# Patient Record
Sex: Male | Born: 1955 | Race: Black or African American | Hispanic: No | Marital: Single | Smoking: Current every day smoker
Health system: Southern US, Community
[De-identification: ages and names within clinical notes are randomized; demographics above are authoritative.]

## PROBLEM LIST (undated history)

## (undated) DIAGNOSIS — E119 Type 2 diabetes mellitus without complications: Secondary | ICD-10-CM

## (undated) DIAGNOSIS — N186 End stage renal disease: Secondary | ICD-10-CM

## (undated) DIAGNOSIS — I10 Essential (primary) hypertension: Secondary | ICD-10-CM

## (undated) DIAGNOSIS — N289 Disorder of kidney and ureter, unspecified: Secondary | ICD-10-CM

## (undated) HISTORY — PX: BACK SURGERY: SHX140

## (undated) HISTORY — PX: BELOW KNEE LEG AMPUTATION: SUR23

---

## 2019-01-07 DIAGNOSIS — M4624 Osteomyelitis of vertebra, thoracic region: Secondary | ICD-10-CM | POA: Diagnosis not present

## 2019-01-14 DIAGNOSIS — M4624 Osteomyelitis of vertebra, thoracic region: Secondary | ICD-10-CM | POA: Diagnosis not present

## 2019-01-21 DIAGNOSIS — M4624 Osteomyelitis of vertebra, thoracic region: Secondary | ICD-10-CM | POA: Diagnosis not present

## 2019-01-28 DIAGNOSIS — M4624 Osteomyelitis of vertebra, thoracic region: Secondary | ICD-10-CM | POA: Diagnosis not present

## 2019-01-30 DIAGNOSIS — M4624 Osteomyelitis of vertebra, thoracic region: Secondary | ICD-10-CM | POA: Diagnosis not present

## 2019-02-04 DIAGNOSIS — M4624 Osteomyelitis of vertebra, thoracic region: Secondary | ICD-10-CM | POA: Diagnosis not present

## 2019-02-11 DIAGNOSIS — M4624 Osteomyelitis of vertebra, thoracic region: Secondary | ICD-10-CM | POA: Diagnosis not present

## 2019-02-18 DIAGNOSIS — M4624 Osteomyelitis of vertebra, thoracic region: Secondary | ICD-10-CM | POA: Diagnosis not present

## 2019-02-25 DIAGNOSIS — M4624 Osteomyelitis of vertebra, thoracic region: Secondary | ICD-10-CM | POA: Diagnosis not present

## 2019-03-01 DIAGNOSIS — M4624 Osteomyelitis of vertebra, thoracic region: Secondary | ICD-10-CM | POA: Diagnosis not present

## 2019-03-11 DIAGNOSIS — M4624 Osteomyelitis of vertebra, thoracic region: Secondary | ICD-10-CM | POA: Diagnosis not present

## 2019-03-18 DIAGNOSIS — M4624 Osteomyelitis of vertebra, thoracic region: Secondary | ICD-10-CM | POA: Diagnosis not present

## 2019-03-25 DIAGNOSIS — M4624 Osteomyelitis of vertebra, thoracic region: Secondary | ICD-10-CM | POA: Diagnosis not present

## 2019-03-31 DIAGNOSIS — M4624 Osteomyelitis of vertebra, thoracic region: Secondary | ICD-10-CM | POA: Diagnosis not present

## 2019-04-06 DIAGNOSIS — M4624 Osteomyelitis of vertebra, thoracic region: Secondary | ICD-10-CM | POA: Diagnosis not present

## 2019-04-13 DIAGNOSIS — M4624 Osteomyelitis of vertebra, thoracic region: Secondary | ICD-10-CM | POA: Diagnosis not present

## 2019-04-22 DIAGNOSIS — M4624 Osteomyelitis of vertebra, thoracic region: Secondary | ICD-10-CM | POA: Diagnosis not present

## 2019-04-29 DIAGNOSIS — M4624 Osteomyelitis of vertebra, thoracic region: Secondary | ICD-10-CM | POA: Diagnosis not present

## 2019-05-06 DIAGNOSIS — M4624 Osteomyelitis of vertebra, thoracic region: Secondary | ICD-10-CM | POA: Diagnosis not present

## 2019-05-13 DIAGNOSIS — M4624 Osteomyelitis of vertebra, thoracic region: Secondary | ICD-10-CM | POA: Diagnosis not present

## 2019-05-20 DIAGNOSIS — M4624 Osteomyelitis of vertebra, thoracic region: Secondary | ICD-10-CM | POA: Diagnosis not present

## 2019-05-27 DIAGNOSIS — M4624 Osteomyelitis of vertebra, thoracic region: Secondary | ICD-10-CM | POA: Diagnosis not present

## 2019-05-31 DIAGNOSIS — M4624 Osteomyelitis of vertebra, thoracic region: Secondary | ICD-10-CM | POA: Diagnosis not present

## 2019-06-10 DIAGNOSIS — M4624 Osteomyelitis of vertebra, thoracic region: Secondary | ICD-10-CM | POA: Diagnosis not present

## 2019-06-17 DIAGNOSIS — M4624 Osteomyelitis of vertebra, thoracic region: Secondary | ICD-10-CM | POA: Diagnosis not present

## 2019-06-24 DIAGNOSIS — M4624 Osteomyelitis of vertebra, thoracic region: Secondary | ICD-10-CM | POA: Diagnosis not present

## 2019-08-13 DIAGNOSIS — Z4781 Encounter for orthopedic aftercare following surgical amputation: Secondary | ICD-10-CM | POA: Diagnosis not present

## 2019-08-19 DIAGNOSIS — Z4781 Encounter for orthopedic aftercare following surgical amputation: Secondary | ICD-10-CM | POA: Diagnosis not present

## 2019-08-26 DIAGNOSIS — Z4781 Encounter for orthopedic aftercare following surgical amputation: Secondary | ICD-10-CM | POA: Diagnosis not present

## 2019-08-31 DIAGNOSIS — Z4781 Encounter for orthopedic aftercare following surgical amputation: Secondary | ICD-10-CM | POA: Diagnosis not present

## 2019-09-09 DIAGNOSIS — Z4781 Encounter for orthopedic aftercare following surgical amputation: Secondary | ICD-10-CM | POA: Diagnosis not present

## 2019-09-16 DIAGNOSIS — Z4781 Encounter for orthopedic aftercare following surgical amputation: Secondary | ICD-10-CM | POA: Diagnosis not present

## 2019-09-23 DIAGNOSIS — Z4781 Encounter for orthopedic aftercare following surgical amputation: Secondary | ICD-10-CM | POA: Diagnosis not present

## 2019-09-30 DIAGNOSIS — Z4781 Encounter for orthopedic aftercare following surgical amputation: Secondary | ICD-10-CM | POA: Diagnosis not present

## 2019-10-07 DIAGNOSIS — Z4781 Encounter for orthopedic aftercare following surgical amputation: Secondary | ICD-10-CM | POA: Diagnosis not present

## 2019-10-14 DIAGNOSIS — Z4781 Encounter for orthopedic aftercare following surgical amputation: Secondary | ICD-10-CM | POA: Diagnosis not present

## 2019-10-21 DIAGNOSIS — Z4781 Encounter for orthopedic aftercare following surgical amputation: Secondary | ICD-10-CM | POA: Diagnosis not present

## 2019-10-28 DIAGNOSIS — Z4781 Encounter for orthopedic aftercare following surgical amputation: Secondary | ICD-10-CM | POA: Diagnosis not present

## 2019-10-31 DIAGNOSIS — Z4781 Encounter for orthopedic aftercare following surgical amputation: Secondary | ICD-10-CM | POA: Diagnosis not present

## 2019-11-04 DIAGNOSIS — Z4781 Encounter for orthopedic aftercare following surgical amputation: Secondary | ICD-10-CM | POA: Diagnosis not present

## 2019-11-11 DIAGNOSIS — Z4781 Encounter for orthopedic aftercare following surgical amputation: Secondary | ICD-10-CM | POA: Diagnosis not present

## 2019-12-05 DIAGNOSIS — I429 Cardiomyopathy, unspecified: Secondary | ICD-10-CM | POA: Diagnosis not present

## 2019-12-09 DIAGNOSIS — I429 Cardiomyopathy, unspecified: Secondary | ICD-10-CM | POA: Diagnosis not present

## 2019-12-16 DIAGNOSIS — I429 Cardiomyopathy, unspecified: Secondary | ICD-10-CM | POA: Diagnosis not present

## 2019-12-23 DIAGNOSIS — I429 Cardiomyopathy, unspecified: Secondary | ICD-10-CM | POA: Diagnosis not present

## 2019-12-23 DIAGNOSIS — J449 Chronic obstructive pulmonary disease, unspecified: Secondary | ICD-10-CM | POA: Diagnosis not present

## 2019-12-23 DIAGNOSIS — M5000 Cervical disc disorder with myelopathy, unspecified cervical region: Secondary | ICD-10-CM | POA: Diagnosis not present

## 2019-12-23 DIAGNOSIS — K920 Hematemesis: Secondary | ICD-10-CM | POA: Diagnosis not present

## 2019-12-23 DIAGNOSIS — K922 Gastrointestinal hemorrhage, unspecified: Secondary | ICD-10-CM | POA: Diagnosis not present

## 2019-12-31 DIAGNOSIS — J449 Chronic obstructive pulmonary disease, unspecified: Secondary | ICD-10-CM | POA: Diagnosis not present

## 2019-12-31 DIAGNOSIS — K920 Hematemesis: Secondary | ICD-10-CM | POA: Diagnosis not present

## 2019-12-31 DIAGNOSIS — K922 Gastrointestinal hemorrhage, unspecified: Secondary | ICD-10-CM | POA: Diagnosis not present

## 2019-12-31 DIAGNOSIS — I429 Cardiomyopathy, unspecified: Secondary | ICD-10-CM | POA: Diagnosis not present

## 2019-12-31 DIAGNOSIS — M5000 Cervical disc disorder with myelopathy, unspecified cervical region: Secondary | ICD-10-CM | POA: Diagnosis not present

## 2020-01-13 DIAGNOSIS — K920 Hematemesis: Secondary | ICD-10-CM | POA: Diagnosis not present

## 2020-01-13 DIAGNOSIS — I429 Cardiomyopathy, unspecified: Secondary | ICD-10-CM | POA: Diagnosis not present

## 2020-01-13 DIAGNOSIS — M5000 Cervical disc disorder with myelopathy, unspecified cervical region: Secondary | ICD-10-CM | POA: Diagnosis not present

## 2020-01-13 DIAGNOSIS — J449 Chronic obstructive pulmonary disease, unspecified: Secondary | ICD-10-CM | POA: Diagnosis not present

## 2020-01-13 DIAGNOSIS — K922 Gastrointestinal hemorrhage, unspecified: Secondary | ICD-10-CM | POA: Diagnosis not present

## 2020-02-10 DIAGNOSIS — J449 Chronic obstructive pulmonary disease, unspecified: Secondary | ICD-10-CM | POA: Diagnosis not present

## 2020-02-10 DIAGNOSIS — M5000 Cervical disc disorder with myelopathy, unspecified cervical region: Secondary | ICD-10-CM | POA: Diagnosis not present

## 2020-02-10 DIAGNOSIS — I429 Cardiomyopathy, unspecified: Secondary | ICD-10-CM | POA: Diagnosis not present

## 2020-02-10 DIAGNOSIS — K920 Hematemesis: Secondary | ICD-10-CM | POA: Diagnosis not present

## 2020-02-10 DIAGNOSIS — K922 Gastrointestinal hemorrhage, unspecified: Secondary | ICD-10-CM | POA: Diagnosis not present

## 2020-02-15 DIAGNOSIS — E1165 Type 2 diabetes mellitus with hyperglycemia: Secondary | ICD-10-CM | POA: Diagnosis not present

## 2020-02-15 DIAGNOSIS — R0689 Other abnormalities of breathing: Secondary | ICD-10-CM | POA: Diagnosis not present

## 2020-02-15 DIAGNOSIS — Z743 Need for continuous supervision: Secondary | ICD-10-CM | POA: Diagnosis not present

## 2020-02-15 DIAGNOSIS — R52 Pain, unspecified: Secondary | ICD-10-CM | POA: Diagnosis not present

## 2020-02-15 DIAGNOSIS — R4020 Unspecified coma: Secondary | ICD-10-CM | POA: Diagnosis not present

## 2020-02-21 DIAGNOSIS — I429 Cardiomyopathy, unspecified: Secondary | ICD-10-CM | POA: Diagnosis not present

## 2020-02-24 DIAGNOSIS — I429 Cardiomyopathy, unspecified: Secondary | ICD-10-CM | POA: Diagnosis not present

## 2020-02-29 DIAGNOSIS — E1142 Type 2 diabetes mellitus with diabetic polyneuropathy: Secondary | ICD-10-CM | POA: Diagnosis not present

## 2020-02-29 DIAGNOSIS — K861 Other chronic pancreatitis: Secondary | ICD-10-CM | POA: Diagnosis not present

## 2020-02-29 DIAGNOSIS — E1169 Type 2 diabetes mellitus with other specified complication: Secondary | ICD-10-CM | POA: Diagnosis not present

## 2020-02-29 DIAGNOSIS — Z992 Dependence on renal dialysis: Secondary | ICD-10-CM | POA: Diagnosis not present

## 2020-02-29 DIAGNOSIS — E1122 Type 2 diabetes mellitus with diabetic chronic kidney disease: Secondary | ICD-10-CM | POA: Diagnosis not present

## 2020-02-29 DIAGNOSIS — I132 Hypertensive heart and chronic kidney disease with heart failure and with stage 5 chronic kidney disease, or end stage renal disease: Secondary | ICD-10-CM | POA: Diagnosis not present

## 2020-02-29 DIAGNOSIS — D631 Anemia in chronic kidney disease: Secondary | ICD-10-CM | POA: Diagnosis not present

## 2020-02-29 DIAGNOSIS — J449 Chronic obstructive pulmonary disease, unspecified: Secondary | ICD-10-CM | POA: Diagnosis not present

## 2020-02-29 DIAGNOSIS — N186 End stage renal disease: Secondary | ICD-10-CM | POA: Diagnosis not present

## 2020-02-29 DIAGNOSIS — L97829 Non-pressure chronic ulcer of other part of left lower leg with unspecified severity: Secondary | ICD-10-CM | POA: Diagnosis not present

## 2020-02-29 DIAGNOSIS — F17210 Nicotine dependence, cigarettes, uncomplicated: Secondary | ICD-10-CM | POA: Diagnosis not present

## 2020-02-29 DIAGNOSIS — I428 Other cardiomyopathies: Secondary | ICD-10-CM | POA: Diagnosis not present

## 2020-02-29 DIAGNOSIS — I5042 Chronic combined systolic (congestive) and diastolic (congestive) heart failure: Secondary | ICD-10-CM | POA: Diagnosis not present

## 2020-02-29 DIAGNOSIS — E1165 Type 2 diabetes mellitus with hyperglycemia: Secondary | ICD-10-CM | POA: Diagnosis not present

## 2020-02-29 DIAGNOSIS — L97819 Non-pressure chronic ulcer of other part of right lower leg with unspecified severity: Secondary | ICD-10-CM | POA: Diagnosis not present

## 2020-02-29 DIAGNOSIS — N250 Renal osteodystrophy: Secondary | ICD-10-CM | POA: Diagnosis not present

## 2020-02-29 DIAGNOSIS — E872 Acidosis: Secondary | ICD-10-CM | POA: Diagnosis not present

## 2020-02-29 DIAGNOSIS — K219 Gastro-esophageal reflux disease without esophagitis: Secondary | ICD-10-CM | POA: Diagnosis not present

## 2020-02-29 DIAGNOSIS — M1990 Unspecified osteoarthritis, unspecified site: Secondary | ICD-10-CM | POA: Diagnosis not present

## 2020-02-29 DIAGNOSIS — E871 Hypo-osmolality and hyponatremia: Secondary | ICD-10-CM | POA: Diagnosis not present

## 2020-02-29 DIAGNOSIS — N2581 Secondary hyperparathyroidism of renal origin: Secondary | ICD-10-CM | POA: Diagnosis not present

## 2020-02-29 DIAGNOSIS — Z20822 Contact with and (suspected) exposure to covid-19: Secondary | ICD-10-CM | POA: Diagnosis not present

## 2020-02-29 DIAGNOSIS — I739 Peripheral vascular disease, unspecified: Secondary | ICD-10-CM | POA: Diagnosis not present

## 2020-02-29 DIAGNOSIS — I2510 Atherosclerotic heart disease of native coronary artery without angina pectoris: Secondary | ICD-10-CM | POA: Diagnosis not present

## 2020-02-29 DIAGNOSIS — G894 Chronic pain syndrome: Secondary | ICD-10-CM | POA: Diagnosis not present

## 2020-02-29 DIAGNOSIS — E1151 Type 2 diabetes mellitus with diabetic peripheral angiopathy without gangrene: Secondary | ICD-10-CM | POA: Diagnosis not present

## 2020-04-05 DIAGNOSIS — D509 Iron deficiency anemia, unspecified: Secondary | ICD-10-CM | POA: Diagnosis not present

## 2020-04-05 DIAGNOSIS — E8779 Other fluid overload: Secondary | ICD-10-CM | POA: Diagnosis not present

## 2020-04-05 DIAGNOSIS — D631 Anemia in chronic kidney disease: Secondary | ICD-10-CM | POA: Diagnosis not present

## 2020-04-05 DIAGNOSIS — N186 End stage renal disease: Secondary | ICD-10-CM | POA: Diagnosis not present

## 2020-04-05 DIAGNOSIS — N2581 Secondary hyperparathyroidism of renal origin: Secondary | ICD-10-CM | POA: Diagnosis not present

## 2020-04-07 DIAGNOSIS — N186 End stage renal disease: Secondary | ICD-10-CM | POA: Diagnosis not present

## 2020-04-07 DIAGNOSIS — D631 Anemia in chronic kidney disease: Secondary | ICD-10-CM | POA: Diagnosis not present

## 2020-04-07 DIAGNOSIS — N2581 Secondary hyperparathyroidism of renal origin: Secondary | ICD-10-CM | POA: Diagnosis not present

## 2020-04-07 DIAGNOSIS — E8779 Other fluid overload: Secondary | ICD-10-CM | POA: Diagnosis not present

## 2020-04-07 DIAGNOSIS — D509 Iron deficiency anemia, unspecified: Secondary | ICD-10-CM | POA: Diagnosis not present

## 2020-04-10 DIAGNOSIS — D631 Anemia in chronic kidney disease: Secondary | ICD-10-CM | POA: Diagnosis not present

## 2020-04-10 DIAGNOSIS — N2581 Secondary hyperparathyroidism of renal origin: Secondary | ICD-10-CM | POA: Diagnosis not present

## 2020-04-10 DIAGNOSIS — E8779 Other fluid overload: Secondary | ICD-10-CM | POA: Diagnosis not present

## 2020-04-10 DIAGNOSIS — D509 Iron deficiency anemia, unspecified: Secondary | ICD-10-CM | POA: Diagnosis not present

## 2020-04-10 DIAGNOSIS — N186 End stage renal disease: Secondary | ICD-10-CM | POA: Diagnosis not present

## 2020-04-12 DIAGNOSIS — D631 Anemia in chronic kidney disease: Secondary | ICD-10-CM | POA: Diagnosis not present

## 2020-04-12 DIAGNOSIS — D509 Iron deficiency anemia, unspecified: Secondary | ICD-10-CM | POA: Diagnosis not present

## 2020-04-12 DIAGNOSIS — N2581 Secondary hyperparathyroidism of renal origin: Secondary | ICD-10-CM | POA: Diagnosis not present

## 2020-04-12 DIAGNOSIS — E8779 Other fluid overload: Secondary | ICD-10-CM | POA: Diagnosis not present

## 2020-04-12 DIAGNOSIS — N186 End stage renal disease: Secondary | ICD-10-CM | POA: Diagnosis not present

## 2020-04-13 DIAGNOSIS — N186 End stage renal disease: Secondary | ICD-10-CM | POA: Diagnosis not present

## 2020-04-13 DIAGNOSIS — N2581 Secondary hyperparathyroidism of renal origin: Secondary | ICD-10-CM | POA: Diagnosis not present

## 2020-04-13 DIAGNOSIS — D509 Iron deficiency anemia, unspecified: Secondary | ICD-10-CM | POA: Diagnosis not present

## 2020-04-13 DIAGNOSIS — D631 Anemia in chronic kidney disease: Secondary | ICD-10-CM | POA: Diagnosis not present

## 2020-04-13 DIAGNOSIS — E8779 Other fluid overload: Secondary | ICD-10-CM | POA: Diagnosis not present

## 2020-04-14 DIAGNOSIS — D509 Iron deficiency anemia, unspecified: Secondary | ICD-10-CM | POA: Diagnosis not present

## 2020-04-14 DIAGNOSIS — N2581 Secondary hyperparathyroidism of renal origin: Secondary | ICD-10-CM | POA: Diagnosis not present

## 2020-04-14 DIAGNOSIS — N186 End stage renal disease: Secondary | ICD-10-CM | POA: Diagnosis not present

## 2020-04-14 DIAGNOSIS — E8779 Other fluid overload: Secondary | ICD-10-CM | POA: Diagnosis not present

## 2020-04-14 DIAGNOSIS — D631 Anemia in chronic kidney disease: Secondary | ICD-10-CM | POA: Diagnosis not present

## 2020-04-17 DIAGNOSIS — N186 End stage renal disease: Secondary | ICD-10-CM | POA: Diagnosis not present

## 2020-04-17 DIAGNOSIS — N2581 Secondary hyperparathyroidism of renal origin: Secondary | ICD-10-CM | POA: Diagnosis not present

## 2020-04-17 DIAGNOSIS — D631 Anemia in chronic kidney disease: Secondary | ICD-10-CM | POA: Diagnosis not present

## 2020-04-17 DIAGNOSIS — E8779 Other fluid overload: Secondary | ICD-10-CM | POA: Diagnosis not present

## 2020-04-17 DIAGNOSIS — D509 Iron deficiency anemia, unspecified: Secondary | ICD-10-CM | POA: Diagnosis not present

## 2020-04-19 DIAGNOSIS — N2581 Secondary hyperparathyroidism of renal origin: Secondary | ICD-10-CM | POA: Diagnosis not present

## 2020-04-19 DIAGNOSIS — N186 End stage renal disease: Secondary | ICD-10-CM | POA: Diagnosis not present

## 2020-04-19 DIAGNOSIS — D631 Anemia in chronic kidney disease: Secondary | ICD-10-CM | POA: Diagnosis not present

## 2020-04-19 DIAGNOSIS — D509 Iron deficiency anemia, unspecified: Secondary | ICD-10-CM | POA: Diagnosis not present

## 2020-04-19 DIAGNOSIS — E8779 Other fluid overload: Secondary | ICD-10-CM | POA: Diagnosis not present

## 2020-04-21 DIAGNOSIS — N186 End stage renal disease: Secondary | ICD-10-CM | POA: Diagnosis not present

## 2020-04-21 DIAGNOSIS — E8779 Other fluid overload: Secondary | ICD-10-CM | POA: Diagnosis not present

## 2020-04-21 DIAGNOSIS — D509 Iron deficiency anemia, unspecified: Secondary | ICD-10-CM | POA: Diagnosis not present

## 2020-04-21 DIAGNOSIS — D631 Anemia in chronic kidney disease: Secondary | ICD-10-CM | POA: Diagnosis not present

## 2020-04-21 DIAGNOSIS — N2581 Secondary hyperparathyroidism of renal origin: Secondary | ICD-10-CM | POA: Diagnosis not present

## 2020-04-24 DIAGNOSIS — N186 End stage renal disease: Secondary | ICD-10-CM | POA: Diagnosis not present

## 2020-04-24 DIAGNOSIS — N2581 Secondary hyperparathyroidism of renal origin: Secondary | ICD-10-CM | POA: Diagnosis not present

## 2020-04-24 DIAGNOSIS — E8779 Other fluid overload: Secondary | ICD-10-CM | POA: Diagnosis not present

## 2020-04-24 DIAGNOSIS — D631 Anemia in chronic kidney disease: Secondary | ICD-10-CM | POA: Diagnosis not present

## 2020-04-24 DIAGNOSIS — D509 Iron deficiency anemia, unspecified: Secondary | ICD-10-CM | POA: Diagnosis not present

## 2020-04-26 DIAGNOSIS — N186 End stage renal disease: Secondary | ICD-10-CM | POA: Diagnosis not present

## 2020-04-26 DIAGNOSIS — D631 Anemia in chronic kidney disease: Secondary | ICD-10-CM | POA: Diagnosis not present

## 2020-04-26 DIAGNOSIS — N2581 Secondary hyperparathyroidism of renal origin: Secondary | ICD-10-CM | POA: Diagnosis not present

## 2020-04-26 DIAGNOSIS — D509 Iron deficiency anemia, unspecified: Secondary | ICD-10-CM | POA: Diagnosis not present

## 2020-04-26 DIAGNOSIS — E8779 Other fluid overload: Secondary | ICD-10-CM | POA: Diagnosis not present

## 2020-04-28 DIAGNOSIS — D509 Iron deficiency anemia, unspecified: Secondary | ICD-10-CM | POA: Diagnosis not present

## 2020-04-28 DIAGNOSIS — E8779 Other fluid overload: Secondary | ICD-10-CM | POA: Diagnosis not present

## 2020-04-28 DIAGNOSIS — D631 Anemia in chronic kidney disease: Secondary | ICD-10-CM | POA: Diagnosis not present

## 2020-04-28 DIAGNOSIS — N2581 Secondary hyperparathyroidism of renal origin: Secondary | ICD-10-CM | POA: Diagnosis not present

## 2020-04-28 DIAGNOSIS — N186 End stage renal disease: Secondary | ICD-10-CM | POA: Diagnosis not present

## 2020-05-01 DIAGNOSIS — N186 End stage renal disease: Secondary | ICD-10-CM | POA: Diagnosis not present

## 2020-05-01 DIAGNOSIS — D631 Anemia in chronic kidney disease: Secondary | ICD-10-CM | POA: Diagnosis not present

## 2020-05-01 DIAGNOSIS — E8779 Other fluid overload: Secondary | ICD-10-CM | POA: Diagnosis not present

## 2020-05-01 DIAGNOSIS — D509 Iron deficiency anemia, unspecified: Secondary | ICD-10-CM | POA: Diagnosis not present

## 2020-05-01 DIAGNOSIS — N2581 Secondary hyperparathyroidism of renal origin: Secondary | ICD-10-CM | POA: Diagnosis not present

## 2020-05-03 DIAGNOSIS — N2581 Secondary hyperparathyroidism of renal origin: Secondary | ICD-10-CM | POA: Diagnosis not present

## 2020-05-03 DIAGNOSIS — N186 End stage renal disease: Secondary | ICD-10-CM | POA: Diagnosis not present

## 2020-05-03 DIAGNOSIS — D509 Iron deficiency anemia, unspecified: Secondary | ICD-10-CM | POA: Diagnosis not present

## 2020-05-03 DIAGNOSIS — E8779 Other fluid overload: Secondary | ICD-10-CM | POA: Diagnosis not present

## 2020-05-03 DIAGNOSIS — D631 Anemia in chronic kidney disease: Secondary | ICD-10-CM | POA: Diagnosis not present

## 2020-05-05 DIAGNOSIS — N186 End stage renal disease: Secondary | ICD-10-CM | POA: Diagnosis not present

## 2020-05-05 DIAGNOSIS — D509 Iron deficiency anemia, unspecified: Secondary | ICD-10-CM | POA: Diagnosis not present

## 2020-05-05 DIAGNOSIS — N2581 Secondary hyperparathyroidism of renal origin: Secondary | ICD-10-CM | POA: Diagnosis not present

## 2020-05-05 DIAGNOSIS — D631 Anemia in chronic kidney disease: Secondary | ICD-10-CM | POA: Diagnosis not present

## 2020-05-05 DIAGNOSIS — E8779 Other fluid overload: Secondary | ICD-10-CM | POA: Diagnosis not present

## 2020-05-07 DIAGNOSIS — R251 Tremor, unspecified: Secondary | ICD-10-CM | POA: Diagnosis not present

## 2020-05-07 DIAGNOSIS — Z4781 Encounter for orthopedic aftercare following surgical amputation: Secondary | ICD-10-CM | POA: Diagnosis not present

## 2020-05-07 DIAGNOSIS — I2510 Atherosclerotic heart disease of native coronary artery without angina pectoris: Secondary | ICD-10-CM | POA: Diagnosis not present

## 2020-05-07 DIAGNOSIS — D631 Anemia in chronic kidney disease: Secondary | ICD-10-CM | POA: Diagnosis not present

## 2020-05-07 DIAGNOSIS — J449 Chronic obstructive pulmonary disease, unspecified: Secondary | ICD-10-CM | POA: Diagnosis not present

## 2020-05-07 DIAGNOSIS — G4733 Obstructive sleep apnea (adult) (pediatric): Secondary | ICD-10-CM | POA: Diagnosis not present

## 2020-05-07 DIAGNOSIS — E1122 Type 2 diabetes mellitus with diabetic chronic kidney disease: Secondary | ICD-10-CM | POA: Diagnosis not present

## 2020-05-07 DIAGNOSIS — R262 Difficulty in walking, not elsewhere classified: Secondary | ICD-10-CM | POA: Diagnosis not present

## 2020-05-07 DIAGNOSIS — G894 Chronic pain syndrome: Secondary | ICD-10-CM | POA: Diagnosis not present

## 2020-05-07 DIAGNOSIS — E211 Secondary hyperparathyroidism, not elsewhere classified: Secondary | ICD-10-CM | POA: Diagnosis not present

## 2020-05-07 DIAGNOSIS — I219 Acute myocardial infarction, unspecified: Secondary | ICD-10-CM | POA: Diagnosis not present

## 2020-05-07 DIAGNOSIS — M24541 Contracture, right hand: Secondary | ICD-10-CM | POA: Diagnosis not present

## 2020-05-07 DIAGNOSIS — M6281 Muscle weakness (generalized): Secondary | ICD-10-CM | POA: Diagnosis not present

## 2020-05-07 DIAGNOSIS — M24542 Contracture, left hand: Secondary | ICD-10-CM | POA: Diagnosis not present

## 2020-05-07 DIAGNOSIS — E1165 Type 2 diabetes mellitus with hyperglycemia: Secondary | ICD-10-CM | POA: Diagnosis not present

## 2020-05-07 DIAGNOSIS — E1140 Type 2 diabetes mellitus with diabetic neuropathy, unspecified: Secondary | ICD-10-CM | POA: Diagnosis not present

## 2020-05-08 DIAGNOSIS — N186 End stage renal disease: Secondary | ICD-10-CM | POA: Diagnosis not present

## 2020-05-08 DIAGNOSIS — M6281 Muscle weakness (generalized): Secondary | ICD-10-CM | POA: Diagnosis not present

## 2020-05-08 DIAGNOSIS — E8779 Other fluid overload: Secondary | ICD-10-CM | POA: Diagnosis not present

## 2020-05-08 DIAGNOSIS — N2581 Secondary hyperparathyroidism of renal origin: Secondary | ICD-10-CM | POA: Diagnosis not present

## 2020-05-08 DIAGNOSIS — I219 Acute myocardial infarction, unspecified: Secondary | ICD-10-CM | POA: Diagnosis not present

## 2020-05-08 DIAGNOSIS — R262 Difficulty in walking, not elsewhere classified: Secondary | ICD-10-CM | POA: Diagnosis not present

## 2020-05-08 DIAGNOSIS — J449 Chronic obstructive pulmonary disease, unspecified: Secondary | ICD-10-CM | POA: Diagnosis not present

## 2020-05-08 DIAGNOSIS — M24542 Contracture, left hand: Secondary | ICD-10-CM | POA: Diagnosis not present

## 2020-05-08 DIAGNOSIS — D509 Iron deficiency anemia, unspecified: Secondary | ICD-10-CM | POA: Diagnosis not present

## 2020-05-08 DIAGNOSIS — Z4781 Encounter for orthopedic aftercare following surgical amputation: Secondary | ICD-10-CM | POA: Diagnosis not present

## 2020-05-08 DIAGNOSIS — M24541 Contracture, right hand: Secondary | ICD-10-CM | POA: Diagnosis not present

## 2020-05-08 DIAGNOSIS — D631 Anemia in chronic kidney disease: Secondary | ICD-10-CM | POA: Diagnosis not present

## 2020-05-09 DIAGNOSIS — M6281 Muscle weakness (generalized): Secondary | ICD-10-CM | POA: Diagnosis not present

## 2020-05-09 DIAGNOSIS — R262 Difficulty in walking, not elsewhere classified: Secondary | ICD-10-CM | POA: Diagnosis not present

## 2020-05-09 DIAGNOSIS — M24541 Contracture, right hand: Secondary | ICD-10-CM | POA: Diagnosis not present

## 2020-05-09 DIAGNOSIS — I739 Peripheral vascular disease, unspecified: Secondary | ICD-10-CM | POA: Diagnosis not present

## 2020-05-09 DIAGNOSIS — I219 Acute myocardial infarction, unspecified: Secondary | ICD-10-CM | POA: Diagnosis not present

## 2020-05-09 DIAGNOSIS — N186 End stage renal disease: Secondary | ICD-10-CM | POA: Diagnosis not present

## 2020-05-09 DIAGNOSIS — Z4781 Encounter for orthopedic aftercare following surgical amputation: Secondary | ICD-10-CM | POA: Diagnosis not present

## 2020-05-09 DIAGNOSIS — E1122 Type 2 diabetes mellitus with diabetic chronic kidney disease: Secondary | ICD-10-CM | POA: Diagnosis not present

## 2020-05-09 DIAGNOSIS — J449 Chronic obstructive pulmonary disease, unspecified: Secondary | ICD-10-CM | POA: Diagnosis not present

## 2020-05-09 DIAGNOSIS — Z992 Dependence on renal dialysis: Secondary | ICD-10-CM | POA: Diagnosis not present

## 2020-05-09 DIAGNOSIS — M24542 Contracture, left hand: Secondary | ICD-10-CM | POA: Diagnosis not present

## 2020-05-10 DIAGNOSIS — M24542 Contracture, left hand: Secondary | ICD-10-CM | POA: Diagnosis not present

## 2020-05-10 DIAGNOSIS — D509 Iron deficiency anemia, unspecified: Secondary | ICD-10-CM | POA: Diagnosis not present

## 2020-05-10 DIAGNOSIS — Z4781 Encounter for orthopedic aftercare following surgical amputation: Secondary | ICD-10-CM | POA: Diagnosis not present

## 2020-05-10 DIAGNOSIS — J449 Chronic obstructive pulmonary disease, unspecified: Secondary | ICD-10-CM | POA: Diagnosis not present

## 2020-05-10 DIAGNOSIS — N186 End stage renal disease: Secondary | ICD-10-CM | POA: Diagnosis not present

## 2020-05-10 DIAGNOSIS — M24541 Contracture, right hand: Secondary | ICD-10-CM | POA: Diagnosis not present

## 2020-05-10 DIAGNOSIS — E8779 Other fluid overload: Secondary | ICD-10-CM | POA: Diagnosis not present

## 2020-05-10 DIAGNOSIS — R262 Difficulty in walking, not elsewhere classified: Secondary | ICD-10-CM | POA: Diagnosis not present

## 2020-05-10 DIAGNOSIS — N2581 Secondary hyperparathyroidism of renal origin: Secondary | ICD-10-CM | POA: Diagnosis not present

## 2020-05-10 DIAGNOSIS — D631 Anemia in chronic kidney disease: Secondary | ICD-10-CM | POA: Diagnosis not present

## 2020-05-10 DIAGNOSIS — I219 Acute myocardial infarction, unspecified: Secondary | ICD-10-CM | POA: Diagnosis not present

## 2020-05-10 DIAGNOSIS — M6281 Muscle weakness (generalized): Secondary | ICD-10-CM | POA: Diagnosis not present

## 2020-05-11 DIAGNOSIS — Z4781 Encounter for orthopedic aftercare following surgical amputation: Secondary | ICD-10-CM | POA: Diagnosis not present

## 2020-05-11 DIAGNOSIS — I2510 Atherosclerotic heart disease of native coronary artery without angina pectoris: Secondary | ICD-10-CM | POA: Diagnosis not present

## 2020-05-11 DIAGNOSIS — G894 Chronic pain syndrome: Secondary | ICD-10-CM | POA: Diagnosis not present

## 2020-05-11 DIAGNOSIS — E1122 Type 2 diabetes mellitus with diabetic chronic kidney disease: Secondary | ICD-10-CM | POA: Diagnosis not present

## 2020-05-11 DIAGNOSIS — M6281 Muscle weakness (generalized): Secondary | ICD-10-CM | POA: Diagnosis not present

## 2020-05-11 DIAGNOSIS — E1140 Type 2 diabetes mellitus with diabetic neuropathy, unspecified: Secondary | ICD-10-CM | POA: Diagnosis not present

## 2020-05-11 DIAGNOSIS — G4733 Obstructive sleep apnea (adult) (pediatric): Secondary | ICD-10-CM | POA: Diagnosis not present

## 2020-05-11 DIAGNOSIS — E211 Secondary hyperparathyroidism, not elsewhere classified: Secondary | ICD-10-CM | POA: Diagnosis not present

## 2020-05-11 DIAGNOSIS — J449 Chronic obstructive pulmonary disease, unspecified: Secondary | ICD-10-CM | POA: Diagnosis not present

## 2020-05-11 DIAGNOSIS — R262 Difficulty in walking, not elsewhere classified: Secondary | ICD-10-CM | POA: Diagnosis not present

## 2020-05-11 DIAGNOSIS — E8779 Other fluid overload: Secondary | ICD-10-CM | POA: Diagnosis not present

## 2020-05-11 DIAGNOSIS — M24541 Contracture, right hand: Secondary | ICD-10-CM | POA: Diagnosis not present

## 2020-05-11 DIAGNOSIS — R251 Tremor, unspecified: Secondary | ICD-10-CM | POA: Diagnosis not present

## 2020-05-11 DIAGNOSIS — D509 Iron deficiency anemia, unspecified: Secondary | ICD-10-CM | POA: Diagnosis not present

## 2020-05-11 DIAGNOSIS — M24542 Contracture, left hand: Secondary | ICD-10-CM | POA: Diagnosis not present

## 2020-05-11 DIAGNOSIS — N2581 Secondary hyperparathyroidism of renal origin: Secondary | ICD-10-CM | POA: Diagnosis not present

## 2020-05-11 DIAGNOSIS — D631 Anemia in chronic kidney disease: Secondary | ICD-10-CM | POA: Diagnosis not present

## 2020-05-11 DIAGNOSIS — E1165 Type 2 diabetes mellitus with hyperglycemia: Secondary | ICD-10-CM | POA: Diagnosis not present

## 2020-05-11 DIAGNOSIS — I219 Acute myocardial infarction, unspecified: Secondary | ICD-10-CM | POA: Diagnosis not present

## 2020-05-11 DIAGNOSIS — N186 End stage renal disease: Secondary | ICD-10-CM | POA: Diagnosis not present

## 2020-05-12 DIAGNOSIS — E8779 Other fluid overload: Secondary | ICD-10-CM | POA: Diagnosis not present

## 2020-05-12 DIAGNOSIS — M24542 Contracture, left hand: Secondary | ICD-10-CM | POA: Diagnosis not present

## 2020-05-12 DIAGNOSIS — D631 Anemia in chronic kidney disease: Secondary | ICD-10-CM | POA: Diagnosis not present

## 2020-05-12 DIAGNOSIS — N186 End stage renal disease: Secondary | ICD-10-CM | POA: Diagnosis not present

## 2020-05-12 DIAGNOSIS — M6281 Muscle weakness (generalized): Secondary | ICD-10-CM | POA: Diagnosis not present

## 2020-05-12 DIAGNOSIS — J449 Chronic obstructive pulmonary disease, unspecified: Secondary | ICD-10-CM | POA: Diagnosis not present

## 2020-05-12 DIAGNOSIS — D509 Iron deficiency anemia, unspecified: Secondary | ICD-10-CM | POA: Diagnosis not present

## 2020-05-12 DIAGNOSIS — M24541 Contracture, right hand: Secondary | ICD-10-CM | POA: Diagnosis not present

## 2020-05-12 DIAGNOSIS — N2581 Secondary hyperparathyroidism of renal origin: Secondary | ICD-10-CM | POA: Diagnosis not present

## 2020-05-12 DIAGNOSIS — I219 Acute myocardial infarction, unspecified: Secondary | ICD-10-CM | POA: Diagnosis not present

## 2020-05-12 DIAGNOSIS — Z4781 Encounter for orthopedic aftercare following surgical amputation: Secondary | ICD-10-CM | POA: Diagnosis not present

## 2020-05-12 DIAGNOSIS — R262 Difficulty in walking, not elsewhere classified: Secondary | ICD-10-CM | POA: Diagnosis not present

## 2020-05-15 DIAGNOSIS — M24542 Contracture, left hand: Secondary | ICD-10-CM | POA: Diagnosis not present

## 2020-05-15 DIAGNOSIS — D631 Anemia in chronic kidney disease: Secondary | ICD-10-CM | POA: Diagnosis not present

## 2020-05-15 DIAGNOSIS — M24541 Contracture, right hand: Secondary | ICD-10-CM | POA: Diagnosis not present

## 2020-05-15 DIAGNOSIS — R262 Difficulty in walking, not elsewhere classified: Secondary | ICD-10-CM | POA: Diagnosis not present

## 2020-05-15 DIAGNOSIS — N2581 Secondary hyperparathyroidism of renal origin: Secondary | ICD-10-CM | POA: Diagnosis not present

## 2020-05-15 DIAGNOSIS — I219 Acute myocardial infarction, unspecified: Secondary | ICD-10-CM | POA: Diagnosis not present

## 2020-05-15 DIAGNOSIS — E8779 Other fluid overload: Secondary | ICD-10-CM | POA: Diagnosis not present

## 2020-05-15 DIAGNOSIS — Z4781 Encounter for orthopedic aftercare following surgical amputation: Secondary | ICD-10-CM | POA: Diagnosis not present

## 2020-05-15 DIAGNOSIS — M6281 Muscle weakness (generalized): Secondary | ICD-10-CM | POA: Diagnosis not present

## 2020-05-15 DIAGNOSIS — D509 Iron deficiency anemia, unspecified: Secondary | ICD-10-CM | POA: Diagnosis not present

## 2020-05-15 DIAGNOSIS — N186 End stage renal disease: Secondary | ICD-10-CM | POA: Diagnosis not present

## 2020-05-15 DIAGNOSIS — J449 Chronic obstructive pulmonary disease, unspecified: Secondary | ICD-10-CM | POA: Diagnosis not present

## 2020-05-16 DIAGNOSIS — M24542 Contracture, left hand: Secondary | ICD-10-CM | POA: Diagnosis not present

## 2020-05-16 DIAGNOSIS — M6281 Muscle weakness (generalized): Secondary | ICD-10-CM | POA: Diagnosis not present

## 2020-05-16 DIAGNOSIS — R262 Difficulty in walking, not elsewhere classified: Secondary | ICD-10-CM | POA: Diagnosis not present

## 2020-05-16 DIAGNOSIS — J449 Chronic obstructive pulmonary disease, unspecified: Secondary | ICD-10-CM | POA: Diagnosis not present

## 2020-05-16 DIAGNOSIS — Z4781 Encounter for orthopedic aftercare following surgical amputation: Secondary | ICD-10-CM | POA: Diagnosis not present

## 2020-05-16 DIAGNOSIS — I219 Acute myocardial infarction, unspecified: Secondary | ICD-10-CM | POA: Diagnosis not present

## 2020-05-16 DIAGNOSIS — M24541 Contracture, right hand: Secondary | ICD-10-CM | POA: Diagnosis not present

## 2020-05-17 DIAGNOSIS — J449 Chronic obstructive pulmonary disease, unspecified: Secondary | ICD-10-CM | POA: Diagnosis not present

## 2020-05-17 DIAGNOSIS — R262 Difficulty in walking, not elsewhere classified: Secondary | ICD-10-CM | POA: Diagnosis not present

## 2020-05-17 DIAGNOSIS — N186 End stage renal disease: Secondary | ICD-10-CM | POA: Diagnosis not present

## 2020-05-17 DIAGNOSIS — M6281 Muscle weakness (generalized): Secondary | ICD-10-CM | POA: Diagnosis not present

## 2020-05-17 DIAGNOSIS — I219 Acute myocardial infarction, unspecified: Secondary | ICD-10-CM | POA: Diagnosis not present

## 2020-05-17 DIAGNOSIS — E8779 Other fluid overload: Secondary | ICD-10-CM | POA: Diagnosis not present

## 2020-05-17 DIAGNOSIS — D509 Iron deficiency anemia, unspecified: Secondary | ICD-10-CM | POA: Diagnosis not present

## 2020-05-17 DIAGNOSIS — M24542 Contracture, left hand: Secondary | ICD-10-CM | POA: Diagnosis not present

## 2020-05-17 DIAGNOSIS — M24541 Contracture, right hand: Secondary | ICD-10-CM | POA: Diagnosis not present

## 2020-05-17 DIAGNOSIS — Z4781 Encounter for orthopedic aftercare following surgical amputation: Secondary | ICD-10-CM | POA: Diagnosis not present

## 2020-05-17 DIAGNOSIS — D631 Anemia in chronic kidney disease: Secondary | ICD-10-CM | POA: Diagnosis not present

## 2020-05-17 DIAGNOSIS — N2581 Secondary hyperparathyroidism of renal origin: Secondary | ICD-10-CM | POA: Diagnosis not present

## 2020-05-18 DIAGNOSIS — E211 Secondary hyperparathyroidism, not elsewhere classified: Secondary | ICD-10-CM | POA: Diagnosis not present

## 2020-05-18 DIAGNOSIS — M24542 Contracture, left hand: Secondary | ICD-10-CM | POA: Diagnosis not present

## 2020-05-18 DIAGNOSIS — G4733 Obstructive sleep apnea (adult) (pediatric): Secondary | ICD-10-CM | POA: Diagnosis not present

## 2020-05-18 DIAGNOSIS — Z4781 Encounter for orthopedic aftercare following surgical amputation: Secondary | ICD-10-CM | POA: Diagnosis not present

## 2020-05-18 DIAGNOSIS — E1122 Type 2 diabetes mellitus with diabetic chronic kidney disease: Secondary | ICD-10-CM | POA: Diagnosis not present

## 2020-05-18 DIAGNOSIS — D631 Anemia in chronic kidney disease: Secondary | ICD-10-CM | POA: Diagnosis not present

## 2020-05-18 DIAGNOSIS — R262 Difficulty in walking, not elsewhere classified: Secondary | ICD-10-CM | POA: Diagnosis not present

## 2020-05-18 DIAGNOSIS — I219 Acute myocardial infarction, unspecified: Secondary | ICD-10-CM | POA: Diagnosis not present

## 2020-05-18 DIAGNOSIS — M6281 Muscle weakness (generalized): Secondary | ICD-10-CM | POA: Diagnosis not present

## 2020-05-18 DIAGNOSIS — I2510 Atherosclerotic heart disease of native coronary artery without angina pectoris: Secondary | ICD-10-CM | POA: Diagnosis not present

## 2020-05-18 DIAGNOSIS — M24541 Contracture, right hand: Secondary | ICD-10-CM | POA: Diagnosis not present

## 2020-05-18 DIAGNOSIS — G894 Chronic pain syndrome: Secondary | ICD-10-CM | POA: Diagnosis not present

## 2020-05-18 DIAGNOSIS — E1165 Type 2 diabetes mellitus with hyperglycemia: Secondary | ICD-10-CM | POA: Diagnosis not present

## 2020-05-18 DIAGNOSIS — J449 Chronic obstructive pulmonary disease, unspecified: Secondary | ICD-10-CM | POA: Diagnosis not present

## 2020-05-18 DIAGNOSIS — E1140 Type 2 diabetes mellitus with diabetic neuropathy, unspecified: Secondary | ICD-10-CM | POA: Diagnosis not present

## 2020-05-18 DIAGNOSIS — R251 Tremor, unspecified: Secondary | ICD-10-CM | POA: Diagnosis not present

## 2020-05-19 DIAGNOSIS — I219 Acute myocardial infarction, unspecified: Secondary | ICD-10-CM | POA: Diagnosis not present

## 2020-05-19 DIAGNOSIS — D631 Anemia in chronic kidney disease: Secondary | ICD-10-CM | POA: Diagnosis not present

## 2020-05-19 DIAGNOSIS — M6281 Muscle weakness (generalized): Secondary | ICD-10-CM | POA: Diagnosis not present

## 2020-05-19 DIAGNOSIS — N186 End stage renal disease: Secondary | ICD-10-CM | POA: Diagnosis not present

## 2020-05-19 DIAGNOSIS — Z4781 Encounter for orthopedic aftercare following surgical amputation: Secondary | ICD-10-CM | POA: Diagnosis not present

## 2020-05-19 DIAGNOSIS — E8779 Other fluid overload: Secondary | ICD-10-CM | POA: Diagnosis not present

## 2020-05-19 DIAGNOSIS — M24541 Contracture, right hand: Secondary | ICD-10-CM | POA: Diagnosis not present

## 2020-05-19 DIAGNOSIS — J449 Chronic obstructive pulmonary disease, unspecified: Secondary | ICD-10-CM | POA: Diagnosis not present

## 2020-05-19 DIAGNOSIS — N2581 Secondary hyperparathyroidism of renal origin: Secondary | ICD-10-CM | POA: Diagnosis not present

## 2020-05-19 DIAGNOSIS — R262 Difficulty in walking, not elsewhere classified: Secondary | ICD-10-CM | POA: Diagnosis not present

## 2020-05-19 DIAGNOSIS — D509 Iron deficiency anemia, unspecified: Secondary | ICD-10-CM | POA: Diagnosis not present

## 2020-05-19 DIAGNOSIS — M24542 Contracture, left hand: Secondary | ICD-10-CM | POA: Diagnosis not present

## 2020-05-22 DIAGNOSIS — M24541 Contracture, right hand: Secondary | ICD-10-CM | POA: Diagnosis not present

## 2020-05-22 DIAGNOSIS — D631 Anemia in chronic kidney disease: Secondary | ICD-10-CM | POA: Diagnosis not present

## 2020-05-22 DIAGNOSIS — M6281 Muscle weakness (generalized): Secondary | ICD-10-CM | POA: Diagnosis not present

## 2020-05-22 DIAGNOSIS — M24542 Contracture, left hand: Secondary | ICD-10-CM | POA: Diagnosis not present

## 2020-05-22 DIAGNOSIS — D509 Iron deficiency anemia, unspecified: Secondary | ICD-10-CM | POA: Diagnosis not present

## 2020-05-22 DIAGNOSIS — Z4781 Encounter for orthopedic aftercare following surgical amputation: Secondary | ICD-10-CM | POA: Diagnosis not present

## 2020-05-22 DIAGNOSIS — E8779 Other fluid overload: Secondary | ICD-10-CM | POA: Diagnosis not present

## 2020-05-22 DIAGNOSIS — N2581 Secondary hyperparathyroidism of renal origin: Secondary | ICD-10-CM | POA: Diagnosis not present

## 2020-05-22 DIAGNOSIS — R262 Difficulty in walking, not elsewhere classified: Secondary | ICD-10-CM | POA: Diagnosis not present

## 2020-05-22 DIAGNOSIS — I219 Acute myocardial infarction, unspecified: Secondary | ICD-10-CM | POA: Diagnosis not present

## 2020-05-22 DIAGNOSIS — J449 Chronic obstructive pulmonary disease, unspecified: Secondary | ICD-10-CM | POA: Diagnosis not present

## 2020-05-22 DIAGNOSIS — N186 End stage renal disease: Secondary | ICD-10-CM | POA: Diagnosis not present

## 2020-05-23 DIAGNOSIS — I219 Acute myocardial infarction, unspecified: Secondary | ICD-10-CM | POA: Diagnosis not present

## 2020-05-23 DIAGNOSIS — Z4781 Encounter for orthopedic aftercare following surgical amputation: Secondary | ICD-10-CM | POA: Diagnosis not present

## 2020-05-23 DIAGNOSIS — M24542 Contracture, left hand: Secondary | ICD-10-CM | POA: Diagnosis not present

## 2020-05-23 DIAGNOSIS — M6281 Muscle weakness (generalized): Secondary | ICD-10-CM | POA: Diagnosis not present

## 2020-05-23 DIAGNOSIS — M24541 Contracture, right hand: Secondary | ICD-10-CM | POA: Diagnosis not present

## 2020-05-23 DIAGNOSIS — R262 Difficulty in walking, not elsewhere classified: Secondary | ICD-10-CM | POA: Diagnosis not present

## 2020-05-23 DIAGNOSIS — J449 Chronic obstructive pulmonary disease, unspecified: Secondary | ICD-10-CM | POA: Diagnosis not present

## 2020-05-24 DIAGNOSIS — R262 Difficulty in walking, not elsewhere classified: Secondary | ICD-10-CM | POA: Diagnosis not present

## 2020-05-24 DIAGNOSIS — D631 Anemia in chronic kidney disease: Secondary | ICD-10-CM | POA: Diagnosis not present

## 2020-05-24 DIAGNOSIS — M24542 Contracture, left hand: Secondary | ICD-10-CM | POA: Diagnosis not present

## 2020-05-24 DIAGNOSIS — D509 Iron deficiency anemia, unspecified: Secondary | ICD-10-CM | POA: Diagnosis not present

## 2020-05-24 DIAGNOSIS — N2581 Secondary hyperparathyroidism of renal origin: Secondary | ICD-10-CM | POA: Diagnosis not present

## 2020-05-24 DIAGNOSIS — M6281 Muscle weakness (generalized): Secondary | ICD-10-CM | POA: Diagnosis not present

## 2020-05-24 DIAGNOSIS — E8779 Other fluid overload: Secondary | ICD-10-CM | POA: Diagnosis not present

## 2020-05-24 DIAGNOSIS — M24541 Contracture, right hand: Secondary | ICD-10-CM | POA: Diagnosis not present

## 2020-05-24 DIAGNOSIS — N186 End stage renal disease: Secondary | ICD-10-CM | POA: Diagnosis not present

## 2020-05-24 DIAGNOSIS — I219 Acute myocardial infarction, unspecified: Secondary | ICD-10-CM | POA: Diagnosis not present

## 2020-05-24 DIAGNOSIS — Z4781 Encounter for orthopedic aftercare following surgical amputation: Secondary | ICD-10-CM | POA: Diagnosis not present

## 2020-05-24 DIAGNOSIS — J449 Chronic obstructive pulmonary disease, unspecified: Secondary | ICD-10-CM | POA: Diagnosis not present

## 2020-05-25 DIAGNOSIS — E211 Secondary hyperparathyroidism, not elsewhere classified: Secondary | ICD-10-CM | POA: Diagnosis not present

## 2020-05-25 DIAGNOSIS — M24541 Contracture, right hand: Secondary | ICD-10-CM | POA: Diagnosis not present

## 2020-05-25 DIAGNOSIS — Z4781 Encounter for orthopedic aftercare following surgical amputation: Secondary | ICD-10-CM | POA: Diagnosis not present

## 2020-05-25 DIAGNOSIS — E1140 Type 2 diabetes mellitus with diabetic neuropathy, unspecified: Secondary | ICD-10-CM | POA: Diagnosis not present

## 2020-05-25 DIAGNOSIS — D631 Anemia in chronic kidney disease: Secondary | ICD-10-CM | POA: Diagnosis not present

## 2020-05-25 DIAGNOSIS — R251 Tremor, unspecified: Secondary | ICD-10-CM | POA: Diagnosis not present

## 2020-05-25 DIAGNOSIS — J449 Chronic obstructive pulmonary disease, unspecified: Secondary | ICD-10-CM | POA: Diagnosis not present

## 2020-05-25 DIAGNOSIS — E1165 Type 2 diabetes mellitus with hyperglycemia: Secondary | ICD-10-CM | POA: Diagnosis not present

## 2020-05-25 DIAGNOSIS — I2510 Atherosclerotic heart disease of native coronary artery without angina pectoris: Secondary | ICD-10-CM | POA: Diagnosis not present

## 2020-05-25 DIAGNOSIS — G894 Chronic pain syndrome: Secondary | ICD-10-CM | POA: Diagnosis not present

## 2020-05-25 DIAGNOSIS — M6281 Muscle weakness (generalized): Secondary | ICD-10-CM | POA: Diagnosis not present

## 2020-05-25 DIAGNOSIS — E1122 Type 2 diabetes mellitus with diabetic chronic kidney disease: Secondary | ICD-10-CM | POA: Diagnosis not present

## 2020-05-25 DIAGNOSIS — I219 Acute myocardial infarction, unspecified: Secondary | ICD-10-CM | POA: Diagnosis not present

## 2020-05-25 DIAGNOSIS — G4733 Obstructive sleep apnea (adult) (pediatric): Secondary | ICD-10-CM | POA: Diagnosis not present

## 2020-05-25 DIAGNOSIS — R262 Difficulty in walking, not elsewhere classified: Secondary | ICD-10-CM | POA: Diagnosis not present

## 2020-05-25 DIAGNOSIS — M24542 Contracture, left hand: Secondary | ICD-10-CM | POA: Diagnosis not present

## 2020-05-26 DIAGNOSIS — N186 End stage renal disease: Secondary | ICD-10-CM | POA: Diagnosis not present

## 2020-05-26 DIAGNOSIS — I219 Acute myocardial infarction, unspecified: Secondary | ICD-10-CM | POA: Diagnosis not present

## 2020-05-26 DIAGNOSIS — J449 Chronic obstructive pulmonary disease, unspecified: Secondary | ICD-10-CM | POA: Diagnosis not present

## 2020-05-26 DIAGNOSIS — D631 Anemia in chronic kidney disease: Secondary | ICD-10-CM | POA: Diagnosis not present

## 2020-05-26 DIAGNOSIS — R262 Difficulty in walking, not elsewhere classified: Secondary | ICD-10-CM | POA: Diagnosis not present

## 2020-05-26 DIAGNOSIS — D509 Iron deficiency anemia, unspecified: Secondary | ICD-10-CM | POA: Diagnosis not present

## 2020-05-26 DIAGNOSIS — E8779 Other fluid overload: Secondary | ICD-10-CM | POA: Diagnosis not present

## 2020-05-26 DIAGNOSIS — M24542 Contracture, left hand: Secondary | ICD-10-CM | POA: Diagnosis not present

## 2020-05-26 DIAGNOSIS — Z4781 Encounter for orthopedic aftercare following surgical amputation: Secondary | ICD-10-CM | POA: Diagnosis not present

## 2020-05-26 DIAGNOSIS — N2581 Secondary hyperparathyroidism of renal origin: Secondary | ICD-10-CM | POA: Diagnosis not present

## 2020-05-26 DIAGNOSIS — M24541 Contracture, right hand: Secondary | ICD-10-CM | POA: Diagnosis not present

## 2020-05-26 DIAGNOSIS — M6281 Muscle weakness (generalized): Secondary | ICD-10-CM | POA: Diagnosis not present

## 2020-05-29 DIAGNOSIS — R262 Difficulty in walking, not elsewhere classified: Secondary | ICD-10-CM | POA: Diagnosis not present

## 2020-05-29 DIAGNOSIS — M24542 Contracture, left hand: Secondary | ICD-10-CM | POA: Diagnosis not present

## 2020-05-29 DIAGNOSIS — M6281 Muscle weakness (generalized): Secondary | ICD-10-CM | POA: Diagnosis not present

## 2020-05-29 DIAGNOSIS — Z4781 Encounter for orthopedic aftercare following surgical amputation: Secondary | ICD-10-CM | POA: Diagnosis not present

## 2020-05-29 DIAGNOSIS — I219 Acute myocardial infarction, unspecified: Secondary | ICD-10-CM | POA: Diagnosis not present

## 2020-05-29 DIAGNOSIS — M24541 Contracture, right hand: Secondary | ICD-10-CM | POA: Diagnosis not present

## 2020-05-29 DIAGNOSIS — D509 Iron deficiency anemia, unspecified: Secondary | ICD-10-CM | POA: Diagnosis not present

## 2020-05-29 DIAGNOSIS — N186 End stage renal disease: Secondary | ICD-10-CM | POA: Diagnosis not present

## 2020-05-29 DIAGNOSIS — E8779 Other fluid overload: Secondary | ICD-10-CM | POA: Diagnosis not present

## 2020-05-29 DIAGNOSIS — J449 Chronic obstructive pulmonary disease, unspecified: Secondary | ICD-10-CM | POA: Diagnosis not present

## 2020-05-29 DIAGNOSIS — D631 Anemia in chronic kidney disease: Secondary | ICD-10-CM | POA: Diagnosis not present

## 2020-05-29 DIAGNOSIS — N2581 Secondary hyperparathyroidism of renal origin: Secondary | ICD-10-CM | POA: Diagnosis not present

## 2020-05-30 DIAGNOSIS — E1122 Type 2 diabetes mellitus with diabetic chronic kidney disease: Secondary | ICD-10-CM | POA: Diagnosis not present

## 2020-05-30 DIAGNOSIS — E211 Secondary hyperparathyroidism, not elsewhere classified: Secondary | ICD-10-CM | POA: Diagnosis not present

## 2020-05-30 DIAGNOSIS — E1140 Type 2 diabetes mellitus with diabetic neuropathy, unspecified: Secondary | ICD-10-CM | POA: Diagnosis not present

## 2020-05-30 DIAGNOSIS — I219 Acute myocardial infarction, unspecified: Secondary | ICD-10-CM | POA: Diagnosis not present

## 2020-05-30 DIAGNOSIS — R262 Difficulty in walking, not elsewhere classified: Secondary | ICD-10-CM | POA: Diagnosis not present

## 2020-05-30 DIAGNOSIS — E1165 Type 2 diabetes mellitus with hyperglycemia: Secondary | ICD-10-CM | POA: Diagnosis not present

## 2020-05-30 DIAGNOSIS — M6281 Muscle weakness (generalized): Secondary | ICD-10-CM | POA: Diagnosis not present

## 2020-05-30 DIAGNOSIS — D631 Anemia in chronic kidney disease: Secondary | ICD-10-CM | POA: Diagnosis not present

## 2020-05-30 DIAGNOSIS — G4733 Obstructive sleep apnea (adult) (pediatric): Secondary | ICD-10-CM | POA: Diagnosis not present

## 2020-05-30 DIAGNOSIS — G894 Chronic pain syndrome: Secondary | ICD-10-CM | POA: Diagnosis not present

## 2020-05-30 DIAGNOSIS — M24541 Contracture, right hand: Secondary | ICD-10-CM | POA: Diagnosis not present

## 2020-05-30 DIAGNOSIS — I2510 Atherosclerotic heart disease of native coronary artery without angina pectoris: Secondary | ICD-10-CM | POA: Diagnosis not present

## 2020-05-30 DIAGNOSIS — Z4781 Encounter for orthopedic aftercare following surgical amputation: Secondary | ICD-10-CM | POA: Diagnosis not present

## 2020-05-30 DIAGNOSIS — R251 Tremor, unspecified: Secondary | ICD-10-CM | POA: Diagnosis not present

## 2020-05-30 DIAGNOSIS — J449 Chronic obstructive pulmonary disease, unspecified: Secondary | ICD-10-CM | POA: Diagnosis not present

## 2020-05-30 DIAGNOSIS — M24542 Contracture, left hand: Secondary | ICD-10-CM | POA: Diagnosis not present

## 2020-05-31 DIAGNOSIS — N2581 Secondary hyperparathyroidism of renal origin: Secondary | ICD-10-CM | POA: Diagnosis not present

## 2020-05-31 DIAGNOSIS — D631 Anemia in chronic kidney disease: Secondary | ICD-10-CM | POA: Diagnosis not present

## 2020-05-31 DIAGNOSIS — N186 End stage renal disease: Secondary | ICD-10-CM | POA: Diagnosis not present

## 2020-05-31 DIAGNOSIS — D509 Iron deficiency anemia, unspecified: Secondary | ICD-10-CM | POA: Diagnosis not present

## 2020-06-02 DIAGNOSIS — D631 Anemia in chronic kidney disease: Secondary | ICD-10-CM | POA: Diagnosis not present

## 2020-06-02 DIAGNOSIS — N186 End stage renal disease: Secondary | ICD-10-CM | POA: Diagnosis not present

## 2020-06-02 DIAGNOSIS — N2581 Secondary hyperparathyroidism of renal origin: Secondary | ICD-10-CM | POA: Diagnosis not present

## 2020-06-02 DIAGNOSIS — D509 Iron deficiency anemia, unspecified: Secondary | ICD-10-CM | POA: Diagnosis not present

## 2020-06-05 DIAGNOSIS — N2581 Secondary hyperparathyroidism of renal origin: Secondary | ICD-10-CM | POA: Diagnosis not present

## 2020-06-05 DIAGNOSIS — D509 Iron deficiency anemia, unspecified: Secondary | ICD-10-CM | POA: Diagnosis not present

## 2020-06-05 DIAGNOSIS — N186 End stage renal disease: Secondary | ICD-10-CM | POA: Diagnosis not present

## 2020-06-05 DIAGNOSIS — D631 Anemia in chronic kidney disease: Secondary | ICD-10-CM | POA: Diagnosis not present

## 2020-06-07 DIAGNOSIS — N186 End stage renal disease: Secondary | ICD-10-CM | POA: Diagnosis not present

## 2020-06-07 DIAGNOSIS — D631 Anemia in chronic kidney disease: Secondary | ICD-10-CM | POA: Diagnosis not present

## 2020-06-07 DIAGNOSIS — D509 Iron deficiency anemia, unspecified: Secondary | ICD-10-CM | POA: Diagnosis not present

## 2020-06-07 DIAGNOSIS — N2581 Secondary hyperparathyroidism of renal origin: Secondary | ICD-10-CM | POA: Diagnosis not present

## 2020-06-08 DIAGNOSIS — R262 Difficulty in walking, not elsewhere classified: Secondary | ICD-10-CM | POA: Diagnosis not present

## 2020-06-08 DIAGNOSIS — E1165 Type 2 diabetes mellitus with hyperglycemia: Secondary | ICD-10-CM | POA: Diagnosis not present

## 2020-06-08 DIAGNOSIS — R251 Tremor, unspecified: Secondary | ICD-10-CM | POA: Diagnosis not present

## 2020-06-08 DIAGNOSIS — I219 Acute myocardial infarction, unspecified: Secondary | ICD-10-CM | POA: Diagnosis not present

## 2020-06-08 DIAGNOSIS — M24541 Contracture, right hand: Secondary | ICD-10-CM | POA: Diagnosis not present

## 2020-06-08 DIAGNOSIS — E211 Secondary hyperparathyroidism, not elsewhere classified: Secondary | ICD-10-CM | POA: Diagnosis not present

## 2020-06-08 DIAGNOSIS — E1140 Type 2 diabetes mellitus with diabetic neuropathy, unspecified: Secondary | ICD-10-CM | POA: Diagnosis not present

## 2020-06-08 DIAGNOSIS — Z4781 Encounter for orthopedic aftercare following surgical amputation: Secondary | ICD-10-CM | POA: Diagnosis not present

## 2020-06-08 DIAGNOSIS — G4733 Obstructive sleep apnea (adult) (pediatric): Secondary | ICD-10-CM | POA: Diagnosis not present

## 2020-06-08 DIAGNOSIS — M6281 Muscle weakness (generalized): Secondary | ICD-10-CM | POA: Diagnosis not present

## 2020-06-08 DIAGNOSIS — E1122 Type 2 diabetes mellitus with diabetic chronic kidney disease: Secondary | ICD-10-CM | POA: Diagnosis not present

## 2020-06-08 DIAGNOSIS — D631 Anemia in chronic kidney disease: Secondary | ICD-10-CM | POA: Diagnosis not present

## 2020-06-08 DIAGNOSIS — G894 Chronic pain syndrome: Secondary | ICD-10-CM | POA: Diagnosis not present

## 2020-06-08 DIAGNOSIS — M24542 Contracture, left hand: Secondary | ICD-10-CM | POA: Diagnosis not present

## 2020-06-08 DIAGNOSIS — I2510 Atherosclerotic heart disease of native coronary artery without angina pectoris: Secondary | ICD-10-CM | POA: Diagnosis not present

## 2020-06-08 DIAGNOSIS — J449 Chronic obstructive pulmonary disease, unspecified: Secondary | ICD-10-CM | POA: Diagnosis not present

## 2020-06-09 DIAGNOSIS — D509 Iron deficiency anemia, unspecified: Secondary | ICD-10-CM | POA: Diagnosis not present

## 2020-06-09 DIAGNOSIS — N186 End stage renal disease: Secondary | ICD-10-CM | POA: Diagnosis not present

## 2020-06-09 DIAGNOSIS — D631 Anemia in chronic kidney disease: Secondary | ICD-10-CM | POA: Diagnosis not present

## 2020-06-09 DIAGNOSIS — N2581 Secondary hyperparathyroidism of renal origin: Secondary | ICD-10-CM | POA: Diagnosis not present

## 2020-06-12 DIAGNOSIS — D509 Iron deficiency anemia, unspecified: Secondary | ICD-10-CM | POA: Diagnosis not present

## 2020-06-12 DIAGNOSIS — N2581 Secondary hyperparathyroidism of renal origin: Secondary | ICD-10-CM | POA: Diagnosis not present

## 2020-06-12 DIAGNOSIS — D631 Anemia in chronic kidney disease: Secondary | ICD-10-CM | POA: Diagnosis not present

## 2020-06-12 DIAGNOSIS — N186 End stage renal disease: Secondary | ICD-10-CM | POA: Diagnosis not present

## 2020-06-14 DIAGNOSIS — N186 End stage renal disease: Secondary | ICD-10-CM | POA: Diagnosis not present

## 2020-06-14 DIAGNOSIS — N2581 Secondary hyperparathyroidism of renal origin: Secondary | ICD-10-CM | POA: Diagnosis not present

## 2020-06-14 DIAGNOSIS — D631 Anemia in chronic kidney disease: Secondary | ICD-10-CM | POA: Diagnosis not present

## 2020-06-14 DIAGNOSIS — D509 Iron deficiency anemia, unspecified: Secondary | ICD-10-CM | POA: Diagnosis not present

## 2020-06-15 DIAGNOSIS — G894 Chronic pain syndrome: Secondary | ICD-10-CM | POA: Diagnosis not present

## 2020-06-15 DIAGNOSIS — I2510 Atherosclerotic heart disease of native coronary artery without angina pectoris: Secondary | ICD-10-CM | POA: Diagnosis not present

## 2020-06-15 DIAGNOSIS — E1165 Type 2 diabetes mellitus with hyperglycemia: Secondary | ICD-10-CM | POA: Diagnosis not present

## 2020-06-15 DIAGNOSIS — E211 Secondary hyperparathyroidism, not elsewhere classified: Secondary | ICD-10-CM | POA: Diagnosis not present

## 2020-06-15 DIAGNOSIS — M24541 Contracture, right hand: Secondary | ICD-10-CM | POA: Diagnosis not present

## 2020-06-15 DIAGNOSIS — M24542 Contracture, left hand: Secondary | ICD-10-CM | POA: Diagnosis not present

## 2020-06-15 DIAGNOSIS — J449 Chronic obstructive pulmonary disease, unspecified: Secondary | ICD-10-CM | POA: Diagnosis not present

## 2020-06-15 DIAGNOSIS — E1122 Type 2 diabetes mellitus with diabetic chronic kidney disease: Secondary | ICD-10-CM | POA: Diagnosis not present

## 2020-06-15 DIAGNOSIS — G4733 Obstructive sleep apnea (adult) (pediatric): Secondary | ICD-10-CM | POA: Diagnosis not present

## 2020-06-15 DIAGNOSIS — Z4781 Encounter for orthopedic aftercare following surgical amputation: Secondary | ICD-10-CM | POA: Diagnosis not present

## 2020-06-15 DIAGNOSIS — R262 Difficulty in walking, not elsewhere classified: Secondary | ICD-10-CM | POA: Diagnosis not present

## 2020-06-15 DIAGNOSIS — R251 Tremor, unspecified: Secondary | ICD-10-CM | POA: Diagnosis not present

## 2020-06-15 DIAGNOSIS — I219 Acute myocardial infarction, unspecified: Secondary | ICD-10-CM | POA: Diagnosis not present

## 2020-06-15 DIAGNOSIS — M6281 Muscle weakness (generalized): Secondary | ICD-10-CM | POA: Diagnosis not present

## 2020-06-15 DIAGNOSIS — E1140 Type 2 diabetes mellitus with diabetic neuropathy, unspecified: Secondary | ICD-10-CM | POA: Diagnosis not present

## 2020-06-15 DIAGNOSIS — D631 Anemia in chronic kidney disease: Secondary | ICD-10-CM | POA: Diagnosis not present

## 2020-06-16 DIAGNOSIS — N186 End stage renal disease: Secondary | ICD-10-CM | POA: Diagnosis not present

## 2020-06-16 DIAGNOSIS — N2581 Secondary hyperparathyroidism of renal origin: Secondary | ICD-10-CM | POA: Diagnosis not present

## 2020-06-16 DIAGNOSIS — D631 Anemia in chronic kidney disease: Secondary | ICD-10-CM | POA: Diagnosis not present

## 2020-06-16 DIAGNOSIS — D509 Iron deficiency anemia, unspecified: Secondary | ICD-10-CM | POA: Diagnosis not present

## 2020-06-19 DIAGNOSIS — N2581 Secondary hyperparathyroidism of renal origin: Secondary | ICD-10-CM | POA: Diagnosis not present

## 2020-06-19 DIAGNOSIS — D631 Anemia in chronic kidney disease: Secondary | ICD-10-CM | POA: Diagnosis not present

## 2020-06-19 DIAGNOSIS — D509 Iron deficiency anemia, unspecified: Secondary | ICD-10-CM | POA: Diagnosis not present

## 2020-06-19 DIAGNOSIS — N186 End stage renal disease: Secondary | ICD-10-CM | POA: Diagnosis not present

## 2020-06-21 DIAGNOSIS — D631 Anemia in chronic kidney disease: Secondary | ICD-10-CM | POA: Diagnosis not present

## 2020-06-21 DIAGNOSIS — N2581 Secondary hyperparathyroidism of renal origin: Secondary | ICD-10-CM | POA: Diagnosis not present

## 2020-06-21 DIAGNOSIS — N186 End stage renal disease: Secondary | ICD-10-CM | POA: Diagnosis not present

## 2020-06-21 DIAGNOSIS — D509 Iron deficiency anemia, unspecified: Secondary | ICD-10-CM | POA: Diagnosis not present

## 2020-06-22 DIAGNOSIS — I2510 Atherosclerotic heart disease of native coronary artery without angina pectoris: Secondary | ICD-10-CM | POA: Diagnosis not present

## 2020-06-22 DIAGNOSIS — J449 Chronic obstructive pulmonary disease, unspecified: Secondary | ICD-10-CM | POA: Diagnosis not present

## 2020-06-22 DIAGNOSIS — R251 Tremor, unspecified: Secondary | ICD-10-CM | POA: Diagnosis not present

## 2020-06-22 DIAGNOSIS — D631 Anemia in chronic kidney disease: Secondary | ICD-10-CM | POA: Diagnosis not present

## 2020-06-22 DIAGNOSIS — E211 Secondary hyperparathyroidism, not elsewhere classified: Secondary | ICD-10-CM | POA: Diagnosis not present

## 2020-06-22 DIAGNOSIS — M24542 Contracture, left hand: Secondary | ICD-10-CM | POA: Diagnosis not present

## 2020-06-22 DIAGNOSIS — E1165 Type 2 diabetes mellitus with hyperglycemia: Secondary | ICD-10-CM | POA: Diagnosis not present

## 2020-06-22 DIAGNOSIS — G4733 Obstructive sleep apnea (adult) (pediatric): Secondary | ICD-10-CM | POA: Diagnosis not present

## 2020-06-22 DIAGNOSIS — R262 Difficulty in walking, not elsewhere classified: Secondary | ICD-10-CM | POA: Diagnosis not present

## 2020-06-22 DIAGNOSIS — E1122 Type 2 diabetes mellitus with diabetic chronic kidney disease: Secondary | ICD-10-CM | POA: Diagnosis not present

## 2020-06-22 DIAGNOSIS — G894 Chronic pain syndrome: Secondary | ICD-10-CM | POA: Diagnosis not present

## 2020-06-22 DIAGNOSIS — Z4781 Encounter for orthopedic aftercare following surgical amputation: Secondary | ICD-10-CM | POA: Diagnosis not present

## 2020-06-22 DIAGNOSIS — M24541 Contracture, right hand: Secondary | ICD-10-CM | POA: Diagnosis not present

## 2020-06-22 DIAGNOSIS — E1140 Type 2 diabetes mellitus with diabetic neuropathy, unspecified: Secondary | ICD-10-CM | POA: Diagnosis not present

## 2020-06-22 DIAGNOSIS — M6281 Muscle weakness (generalized): Secondary | ICD-10-CM | POA: Diagnosis not present

## 2020-06-22 DIAGNOSIS — I219 Acute myocardial infarction, unspecified: Secondary | ICD-10-CM | POA: Diagnosis not present

## 2020-06-23 DIAGNOSIS — D509 Iron deficiency anemia, unspecified: Secondary | ICD-10-CM | POA: Diagnosis not present

## 2020-06-23 DIAGNOSIS — D631 Anemia in chronic kidney disease: Secondary | ICD-10-CM | POA: Diagnosis not present

## 2020-06-23 DIAGNOSIS — N186 End stage renal disease: Secondary | ICD-10-CM | POA: Diagnosis not present

## 2020-06-23 DIAGNOSIS — N2581 Secondary hyperparathyroidism of renal origin: Secondary | ICD-10-CM | POA: Diagnosis not present

## 2020-06-26 DIAGNOSIS — D631 Anemia in chronic kidney disease: Secondary | ICD-10-CM | POA: Diagnosis not present

## 2020-06-26 DIAGNOSIS — N186 End stage renal disease: Secondary | ICD-10-CM | POA: Diagnosis not present

## 2020-06-26 DIAGNOSIS — N2581 Secondary hyperparathyroidism of renal origin: Secondary | ICD-10-CM | POA: Diagnosis not present

## 2020-06-26 DIAGNOSIS — D509 Iron deficiency anemia, unspecified: Secondary | ICD-10-CM | POA: Diagnosis not present

## 2020-06-27 DIAGNOSIS — F17210 Nicotine dependence, cigarettes, uncomplicated: Secondary | ICD-10-CM | POA: Diagnosis not present

## 2020-06-27 DIAGNOSIS — Z89511 Acquired absence of right leg below knee: Secondary | ICD-10-CM | POA: Diagnosis not present

## 2020-06-27 DIAGNOSIS — Z4781 Encounter for orthopedic aftercare following surgical amputation: Secondary | ICD-10-CM | POA: Diagnosis not present

## 2020-06-28 DIAGNOSIS — N186 End stage renal disease: Secondary | ICD-10-CM | POA: Diagnosis not present

## 2020-06-28 DIAGNOSIS — N2581 Secondary hyperparathyroidism of renal origin: Secondary | ICD-10-CM | POA: Diagnosis not present

## 2020-06-28 DIAGNOSIS — D631 Anemia in chronic kidney disease: Secondary | ICD-10-CM | POA: Diagnosis not present

## 2020-06-28 DIAGNOSIS — D509 Iron deficiency anemia, unspecified: Secondary | ICD-10-CM | POA: Diagnosis not present

## 2020-06-29 DIAGNOSIS — E1122 Type 2 diabetes mellitus with diabetic chronic kidney disease: Secondary | ICD-10-CM | POA: Diagnosis not present

## 2020-06-29 DIAGNOSIS — I2510 Atherosclerotic heart disease of native coronary artery without angina pectoris: Secondary | ICD-10-CM | POA: Diagnosis not present

## 2020-06-29 DIAGNOSIS — M6281 Muscle weakness (generalized): Secondary | ICD-10-CM | POA: Diagnosis not present

## 2020-06-29 DIAGNOSIS — E1165 Type 2 diabetes mellitus with hyperglycemia: Secondary | ICD-10-CM | POA: Diagnosis not present

## 2020-06-29 DIAGNOSIS — D631 Anemia in chronic kidney disease: Secondary | ICD-10-CM | POA: Diagnosis not present

## 2020-06-29 DIAGNOSIS — J449 Chronic obstructive pulmonary disease, unspecified: Secondary | ICD-10-CM | POA: Diagnosis not present

## 2020-06-29 DIAGNOSIS — I219 Acute myocardial infarction, unspecified: Secondary | ICD-10-CM | POA: Diagnosis not present

## 2020-06-29 DIAGNOSIS — R2681 Unsteadiness on feet: Secondary | ICD-10-CM | POA: Diagnosis not present

## 2020-06-29 DIAGNOSIS — R262 Difficulty in walking, not elsewhere classified: Secondary | ICD-10-CM | POA: Diagnosis not present

## 2020-06-29 DIAGNOSIS — Z4781 Encounter for orthopedic aftercare following surgical amputation: Secondary | ICD-10-CM | POA: Diagnosis not present

## 2020-06-29 DIAGNOSIS — G4733 Obstructive sleep apnea (adult) (pediatric): Secondary | ICD-10-CM | POA: Diagnosis not present

## 2020-06-29 DIAGNOSIS — M24542 Contracture, left hand: Secondary | ICD-10-CM | POA: Diagnosis not present

## 2020-06-29 DIAGNOSIS — G894 Chronic pain syndrome: Secondary | ICD-10-CM | POA: Diagnosis not present

## 2020-06-29 DIAGNOSIS — E211 Secondary hyperparathyroidism, not elsewhere classified: Secondary | ICD-10-CM | POA: Diagnosis not present

## 2020-06-29 DIAGNOSIS — E1140 Type 2 diabetes mellitus with diabetic neuropathy, unspecified: Secondary | ICD-10-CM | POA: Diagnosis not present

## 2020-06-29 DIAGNOSIS — M24541 Contracture, right hand: Secondary | ICD-10-CM | POA: Diagnosis not present

## 2020-06-29 DIAGNOSIS — R251 Tremor, unspecified: Secondary | ICD-10-CM | POA: Diagnosis not present

## 2020-06-30 DIAGNOSIS — N2581 Secondary hyperparathyroidism of renal origin: Secondary | ICD-10-CM | POA: Diagnosis not present

## 2020-06-30 DIAGNOSIS — D509 Iron deficiency anemia, unspecified: Secondary | ICD-10-CM | POA: Diagnosis not present

## 2020-06-30 DIAGNOSIS — N186 End stage renal disease: Secondary | ICD-10-CM | POA: Diagnosis not present

## 2020-07-03 DIAGNOSIS — N2581 Secondary hyperparathyroidism of renal origin: Secondary | ICD-10-CM | POA: Diagnosis not present

## 2020-07-03 DIAGNOSIS — D509 Iron deficiency anemia, unspecified: Secondary | ICD-10-CM | POA: Diagnosis not present

## 2020-07-03 DIAGNOSIS — N186 End stage renal disease: Secondary | ICD-10-CM | POA: Diagnosis not present

## 2020-07-05 DIAGNOSIS — D509 Iron deficiency anemia, unspecified: Secondary | ICD-10-CM | POA: Diagnosis not present

## 2020-07-05 DIAGNOSIS — N2581 Secondary hyperparathyroidism of renal origin: Secondary | ICD-10-CM | POA: Diagnosis not present

## 2020-07-05 DIAGNOSIS — N186 End stage renal disease: Secondary | ICD-10-CM | POA: Diagnosis not present

## 2020-07-07 DIAGNOSIS — N186 End stage renal disease: Secondary | ICD-10-CM | POA: Diagnosis not present

## 2020-07-07 DIAGNOSIS — N2581 Secondary hyperparathyroidism of renal origin: Secondary | ICD-10-CM | POA: Diagnosis not present

## 2020-07-07 DIAGNOSIS — D509 Iron deficiency anemia, unspecified: Secondary | ICD-10-CM | POA: Diagnosis not present

## 2020-07-10 DIAGNOSIS — D509 Iron deficiency anemia, unspecified: Secondary | ICD-10-CM | POA: Diagnosis not present

## 2020-07-10 DIAGNOSIS — N2581 Secondary hyperparathyroidism of renal origin: Secondary | ICD-10-CM | POA: Diagnosis not present

## 2020-07-10 DIAGNOSIS — N186 End stage renal disease: Secondary | ICD-10-CM | POA: Diagnosis not present

## 2020-07-12 DIAGNOSIS — N2581 Secondary hyperparathyroidism of renal origin: Secondary | ICD-10-CM | POA: Diagnosis not present

## 2020-07-12 DIAGNOSIS — D509 Iron deficiency anemia, unspecified: Secondary | ICD-10-CM | POA: Diagnosis not present

## 2020-07-12 DIAGNOSIS — N186 End stage renal disease: Secondary | ICD-10-CM | POA: Diagnosis not present

## 2020-07-13 DIAGNOSIS — E1140 Type 2 diabetes mellitus with diabetic neuropathy, unspecified: Secondary | ICD-10-CM | POA: Diagnosis not present

## 2020-07-13 DIAGNOSIS — E1122 Type 2 diabetes mellitus with diabetic chronic kidney disease: Secondary | ICD-10-CM | POA: Diagnosis not present

## 2020-07-13 DIAGNOSIS — R251 Tremor, unspecified: Secondary | ICD-10-CM | POA: Diagnosis not present

## 2020-07-13 DIAGNOSIS — E1165 Type 2 diabetes mellitus with hyperglycemia: Secondary | ICD-10-CM | POA: Diagnosis not present

## 2020-07-13 DIAGNOSIS — E211 Secondary hyperparathyroidism, not elsewhere classified: Secondary | ICD-10-CM | POA: Diagnosis not present

## 2020-07-13 DIAGNOSIS — Z4781 Encounter for orthopedic aftercare following surgical amputation: Secondary | ICD-10-CM | POA: Diagnosis not present

## 2020-07-13 DIAGNOSIS — G894 Chronic pain syndrome: Secondary | ICD-10-CM | POA: Diagnosis not present

## 2020-07-13 DIAGNOSIS — G4733 Obstructive sleep apnea (adult) (pediatric): Secondary | ICD-10-CM | POA: Diagnosis not present

## 2020-07-13 DIAGNOSIS — D631 Anemia in chronic kidney disease: Secondary | ICD-10-CM | POA: Diagnosis not present

## 2020-07-13 DIAGNOSIS — I2510 Atherosclerotic heart disease of native coronary artery without angina pectoris: Secondary | ICD-10-CM | POA: Diagnosis not present

## 2020-07-13 DIAGNOSIS — I219 Acute myocardial infarction, unspecified: Secondary | ICD-10-CM | POA: Diagnosis not present

## 2020-07-14 DIAGNOSIS — N2581 Secondary hyperparathyroidism of renal origin: Secondary | ICD-10-CM | POA: Diagnosis not present

## 2020-07-14 DIAGNOSIS — D509 Iron deficiency anemia, unspecified: Secondary | ICD-10-CM | POA: Diagnosis not present

## 2020-07-14 DIAGNOSIS — N186 End stage renal disease: Secondary | ICD-10-CM | POA: Diagnosis not present

## 2020-07-17 DIAGNOSIS — N186 End stage renal disease: Secondary | ICD-10-CM | POA: Diagnosis not present

## 2020-07-17 DIAGNOSIS — N2581 Secondary hyperparathyroidism of renal origin: Secondary | ICD-10-CM | POA: Diagnosis not present

## 2020-07-17 DIAGNOSIS — D509 Iron deficiency anemia, unspecified: Secondary | ICD-10-CM | POA: Diagnosis not present

## 2020-07-19 DIAGNOSIS — N2581 Secondary hyperparathyroidism of renal origin: Secondary | ICD-10-CM | POA: Diagnosis not present

## 2020-07-19 DIAGNOSIS — N186 End stage renal disease: Secondary | ICD-10-CM | POA: Diagnosis not present

## 2020-07-19 DIAGNOSIS — D509 Iron deficiency anemia, unspecified: Secondary | ICD-10-CM | POA: Diagnosis not present

## 2020-07-20 DIAGNOSIS — D631 Anemia in chronic kidney disease: Secondary | ICD-10-CM | POA: Diagnosis not present

## 2020-07-20 DIAGNOSIS — M24541 Contracture, right hand: Secondary | ICD-10-CM | POA: Diagnosis not present

## 2020-07-20 DIAGNOSIS — E1122 Type 2 diabetes mellitus with diabetic chronic kidney disease: Secondary | ICD-10-CM | POA: Diagnosis not present

## 2020-07-20 DIAGNOSIS — G4733 Obstructive sleep apnea (adult) (pediatric): Secondary | ICD-10-CM | POA: Diagnosis not present

## 2020-07-20 DIAGNOSIS — E1165 Type 2 diabetes mellitus with hyperglycemia: Secondary | ICD-10-CM | POA: Diagnosis not present

## 2020-07-20 DIAGNOSIS — I219 Acute myocardial infarction, unspecified: Secondary | ICD-10-CM | POA: Diagnosis not present

## 2020-07-20 DIAGNOSIS — Z4781 Encounter for orthopedic aftercare following surgical amputation: Secondary | ICD-10-CM | POA: Diagnosis not present

## 2020-07-20 DIAGNOSIS — J449 Chronic obstructive pulmonary disease, unspecified: Secondary | ICD-10-CM | POA: Diagnosis not present

## 2020-07-20 DIAGNOSIS — M6281 Muscle weakness (generalized): Secondary | ICD-10-CM | POA: Diagnosis not present

## 2020-07-20 DIAGNOSIS — M24542 Contracture, left hand: Secondary | ICD-10-CM | POA: Diagnosis not present

## 2020-07-20 DIAGNOSIS — R262 Difficulty in walking, not elsewhere classified: Secondary | ICD-10-CM | POA: Diagnosis not present

## 2020-07-20 DIAGNOSIS — R251 Tremor, unspecified: Secondary | ICD-10-CM | POA: Diagnosis not present

## 2020-07-20 DIAGNOSIS — E211 Secondary hyperparathyroidism, not elsewhere classified: Secondary | ICD-10-CM | POA: Diagnosis not present

## 2020-07-20 DIAGNOSIS — E1140 Type 2 diabetes mellitus with diabetic neuropathy, unspecified: Secondary | ICD-10-CM | POA: Diagnosis not present

## 2020-07-20 DIAGNOSIS — R2681 Unsteadiness on feet: Secondary | ICD-10-CM | POA: Diagnosis not present

## 2020-07-20 DIAGNOSIS — I2510 Atherosclerotic heart disease of native coronary artery without angina pectoris: Secondary | ICD-10-CM | POA: Diagnosis not present

## 2020-07-20 DIAGNOSIS — G894 Chronic pain syndrome: Secondary | ICD-10-CM | POA: Diagnosis not present

## 2020-07-21 DIAGNOSIS — D509 Iron deficiency anemia, unspecified: Secondary | ICD-10-CM | POA: Diagnosis not present

## 2020-07-21 DIAGNOSIS — N186 End stage renal disease: Secondary | ICD-10-CM | POA: Diagnosis not present

## 2020-07-21 DIAGNOSIS — N2581 Secondary hyperparathyroidism of renal origin: Secondary | ICD-10-CM | POA: Diagnosis not present

## 2020-07-24 DIAGNOSIS — D509 Iron deficiency anemia, unspecified: Secondary | ICD-10-CM | POA: Diagnosis not present

## 2020-07-24 DIAGNOSIS — N2581 Secondary hyperparathyroidism of renal origin: Secondary | ICD-10-CM | POA: Diagnosis not present

## 2020-07-24 DIAGNOSIS — N186 End stage renal disease: Secondary | ICD-10-CM | POA: Diagnosis not present

## 2020-07-26 DIAGNOSIS — N186 End stage renal disease: Secondary | ICD-10-CM | POA: Diagnosis not present

## 2020-07-26 DIAGNOSIS — N2581 Secondary hyperparathyroidism of renal origin: Secondary | ICD-10-CM | POA: Diagnosis not present

## 2020-07-26 DIAGNOSIS — D509 Iron deficiency anemia, unspecified: Secondary | ICD-10-CM | POA: Diagnosis not present

## 2020-07-27 DIAGNOSIS — E211 Secondary hyperparathyroidism, not elsewhere classified: Secondary | ICD-10-CM | POA: Diagnosis not present

## 2020-07-27 DIAGNOSIS — R251 Tremor, unspecified: Secondary | ICD-10-CM | POA: Diagnosis not present

## 2020-07-27 DIAGNOSIS — J449 Chronic obstructive pulmonary disease, unspecified: Secondary | ICD-10-CM | POA: Diagnosis not present

## 2020-07-27 DIAGNOSIS — D631 Anemia in chronic kidney disease: Secondary | ICD-10-CM | POA: Diagnosis not present

## 2020-07-27 DIAGNOSIS — I219 Acute myocardial infarction, unspecified: Secondary | ICD-10-CM | POA: Diagnosis not present

## 2020-07-27 DIAGNOSIS — G894 Chronic pain syndrome: Secondary | ICD-10-CM | POA: Diagnosis not present

## 2020-07-27 DIAGNOSIS — E1140 Type 2 diabetes mellitus with diabetic neuropathy, unspecified: Secondary | ICD-10-CM | POA: Diagnosis not present

## 2020-07-27 DIAGNOSIS — G4733 Obstructive sleep apnea (adult) (pediatric): Secondary | ICD-10-CM | POA: Diagnosis not present

## 2020-07-27 DIAGNOSIS — M6281 Muscle weakness (generalized): Secondary | ICD-10-CM | POA: Diagnosis not present

## 2020-07-27 DIAGNOSIS — M24542 Contracture, left hand: Secondary | ICD-10-CM | POA: Diagnosis not present

## 2020-07-27 DIAGNOSIS — R262 Difficulty in walking, not elsewhere classified: Secondary | ICD-10-CM | POA: Diagnosis not present

## 2020-07-27 DIAGNOSIS — R2681 Unsteadiness on feet: Secondary | ICD-10-CM | POA: Diagnosis not present

## 2020-07-27 DIAGNOSIS — I2510 Atherosclerotic heart disease of native coronary artery without angina pectoris: Secondary | ICD-10-CM | POA: Diagnosis not present

## 2020-07-27 DIAGNOSIS — Z4781 Encounter for orthopedic aftercare following surgical amputation: Secondary | ICD-10-CM | POA: Diagnosis not present

## 2020-07-27 DIAGNOSIS — E1165 Type 2 diabetes mellitus with hyperglycemia: Secondary | ICD-10-CM | POA: Diagnosis not present

## 2020-07-27 DIAGNOSIS — E1122 Type 2 diabetes mellitus with diabetic chronic kidney disease: Secondary | ICD-10-CM | POA: Diagnosis not present

## 2020-07-27 DIAGNOSIS — M24541 Contracture, right hand: Secondary | ICD-10-CM | POA: Diagnosis not present

## 2020-07-28 DIAGNOSIS — N186 End stage renal disease: Secondary | ICD-10-CM | POA: Diagnosis not present

## 2020-07-28 DIAGNOSIS — D509 Iron deficiency anemia, unspecified: Secondary | ICD-10-CM | POA: Diagnosis not present

## 2020-07-28 DIAGNOSIS — N2581 Secondary hyperparathyroidism of renal origin: Secondary | ICD-10-CM | POA: Diagnosis not present

## 2020-07-30 DIAGNOSIS — E211 Secondary hyperparathyroidism, not elsewhere classified: Secondary | ICD-10-CM | POA: Diagnosis not present

## 2020-07-30 DIAGNOSIS — I219 Acute myocardial infarction, unspecified: Secondary | ICD-10-CM | POA: Diagnosis not present

## 2020-07-30 DIAGNOSIS — M6281 Muscle weakness (generalized): Secondary | ICD-10-CM | POA: Diagnosis not present

## 2020-07-30 DIAGNOSIS — R251 Tremor, unspecified: Secondary | ICD-10-CM | POA: Diagnosis not present

## 2020-07-30 DIAGNOSIS — I2510 Atherosclerotic heart disease of native coronary artery without angina pectoris: Secondary | ICD-10-CM | POA: Diagnosis not present

## 2020-07-30 DIAGNOSIS — D631 Anemia in chronic kidney disease: Secondary | ICD-10-CM | POA: Diagnosis not present

## 2020-07-30 DIAGNOSIS — Z4781 Encounter for orthopedic aftercare following surgical amputation: Secondary | ICD-10-CM | POA: Diagnosis not present

## 2020-07-30 DIAGNOSIS — G894 Chronic pain syndrome: Secondary | ICD-10-CM | POA: Diagnosis not present

## 2020-07-30 DIAGNOSIS — E1122 Type 2 diabetes mellitus with diabetic chronic kidney disease: Secondary | ICD-10-CM | POA: Diagnosis not present

## 2020-07-30 DIAGNOSIS — R2681 Unsteadiness on feet: Secondary | ICD-10-CM | POA: Diagnosis not present

## 2020-07-30 DIAGNOSIS — G4733 Obstructive sleep apnea (adult) (pediatric): Secondary | ICD-10-CM | POA: Diagnosis not present

## 2020-07-30 DIAGNOSIS — E1165 Type 2 diabetes mellitus with hyperglycemia: Secondary | ICD-10-CM | POA: Diagnosis not present

## 2020-07-30 DIAGNOSIS — R262 Difficulty in walking, not elsewhere classified: Secondary | ICD-10-CM | POA: Diagnosis not present

## 2020-07-30 DIAGNOSIS — E1140 Type 2 diabetes mellitus with diabetic neuropathy, unspecified: Secondary | ICD-10-CM | POA: Diagnosis not present

## 2020-07-30 DIAGNOSIS — R293 Abnormal posture: Secondary | ICD-10-CM | POA: Diagnosis not present

## 2020-07-31 DIAGNOSIS — R262 Difficulty in walking, not elsewhere classified: Secondary | ICD-10-CM | POA: Diagnosis not present

## 2020-07-31 DIAGNOSIS — M6281 Muscle weakness (generalized): Secondary | ICD-10-CM | POA: Diagnosis not present

## 2020-07-31 DIAGNOSIS — R293 Abnormal posture: Secondary | ICD-10-CM | POA: Diagnosis not present

## 2020-07-31 DIAGNOSIS — N186 End stage renal disease: Secondary | ICD-10-CM | POA: Diagnosis not present

## 2020-07-31 DIAGNOSIS — N2581 Secondary hyperparathyroidism of renal origin: Secondary | ICD-10-CM | POA: Diagnosis not present

## 2020-07-31 DIAGNOSIS — R2681 Unsteadiness on feet: Secondary | ICD-10-CM | POA: Diagnosis not present

## 2020-07-31 DIAGNOSIS — D509 Iron deficiency anemia, unspecified: Secondary | ICD-10-CM | POA: Diagnosis not present

## 2020-07-31 DIAGNOSIS — Z4781 Encounter for orthopedic aftercare following surgical amputation: Secondary | ICD-10-CM | POA: Diagnosis not present

## 2020-08-01 DIAGNOSIS — R262 Difficulty in walking, not elsewhere classified: Secondary | ICD-10-CM | POA: Diagnosis not present

## 2020-08-01 DIAGNOSIS — R2681 Unsteadiness on feet: Secondary | ICD-10-CM | POA: Diagnosis not present

## 2020-08-01 DIAGNOSIS — M6281 Muscle weakness (generalized): Secondary | ICD-10-CM | POA: Diagnosis not present

## 2020-08-01 DIAGNOSIS — Z4781 Encounter for orthopedic aftercare following surgical amputation: Secondary | ICD-10-CM | POA: Diagnosis not present

## 2020-08-01 DIAGNOSIS — R293 Abnormal posture: Secondary | ICD-10-CM | POA: Diagnosis not present

## 2020-08-02 DIAGNOSIS — D509 Iron deficiency anemia, unspecified: Secondary | ICD-10-CM | POA: Diagnosis not present

## 2020-08-02 DIAGNOSIS — R293 Abnormal posture: Secondary | ICD-10-CM | POA: Diagnosis not present

## 2020-08-02 DIAGNOSIS — Z4781 Encounter for orthopedic aftercare following surgical amputation: Secondary | ICD-10-CM | POA: Diagnosis not present

## 2020-08-02 DIAGNOSIS — N186 End stage renal disease: Secondary | ICD-10-CM | POA: Diagnosis not present

## 2020-08-02 DIAGNOSIS — M6281 Muscle weakness (generalized): Secondary | ICD-10-CM | POA: Diagnosis not present

## 2020-08-02 DIAGNOSIS — R262 Difficulty in walking, not elsewhere classified: Secondary | ICD-10-CM | POA: Diagnosis not present

## 2020-08-02 DIAGNOSIS — N2581 Secondary hyperparathyroidism of renal origin: Secondary | ICD-10-CM | POA: Diagnosis not present

## 2020-08-02 DIAGNOSIS — R2681 Unsteadiness on feet: Secondary | ICD-10-CM | POA: Diagnosis not present

## 2020-08-03 DIAGNOSIS — R2681 Unsteadiness on feet: Secondary | ICD-10-CM | POA: Diagnosis not present

## 2020-08-03 DIAGNOSIS — M6281 Muscle weakness (generalized): Secondary | ICD-10-CM | POA: Diagnosis not present

## 2020-08-03 DIAGNOSIS — R293 Abnormal posture: Secondary | ICD-10-CM | POA: Diagnosis not present

## 2020-08-03 DIAGNOSIS — Z4781 Encounter for orthopedic aftercare following surgical amputation: Secondary | ICD-10-CM | POA: Diagnosis not present

## 2020-08-03 DIAGNOSIS — R262 Difficulty in walking, not elsewhere classified: Secondary | ICD-10-CM | POA: Diagnosis not present

## 2020-08-04 DIAGNOSIS — N2581 Secondary hyperparathyroidism of renal origin: Secondary | ICD-10-CM | POA: Diagnosis not present

## 2020-08-04 DIAGNOSIS — R293 Abnormal posture: Secondary | ICD-10-CM | POA: Diagnosis not present

## 2020-08-04 DIAGNOSIS — R262 Difficulty in walking, not elsewhere classified: Secondary | ICD-10-CM | POA: Diagnosis not present

## 2020-08-04 DIAGNOSIS — Z4781 Encounter for orthopedic aftercare following surgical amputation: Secondary | ICD-10-CM | POA: Diagnosis not present

## 2020-08-04 DIAGNOSIS — N186 End stage renal disease: Secondary | ICD-10-CM | POA: Diagnosis not present

## 2020-08-04 DIAGNOSIS — R2681 Unsteadiness on feet: Secondary | ICD-10-CM | POA: Diagnosis not present

## 2020-08-04 DIAGNOSIS — D509 Iron deficiency anemia, unspecified: Secondary | ICD-10-CM | POA: Diagnosis not present

## 2020-08-04 DIAGNOSIS — M6281 Muscle weakness (generalized): Secondary | ICD-10-CM | POA: Diagnosis not present

## 2020-08-06 DIAGNOSIS — Z4781 Encounter for orthopedic aftercare following surgical amputation: Secondary | ICD-10-CM | POA: Diagnosis not present

## 2020-08-06 DIAGNOSIS — R293 Abnormal posture: Secondary | ICD-10-CM | POA: Diagnosis not present

## 2020-08-06 DIAGNOSIS — M6281 Muscle weakness (generalized): Secondary | ICD-10-CM | POA: Diagnosis not present

## 2020-08-06 DIAGNOSIS — R262 Difficulty in walking, not elsewhere classified: Secondary | ICD-10-CM | POA: Diagnosis not present

## 2020-08-06 DIAGNOSIS — R2681 Unsteadiness on feet: Secondary | ICD-10-CM | POA: Diagnosis not present

## 2020-08-07 DIAGNOSIS — D509 Iron deficiency anemia, unspecified: Secondary | ICD-10-CM | POA: Diagnosis not present

## 2020-08-07 DIAGNOSIS — R2681 Unsteadiness on feet: Secondary | ICD-10-CM | POA: Diagnosis not present

## 2020-08-07 DIAGNOSIS — R262 Difficulty in walking, not elsewhere classified: Secondary | ICD-10-CM | POA: Diagnosis not present

## 2020-08-07 DIAGNOSIS — N2581 Secondary hyperparathyroidism of renal origin: Secondary | ICD-10-CM | POA: Diagnosis not present

## 2020-08-07 DIAGNOSIS — M6281 Muscle weakness (generalized): Secondary | ICD-10-CM | POA: Diagnosis not present

## 2020-08-07 DIAGNOSIS — Z4781 Encounter for orthopedic aftercare following surgical amputation: Secondary | ICD-10-CM | POA: Diagnosis not present

## 2020-08-07 DIAGNOSIS — R293 Abnormal posture: Secondary | ICD-10-CM | POA: Diagnosis not present

## 2020-08-07 DIAGNOSIS — N186 End stage renal disease: Secondary | ICD-10-CM | POA: Diagnosis not present

## 2020-08-08 DIAGNOSIS — R262 Difficulty in walking, not elsewhere classified: Secondary | ICD-10-CM | POA: Diagnosis not present

## 2020-08-08 DIAGNOSIS — M6281 Muscle weakness (generalized): Secondary | ICD-10-CM | POA: Diagnosis not present

## 2020-08-08 DIAGNOSIS — R293 Abnormal posture: Secondary | ICD-10-CM | POA: Diagnosis not present

## 2020-08-08 DIAGNOSIS — Z4781 Encounter for orthopedic aftercare following surgical amputation: Secondary | ICD-10-CM | POA: Diagnosis not present

## 2020-08-08 DIAGNOSIS — R2681 Unsteadiness on feet: Secondary | ICD-10-CM | POA: Diagnosis not present

## 2020-08-09 DIAGNOSIS — D509 Iron deficiency anemia, unspecified: Secondary | ICD-10-CM | POA: Diagnosis not present

## 2020-08-09 DIAGNOSIS — R2681 Unsteadiness on feet: Secondary | ICD-10-CM | POA: Diagnosis not present

## 2020-08-09 DIAGNOSIS — M6281 Muscle weakness (generalized): Secondary | ICD-10-CM | POA: Diagnosis not present

## 2020-08-09 DIAGNOSIS — Z4781 Encounter for orthopedic aftercare following surgical amputation: Secondary | ICD-10-CM | POA: Diagnosis not present

## 2020-08-09 DIAGNOSIS — N2581 Secondary hyperparathyroidism of renal origin: Secondary | ICD-10-CM | POA: Diagnosis not present

## 2020-08-09 DIAGNOSIS — N186 End stage renal disease: Secondary | ICD-10-CM | POA: Diagnosis not present

## 2020-08-09 DIAGNOSIS — R293 Abnormal posture: Secondary | ICD-10-CM | POA: Diagnosis not present

## 2020-08-09 DIAGNOSIS — R262 Difficulty in walking, not elsewhere classified: Secondary | ICD-10-CM | POA: Diagnosis not present

## 2020-08-10 DIAGNOSIS — E211 Secondary hyperparathyroidism, not elsewhere classified: Secondary | ICD-10-CM | POA: Diagnosis not present

## 2020-08-10 DIAGNOSIS — I219 Acute myocardial infarction, unspecified: Secondary | ICD-10-CM | POA: Diagnosis not present

## 2020-08-10 DIAGNOSIS — E1140 Type 2 diabetes mellitus with diabetic neuropathy, unspecified: Secondary | ICD-10-CM | POA: Diagnosis not present

## 2020-08-10 DIAGNOSIS — I2510 Atherosclerotic heart disease of native coronary artery without angina pectoris: Secondary | ICD-10-CM | POA: Diagnosis not present

## 2020-08-10 DIAGNOSIS — D631 Anemia in chronic kidney disease: Secondary | ICD-10-CM | POA: Diagnosis not present

## 2020-08-10 DIAGNOSIS — R251 Tremor, unspecified: Secondary | ICD-10-CM | POA: Diagnosis not present

## 2020-08-10 DIAGNOSIS — R293 Abnormal posture: Secondary | ICD-10-CM | POA: Diagnosis not present

## 2020-08-10 DIAGNOSIS — R262 Difficulty in walking, not elsewhere classified: Secondary | ICD-10-CM | POA: Diagnosis not present

## 2020-08-10 DIAGNOSIS — G894 Chronic pain syndrome: Secondary | ICD-10-CM | POA: Diagnosis not present

## 2020-08-10 DIAGNOSIS — E1122 Type 2 diabetes mellitus with diabetic chronic kidney disease: Secondary | ICD-10-CM | POA: Diagnosis not present

## 2020-08-10 DIAGNOSIS — E1165 Type 2 diabetes mellitus with hyperglycemia: Secondary | ICD-10-CM | POA: Diagnosis not present

## 2020-08-10 DIAGNOSIS — Z4781 Encounter for orthopedic aftercare following surgical amputation: Secondary | ICD-10-CM | POA: Diagnosis not present

## 2020-08-10 DIAGNOSIS — G4733 Obstructive sleep apnea (adult) (pediatric): Secondary | ICD-10-CM | POA: Diagnosis not present

## 2020-08-10 DIAGNOSIS — R2681 Unsteadiness on feet: Secondary | ICD-10-CM | POA: Diagnosis not present

## 2020-08-10 DIAGNOSIS — M6281 Muscle weakness (generalized): Secondary | ICD-10-CM | POA: Diagnosis not present

## 2020-08-11 DIAGNOSIS — N186 End stage renal disease: Secondary | ICD-10-CM | POA: Diagnosis not present

## 2020-08-11 DIAGNOSIS — N2581 Secondary hyperparathyroidism of renal origin: Secondary | ICD-10-CM | POA: Diagnosis not present

## 2020-08-11 DIAGNOSIS — D509 Iron deficiency anemia, unspecified: Secondary | ICD-10-CM | POA: Diagnosis not present

## 2020-08-11 DIAGNOSIS — M6281 Muscle weakness (generalized): Secondary | ICD-10-CM | POA: Diagnosis not present

## 2020-08-11 DIAGNOSIS — R262 Difficulty in walking, not elsewhere classified: Secondary | ICD-10-CM | POA: Diagnosis not present

## 2020-08-11 DIAGNOSIS — R2681 Unsteadiness on feet: Secondary | ICD-10-CM | POA: Diagnosis not present

## 2020-08-11 DIAGNOSIS — R293 Abnormal posture: Secondary | ICD-10-CM | POA: Diagnosis not present

## 2020-08-11 DIAGNOSIS — Z4781 Encounter for orthopedic aftercare following surgical amputation: Secondary | ICD-10-CM | POA: Diagnosis not present

## 2020-08-14 DIAGNOSIS — N2581 Secondary hyperparathyroidism of renal origin: Secondary | ICD-10-CM | POA: Diagnosis not present

## 2020-08-14 DIAGNOSIS — N186 End stage renal disease: Secondary | ICD-10-CM | POA: Diagnosis not present

## 2020-08-14 DIAGNOSIS — D509 Iron deficiency anemia, unspecified: Secondary | ICD-10-CM | POA: Diagnosis not present

## 2020-08-15 DIAGNOSIS — R293 Abnormal posture: Secondary | ICD-10-CM | POA: Diagnosis not present

## 2020-08-15 DIAGNOSIS — R2681 Unsteadiness on feet: Secondary | ICD-10-CM | POA: Diagnosis not present

## 2020-08-15 DIAGNOSIS — M6281 Muscle weakness (generalized): Secondary | ICD-10-CM | POA: Diagnosis not present

## 2020-08-15 DIAGNOSIS — R262 Difficulty in walking, not elsewhere classified: Secondary | ICD-10-CM | POA: Diagnosis not present

## 2020-08-15 DIAGNOSIS — Z4781 Encounter for orthopedic aftercare following surgical amputation: Secondary | ICD-10-CM | POA: Diagnosis not present

## 2020-08-16 DIAGNOSIS — N186 End stage renal disease: Secondary | ICD-10-CM | POA: Diagnosis not present

## 2020-08-16 DIAGNOSIS — Z4781 Encounter for orthopedic aftercare following surgical amputation: Secondary | ICD-10-CM | POA: Diagnosis not present

## 2020-08-16 DIAGNOSIS — R262 Difficulty in walking, not elsewhere classified: Secondary | ICD-10-CM | POA: Diagnosis not present

## 2020-08-16 DIAGNOSIS — N2581 Secondary hyperparathyroidism of renal origin: Secondary | ICD-10-CM | POA: Diagnosis not present

## 2020-08-16 DIAGNOSIS — M6281 Muscle weakness (generalized): Secondary | ICD-10-CM | POA: Diagnosis not present

## 2020-08-16 DIAGNOSIS — R293 Abnormal posture: Secondary | ICD-10-CM | POA: Diagnosis not present

## 2020-08-16 DIAGNOSIS — D509 Iron deficiency anemia, unspecified: Secondary | ICD-10-CM | POA: Diagnosis not present

## 2020-08-16 DIAGNOSIS — R2681 Unsteadiness on feet: Secondary | ICD-10-CM | POA: Diagnosis not present

## 2020-08-17 DIAGNOSIS — E211 Secondary hyperparathyroidism, not elsewhere classified: Secondary | ICD-10-CM | POA: Diagnosis not present

## 2020-08-17 DIAGNOSIS — R293 Abnormal posture: Secondary | ICD-10-CM | POA: Diagnosis not present

## 2020-08-17 DIAGNOSIS — Z4781 Encounter for orthopedic aftercare following surgical amputation: Secondary | ICD-10-CM | POA: Diagnosis not present

## 2020-08-17 DIAGNOSIS — D631 Anemia in chronic kidney disease: Secondary | ICD-10-CM | POA: Diagnosis not present

## 2020-08-17 DIAGNOSIS — I2510 Atherosclerotic heart disease of native coronary artery without angina pectoris: Secondary | ICD-10-CM | POA: Diagnosis not present

## 2020-08-17 DIAGNOSIS — E1122 Type 2 diabetes mellitus with diabetic chronic kidney disease: Secondary | ICD-10-CM | POA: Diagnosis not present

## 2020-08-17 DIAGNOSIS — G894 Chronic pain syndrome: Secondary | ICD-10-CM | POA: Diagnosis not present

## 2020-08-17 DIAGNOSIS — R2681 Unsteadiness on feet: Secondary | ICD-10-CM | POA: Diagnosis not present

## 2020-08-17 DIAGNOSIS — R251 Tremor, unspecified: Secondary | ICD-10-CM | POA: Diagnosis not present

## 2020-08-17 DIAGNOSIS — R262 Difficulty in walking, not elsewhere classified: Secondary | ICD-10-CM | POA: Diagnosis not present

## 2020-08-17 DIAGNOSIS — M6281 Muscle weakness (generalized): Secondary | ICD-10-CM | POA: Diagnosis not present

## 2020-08-17 DIAGNOSIS — G4733 Obstructive sleep apnea (adult) (pediatric): Secondary | ICD-10-CM | POA: Diagnosis not present

## 2020-08-17 DIAGNOSIS — I219 Acute myocardial infarction, unspecified: Secondary | ICD-10-CM | POA: Diagnosis not present

## 2020-08-17 DIAGNOSIS — E1140 Type 2 diabetes mellitus with diabetic neuropathy, unspecified: Secondary | ICD-10-CM | POA: Diagnosis not present

## 2020-08-17 DIAGNOSIS — E1165 Type 2 diabetes mellitus with hyperglycemia: Secondary | ICD-10-CM | POA: Diagnosis not present

## 2020-08-18 DIAGNOSIS — D509 Iron deficiency anemia, unspecified: Secondary | ICD-10-CM | POA: Diagnosis not present

## 2020-08-18 DIAGNOSIS — R2681 Unsteadiness on feet: Secondary | ICD-10-CM | POA: Diagnosis not present

## 2020-08-18 DIAGNOSIS — N2581 Secondary hyperparathyroidism of renal origin: Secondary | ICD-10-CM | POA: Diagnosis not present

## 2020-08-18 DIAGNOSIS — R262 Difficulty in walking, not elsewhere classified: Secondary | ICD-10-CM | POA: Diagnosis not present

## 2020-08-18 DIAGNOSIS — Z4781 Encounter for orthopedic aftercare following surgical amputation: Secondary | ICD-10-CM | POA: Diagnosis not present

## 2020-08-18 DIAGNOSIS — R293 Abnormal posture: Secondary | ICD-10-CM | POA: Diagnosis not present

## 2020-08-18 DIAGNOSIS — N186 End stage renal disease: Secondary | ICD-10-CM | POA: Diagnosis not present

## 2020-08-18 DIAGNOSIS — M6281 Muscle weakness (generalized): Secondary | ICD-10-CM | POA: Diagnosis not present

## 2020-08-21 DIAGNOSIS — Z4781 Encounter for orthopedic aftercare following surgical amputation: Secondary | ICD-10-CM | POA: Diagnosis not present

## 2020-08-21 DIAGNOSIS — D509 Iron deficiency anemia, unspecified: Secondary | ICD-10-CM | POA: Diagnosis not present

## 2020-08-21 DIAGNOSIS — M6281 Muscle weakness (generalized): Secondary | ICD-10-CM | POA: Diagnosis not present

## 2020-08-21 DIAGNOSIS — N2581 Secondary hyperparathyroidism of renal origin: Secondary | ICD-10-CM | POA: Diagnosis not present

## 2020-08-21 DIAGNOSIS — R293 Abnormal posture: Secondary | ICD-10-CM | POA: Diagnosis not present

## 2020-08-21 DIAGNOSIS — R262 Difficulty in walking, not elsewhere classified: Secondary | ICD-10-CM | POA: Diagnosis not present

## 2020-08-21 DIAGNOSIS — N186 End stage renal disease: Secondary | ICD-10-CM | POA: Diagnosis not present

## 2020-08-21 DIAGNOSIS — R2681 Unsteadiness on feet: Secondary | ICD-10-CM | POA: Diagnosis not present

## 2020-08-22 DIAGNOSIS — R262 Difficulty in walking, not elsewhere classified: Secondary | ICD-10-CM | POA: Diagnosis not present

## 2020-08-22 DIAGNOSIS — M6281 Muscle weakness (generalized): Secondary | ICD-10-CM | POA: Diagnosis not present

## 2020-08-22 DIAGNOSIS — Z4781 Encounter for orthopedic aftercare following surgical amputation: Secondary | ICD-10-CM | POA: Diagnosis not present

## 2020-08-22 DIAGNOSIS — R293 Abnormal posture: Secondary | ICD-10-CM | POA: Diagnosis not present

## 2020-08-22 DIAGNOSIS — R2681 Unsteadiness on feet: Secondary | ICD-10-CM | POA: Diagnosis not present

## 2020-08-23 DIAGNOSIS — Z4781 Encounter for orthopedic aftercare following surgical amputation: Secondary | ICD-10-CM | POA: Diagnosis not present

## 2020-08-23 DIAGNOSIS — R293 Abnormal posture: Secondary | ICD-10-CM | POA: Diagnosis not present

## 2020-08-23 DIAGNOSIS — R2681 Unsteadiness on feet: Secondary | ICD-10-CM | POA: Diagnosis not present

## 2020-08-23 DIAGNOSIS — M6281 Muscle weakness (generalized): Secondary | ICD-10-CM | POA: Diagnosis not present

## 2020-08-23 DIAGNOSIS — N186 End stage renal disease: Secondary | ICD-10-CM | POA: Diagnosis not present

## 2020-08-23 DIAGNOSIS — R262 Difficulty in walking, not elsewhere classified: Secondary | ICD-10-CM | POA: Diagnosis not present

## 2020-08-23 DIAGNOSIS — D509 Iron deficiency anemia, unspecified: Secondary | ICD-10-CM | POA: Diagnosis not present

## 2020-08-23 DIAGNOSIS — N2581 Secondary hyperparathyroidism of renal origin: Secondary | ICD-10-CM | POA: Diagnosis not present

## 2020-08-24 DIAGNOSIS — I219 Acute myocardial infarction, unspecified: Secondary | ICD-10-CM | POA: Diagnosis not present

## 2020-08-24 DIAGNOSIS — E211 Secondary hyperparathyroidism, not elsewhere classified: Secondary | ICD-10-CM | POA: Diagnosis not present

## 2020-08-24 DIAGNOSIS — E1140 Type 2 diabetes mellitus with diabetic neuropathy, unspecified: Secondary | ICD-10-CM | POA: Diagnosis not present

## 2020-08-24 DIAGNOSIS — M6281 Muscle weakness (generalized): Secondary | ICD-10-CM | POA: Diagnosis not present

## 2020-08-24 DIAGNOSIS — Z4781 Encounter for orthopedic aftercare following surgical amputation: Secondary | ICD-10-CM | POA: Diagnosis not present

## 2020-08-24 DIAGNOSIS — E1122 Type 2 diabetes mellitus with diabetic chronic kidney disease: Secondary | ICD-10-CM | POA: Diagnosis not present

## 2020-08-24 DIAGNOSIS — D631 Anemia in chronic kidney disease: Secondary | ICD-10-CM | POA: Diagnosis not present

## 2020-08-24 DIAGNOSIS — I2510 Atherosclerotic heart disease of native coronary artery without angina pectoris: Secondary | ICD-10-CM | POA: Diagnosis not present

## 2020-08-24 DIAGNOSIS — R2681 Unsteadiness on feet: Secondary | ICD-10-CM | POA: Diagnosis not present

## 2020-08-24 DIAGNOSIS — R251 Tremor, unspecified: Secondary | ICD-10-CM | POA: Diagnosis not present

## 2020-08-24 DIAGNOSIS — R293 Abnormal posture: Secondary | ICD-10-CM | POA: Diagnosis not present

## 2020-08-24 DIAGNOSIS — E1165 Type 2 diabetes mellitus with hyperglycemia: Secondary | ICD-10-CM | POA: Diagnosis not present

## 2020-08-24 DIAGNOSIS — G4733 Obstructive sleep apnea (adult) (pediatric): Secondary | ICD-10-CM | POA: Diagnosis not present

## 2020-08-24 DIAGNOSIS — G894 Chronic pain syndrome: Secondary | ICD-10-CM | POA: Diagnosis not present

## 2020-08-24 DIAGNOSIS — R262 Difficulty in walking, not elsewhere classified: Secondary | ICD-10-CM | POA: Diagnosis not present

## 2020-08-25 DIAGNOSIS — M6281 Muscle weakness (generalized): Secondary | ICD-10-CM | POA: Diagnosis not present

## 2020-08-25 DIAGNOSIS — R2681 Unsteadiness on feet: Secondary | ICD-10-CM | POA: Diagnosis not present

## 2020-08-25 DIAGNOSIS — R293 Abnormal posture: Secondary | ICD-10-CM | POA: Diagnosis not present

## 2020-08-25 DIAGNOSIS — D509 Iron deficiency anemia, unspecified: Secondary | ICD-10-CM | POA: Diagnosis not present

## 2020-08-25 DIAGNOSIS — Z4781 Encounter for orthopedic aftercare following surgical amputation: Secondary | ICD-10-CM | POA: Diagnosis not present

## 2020-08-25 DIAGNOSIS — N2581 Secondary hyperparathyroidism of renal origin: Secondary | ICD-10-CM | POA: Diagnosis not present

## 2020-08-25 DIAGNOSIS — N186 End stage renal disease: Secondary | ICD-10-CM | POA: Diagnosis not present

## 2020-08-25 DIAGNOSIS — R262 Difficulty in walking, not elsewhere classified: Secondary | ICD-10-CM | POA: Diagnosis not present

## 2020-08-28 DIAGNOSIS — Z4781 Encounter for orthopedic aftercare following surgical amputation: Secondary | ICD-10-CM | POA: Diagnosis not present

## 2020-08-28 DIAGNOSIS — R2681 Unsteadiness on feet: Secondary | ICD-10-CM | POA: Diagnosis not present

## 2020-08-28 DIAGNOSIS — R293 Abnormal posture: Secondary | ICD-10-CM | POA: Diagnosis not present

## 2020-08-28 DIAGNOSIS — N2581 Secondary hyperparathyroidism of renal origin: Secondary | ICD-10-CM | POA: Diagnosis not present

## 2020-08-28 DIAGNOSIS — R262 Difficulty in walking, not elsewhere classified: Secondary | ICD-10-CM | POA: Diagnosis not present

## 2020-08-28 DIAGNOSIS — N186 End stage renal disease: Secondary | ICD-10-CM | POA: Diagnosis not present

## 2020-08-28 DIAGNOSIS — M6281 Muscle weakness (generalized): Secondary | ICD-10-CM | POA: Diagnosis not present

## 2020-08-28 DIAGNOSIS — D509 Iron deficiency anemia, unspecified: Secondary | ICD-10-CM | POA: Diagnosis not present

## 2020-08-29 DIAGNOSIS — R262 Difficulty in walking, not elsewhere classified: Secondary | ICD-10-CM | POA: Diagnosis not present

## 2020-08-29 DIAGNOSIS — R293 Abnormal posture: Secondary | ICD-10-CM | POA: Diagnosis not present

## 2020-08-29 DIAGNOSIS — Z4781 Encounter for orthopedic aftercare following surgical amputation: Secondary | ICD-10-CM | POA: Diagnosis not present

## 2020-08-29 DIAGNOSIS — R2681 Unsteadiness on feet: Secondary | ICD-10-CM | POA: Diagnosis not present

## 2020-08-29 DIAGNOSIS — M6281 Muscle weakness (generalized): Secondary | ICD-10-CM | POA: Diagnosis not present

## 2020-08-30 DIAGNOSIS — R251 Tremor, unspecified: Secondary | ICD-10-CM | POA: Diagnosis not present

## 2020-08-30 DIAGNOSIS — I2510 Atherosclerotic heart disease of native coronary artery without angina pectoris: Secondary | ICD-10-CM | POA: Diagnosis not present

## 2020-08-30 DIAGNOSIS — R2681 Unsteadiness on feet: Secondary | ICD-10-CM | POA: Diagnosis not present

## 2020-08-30 DIAGNOSIS — D509 Iron deficiency anemia, unspecified: Secondary | ICD-10-CM | POA: Diagnosis not present

## 2020-08-30 DIAGNOSIS — E211 Secondary hyperparathyroidism, not elsewhere classified: Secondary | ICD-10-CM | POA: Diagnosis not present

## 2020-08-30 DIAGNOSIS — G4733 Obstructive sleep apnea (adult) (pediatric): Secondary | ICD-10-CM | POA: Diagnosis not present

## 2020-08-30 DIAGNOSIS — E1140 Type 2 diabetes mellitus with diabetic neuropathy, unspecified: Secondary | ICD-10-CM | POA: Diagnosis not present

## 2020-08-30 DIAGNOSIS — M6281 Muscle weakness (generalized): Secondary | ICD-10-CM | POA: Diagnosis not present

## 2020-08-30 DIAGNOSIS — I219 Acute myocardial infarction, unspecified: Secondary | ICD-10-CM | POA: Diagnosis not present

## 2020-08-30 DIAGNOSIS — D631 Anemia in chronic kidney disease: Secondary | ICD-10-CM | POA: Diagnosis not present

## 2020-08-30 DIAGNOSIS — Z23 Encounter for immunization: Secondary | ICD-10-CM | POA: Diagnosis not present

## 2020-08-30 DIAGNOSIS — E1165 Type 2 diabetes mellitus with hyperglycemia: Secondary | ICD-10-CM | POA: Diagnosis not present

## 2020-08-30 DIAGNOSIS — N2581 Secondary hyperparathyroidism of renal origin: Secondary | ICD-10-CM | POA: Diagnosis not present

## 2020-08-30 DIAGNOSIS — G894 Chronic pain syndrome: Secondary | ICD-10-CM | POA: Diagnosis not present

## 2020-08-30 DIAGNOSIS — Z4781 Encounter for orthopedic aftercare following surgical amputation: Secondary | ICD-10-CM | POA: Diagnosis not present

## 2020-08-30 DIAGNOSIS — R262 Difficulty in walking, not elsewhere classified: Secondary | ICD-10-CM | POA: Diagnosis not present

## 2020-08-30 DIAGNOSIS — N186 End stage renal disease: Secondary | ICD-10-CM | POA: Diagnosis not present

## 2020-08-30 DIAGNOSIS — E1122 Type 2 diabetes mellitus with diabetic chronic kidney disease: Secondary | ICD-10-CM | POA: Diagnosis not present

## 2020-08-30 DIAGNOSIS — R293 Abnormal posture: Secondary | ICD-10-CM | POA: Diagnosis not present

## 2020-09-01 DIAGNOSIS — N186 End stage renal disease: Secondary | ICD-10-CM | POA: Diagnosis not present

## 2020-09-01 DIAGNOSIS — Z23 Encounter for immunization: Secondary | ICD-10-CM | POA: Diagnosis not present

## 2020-09-01 DIAGNOSIS — D509 Iron deficiency anemia, unspecified: Secondary | ICD-10-CM | POA: Diagnosis not present

## 2020-09-01 DIAGNOSIS — N2581 Secondary hyperparathyroidism of renal origin: Secondary | ICD-10-CM | POA: Diagnosis not present

## 2020-09-04 DIAGNOSIS — D509 Iron deficiency anemia, unspecified: Secondary | ICD-10-CM | POA: Diagnosis not present

## 2020-09-04 DIAGNOSIS — N2581 Secondary hyperparathyroidism of renal origin: Secondary | ICD-10-CM | POA: Diagnosis not present

## 2020-09-04 DIAGNOSIS — Z23 Encounter for immunization: Secondary | ICD-10-CM | POA: Diagnosis not present

## 2020-09-04 DIAGNOSIS — N186 End stage renal disease: Secondary | ICD-10-CM | POA: Diagnosis not present

## 2020-09-06 DIAGNOSIS — N2581 Secondary hyperparathyroidism of renal origin: Secondary | ICD-10-CM | POA: Diagnosis not present

## 2020-09-06 DIAGNOSIS — Z23 Encounter for immunization: Secondary | ICD-10-CM | POA: Diagnosis not present

## 2020-09-06 DIAGNOSIS — N186 End stage renal disease: Secondary | ICD-10-CM | POA: Diagnosis not present

## 2020-09-06 DIAGNOSIS — D509 Iron deficiency anemia, unspecified: Secondary | ICD-10-CM | POA: Diagnosis not present

## 2020-09-07 DIAGNOSIS — E1140 Type 2 diabetes mellitus with diabetic neuropathy, unspecified: Secondary | ICD-10-CM | POA: Diagnosis not present

## 2020-09-07 DIAGNOSIS — G894 Chronic pain syndrome: Secondary | ICD-10-CM | POA: Diagnosis not present

## 2020-09-07 DIAGNOSIS — D631 Anemia in chronic kidney disease: Secondary | ICD-10-CM | POA: Diagnosis not present

## 2020-09-07 DIAGNOSIS — I219 Acute myocardial infarction, unspecified: Secondary | ICD-10-CM | POA: Diagnosis not present

## 2020-09-07 DIAGNOSIS — E1165 Type 2 diabetes mellitus with hyperglycemia: Secondary | ICD-10-CM | POA: Diagnosis not present

## 2020-09-07 DIAGNOSIS — G4733 Obstructive sleep apnea (adult) (pediatric): Secondary | ICD-10-CM | POA: Diagnosis not present

## 2020-09-07 DIAGNOSIS — E211 Secondary hyperparathyroidism, not elsewhere classified: Secondary | ICD-10-CM | POA: Diagnosis not present

## 2020-09-07 DIAGNOSIS — Z4781 Encounter for orthopedic aftercare following surgical amputation: Secondary | ICD-10-CM | POA: Diagnosis not present

## 2020-09-07 DIAGNOSIS — E1122 Type 2 diabetes mellitus with diabetic chronic kidney disease: Secondary | ICD-10-CM | POA: Diagnosis not present

## 2020-09-07 DIAGNOSIS — R251 Tremor, unspecified: Secondary | ICD-10-CM | POA: Diagnosis not present

## 2020-09-07 DIAGNOSIS — I2510 Atherosclerotic heart disease of native coronary artery without angina pectoris: Secondary | ICD-10-CM | POA: Diagnosis not present

## 2020-09-08 DIAGNOSIS — Z23 Encounter for immunization: Secondary | ICD-10-CM | POA: Diagnosis not present

## 2020-09-08 DIAGNOSIS — N186 End stage renal disease: Secondary | ICD-10-CM | POA: Diagnosis not present

## 2020-09-08 DIAGNOSIS — N2581 Secondary hyperparathyroidism of renal origin: Secondary | ICD-10-CM | POA: Diagnosis not present

## 2020-09-08 DIAGNOSIS — D509 Iron deficiency anemia, unspecified: Secondary | ICD-10-CM | POA: Diagnosis not present

## 2020-09-11 DIAGNOSIS — N2581 Secondary hyperparathyroidism of renal origin: Secondary | ICD-10-CM | POA: Diagnosis not present

## 2020-09-11 DIAGNOSIS — N186 End stage renal disease: Secondary | ICD-10-CM | POA: Diagnosis not present

## 2020-09-11 DIAGNOSIS — D509 Iron deficiency anemia, unspecified: Secondary | ICD-10-CM | POA: Diagnosis not present

## 2020-09-11 DIAGNOSIS — Z23 Encounter for immunization: Secondary | ICD-10-CM | POA: Diagnosis not present

## 2020-09-13 DIAGNOSIS — N186 End stage renal disease: Secondary | ICD-10-CM | POA: Diagnosis not present

## 2020-09-13 DIAGNOSIS — Z23 Encounter for immunization: Secondary | ICD-10-CM | POA: Diagnosis not present

## 2020-09-13 DIAGNOSIS — D509 Iron deficiency anemia, unspecified: Secondary | ICD-10-CM | POA: Diagnosis not present

## 2020-09-13 DIAGNOSIS — N2581 Secondary hyperparathyroidism of renal origin: Secondary | ICD-10-CM | POA: Diagnosis not present

## 2020-09-14 DIAGNOSIS — E211 Secondary hyperparathyroidism, not elsewhere classified: Secondary | ICD-10-CM | POA: Diagnosis not present

## 2020-09-14 DIAGNOSIS — Z4781 Encounter for orthopedic aftercare following surgical amputation: Secondary | ICD-10-CM | POA: Diagnosis not present

## 2020-09-14 DIAGNOSIS — E1122 Type 2 diabetes mellitus with diabetic chronic kidney disease: Secondary | ICD-10-CM | POA: Diagnosis not present

## 2020-09-14 DIAGNOSIS — R251 Tremor, unspecified: Secondary | ICD-10-CM | POA: Diagnosis not present

## 2020-09-14 DIAGNOSIS — G894 Chronic pain syndrome: Secondary | ICD-10-CM | POA: Diagnosis not present

## 2020-09-14 DIAGNOSIS — E1140 Type 2 diabetes mellitus with diabetic neuropathy, unspecified: Secondary | ICD-10-CM | POA: Diagnosis not present

## 2020-09-14 DIAGNOSIS — D631 Anemia in chronic kidney disease: Secondary | ICD-10-CM | POA: Diagnosis not present

## 2020-09-14 DIAGNOSIS — G4733 Obstructive sleep apnea (adult) (pediatric): Secondary | ICD-10-CM | POA: Diagnosis not present

## 2020-09-14 DIAGNOSIS — I2510 Atherosclerotic heart disease of native coronary artery without angina pectoris: Secondary | ICD-10-CM | POA: Diagnosis not present

## 2020-09-14 DIAGNOSIS — I219 Acute myocardial infarction, unspecified: Secondary | ICD-10-CM | POA: Diagnosis not present

## 2020-09-14 DIAGNOSIS — E1165 Type 2 diabetes mellitus with hyperglycemia: Secondary | ICD-10-CM | POA: Diagnosis not present

## 2020-09-15 DIAGNOSIS — N2581 Secondary hyperparathyroidism of renal origin: Secondary | ICD-10-CM | POA: Diagnosis not present

## 2020-09-15 DIAGNOSIS — D509 Iron deficiency anemia, unspecified: Secondary | ICD-10-CM | POA: Diagnosis not present

## 2020-09-15 DIAGNOSIS — N186 End stage renal disease: Secondary | ICD-10-CM | POA: Diagnosis not present

## 2020-09-15 DIAGNOSIS — Z23 Encounter for immunization: Secondary | ICD-10-CM | POA: Diagnosis not present

## 2020-09-18 DIAGNOSIS — N2581 Secondary hyperparathyroidism of renal origin: Secondary | ICD-10-CM | POA: Diagnosis not present

## 2020-09-18 DIAGNOSIS — N186 End stage renal disease: Secondary | ICD-10-CM | POA: Diagnosis not present

## 2020-09-18 DIAGNOSIS — D509 Iron deficiency anemia, unspecified: Secondary | ICD-10-CM | POA: Diagnosis not present

## 2020-09-18 DIAGNOSIS — Z23 Encounter for immunization: Secondary | ICD-10-CM | POA: Diagnosis not present

## 2020-09-20 DIAGNOSIS — Z23 Encounter for immunization: Secondary | ICD-10-CM | POA: Diagnosis not present

## 2020-09-20 DIAGNOSIS — N186 End stage renal disease: Secondary | ICD-10-CM | POA: Diagnosis not present

## 2020-09-20 DIAGNOSIS — D509 Iron deficiency anemia, unspecified: Secondary | ICD-10-CM | POA: Diagnosis not present

## 2020-09-20 DIAGNOSIS — N2581 Secondary hyperparathyroidism of renal origin: Secondary | ICD-10-CM | POA: Diagnosis not present

## 2020-09-21 DIAGNOSIS — I219 Acute myocardial infarction, unspecified: Secondary | ICD-10-CM | POA: Diagnosis not present

## 2020-09-21 DIAGNOSIS — G4733 Obstructive sleep apnea (adult) (pediatric): Secondary | ICD-10-CM | POA: Diagnosis not present

## 2020-09-21 DIAGNOSIS — G894 Chronic pain syndrome: Secondary | ICD-10-CM | POA: Diagnosis not present

## 2020-09-21 DIAGNOSIS — E1122 Type 2 diabetes mellitus with diabetic chronic kidney disease: Secondary | ICD-10-CM | POA: Diagnosis not present

## 2020-09-21 DIAGNOSIS — E1140 Type 2 diabetes mellitus with diabetic neuropathy, unspecified: Secondary | ICD-10-CM | POA: Diagnosis not present

## 2020-09-21 DIAGNOSIS — E1165 Type 2 diabetes mellitus with hyperglycemia: Secondary | ICD-10-CM | POA: Diagnosis not present

## 2020-09-21 DIAGNOSIS — I2510 Atherosclerotic heart disease of native coronary artery without angina pectoris: Secondary | ICD-10-CM | POA: Diagnosis not present

## 2020-09-21 DIAGNOSIS — D631 Anemia in chronic kidney disease: Secondary | ICD-10-CM | POA: Diagnosis not present

## 2020-09-21 DIAGNOSIS — Z4781 Encounter for orthopedic aftercare following surgical amputation: Secondary | ICD-10-CM | POA: Diagnosis not present

## 2020-09-21 DIAGNOSIS — R251 Tremor, unspecified: Secondary | ICD-10-CM | POA: Diagnosis not present

## 2020-09-21 DIAGNOSIS — E211 Secondary hyperparathyroidism, not elsewhere classified: Secondary | ICD-10-CM | POA: Diagnosis not present

## 2020-09-22 DIAGNOSIS — N186 End stage renal disease: Secondary | ICD-10-CM | POA: Diagnosis not present

## 2020-09-22 DIAGNOSIS — N2581 Secondary hyperparathyroidism of renal origin: Secondary | ICD-10-CM | POA: Diagnosis not present

## 2020-09-22 DIAGNOSIS — Z23 Encounter for immunization: Secondary | ICD-10-CM | POA: Diagnosis not present

## 2020-09-22 DIAGNOSIS — D509 Iron deficiency anemia, unspecified: Secondary | ICD-10-CM | POA: Diagnosis not present

## 2020-09-25 DIAGNOSIS — D509 Iron deficiency anemia, unspecified: Secondary | ICD-10-CM | POA: Diagnosis not present

## 2020-09-25 DIAGNOSIS — N186 End stage renal disease: Secondary | ICD-10-CM | POA: Diagnosis not present

## 2020-09-25 DIAGNOSIS — Z23 Encounter for immunization: Secondary | ICD-10-CM | POA: Diagnosis not present

## 2020-09-25 DIAGNOSIS — N2581 Secondary hyperparathyroidism of renal origin: Secondary | ICD-10-CM | POA: Diagnosis not present

## 2020-09-27 DIAGNOSIS — N2581 Secondary hyperparathyroidism of renal origin: Secondary | ICD-10-CM | POA: Diagnosis not present

## 2020-09-27 DIAGNOSIS — Z23 Encounter for immunization: Secondary | ICD-10-CM | POA: Diagnosis not present

## 2020-09-27 DIAGNOSIS — D509 Iron deficiency anemia, unspecified: Secondary | ICD-10-CM | POA: Diagnosis not present

## 2020-09-27 DIAGNOSIS — N186 End stage renal disease: Secondary | ICD-10-CM | POA: Diagnosis not present

## 2020-09-28 DIAGNOSIS — G894 Chronic pain syndrome: Secondary | ICD-10-CM | POA: Diagnosis not present

## 2020-09-28 DIAGNOSIS — E1165 Type 2 diabetes mellitus with hyperglycemia: Secondary | ICD-10-CM | POA: Diagnosis not present

## 2020-09-28 DIAGNOSIS — E1122 Type 2 diabetes mellitus with diabetic chronic kidney disease: Secondary | ICD-10-CM | POA: Diagnosis not present

## 2020-09-28 DIAGNOSIS — N2581 Secondary hyperparathyroidism of renal origin: Secondary | ICD-10-CM | POA: Diagnosis not present

## 2020-09-28 DIAGNOSIS — E211 Secondary hyperparathyroidism, not elsewhere classified: Secondary | ICD-10-CM | POA: Diagnosis not present

## 2020-09-28 DIAGNOSIS — D631 Anemia in chronic kidney disease: Secondary | ICD-10-CM | POA: Diagnosis not present

## 2020-09-28 DIAGNOSIS — R251 Tremor, unspecified: Secondary | ICD-10-CM | POA: Diagnosis not present

## 2020-09-28 DIAGNOSIS — N186 End stage renal disease: Secondary | ICD-10-CM | POA: Diagnosis not present

## 2020-09-28 DIAGNOSIS — Z23 Encounter for immunization: Secondary | ICD-10-CM | POA: Diagnosis not present

## 2020-09-28 DIAGNOSIS — I219 Acute myocardial infarction, unspecified: Secondary | ICD-10-CM | POA: Diagnosis not present

## 2020-09-28 DIAGNOSIS — Z4781 Encounter for orthopedic aftercare following surgical amputation: Secondary | ICD-10-CM | POA: Diagnosis not present

## 2020-09-28 DIAGNOSIS — I2510 Atherosclerotic heart disease of native coronary artery without angina pectoris: Secondary | ICD-10-CM | POA: Diagnosis not present

## 2020-09-28 DIAGNOSIS — D509 Iron deficiency anemia, unspecified: Secondary | ICD-10-CM | POA: Diagnosis not present

## 2020-09-28 DIAGNOSIS — G4733 Obstructive sleep apnea (adult) (pediatric): Secondary | ICD-10-CM | POA: Diagnosis not present

## 2020-09-28 DIAGNOSIS — E1140 Type 2 diabetes mellitus with diabetic neuropathy, unspecified: Secondary | ICD-10-CM | POA: Diagnosis not present

## 2020-09-29 DIAGNOSIS — N2581 Secondary hyperparathyroidism of renal origin: Secondary | ICD-10-CM | POA: Diagnosis not present

## 2020-09-29 DIAGNOSIS — N186 End stage renal disease: Secondary | ICD-10-CM | POA: Diagnosis not present

## 2020-09-29 DIAGNOSIS — D509 Iron deficiency anemia, unspecified: Secondary | ICD-10-CM | POA: Diagnosis not present

## 2020-09-29 DIAGNOSIS — Z23 Encounter for immunization: Secondary | ICD-10-CM | POA: Diagnosis not present

## 2020-10-02 DIAGNOSIS — D509 Iron deficiency anemia, unspecified: Secondary | ICD-10-CM | POA: Diagnosis not present

## 2020-10-02 DIAGNOSIS — N2581 Secondary hyperparathyroidism of renal origin: Secondary | ICD-10-CM | POA: Diagnosis not present

## 2020-10-02 DIAGNOSIS — N186 End stage renal disease: Secondary | ICD-10-CM | POA: Diagnosis not present

## 2020-10-02 DIAGNOSIS — Z23 Encounter for immunization: Secondary | ICD-10-CM | POA: Diagnosis not present

## 2020-10-04 DIAGNOSIS — N186 End stage renal disease: Secondary | ICD-10-CM | POA: Diagnosis not present

## 2020-10-04 DIAGNOSIS — D509 Iron deficiency anemia, unspecified: Secondary | ICD-10-CM | POA: Diagnosis not present

## 2020-10-04 DIAGNOSIS — N2581 Secondary hyperparathyroidism of renal origin: Secondary | ICD-10-CM | POA: Diagnosis not present

## 2020-10-04 DIAGNOSIS — Z23 Encounter for immunization: Secondary | ICD-10-CM | POA: Diagnosis not present

## 2020-10-05 DIAGNOSIS — Z4781 Encounter for orthopedic aftercare following surgical amputation: Secondary | ICD-10-CM | POA: Diagnosis not present

## 2020-10-05 DIAGNOSIS — G894 Chronic pain syndrome: Secondary | ICD-10-CM | POA: Diagnosis not present

## 2020-10-05 DIAGNOSIS — D631 Anemia in chronic kidney disease: Secondary | ICD-10-CM | POA: Diagnosis not present

## 2020-10-05 DIAGNOSIS — I219 Acute myocardial infarction, unspecified: Secondary | ICD-10-CM | POA: Diagnosis not present

## 2020-10-05 DIAGNOSIS — I2510 Atherosclerotic heart disease of native coronary artery without angina pectoris: Secondary | ICD-10-CM | POA: Diagnosis not present

## 2020-10-05 DIAGNOSIS — G4733 Obstructive sleep apnea (adult) (pediatric): Secondary | ICD-10-CM | POA: Diagnosis not present

## 2020-10-05 DIAGNOSIS — E1165 Type 2 diabetes mellitus with hyperglycemia: Secondary | ICD-10-CM | POA: Diagnosis not present

## 2020-10-05 DIAGNOSIS — E211 Secondary hyperparathyroidism, not elsewhere classified: Secondary | ICD-10-CM | POA: Diagnosis not present

## 2020-10-05 DIAGNOSIS — R251 Tremor, unspecified: Secondary | ICD-10-CM | POA: Diagnosis not present

## 2020-10-05 DIAGNOSIS — E1140 Type 2 diabetes mellitus with diabetic neuropathy, unspecified: Secondary | ICD-10-CM | POA: Diagnosis not present

## 2020-10-05 DIAGNOSIS — E1122 Type 2 diabetes mellitus with diabetic chronic kidney disease: Secondary | ICD-10-CM | POA: Diagnosis not present

## 2020-10-06 DIAGNOSIS — D509 Iron deficiency anemia, unspecified: Secondary | ICD-10-CM | POA: Diagnosis not present

## 2020-10-06 DIAGNOSIS — Z23 Encounter for immunization: Secondary | ICD-10-CM | POA: Diagnosis not present

## 2020-10-06 DIAGNOSIS — N2581 Secondary hyperparathyroidism of renal origin: Secondary | ICD-10-CM | POA: Diagnosis not present

## 2020-10-06 DIAGNOSIS — N186 End stage renal disease: Secondary | ICD-10-CM | POA: Diagnosis not present

## 2020-10-09 DIAGNOSIS — Z23 Encounter for immunization: Secondary | ICD-10-CM | POA: Diagnosis not present

## 2020-10-09 DIAGNOSIS — N2581 Secondary hyperparathyroidism of renal origin: Secondary | ICD-10-CM | POA: Diagnosis not present

## 2020-10-09 DIAGNOSIS — N186 End stage renal disease: Secondary | ICD-10-CM | POA: Diagnosis not present

## 2020-10-09 DIAGNOSIS — D509 Iron deficiency anemia, unspecified: Secondary | ICD-10-CM | POA: Diagnosis not present

## 2020-10-11 DIAGNOSIS — D509 Iron deficiency anemia, unspecified: Secondary | ICD-10-CM | POA: Diagnosis not present

## 2020-10-11 DIAGNOSIS — N2581 Secondary hyperparathyroidism of renal origin: Secondary | ICD-10-CM | POA: Diagnosis not present

## 2020-10-11 DIAGNOSIS — Z23 Encounter for immunization: Secondary | ICD-10-CM | POA: Diagnosis not present

## 2020-10-11 DIAGNOSIS — R2681 Unsteadiness on feet: Secondary | ICD-10-CM | POA: Diagnosis not present

## 2020-10-11 DIAGNOSIS — Z4781 Encounter for orthopedic aftercare following surgical amputation: Secondary | ICD-10-CM | POA: Diagnosis not present

## 2020-10-11 DIAGNOSIS — R251 Tremor, unspecified: Secondary | ICD-10-CM | POA: Diagnosis not present

## 2020-10-11 DIAGNOSIS — I219 Acute myocardial infarction, unspecified: Secondary | ICD-10-CM | POA: Diagnosis not present

## 2020-10-11 DIAGNOSIS — R2689 Other abnormalities of gait and mobility: Secondary | ICD-10-CM | POA: Diagnosis not present

## 2020-10-11 DIAGNOSIS — M6281 Muscle weakness (generalized): Secondary | ICD-10-CM | POA: Diagnosis not present

## 2020-10-11 DIAGNOSIS — N186 End stage renal disease: Secondary | ICD-10-CM | POA: Diagnosis not present

## 2020-10-12 DIAGNOSIS — R2681 Unsteadiness on feet: Secondary | ICD-10-CM | POA: Diagnosis not present

## 2020-10-12 DIAGNOSIS — M6281 Muscle weakness (generalized): Secondary | ICD-10-CM | POA: Diagnosis not present

## 2020-10-12 DIAGNOSIS — I2510 Atherosclerotic heart disease of native coronary artery without angina pectoris: Secondary | ICD-10-CM | POA: Diagnosis not present

## 2020-10-12 DIAGNOSIS — G4733 Obstructive sleep apnea (adult) (pediatric): Secondary | ICD-10-CM | POA: Diagnosis not present

## 2020-10-12 DIAGNOSIS — Z4781 Encounter for orthopedic aftercare following surgical amputation: Secondary | ICD-10-CM | POA: Diagnosis not present

## 2020-10-12 DIAGNOSIS — R2689 Other abnormalities of gait and mobility: Secondary | ICD-10-CM | POA: Diagnosis not present

## 2020-10-12 DIAGNOSIS — E1140 Type 2 diabetes mellitus with diabetic neuropathy, unspecified: Secondary | ICD-10-CM | POA: Diagnosis not present

## 2020-10-12 DIAGNOSIS — R251 Tremor, unspecified: Secondary | ICD-10-CM | POA: Diagnosis not present

## 2020-10-12 DIAGNOSIS — G894 Chronic pain syndrome: Secondary | ICD-10-CM | POA: Diagnosis not present

## 2020-10-12 DIAGNOSIS — I219 Acute myocardial infarction, unspecified: Secondary | ICD-10-CM | POA: Diagnosis not present

## 2020-10-12 DIAGNOSIS — E1165 Type 2 diabetes mellitus with hyperglycemia: Secondary | ICD-10-CM | POA: Diagnosis not present

## 2020-10-12 DIAGNOSIS — D631 Anemia in chronic kidney disease: Secondary | ICD-10-CM | POA: Diagnosis not present

## 2020-10-12 DIAGNOSIS — E211 Secondary hyperparathyroidism, not elsewhere classified: Secondary | ICD-10-CM | POA: Diagnosis not present

## 2020-10-12 DIAGNOSIS — E1122 Type 2 diabetes mellitus with diabetic chronic kidney disease: Secondary | ICD-10-CM | POA: Diagnosis not present

## 2020-10-13 DIAGNOSIS — Z23 Encounter for immunization: Secondary | ICD-10-CM | POA: Diagnosis not present

## 2020-10-13 DIAGNOSIS — R2689 Other abnormalities of gait and mobility: Secondary | ICD-10-CM | POA: Diagnosis not present

## 2020-10-13 DIAGNOSIS — Z4781 Encounter for orthopedic aftercare following surgical amputation: Secondary | ICD-10-CM | POA: Diagnosis not present

## 2020-10-13 DIAGNOSIS — R251 Tremor, unspecified: Secondary | ICD-10-CM | POA: Diagnosis not present

## 2020-10-13 DIAGNOSIS — I219 Acute myocardial infarction, unspecified: Secondary | ICD-10-CM | POA: Diagnosis not present

## 2020-10-13 DIAGNOSIS — R2681 Unsteadiness on feet: Secondary | ICD-10-CM | POA: Diagnosis not present

## 2020-10-13 DIAGNOSIS — M6281 Muscle weakness (generalized): Secondary | ICD-10-CM | POA: Diagnosis not present

## 2020-10-13 DIAGNOSIS — N2581 Secondary hyperparathyroidism of renal origin: Secondary | ICD-10-CM | POA: Diagnosis not present

## 2020-10-13 DIAGNOSIS — D509 Iron deficiency anemia, unspecified: Secondary | ICD-10-CM | POA: Diagnosis not present

## 2020-10-13 DIAGNOSIS — N186 End stage renal disease: Secondary | ICD-10-CM | POA: Diagnosis not present

## 2020-10-16 DIAGNOSIS — M6281 Muscle weakness (generalized): Secondary | ICD-10-CM | POA: Diagnosis not present

## 2020-10-16 DIAGNOSIS — Z4781 Encounter for orthopedic aftercare following surgical amputation: Secondary | ICD-10-CM | POA: Diagnosis not present

## 2020-10-16 DIAGNOSIS — R2689 Other abnormalities of gait and mobility: Secondary | ICD-10-CM | POA: Diagnosis not present

## 2020-10-16 DIAGNOSIS — D509 Iron deficiency anemia, unspecified: Secondary | ICD-10-CM | POA: Diagnosis not present

## 2020-10-16 DIAGNOSIS — I219 Acute myocardial infarction, unspecified: Secondary | ICD-10-CM | POA: Diagnosis not present

## 2020-10-16 DIAGNOSIS — N2581 Secondary hyperparathyroidism of renal origin: Secondary | ICD-10-CM | POA: Diagnosis not present

## 2020-10-16 DIAGNOSIS — N186 End stage renal disease: Secondary | ICD-10-CM | POA: Diagnosis not present

## 2020-10-16 DIAGNOSIS — R2681 Unsteadiness on feet: Secondary | ICD-10-CM | POA: Diagnosis not present

## 2020-10-16 DIAGNOSIS — Z23 Encounter for immunization: Secondary | ICD-10-CM | POA: Diagnosis not present

## 2020-10-16 DIAGNOSIS — R251 Tremor, unspecified: Secondary | ICD-10-CM | POA: Diagnosis not present

## 2020-10-17 DIAGNOSIS — I219 Acute myocardial infarction, unspecified: Secondary | ICD-10-CM | POA: Diagnosis not present

## 2020-10-17 DIAGNOSIS — R251 Tremor, unspecified: Secondary | ICD-10-CM | POA: Diagnosis not present

## 2020-10-17 DIAGNOSIS — R2681 Unsteadiness on feet: Secondary | ICD-10-CM | POA: Diagnosis not present

## 2020-10-17 DIAGNOSIS — Z4781 Encounter for orthopedic aftercare following surgical amputation: Secondary | ICD-10-CM | POA: Diagnosis not present

## 2020-10-17 DIAGNOSIS — M6281 Muscle weakness (generalized): Secondary | ICD-10-CM | POA: Diagnosis not present

## 2020-10-17 DIAGNOSIS — R2689 Other abnormalities of gait and mobility: Secondary | ICD-10-CM | POA: Diagnosis not present

## 2020-10-18 DIAGNOSIS — R251 Tremor, unspecified: Secondary | ICD-10-CM | POA: Diagnosis not present

## 2020-10-18 DIAGNOSIS — N2581 Secondary hyperparathyroidism of renal origin: Secondary | ICD-10-CM | POA: Diagnosis not present

## 2020-10-18 DIAGNOSIS — M6281 Muscle weakness (generalized): Secondary | ICD-10-CM | POA: Diagnosis not present

## 2020-10-18 DIAGNOSIS — N186 End stage renal disease: Secondary | ICD-10-CM | POA: Diagnosis not present

## 2020-10-18 DIAGNOSIS — D509 Iron deficiency anemia, unspecified: Secondary | ICD-10-CM | POA: Diagnosis not present

## 2020-10-18 DIAGNOSIS — Z4781 Encounter for orthopedic aftercare following surgical amputation: Secondary | ICD-10-CM | POA: Diagnosis not present

## 2020-10-18 DIAGNOSIS — R2681 Unsteadiness on feet: Secondary | ICD-10-CM | POA: Diagnosis not present

## 2020-10-18 DIAGNOSIS — I219 Acute myocardial infarction, unspecified: Secondary | ICD-10-CM | POA: Diagnosis not present

## 2020-10-18 DIAGNOSIS — R2689 Other abnormalities of gait and mobility: Secondary | ICD-10-CM | POA: Diagnosis not present

## 2020-10-18 DIAGNOSIS — Z23 Encounter for immunization: Secondary | ICD-10-CM | POA: Diagnosis not present

## 2020-10-19 DIAGNOSIS — E1140 Type 2 diabetes mellitus with diabetic neuropathy, unspecified: Secondary | ICD-10-CM | POA: Diagnosis not present

## 2020-10-19 DIAGNOSIS — I2510 Atherosclerotic heart disease of native coronary artery without angina pectoris: Secondary | ICD-10-CM | POA: Diagnosis not present

## 2020-10-19 DIAGNOSIS — R2689 Other abnormalities of gait and mobility: Secondary | ICD-10-CM | POA: Diagnosis not present

## 2020-10-19 DIAGNOSIS — R251 Tremor, unspecified: Secondary | ICD-10-CM | POA: Diagnosis not present

## 2020-10-19 DIAGNOSIS — Z4781 Encounter for orthopedic aftercare following surgical amputation: Secondary | ICD-10-CM | POA: Diagnosis not present

## 2020-10-19 DIAGNOSIS — M6281 Muscle weakness (generalized): Secondary | ICD-10-CM | POA: Diagnosis not present

## 2020-10-19 DIAGNOSIS — E1122 Type 2 diabetes mellitus with diabetic chronic kidney disease: Secondary | ICD-10-CM | POA: Diagnosis not present

## 2020-10-19 DIAGNOSIS — G4733 Obstructive sleep apnea (adult) (pediatric): Secondary | ICD-10-CM | POA: Diagnosis not present

## 2020-10-19 DIAGNOSIS — E1165 Type 2 diabetes mellitus with hyperglycemia: Secondary | ICD-10-CM | POA: Diagnosis not present

## 2020-10-19 DIAGNOSIS — R2681 Unsteadiness on feet: Secondary | ICD-10-CM | POA: Diagnosis not present

## 2020-10-19 DIAGNOSIS — G894 Chronic pain syndrome: Secondary | ICD-10-CM | POA: Diagnosis not present

## 2020-10-19 DIAGNOSIS — I219 Acute myocardial infarction, unspecified: Secondary | ICD-10-CM | POA: Diagnosis not present

## 2020-10-19 DIAGNOSIS — D631 Anemia in chronic kidney disease: Secondary | ICD-10-CM | POA: Diagnosis not present

## 2020-10-19 DIAGNOSIS — E211 Secondary hyperparathyroidism, not elsewhere classified: Secondary | ICD-10-CM | POA: Diagnosis not present

## 2020-10-20 DIAGNOSIS — R2681 Unsteadiness on feet: Secondary | ICD-10-CM | POA: Diagnosis not present

## 2020-10-20 DIAGNOSIS — M6281 Muscle weakness (generalized): Secondary | ICD-10-CM | POA: Diagnosis not present

## 2020-10-20 DIAGNOSIS — Z4781 Encounter for orthopedic aftercare following surgical amputation: Secondary | ICD-10-CM | POA: Diagnosis not present

## 2020-10-20 DIAGNOSIS — R251 Tremor, unspecified: Secondary | ICD-10-CM | POA: Diagnosis not present

## 2020-10-20 DIAGNOSIS — I219 Acute myocardial infarction, unspecified: Secondary | ICD-10-CM | POA: Diagnosis not present

## 2020-10-20 DIAGNOSIS — Z23 Encounter for immunization: Secondary | ICD-10-CM | POA: Diagnosis not present

## 2020-10-20 DIAGNOSIS — D509 Iron deficiency anemia, unspecified: Secondary | ICD-10-CM | POA: Diagnosis not present

## 2020-10-20 DIAGNOSIS — N2581 Secondary hyperparathyroidism of renal origin: Secondary | ICD-10-CM | POA: Diagnosis not present

## 2020-10-20 DIAGNOSIS — N186 End stage renal disease: Secondary | ICD-10-CM | POA: Diagnosis not present

## 2020-10-20 DIAGNOSIS — R2689 Other abnormalities of gait and mobility: Secondary | ICD-10-CM | POA: Diagnosis not present

## 2020-10-23 DIAGNOSIS — D509 Iron deficiency anemia, unspecified: Secondary | ICD-10-CM | POA: Diagnosis not present

## 2020-10-23 DIAGNOSIS — Z23 Encounter for immunization: Secondary | ICD-10-CM | POA: Diagnosis not present

## 2020-10-23 DIAGNOSIS — I219 Acute myocardial infarction, unspecified: Secondary | ICD-10-CM | POA: Diagnosis not present

## 2020-10-23 DIAGNOSIS — Z4781 Encounter for orthopedic aftercare following surgical amputation: Secondary | ICD-10-CM | POA: Diagnosis not present

## 2020-10-23 DIAGNOSIS — N2581 Secondary hyperparathyroidism of renal origin: Secondary | ICD-10-CM | POA: Diagnosis not present

## 2020-10-23 DIAGNOSIS — R251 Tremor, unspecified: Secondary | ICD-10-CM | POA: Diagnosis not present

## 2020-10-23 DIAGNOSIS — M6281 Muscle weakness (generalized): Secondary | ICD-10-CM | POA: Diagnosis not present

## 2020-10-23 DIAGNOSIS — R2681 Unsteadiness on feet: Secondary | ICD-10-CM | POA: Diagnosis not present

## 2020-10-23 DIAGNOSIS — N186 End stage renal disease: Secondary | ICD-10-CM | POA: Diagnosis not present

## 2020-10-23 DIAGNOSIS — R2689 Other abnormalities of gait and mobility: Secondary | ICD-10-CM | POA: Diagnosis not present

## 2020-10-24 DIAGNOSIS — R2689 Other abnormalities of gait and mobility: Secondary | ICD-10-CM | POA: Diagnosis not present

## 2020-10-24 DIAGNOSIS — I219 Acute myocardial infarction, unspecified: Secondary | ICD-10-CM | POA: Diagnosis not present

## 2020-10-24 DIAGNOSIS — R2681 Unsteadiness on feet: Secondary | ICD-10-CM | POA: Diagnosis not present

## 2020-10-24 DIAGNOSIS — M6281 Muscle weakness (generalized): Secondary | ICD-10-CM | POA: Diagnosis not present

## 2020-10-24 DIAGNOSIS — R251 Tremor, unspecified: Secondary | ICD-10-CM | POA: Diagnosis not present

## 2020-10-24 DIAGNOSIS — Z4781 Encounter for orthopedic aftercare following surgical amputation: Secondary | ICD-10-CM | POA: Diagnosis not present

## 2020-10-25 DIAGNOSIS — R2681 Unsteadiness on feet: Secondary | ICD-10-CM | POA: Diagnosis not present

## 2020-10-25 DIAGNOSIS — Z4781 Encounter for orthopedic aftercare following surgical amputation: Secondary | ICD-10-CM | POA: Diagnosis not present

## 2020-10-25 DIAGNOSIS — N186 End stage renal disease: Secondary | ICD-10-CM | POA: Diagnosis not present

## 2020-10-25 DIAGNOSIS — N2581 Secondary hyperparathyroidism of renal origin: Secondary | ICD-10-CM | POA: Diagnosis not present

## 2020-10-25 DIAGNOSIS — I219 Acute myocardial infarction, unspecified: Secondary | ICD-10-CM | POA: Diagnosis not present

## 2020-10-25 DIAGNOSIS — Z23 Encounter for immunization: Secondary | ICD-10-CM | POA: Diagnosis not present

## 2020-10-25 DIAGNOSIS — R2689 Other abnormalities of gait and mobility: Secondary | ICD-10-CM | POA: Diagnosis not present

## 2020-10-25 DIAGNOSIS — M6281 Muscle weakness (generalized): Secondary | ICD-10-CM | POA: Diagnosis not present

## 2020-10-25 DIAGNOSIS — D509 Iron deficiency anemia, unspecified: Secondary | ICD-10-CM | POA: Diagnosis not present

## 2020-10-25 DIAGNOSIS — R251 Tremor, unspecified: Secondary | ICD-10-CM | POA: Diagnosis not present

## 2020-10-26 DIAGNOSIS — E1140 Type 2 diabetes mellitus with diabetic neuropathy, unspecified: Secondary | ICD-10-CM | POA: Diagnosis not present

## 2020-10-26 DIAGNOSIS — Z4781 Encounter for orthopedic aftercare following surgical amputation: Secondary | ICD-10-CM | POA: Diagnosis not present

## 2020-10-26 DIAGNOSIS — I2510 Atherosclerotic heart disease of native coronary artery without angina pectoris: Secondary | ICD-10-CM | POA: Diagnosis not present

## 2020-10-26 DIAGNOSIS — R251 Tremor, unspecified: Secondary | ICD-10-CM | POA: Diagnosis not present

## 2020-10-26 DIAGNOSIS — R2689 Other abnormalities of gait and mobility: Secondary | ICD-10-CM | POA: Diagnosis not present

## 2020-10-26 DIAGNOSIS — R2681 Unsteadiness on feet: Secondary | ICD-10-CM | POA: Diagnosis not present

## 2020-10-26 DIAGNOSIS — D631 Anemia in chronic kidney disease: Secondary | ICD-10-CM | POA: Diagnosis not present

## 2020-10-26 DIAGNOSIS — M6281 Muscle weakness (generalized): Secondary | ICD-10-CM | POA: Diagnosis not present

## 2020-10-26 DIAGNOSIS — E1165 Type 2 diabetes mellitus with hyperglycemia: Secondary | ICD-10-CM | POA: Diagnosis not present

## 2020-10-26 DIAGNOSIS — G4733 Obstructive sleep apnea (adult) (pediatric): Secondary | ICD-10-CM | POA: Diagnosis not present

## 2020-10-26 DIAGNOSIS — I219 Acute myocardial infarction, unspecified: Secondary | ICD-10-CM | POA: Diagnosis not present

## 2020-10-26 DIAGNOSIS — E211 Secondary hyperparathyroidism, not elsewhere classified: Secondary | ICD-10-CM | POA: Diagnosis not present

## 2020-10-26 DIAGNOSIS — G894 Chronic pain syndrome: Secondary | ICD-10-CM | POA: Diagnosis not present

## 2020-10-26 DIAGNOSIS — E1122 Type 2 diabetes mellitus with diabetic chronic kidney disease: Secondary | ICD-10-CM | POA: Diagnosis not present

## 2020-10-27 DIAGNOSIS — I219 Acute myocardial infarction, unspecified: Secondary | ICD-10-CM | POA: Diagnosis not present

## 2020-10-27 DIAGNOSIS — R251 Tremor, unspecified: Secondary | ICD-10-CM | POA: Diagnosis not present

## 2020-10-27 DIAGNOSIS — R2689 Other abnormalities of gait and mobility: Secondary | ICD-10-CM | POA: Diagnosis not present

## 2020-10-27 DIAGNOSIS — Z23 Encounter for immunization: Secondary | ICD-10-CM | POA: Diagnosis not present

## 2020-10-27 DIAGNOSIS — M6281 Muscle weakness (generalized): Secondary | ICD-10-CM | POA: Diagnosis not present

## 2020-10-27 DIAGNOSIS — R2681 Unsteadiness on feet: Secondary | ICD-10-CM | POA: Diagnosis not present

## 2020-10-27 DIAGNOSIS — D509 Iron deficiency anemia, unspecified: Secondary | ICD-10-CM | POA: Diagnosis not present

## 2020-10-27 DIAGNOSIS — N2581 Secondary hyperparathyroidism of renal origin: Secondary | ICD-10-CM | POA: Diagnosis not present

## 2020-10-27 DIAGNOSIS — Z4781 Encounter for orthopedic aftercare following surgical amputation: Secondary | ICD-10-CM | POA: Diagnosis not present

## 2020-10-27 DIAGNOSIS — N186 End stage renal disease: Secondary | ICD-10-CM | POA: Diagnosis not present

## 2020-10-30 DIAGNOSIS — N2581 Secondary hyperparathyroidism of renal origin: Secondary | ICD-10-CM | POA: Diagnosis not present

## 2020-10-30 DIAGNOSIS — R251 Tremor, unspecified: Secondary | ICD-10-CM | POA: Diagnosis not present

## 2020-10-30 DIAGNOSIS — E1165 Type 2 diabetes mellitus with hyperglycemia: Secondary | ICD-10-CM | POA: Diagnosis not present

## 2020-10-30 DIAGNOSIS — R2681 Unsteadiness on feet: Secondary | ICD-10-CM | POA: Diagnosis not present

## 2020-10-30 DIAGNOSIS — E1140 Type 2 diabetes mellitus with diabetic neuropathy, unspecified: Secondary | ICD-10-CM | POA: Diagnosis not present

## 2020-10-30 DIAGNOSIS — I2510 Atherosclerotic heart disease of native coronary artery without angina pectoris: Secondary | ICD-10-CM | POA: Diagnosis not present

## 2020-10-30 DIAGNOSIS — Z4781 Encounter for orthopedic aftercare following surgical amputation: Secondary | ICD-10-CM | POA: Diagnosis not present

## 2020-10-30 DIAGNOSIS — I219 Acute myocardial infarction, unspecified: Secondary | ICD-10-CM | POA: Diagnosis not present

## 2020-10-30 DIAGNOSIS — D631 Anemia in chronic kidney disease: Secondary | ICD-10-CM | POA: Diagnosis not present

## 2020-10-30 DIAGNOSIS — N186 End stage renal disease: Secondary | ICD-10-CM | POA: Diagnosis not present

## 2020-10-30 DIAGNOSIS — R2689 Other abnormalities of gait and mobility: Secondary | ICD-10-CM | POA: Diagnosis not present

## 2020-10-30 DIAGNOSIS — E211 Secondary hyperparathyroidism, not elsewhere classified: Secondary | ICD-10-CM | POA: Diagnosis not present

## 2020-10-30 DIAGNOSIS — G4733 Obstructive sleep apnea (adult) (pediatric): Secondary | ICD-10-CM | POA: Diagnosis not present

## 2020-10-30 DIAGNOSIS — G894 Chronic pain syndrome: Secondary | ICD-10-CM | POA: Diagnosis not present

## 2020-10-30 DIAGNOSIS — E1122 Type 2 diabetes mellitus with diabetic chronic kidney disease: Secondary | ICD-10-CM | POA: Diagnosis not present

## 2020-10-30 DIAGNOSIS — D509 Iron deficiency anemia, unspecified: Secondary | ICD-10-CM | POA: Diagnosis not present

## 2020-10-30 DIAGNOSIS — M6281 Muscle weakness (generalized): Secondary | ICD-10-CM | POA: Diagnosis not present

## 2020-11-01 DIAGNOSIS — N2581 Secondary hyperparathyroidism of renal origin: Secondary | ICD-10-CM | POA: Diagnosis not present

## 2020-11-01 DIAGNOSIS — D509 Iron deficiency anemia, unspecified: Secondary | ICD-10-CM | POA: Diagnosis not present

## 2020-11-01 DIAGNOSIS — N186 End stage renal disease: Secondary | ICD-10-CM | POA: Diagnosis not present

## 2020-11-02 DIAGNOSIS — R2681 Unsteadiness on feet: Secondary | ICD-10-CM | POA: Diagnosis not present

## 2020-11-02 DIAGNOSIS — E211 Secondary hyperparathyroidism, not elsewhere classified: Secondary | ICD-10-CM | POA: Diagnosis not present

## 2020-11-02 DIAGNOSIS — D631 Anemia in chronic kidney disease: Secondary | ICD-10-CM | POA: Diagnosis not present

## 2020-11-02 DIAGNOSIS — I219 Acute myocardial infarction, unspecified: Secondary | ICD-10-CM | POA: Diagnosis not present

## 2020-11-02 DIAGNOSIS — Z4781 Encounter for orthopedic aftercare following surgical amputation: Secondary | ICD-10-CM | POA: Diagnosis not present

## 2020-11-02 DIAGNOSIS — E1165 Type 2 diabetes mellitus with hyperglycemia: Secondary | ICD-10-CM | POA: Diagnosis not present

## 2020-11-02 DIAGNOSIS — M6281 Muscle weakness (generalized): Secondary | ICD-10-CM | POA: Diagnosis not present

## 2020-11-02 DIAGNOSIS — R2689 Other abnormalities of gait and mobility: Secondary | ICD-10-CM | POA: Diagnosis not present

## 2020-11-02 DIAGNOSIS — R251 Tremor, unspecified: Secondary | ICD-10-CM | POA: Diagnosis not present

## 2020-11-02 DIAGNOSIS — G894 Chronic pain syndrome: Secondary | ICD-10-CM | POA: Diagnosis not present

## 2020-11-02 DIAGNOSIS — E1140 Type 2 diabetes mellitus with diabetic neuropathy, unspecified: Secondary | ICD-10-CM | POA: Diagnosis not present

## 2020-11-02 DIAGNOSIS — I2510 Atherosclerotic heart disease of native coronary artery without angina pectoris: Secondary | ICD-10-CM | POA: Diagnosis not present

## 2020-11-02 DIAGNOSIS — G4733 Obstructive sleep apnea (adult) (pediatric): Secondary | ICD-10-CM | POA: Diagnosis not present

## 2020-11-02 DIAGNOSIS — E1122 Type 2 diabetes mellitus with diabetic chronic kidney disease: Secondary | ICD-10-CM | POA: Diagnosis not present

## 2020-11-03 DIAGNOSIS — N2581 Secondary hyperparathyroidism of renal origin: Secondary | ICD-10-CM | POA: Diagnosis not present

## 2020-11-03 DIAGNOSIS — N186 End stage renal disease: Secondary | ICD-10-CM | POA: Diagnosis not present

## 2020-11-03 DIAGNOSIS — D509 Iron deficiency anemia, unspecified: Secondary | ICD-10-CM | POA: Diagnosis not present

## 2020-11-06 DIAGNOSIS — D509 Iron deficiency anemia, unspecified: Secondary | ICD-10-CM | POA: Diagnosis not present

## 2020-11-06 DIAGNOSIS — N2581 Secondary hyperparathyroidism of renal origin: Secondary | ICD-10-CM | POA: Diagnosis not present

## 2020-11-06 DIAGNOSIS — N186 End stage renal disease: Secondary | ICD-10-CM | POA: Diagnosis not present

## 2020-11-08 DIAGNOSIS — N2581 Secondary hyperparathyroidism of renal origin: Secondary | ICD-10-CM | POA: Diagnosis not present

## 2020-11-08 DIAGNOSIS — N186 End stage renal disease: Secondary | ICD-10-CM | POA: Diagnosis not present

## 2020-11-08 DIAGNOSIS — D509 Iron deficiency anemia, unspecified: Secondary | ICD-10-CM | POA: Diagnosis not present

## 2020-11-09 DIAGNOSIS — E211 Secondary hyperparathyroidism, not elsewhere classified: Secondary | ICD-10-CM | POA: Diagnosis not present

## 2020-11-09 DIAGNOSIS — E1122 Type 2 diabetes mellitus with diabetic chronic kidney disease: Secondary | ICD-10-CM | POA: Diagnosis not present

## 2020-11-09 DIAGNOSIS — G894 Chronic pain syndrome: Secondary | ICD-10-CM | POA: Diagnosis not present

## 2020-11-09 DIAGNOSIS — E1165 Type 2 diabetes mellitus with hyperglycemia: Secondary | ICD-10-CM | POA: Diagnosis not present

## 2020-11-09 DIAGNOSIS — D631 Anemia in chronic kidney disease: Secondary | ICD-10-CM | POA: Diagnosis not present

## 2020-11-09 DIAGNOSIS — M6281 Muscle weakness (generalized): Secondary | ICD-10-CM | POA: Diagnosis not present

## 2020-11-09 DIAGNOSIS — I219 Acute myocardial infarction, unspecified: Secondary | ICD-10-CM | POA: Diagnosis not present

## 2020-11-09 DIAGNOSIS — G4733 Obstructive sleep apnea (adult) (pediatric): Secondary | ICD-10-CM | POA: Diagnosis not present

## 2020-11-09 DIAGNOSIS — E1140 Type 2 diabetes mellitus with diabetic neuropathy, unspecified: Secondary | ICD-10-CM | POA: Diagnosis not present

## 2020-11-09 DIAGNOSIS — R251 Tremor, unspecified: Secondary | ICD-10-CM | POA: Diagnosis not present

## 2020-11-09 DIAGNOSIS — R2689 Other abnormalities of gait and mobility: Secondary | ICD-10-CM | POA: Diagnosis not present

## 2020-11-09 DIAGNOSIS — Z4781 Encounter for orthopedic aftercare following surgical amputation: Secondary | ICD-10-CM | POA: Diagnosis not present

## 2020-11-09 DIAGNOSIS — I2510 Atherosclerotic heart disease of native coronary artery without angina pectoris: Secondary | ICD-10-CM | POA: Diagnosis not present

## 2020-11-09 DIAGNOSIS — R2681 Unsteadiness on feet: Secondary | ICD-10-CM | POA: Diagnosis not present

## 2020-11-10 DIAGNOSIS — N186 End stage renal disease: Secondary | ICD-10-CM | POA: Diagnosis not present

## 2020-11-10 DIAGNOSIS — D509 Iron deficiency anemia, unspecified: Secondary | ICD-10-CM | POA: Diagnosis not present

## 2020-11-10 DIAGNOSIS — N2581 Secondary hyperparathyroidism of renal origin: Secondary | ICD-10-CM | POA: Diagnosis not present

## 2020-11-13 DIAGNOSIS — N186 End stage renal disease: Secondary | ICD-10-CM | POA: Diagnosis not present

## 2020-11-13 DIAGNOSIS — D509 Iron deficiency anemia, unspecified: Secondary | ICD-10-CM | POA: Diagnosis not present

## 2020-11-13 DIAGNOSIS — N2581 Secondary hyperparathyroidism of renal origin: Secondary | ICD-10-CM | POA: Diagnosis not present

## 2020-11-15 DIAGNOSIS — N186 End stage renal disease: Secondary | ICD-10-CM | POA: Diagnosis not present

## 2020-11-15 DIAGNOSIS — D509 Iron deficiency anemia, unspecified: Secondary | ICD-10-CM | POA: Diagnosis not present

## 2020-11-15 DIAGNOSIS — N2581 Secondary hyperparathyroidism of renal origin: Secondary | ICD-10-CM | POA: Diagnosis not present

## 2020-11-16 DIAGNOSIS — E1165 Type 2 diabetes mellitus with hyperglycemia: Secondary | ICD-10-CM | POA: Diagnosis not present

## 2020-11-16 DIAGNOSIS — E1122 Type 2 diabetes mellitus with diabetic chronic kidney disease: Secondary | ICD-10-CM | POA: Diagnosis not present

## 2020-11-16 DIAGNOSIS — Z4781 Encounter for orthopedic aftercare following surgical amputation: Secondary | ICD-10-CM | POA: Diagnosis not present

## 2020-11-16 DIAGNOSIS — I219 Acute myocardial infarction, unspecified: Secondary | ICD-10-CM | POA: Diagnosis not present

## 2020-11-16 DIAGNOSIS — D631 Anemia in chronic kidney disease: Secondary | ICD-10-CM | POA: Diagnosis not present

## 2020-11-16 DIAGNOSIS — M6281 Muscle weakness (generalized): Secondary | ICD-10-CM | POA: Diagnosis not present

## 2020-11-16 DIAGNOSIS — R251 Tremor, unspecified: Secondary | ICD-10-CM | POA: Diagnosis not present

## 2020-11-16 DIAGNOSIS — R2681 Unsteadiness on feet: Secondary | ICD-10-CM | POA: Diagnosis not present

## 2020-11-16 DIAGNOSIS — G4733 Obstructive sleep apnea (adult) (pediatric): Secondary | ICD-10-CM | POA: Diagnosis not present

## 2020-11-16 DIAGNOSIS — G894 Chronic pain syndrome: Secondary | ICD-10-CM | POA: Diagnosis not present

## 2020-11-16 DIAGNOSIS — R2689 Other abnormalities of gait and mobility: Secondary | ICD-10-CM | POA: Diagnosis not present

## 2020-11-16 DIAGNOSIS — E1140 Type 2 diabetes mellitus with diabetic neuropathy, unspecified: Secondary | ICD-10-CM | POA: Diagnosis not present

## 2020-11-16 DIAGNOSIS — E211 Secondary hyperparathyroidism, not elsewhere classified: Secondary | ICD-10-CM | POA: Diagnosis not present

## 2020-11-16 DIAGNOSIS — I2510 Atherosclerotic heart disease of native coronary artery without angina pectoris: Secondary | ICD-10-CM | POA: Diagnosis not present

## 2020-11-17 DIAGNOSIS — N186 End stage renal disease: Secondary | ICD-10-CM | POA: Diagnosis not present

## 2020-11-17 DIAGNOSIS — D509 Iron deficiency anemia, unspecified: Secondary | ICD-10-CM | POA: Diagnosis not present

## 2020-11-17 DIAGNOSIS — N2581 Secondary hyperparathyroidism of renal origin: Secondary | ICD-10-CM | POA: Diagnosis not present

## 2020-11-19 DIAGNOSIS — N2581 Secondary hyperparathyroidism of renal origin: Secondary | ICD-10-CM | POA: Diagnosis not present

## 2020-11-19 DIAGNOSIS — N186 End stage renal disease: Secondary | ICD-10-CM | POA: Diagnosis not present

## 2020-11-19 DIAGNOSIS — D509 Iron deficiency anemia, unspecified: Secondary | ICD-10-CM | POA: Diagnosis not present

## 2020-11-21 DIAGNOSIS — N186 End stage renal disease: Secondary | ICD-10-CM | POA: Diagnosis not present

## 2020-11-21 DIAGNOSIS — D509 Iron deficiency anemia, unspecified: Secondary | ICD-10-CM | POA: Diagnosis not present

## 2020-11-21 DIAGNOSIS — N2581 Secondary hyperparathyroidism of renal origin: Secondary | ICD-10-CM | POA: Diagnosis not present

## 2020-11-23 DIAGNOSIS — I219 Acute myocardial infarction, unspecified: Secondary | ICD-10-CM | POA: Diagnosis not present

## 2020-11-23 DIAGNOSIS — E1122 Type 2 diabetes mellitus with diabetic chronic kidney disease: Secondary | ICD-10-CM | POA: Diagnosis not present

## 2020-11-23 DIAGNOSIS — Z4781 Encounter for orthopedic aftercare following surgical amputation: Secondary | ICD-10-CM | POA: Diagnosis not present

## 2020-11-23 DIAGNOSIS — E211 Secondary hyperparathyroidism, not elsewhere classified: Secondary | ICD-10-CM | POA: Diagnosis not present

## 2020-11-23 DIAGNOSIS — D631 Anemia in chronic kidney disease: Secondary | ICD-10-CM | POA: Diagnosis not present

## 2020-11-23 DIAGNOSIS — E1140 Type 2 diabetes mellitus with diabetic neuropathy, unspecified: Secondary | ICD-10-CM | POA: Diagnosis not present

## 2020-11-23 DIAGNOSIS — R251 Tremor, unspecified: Secondary | ICD-10-CM | POA: Diagnosis not present

## 2020-11-23 DIAGNOSIS — G4733 Obstructive sleep apnea (adult) (pediatric): Secondary | ICD-10-CM | POA: Diagnosis not present

## 2020-11-23 DIAGNOSIS — G894 Chronic pain syndrome: Secondary | ICD-10-CM | POA: Diagnosis not present

## 2020-11-23 DIAGNOSIS — E1165 Type 2 diabetes mellitus with hyperglycemia: Secondary | ICD-10-CM | POA: Diagnosis not present

## 2020-11-23 DIAGNOSIS — I2510 Atherosclerotic heart disease of native coronary artery without angina pectoris: Secondary | ICD-10-CM | POA: Diagnosis not present

## 2020-11-24 DIAGNOSIS — N2581 Secondary hyperparathyroidism of renal origin: Secondary | ICD-10-CM | POA: Diagnosis not present

## 2020-11-24 DIAGNOSIS — N186 End stage renal disease: Secondary | ICD-10-CM | POA: Diagnosis not present

## 2020-11-24 DIAGNOSIS — D509 Iron deficiency anemia, unspecified: Secondary | ICD-10-CM | POA: Diagnosis not present

## 2020-11-27 DIAGNOSIS — N2581 Secondary hyperparathyroidism of renal origin: Secondary | ICD-10-CM | POA: Diagnosis not present

## 2020-11-27 DIAGNOSIS — D509 Iron deficiency anemia, unspecified: Secondary | ICD-10-CM | POA: Diagnosis not present

## 2020-11-27 DIAGNOSIS — N186 End stage renal disease: Secondary | ICD-10-CM | POA: Diagnosis not present

## 2020-11-29 DIAGNOSIS — Z4781 Encounter for orthopedic aftercare following surgical amputation: Secondary | ICD-10-CM | POA: Diagnosis not present

## 2020-11-29 DIAGNOSIS — I219 Acute myocardial infarction, unspecified: Secondary | ICD-10-CM | POA: Diagnosis not present

## 2020-11-29 DIAGNOSIS — I2510 Atherosclerotic heart disease of native coronary artery without angina pectoris: Secondary | ICD-10-CM | POA: Diagnosis not present

## 2020-11-29 DIAGNOSIS — E1165 Type 2 diabetes mellitus with hyperglycemia: Secondary | ICD-10-CM | POA: Diagnosis not present

## 2020-11-29 DIAGNOSIS — R251 Tremor, unspecified: Secondary | ICD-10-CM | POA: Diagnosis not present

## 2020-11-29 DIAGNOSIS — E1140 Type 2 diabetes mellitus with diabetic neuropathy, unspecified: Secondary | ICD-10-CM | POA: Diagnosis not present

## 2020-11-29 DIAGNOSIS — G894 Chronic pain syndrome: Secondary | ICD-10-CM | POA: Diagnosis not present

## 2020-11-29 DIAGNOSIS — E1122 Type 2 diabetes mellitus with diabetic chronic kidney disease: Secondary | ICD-10-CM | POA: Diagnosis not present

## 2020-11-29 DIAGNOSIS — D631 Anemia in chronic kidney disease: Secondary | ICD-10-CM | POA: Diagnosis not present

## 2020-11-29 DIAGNOSIS — G4733 Obstructive sleep apnea (adult) (pediatric): Secondary | ICD-10-CM | POA: Diagnosis not present

## 2020-11-29 DIAGNOSIS — E211 Secondary hyperparathyroidism, not elsewhere classified: Secondary | ICD-10-CM | POA: Diagnosis not present

## 2020-12-07 DIAGNOSIS — G4733 Obstructive sleep apnea (adult) (pediatric): Secondary | ICD-10-CM | POA: Diagnosis not present

## 2020-12-07 DIAGNOSIS — D631 Anemia in chronic kidney disease: Secondary | ICD-10-CM | POA: Diagnosis not present

## 2020-12-07 DIAGNOSIS — E1122 Type 2 diabetes mellitus with diabetic chronic kidney disease: Secondary | ICD-10-CM | POA: Diagnosis not present

## 2020-12-07 DIAGNOSIS — G894 Chronic pain syndrome: Secondary | ICD-10-CM | POA: Diagnosis not present

## 2020-12-07 DIAGNOSIS — Z4781 Encounter for orthopedic aftercare following surgical amputation: Secondary | ICD-10-CM | POA: Diagnosis not present

## 2020-12-07 DIAGNOSIS — I2510 Atherosclerotic heart disease of native coronary artery without angina pectoris: Secondary | ICD-10-CM | POA: Diagnosis not present

## 2020-12-07 DIAGNOSIS — I219 Acute myocardial infarction, unspecified: Secondary | ICD-10-CM | POA: Diagnosis not present

## 2020-12-07 DIAGNOSIS — E211 Secondary hyperparathyroidism, not elsewhere classified: Secondary | ICD-10-CM | POA: Diagnosis not present

## 2020-12-07 DIAGNOSIS — E1165 Type 2 diabetes mellitus with hyperglycemia: Secondary | ICD-10-CM | POA: Diagnosis not present

## 2020-12-07 DIAGNOSIS — R251 Tremor, unspecified: Secondary | ICD-10-CM | POA: Diagnosis not present

## 2020-12-07 DIAGNOSIS — E1140 Type 2 diabetes mellitus with diabetic neuropathy, unspecified: Secondary | ICD-10-CM | POA: Diagnosis not present

## 2020-12-14 DIAGNOSIS — I219 Acute myocardial infarction, unspecified: Secondary | ICD-10-CM | POA: Diagnosis not present

## 2020-12-14 DIAGNOSIS — R251 Tremor, unspecified: Secondary | ICD-10-CM | POA: Diagnosis not present

## 2020-12-14 DIAGNOSIS — N186 End stage renal disease: Secondary | ICD-10-CM | POA: Diagnosis not present

## 2020-12-14 DIAGNOSIS — E1165 Type 2 diabetes mellitus with hyperglycemia: Secondary | ICD-10-CM | POA: Diagnosis not present

## 2020-12-14 DIAGNOSIS — G894 Chronic pain syndrome: Secondary | ICD-10-CM | POA: Diagnosis not present

## 2020-12-14 DIAGNOSIS — Z4781 Encounter for orthopedic aftercare following surgical amputation: Secondary | ICD-10-CM | POA: Diagnosis not present

## 2020-12-14 DIAGNOSIS — D631 Anemia in chronic kidney disease: Secondary | ICD-10-CM | POA: Diagnosis not present

## 2020-12-14 DIAGNOSIS — E1122 Type 2 diabetes mellitus with diabetic chronic kidney disease: Secondary | ICD-10-CM | POA: Diagnosis not present

## 2020-12-14 DIAGNOSIS — E211 Secondary hyperparathyroidism, not elsewhere classified: Secondary | ICD-10-CM | POA: Diagnosis not present

## 2020-12-14 DIAGNOSIS — I2510 Atherosclerotic heart disease of native coronary artery without angina pectoris: Secondary | ICD-10-CM | POA: Diagnosis not present

## 2020-12-14 DIAGNOSIS — G4733 Obstructive sleep apnea (adult) (pediatric): Secondary | ICD-10-CM | POA: Diagnosis not present

## 2020-12-14 DIAGNOSIS — E1140 Type 2 diabetes mellitus with diabetic neuropathy, unspecified: Secondary | ICD-10-CM | POA: Diagnosis not present

## 2020-12-21 DIAGNOSIS — G894 Chronic pain syndrome: Secondary | ICD-10-CM | POA: Diagnosis not present

## 2020-12-21 DIAGNOSIS — D631 Anemia in chronic kidney disease: Secondary | ICD-10-CM | POA: Diagnosis not present

## 2020-12-21 DIAGNOSIS — E211 Secondary hyperparathyroidism, not elsewhere classified: Secondary | ICD-10-CM | POA: Diagnosis not present

## 2020-12-21 DIAGNOSIS — E1122 Type 2 diabetes mellitus with diabetic chronic kidney disease: Secondary | ICD-10-CM | POA: Diagnosis not present

## 2020-12-21 DIAGNOSIS — E1140 Type 2 diabetes mellitus with diabetic neuropathy, unspecified: Secondary | ICD-10-CM | POA: Diagnosis not present

## 2020-12-21 DIAGNOSIS — Z4781 Encounter for orthopedic aftercare following surgical amputation: Secondary | ICD-10-CM | POA: Diagnosis not present

## 2020-12-21 DIAGNOSIS — G4733 Obstructive sleep apnea (adult) (pediatric): Secondary | ICD-10-CM | POA: Diagnosis not present

## 2020-12-21 DIAGNOSIS — I2510 Atherosclerotic heart disease of native coronary artery without angina pectoris: Secondary | ICD-10-CM | POA: Diagnosis not present

## 2020-12-21 DIAGNOSIS — R251 Tremor, unspecified: Secondary | ICD-10-CM | POA: Diagnosis not present

## 2020-12-21 DIAGNOSIS — I219 Acute myocardial infarction, unspecified: Secondary | ICD-10-CM | POA: Diagnosis not present

## 2020-12-21 DIAGNOSIS — E1165 Type 2 diabetes mellitus with hyperglycemia: Secondary | ICD-10-CM | POA: Diagnosis not present

## 2020-12-28 DIAGNOSIS — D631 Anemia in chronic kidney disease: Secondary | ICD-10-CM | POA: Diagnosis not present

## 2020-12-28 DIAGNOSIS — R251 Tremor, unspecified: Secondary | ICD-10-CM | POA: Diagnosis not present

## 2020-12-28 DIAGNOSIS — I219 Acute myocardial infarction, unspecified: Secondary | ICD-10-CM | POA: Diagnosis not present

## 2020-12-28 DIAGNOSIS — E1165 Type 2 diabetes mellitus with hyperglycemia: Secondary | ICD-10-CM | POA: Diagnosis not present

## 2020-12-28 DIAGNOSIS — E1122 Type 2 diabetes mellitus with diabetic chronic kidney disease: Secondary | ICD-10-CM | POA: Diagnosis not present

## 2020-12-28 DIAGNOSIS — G894 Chronic pain syndrome: Secondary | ICD-10-CM | POA: Diagnosis not present

## 2020-12-28 DIAGNOSIS — Z4781 Encounter for orthopedic aftercare following surgical amputation: Secondary | ICD-10-CM | POA: Diagnosis not present

## 2020-12-28 DIAGNOSIS — E211 Secondary hyperparathyroidism, not elsewhere classified: Secondary | ICD-10-CM | POA: Diagnosis not present

## 2020-12-28 DIAGNOSIS — E1140 Type 2 diabetes mellitus with diabetic neuropathy, unspecified: Secondary | ICD-10-CM | POA: Diagnosis not present

## 2020-12-28 DIAGNOSIS — I2510 Atherosclerotic heart disease of native coronary artery without angina pectoris: Secondary | ICD-10-CM | POA: Diagnosis not present

## 2020-12-28 DIAGNOSIS — G4733 Obstructive sleep apnea (adult) (pediatric): Secondary | ICD-10-CM | POA: Diagnosis not present

## 2021-01-23 ENCOUNTER — Inpatient Hospital Stay (HOSPITAL_COMMUNITY)
Admission: EM | Admit: 2021-01-23 | Discharge: 2021-02-09 | DRG: 637 | Disposition: A | Discharge status: Skilled Nursing Facility | Payer: Medicaid Other | Attending: Internal Medicine | Admitting: Internal Medicine

## 2021-01-23 ENCOUNTER — Encounter (HOSPITAL_COMMUNITY): Payer: Self-pay | Admitting: *Deleted

## 2021-01-23 ENCOUNTER — Other Ambulatory Visit: Payer: Self-pay

## 2021-01-23 ENCOUNTER — Emergency Department (HOSPITAL_COMMUNITY): Payer: Medicaid Other

## 2021-01-23 DIAGNOSIS — E860 Dehydration: Secondary | ICD-10-CM | POA: Diagnosis present

## 2021-01-23 DIAGNOSIS — N186 End stage renal disease: Secondary | ICD-10-CM | POA: Diagnosis present

## 2021-01-23 DIAGNOSIS — Z111 Encounter for screening for respiratory tuberculosis: Secondary | ICD-10-CM

## 2021-01-23 DIAGNOSIS — Z89511 Acquired absence of right leg below knee: Secondary | ICD-10-CM

## 2021-01-23 DIAGNOSIS — Z886 Allergy status to analgesic agent status: Secondary | ICD-10-CM

## 2021-01-23 DIAGNOSIS — J1282 Pneumonia due to coronavirus disease 2019: Secondary | ICD-10-CM | POA: Diagnosis present

## 2021-01-23 DIAGNOSIS — R55 Syncope and collapse: Secondary | ICD-10-CM

## 2021-01-23 DIAGNOSIS — I2510 Atherosclerotic heart disease of native coronary artery without angina pectoris: Secondary | ICD-10-CM | POA: Diagnosis present

## 2021-01-23 DIAGNOSIS — E1151 Type 2 diabetes mellitus with diabetic peripheral angiopathy without gangrene: Secondary | ICD-10-CM | POA: Diagnosis present

## 2021-01-23 DIAGNOSIS — Z794 Long term (current) use of insulin: Secondary | ICD-10-CM

## 2021-01-23 DIAGNOSIS — E785 Hyperlipidemia, unspecified: Secondary | ICD-10-CM | POA: Diagnosis present

## 2021-01-23 DIAGNOSIS — I5022 Chronic systolic (congestive) heart failure: Secondary | ICD-10-CM | POA: Diagnosis present

## 2021-01-23 DIAGNOSIS — D631 Anemia in chronic kidney disease: Secondary | ICD-10-CM | POA: Diagnosis present

## 2021-01-23 DIAGNOSIS — E1110 Type 2 diabetes mellitus with ketoacidosis without coma: Secondary | ICD-10-CM | POA: Diagnosis not present

## 2021-01-23 DIAGNOSIS — R739 Hyperglycemia, unspecified: Secondary | ICD-10-CM | POA: Diagnosis present

## 2021-01-23 DIAGNOSIS — I132 Hypertensive heart and chronic kidney disease with heart failure and with stage 5 chronic kidney disease, or end stage renal disease: Secondary | ICD-10-CM | POA: Diagnosis present

## 2021-01-23 DIAGNOSIS — Z79899 Other long term (current) drug therapy: Secondary | ICD-10-CM

## 2021-01-23 DIAGNOSIS — I953 Hypotension of hemodialysis: Secondary | ICD-10-CM | POA: Diagnosis not present

## 2021-01-23 DIAGNOSIS — Z5900 Homelessness unspecified: Secondary | ICD-10-CM

## 2021-01-23 DIAGNOSIS — R52 Pain, unspecified: Secondary | ICD-10-CM

## 2021-01-23 DIAGNOSIS — E1122 Type 2 diabetes mellitus with diabetic chronic kidney disease: Secondary | ICD-10-CM | POA: Diagnosis present

## 2021-01-23 DIAGNOSIS — U071 COVID-19: Secondary | ICD-10-CM | POA: Diagnosis present

## 2021-01-23 DIAGNOSIS — R579 Shock, unspecified: Secondary | ICD-10-CM | POA: Diagnosis present

## 2021-01-23 DIAGNOSIS — M898X9 Other specified disorders of bone, unspecified site: Secondary | ICD-10-CM | POA: Diagnosis present

## 2021-01-23 DIAGNOSIS — Z955 Presence of coronary angioplasty implant and graft: Secondary | ICD-10-CM

## 2021-01-23 DIAGNOSIS — I509 Heart failure, unspecified: Secondary | ICD-10-CM

## 2021-01-23 DIAGNOSIS — Z992 Dependence on renal dialysis: Secondary | ICD-10-CM

## 2021-01-23 DIAGNOSIS — F17210 Nicotine dependence, cigarettes, uncomplicated: Secondary | ICD-10-CM | POA: Diagnosis present

## 2021-01-23 DIAGNOSIS — I959 Hypotension, unspecified: Secondary | ICD-10-CM | POA: Diagnosis present

## 2021-01-23 DIAGNOSIS — K219 Gastro-esophageal reflux disease without esophagitis: Secondary | ICD-10-CM | POA: Diagnosis present

## 2021-01-23 DIAGNOSIS — J9601 Acute respiratory failure with hypoxia: Secondary | ICD-10-CM | POA: Diagnosis present

## 2021-01-23 DIAGNOSIS — Z91041 Radiographic dye allergy status: Secondary | ICD-10-CM

## 2021-01-23 DIAGNOSIS — E1310 Other specified diabetes mellitus with ketoacidosis without coma: Secondary | ICD-10-CM

## 2021-01-23 DIAGNOSIS — E11649 Type 2 diabetes mellitus with hypoglycemia without coma: Secondary | ICD-10-CM | POA: Diagnosis not present

## 2021-01-23 DIAGNOSIS — R571 Hypovolemic shock: Secondary | ICD-10-CM | POA: Diagnosis present

## 2021-01-23 DIAGNOSIS — J440 Chronic obstructive pulmonary disease with acute lower respiratory infection: Secondary | ICD-10-CM | POA: Diagnosis present

## 2021-01-23 HISTORY — DX: Disorder of kidney and ureter, unspecified: N28.9

## 2021-01-23 HISTORY — DX: Type 2 diabetes mellitus without complications: E11.9

## 2021-01-23 HISTORY — DX: Essential (primary) hypertension: I10

## 2021-01-23 HISTORY — DX: End stage renal disease: N18.6

## 2021-01-23 LAB — I-STAT VENOUS BLOOD GAS, ED
Acid-base deficit: 2 mmol/L (ref 0.0–2.0)
Bicarbonate: 25.1 mmol/L (ref 20.0–28.0)
Calcium, Ion: 0.98 mmol/L — ABNORMAL LOW (ref 1.15–1.40)
HCT: 47 % (ref 39.0–52.0)
Hemoglobin: 16 g/dL (ref 13.0–17.0)
O2 Saturation: 62 %
Potassium: 4.1 mmol/L (ref 3.5–5.1)
Sodium: 135 mmol/L (ref 135–145)
TCO2: 26 mmol/L (ref 22–32)
pCO2, Ven: 47.8 mmHg (ref 44.0–60.0)
pH, Ven: 7.328 (ref 7.250–7.430)
pO2, Ven: 35 mmHg (ref 32.0–45.0)

## 2021-01-23 LAB — BASIC METABOLIC PANEL
Anion gap: 16 — ABNORMAL HIGH (ref 5–15)
BUN: 21 mg/dL (ref 8–23)
CO2: 21 mmol/L — ABNORMAL LOW (ref 22–32)
Calcium: 8.1 mg/dL — ABNORMAL LOW (ref 8.9–10.3)
Chloride: 96 mmol/L — ABNORMAL LOW (ref 98–111)
Creatinine, Ser: 6.48 mg/dL — ABNORMAL HIGH (ref 0.61–1.24)
GFR, Estimated: 9 mL/min — ABNORMAL LOW (ref >60)
Glucose, Bld: 326 mg/dL — ABNORMAL HIGH (ref 70–99)
Potassium: 4.3 mmol/L (ref 3.5–5.1)
Sodium: 133 mmol/L — ABNORMAL LOW (ref 135–145)

## 2021-01-23 LAB — CBC
HCT: 45.6 % (ref 39.0–52.0)
Hemoglobin: 15.2 g/dL (ref 13.0–17.0)
MCH: 30.2 pg (ref 26.0–34.0)
MCHC: 33.3 g/dL (ref 30.0–36.0)
MCV: 90.7 fL (ref 80.0–100.0)
Platelets: 132 10*3/uL — ABNORMAL LOW (ref 150–400)
RBC: 5.03 MIL/uL (ref 4.22–5.81)
RDW: 13 % (ref 11.5–15.5)
WBC: 8.1 10*3/uL (ref 4.0–10.5)
nRBC: 0 % (ref 0.0–0.2)

## 2021-01-23 LAB — HEPATIC FUNCTION PANEL
ALT: 70 U/L — ABNORMAL HIGH (ref 0–44)
AST: 70 U/L — ABNORMAL HIGH (ref 15–41)
Albumin: 3.1 g/dL — ABNORMAL LOW (ref 3.5–5.0)
Alkaline Phosphatase: 131 U/L — ABNORMAL HIGH (ref 38–126)
Bilirubin, Direct: 0.4 mg/dL — ABNORMAL HIGH (ref 0.0–0.2)
Indirect Bilirubin: 1 mg/dL — ABNORMAL HIGH (ref 0.3–0.9)
Total Bilirubin: 1.4 mg/dL — ABNORMAL HIGH (ref 0.3–1.2)
Total Protein: 6.7 g/dL (ref 6.5–8.1)

## 2021-01-23 LAB — TROPONIN I (HIGH SENSITIVITY): Troponin I (High Sensitivity): 59 ng/L — ABNORMAL HIGH (ref <18)

## 2021-01-23 LAB — CBG MONITORING, ED
Glucose-Capillary: 269 mg/dL — ABNORMAL HIGH (ref 70–99)
Glucose-Capillary: 300 mg/dL — ABNORMAL HIGH (ref 70–99)
Glucose-Capillary: 334 mg/dL — ABNORMAL HIGH (ref 70–99)

## 2021-01-23 LAB — SARS CORONAVIRUS 2 BY RT PCR (HOSPITAL ORDER, PERFORMED IN ~~LOC~~ HOSPITAL LAB): SARS Coronavirus 2: POSITIVE — AB

## 2021-01-23 LAB — MAGNESIUM: Magnesium: 1.8 mg/dL (ref 1.7–2.4)

## 2021-01-23 LAB — I-STAT CHEM 8, ED
BUN: 30 mg/dL — ABNORMAL HIGH (ref 8–23)
Calcium, Ion: 0.97 mmol/L — ABNORMAL LOW (ref 1.15–1.40)
Chloride: 96 mmol/L — ABNORMAL LOW (ref 98–111)
Creatinine, Ser: 5.9 mg/dL — ABNORMAL HIGH (ref 0.61–1.24)
Glucose, Bld: 302 mg/dL — ABNORMAL HIGH (ref 70–99)
HCT: 48 % (ref 39.0–52.0)
Hemoglobin: 16.3 g/dL (ref 13.0–17.0)
Potassium: 4.1 mmol/L (ref 3.5–5.1)
Sodium: 134 mmol/L — ABNORMAL LOW (ref 135–145)
TCO2: 24 mmol/L (ref 22–32)

## 2021-01-23 LAB — LACTIC ACID, PLASMA: Lactic Acid, Venous: 3.2 mmol/L (ref 0.5–1.9)

## 2021-01-23 LAB — AMMONIA: Ammonia: 24 umol/L (ref 9–35)

## 2021-01-23 MED ORDER — SODIUM CHLORIDE 0.9 % IV BOLUS: 500.0000 mL | Freq: Once | INTRAVENOUS | Status: DC

## 2021-01-23 MED ORDER — LACTATED RINGERS IV BOLUS
20.0000 mL/kg | Freq: Once | INTRAVENOUS | Status: AC
  Administered 2021-01-23: 1724 mL via INTRAVENOUS

## 2021-01-23 NOTE — ED Provider Notes (Signed)
[facility] EMERGENCY DEPARTMENT Provider Note   CSN: [id number] Arrival date & time: 01/23/21  1424     History Chief Complaint  Patient presents with  . Weakness  . Shortness of Breath    Jose Robinson is a 65 y.o. male.  The history is provided by the patient and medical records. No language interpreter was used.  Loss of Consciousness Episode history:  Single Most recent episode:  Today Timing:  Constant Progression:  Waxing and waning Chronicity:  New Witnessed: yes   Relieved by:  Nothing Worsened by:  Nothing Ineffective treatments:  None tried Associated symptoms: difficulty breathing, malaise/fatigue and shortness of breath   Associated symptoms: no anxiety, no chest pain, no confusion, no diaphoresis, no dizziness, no fever, no focal weakness, no headaches, no nausea, no palpitations, no recent injury, no vomiting and no weakness        Past Medical History:  Diagnosis Date  . Diabetes mellitus without complication ([place])   . Hypertension   . Renal disorder     There are no problems to display for this patient.   Past Surgical History:  Procedure Laterality Date  . BACK SURGERY    . BELOW KNEE LEG AMPUTATION         No family history on file.  Social History   Tobacco Use  . Smoking status: Current Every Day Smoker  . Smokeless tobacco: Never Used  Substance Use Topics  . Alcohol use: Not Currently  . Drug use: Not Currently    Home Medications Prior to Admission medications   Not on File    Allergies    Ivp dye [iodinated diagnostic agents]  Review of Systems   Review of Systems  Constitutional: Positive for fatigue and malaise/fatigue. Negative for chills, diaphoresis and fever.  Respiratory: Positive for cough and shortness of breath. Negative for chest tightness and wheezing.   Cardiovascular: Positive for syncope. Negative for chest pain, palpitations and leg swelling.  Gastrointestinal: Negative for  abdominal pain, constipation, diarrhea, nausea and vomiting.  Genitourinary:       No urine with ESRD  Musculoskeletal: Negative for back pain, neck pain and neck stiffness.  Skin: Negative for rash and wound.  Neurological: Positive for syncope and light-headedness. Negative for dizziness, focal weakness, weakness, numbness and headaches.  Psychiatric/Behavioral: Negative for agitation and confusion.  All other systems reviewed and are negative.   Physical Exam Updated Vital Signs BP (!) 80/49 (BP Location: Left Wrist)   Pulse 60   Temp (!) 97.3 F (36.3 C) (Oral)   Resp 16   SpO2 94% Comment: Pt placed on 02/1L per his request  Physical Exam Vitals and nursing note reviewed.  Constitutional:      General: He is not in acute distress.    Appearance: He is well-developed and well-nourished. He is ill-appearing. He is not toxic-appearing or diaphoretic.  HENT:     Head: Normocephalic and atraumatic.  Eyes:     Extraocular Movements: Extraocular movements intact.     Conjunctiva/sclera: Conjunctivae normal.     Pupils: Pupils are equal, round, and reactive to light.  Cardiovascular:     Rate and Rhythm: Regular rhythm. Tachycardia present.  No extrasystoles are present.    Heart sounds: No murmur heard.   Pulmonary:     Effort: Pulmonary effort is normal. Tachypnea present. No respiratory distress.     Breath sounds: Rhonchi present. No wheezing or rales.  Chest:     Chest wall: No  tenderness.  Abdominal:     Palpations: Abdomen is soft.     Tenderness: There is no abdominal tenderness.  Musculoskeletal:     Cervical back: Neck supple.     Left lower leg: No tenderness. Edema present.     Comments: r BKA  Skin:    General: Skin is warm and dry.     Capillary Refill: Capillary refill takes less than 2 seconds.     Findings: No erythema.  Neurological:     General: No focal deficit present.     Mental Status: He is alert.  Psychiatric:        Mood and Affect: Mood  and affect and mood normal.     ED Results / Procedures / Treatments   Labs (all labs ordered are listed, but only abnormal results are displayed) Labs Reviewed  SARS CORONAVIRUS 2 BY RT PCR ([place], [place] [facility]) - Abnormal; Notable for the following components:      Result Value   SARS Coronavirus 2 POSITIVE (*)    All other components within normal limits  BASIC METABOLIC PANEL - Abnormal; Notable for the following components:   Sodium 133 (*)    Chloride 96 (*)    CO2 21 (*)    Glucose, Bld 326 (*)    Creatinine, Ser 6.48 (*)    Calcium 8.1 (*)    GFR, Estimated 9 (*)    Anion gap 16 (*)    All other components within normal limits  CBC - Abnormal; Notable for the following components:   Platelets 132 (*)    All other components within normal limits  HEPATIC FUNCTION PANEL - Abnormal; Notable for the following components:   Albumin 3.1 (*)    AST 70 (*)    ALT 70 (*)    Alkaline Phosphatase 131 (*)    Total Bilirubin 1.4 (*)    Bilirubin, Direct 0.4 (*)    Indirect Bilirubin 1.0 (*)    All other components within normal limits  LACTIC ACID, PLASMA - Abnormal; Notable for the following components:   Lactic Acid, Venous 3.2 (*)    All other components within normal limits  CBG MONITORING, ED - Abnormal; Notable for the following components:   Glucose-Capillary 300 (*)    All other components within normal limits  CBG MONITORING, ED - Abnormal; Notable for the following components:   Glucose-Capillary 334 (*)    All other components within normal limits  I-STAT CHEM 8, ED - Abnormal; Notable for the following components:   Sodium 134 (*)    Chloride 96 (*)    BUN 30 (*)    Creatinine, Ser 5.90 (*)    Glucose, Bld 302 (*)    Calcium, Ion 0.97 (*)    All other components within normal limits  I-STAT VENOUS BLOOD GAS, ED - Abnormal; Notable for the following components:   Calcium, Ion 0.98 (*)    All other components within normal  limits  TROPONIN I (HIGH SENSITIVITY) - Abnormal; Notable for the following components:   Troponin I (High Sensitivity) 59 (*)    All other components within normal limits  URINE CULTURE  MAGNESIUM  AMMONIA  BLOOD GAS, ARTERIAL  LACTIC ACID, PLASMA  BLOOD GAS, VENOUS  URINALYSIS, ROUTINE W REFLEX MICROSCOPIC  BASIC METABOLIC PANEL  BASIC METABOLIC PANEL  BASIC METABOLIC PANEL  BASIC METABOLIC PANEL  BETA-HYDROXYBUTYRIC ACID  BETA-HYDROXYBUTYRIC ACID  CBG MONITORING, ED  TROPONIN I (HIGH SENSITIVITY)  EKG EKG Interpretation  Date/Time:  Tuesday January 23 2021 18:05:36 EST Ventricular Rate:  109 PR Interval:    QRS Duration: 116 QT Interval:  349 QTC Calculation: 470 R Axis:   -63 Text Interpretation: Sinus tachycardia Probable left atrial enlargement Left anterior fascicular block LVH with secondary repolarization abnormality Probable anterior infarct, age indeterminate No prior ECG for comparison. No STEMI Confirmed by Antony Blackbird [phone]) on 01/23/2021 6:12:49 PM   Radiology DG Chest 2 View  Result Date: 01/23/2021 CLINICAL DATA:  Weakness, short of breath, diabetes, tobacco abuse EXAM: CHEST - 2 VIEW COMPARISON:  None. FINDINGS: Frontal and lateral views of the chest demonstrate unremarkable cardiac silhouette. Left internal jugular dialysis catheter tip at the atriocaval junction. Stents are seen within the bilateral axillary regions. Diffuse interstitial and ground-glass opacities are identified. Findings are greatest at the lung bases. No effusion or pneumothorax. No acute bony abnormalities. IMPRESSION: 1. Diffuse interstitial and ground-glass opacities, favor edema over infection. Electronically Signed   By: Randa Ngo M.D.   On: 01/23/2021 15:08    Procedures Procedures   CRITICAL CARE Performed by: Gwenyth Allegra Gilda Abboud Total critical care time: 45 minutes Critical care time was exclusive of separately billable procedures and treating other  patients. Critical care was necessary to treat or prevent imminent or life-threatening deterioration. Critical care was time spent personally by me on the following activities: development of treatment plan with patient and/or surrogate as well as nursing, discussions with consultants, evaluation of patient's response to treatment, examination of patient, obtaining history from patient or surrogate, ordering and performing treatments and interventions, ordering and review of laboratory studies, ordering and review of radiographic studies, pulse oximetry and re-evaluation of patient's condition.   Medications Ordered in ED Medications  lactated ringers bolus 1,724 mL (1,724 mLs Intravenous New Bag/Given 01/23/21 2239)    ED Course  I have reviewed the triage vital signs and the nursing notes.  Pertinent labs & imaging results that were available during my care of the patient were reviewed by me and considered in my medical decision making (see chart for details).    MDM Rules/Calculators/A&P                          Jose Robinson is a 65 y.o. male with a past medical history significant for hypertension, diabetes, and ESRD on dialysis TTS who presents with fatigue, weakness, shortness of breath, cough, and syncopal episode in the waiting room.  According to patient, he was brought in by EMS and was found to have some hyperglycemia with glucose around 500.  He has been weak and fatigued today and thinks he may have had too much fluid taken off at dialysis this morning.  He says he has not missed any recent treatments.  He denies any fevers or chills but does have a cough and shortness of breath.  He reported some chest tightness that has improved.  He denies any abdominal pain, nausea, or vomiting.  He reports he does not make any urine.    Patient was waiting in the waiting room for approximately 3 hours after being brought in by EMS due to the current weights.  Patient reportedly had a syncopal  episode and was unresponsive and they cannot detect a pulse oximetry reading.  Patient was brought back to the resuscitation room and after painful stimulation and yelling at him, he was able to respond.  Patient says that he passed out and is  feeling fatigued and tired.  Is also complaining of shortness of breath.  He was placed on 3 L nasal cannula and his oxygen saturation is in the mid 90s now.  He does not take oxygen at home.  He does take dialysis and he reports he has done so for the last 25 years and reports he has not missed any treatments.  He does not feel he is fluid overloaded.  The documentation EMS thought that he had too much fluid taken off initially.   He denies any current chest pain or palpitations.  Denies nausea or vomiting.  Denies any constipation or diarrhea.  Reports he does not make urine.  Reports his leg does not feel more swollen and he has a right below the knee amputation.  Patient still is somnolent when not being converse with.  On exam, patient does have some rhonchi in his lungs.  Chest abdomen nontender.  Right leg amputation and left leg has some mild edema which he reports is at baseline.  He is somnolent but is moving all extremities.  Patient had chest x-ray in triage showing some opacities favoring edema.  He also had a glucose was in the 300s.  During his evaluation, his blood pressure was found to be in the 70s initially.  We will try to get some access on him to give him some fluids.  As of now, his only access is his left chest dialysis catheter and both arms restricted due to previous grafts.  I will ask nephrology if it is possible to use his dialysis catheters that have tape over them for fluid administration if he needs it  We will get the rest of screening labs, will get evaluation and check for Covid.  We will look for DKA with labs, and will also look for hypercarbia given his somnolence and mental status abnormalities.  Due to his new oxygen  requirement, the syncopal episode and hypotension here, I anticipate patient will likely need admission when work-up is completed.  6:03 PM Just spoke to nephrology. They recommended we hold off on IV access unless absolutely necessary. They agreed with getting work-up to rule out DKA and other electrolyte abnormalities. If needed, they would recommend an EJ IV versus a right IJ triple-lumen catheter if absolutely needed.   7:21 PM Pressure has reportedly dropped again now down to the 70s from 150s.  At this point, I do feel that some access necessary so we will try placing an EJ in his left neck.  This will preserve the right IJ if needed for central access.  7:44 PM  Just attempted a ultrasound-guided left EJ IV however, patient had such scar tissue, it was unsuccessful.  In order to continue and try to preserve the right neck for possible triple-lumen IJ, we will try one more time to get blood from an arterial stick and also allow [place] to get an ABG on him.  Patient's pressure also improved to around [id number] systolic so we will hold on triple-lumen placement as he is no longer hypotensive.  10:07 PM  Labs returned and does show patient is likely in early DKA with an anion gap of 16 and hyperglycemia in the 300s.  Patient's pH is at 7.328.  Patient is still somnolent and has some fluctuating blood pressures.  I spoke with nephrology again who feels he does need DKA treatment with glucose stabilizer and admission.  They recommended admission and they will follow for dialysis.  We will see what  access he has to get the glucose stabilizer but if patient becomes hypotensive again, he may need a central line.  Will discuss with critical care about location for best placement as he already has a left chest dialysis catheter that goes into the SVC  10:17 PM Critical care felt that groin access was likely more ideal than going through the neck and getting near the tunnel catheter.  Nursing reports that they are  able to do the insulin through the 22 access he has  I will discuss with admitting team and he will be admitted.  11:09 PM Patient is Covid positive.  He will be admitted for further management of the DKA, syncope, hypotension, hypoxia, and Covid.   Final Clinical Impression(s) / ED Diagnoses Final diagnoses:  Syncope, unspecified syncope type  Diabetic ketoacidosis without coma associated with other specified diabetes mellitus ([place])  COVID     Clinical Impression: 1. Syncope, unspecified syncope type   2. Diabetic ketoacidosis without coma associated with other specified diabetes mellitus ([place])   3. COVID     Disposition: Admit  This note was prepared with assistance of Dragon voice recognition software. Occasional wrong-word or sound-a-like substitutions may have occurred due to the inherent limitations of voice recognition software.     Clayton Jarmon, Gwenyth Allegra, MD 01/23/21 [phone]

## 2021-01-23 NOTE — H&P (Addendum)
History and Physical    Jose Robinson UXL:[id number] DOB: [birth year] DOA: 01/23/2021  PCP: Pcp, No Patient coming from: Home  Chief Complaint: Generalized weakness, shortness of breath  HPI: Jose Robinson is a 64 y.o. male with medical history significant of ESRD on HD TTS, CAD, PAD, chronic systolic heart failure, COPD, insulin-dependent diabetes, history of right BKA presenting to the ED via EMS with complaints of generalized weakness and shortness of breath. EMS reported blood glucose of 493.  Patient states he was in his usual state of health until he went for dialysis today and after the session started feeling bad.  He felt very weak and called EMS.  States he has been going for dialysis for the past 25 years and previously never had issues.  He thinks they removed too much fluid today.  States he is fully vaccinated against Covid including booster shot.  Prior to dialysis today he was feeling well and has not had any fevers, chills, body aches, cough, shortness of breath, chest pain, nausea, vomiting, abdominal pain, or diarrhea.  Patient states he does not use insulin or any other medications for his diabetes.  He is requesting food to eat.  ED Course: While in the ED lobby, patient had a syncopal episode. Staff were not able to detect a pulse oximetry reading. He became responsive in the resuscitation room after being spoken to with a loud voice and with painful stimulation. He was placed on 3 L supplemental oxygen. He was hypotensive with systolic in the 53G and was given 1.724 L LR bolus. Blood pressure subsequently improved.  Labs showing WBC 8.1, hemoglobin 15.2, platelet count 132K. Sodium 133, potassium 4.3, chloride 96, bicarb 21, anion gap 16, BUN 21, creatinine 6.4, glucose 326. LFTs slightly elevated (AST 70, ALT 70, alk phos 131, T bili 1.4). Magnesium 1.8. High-sensitivity troponin 59. EKG showing slight ST depressions and T wave inversions in inferior and lateral leads, no prior  tracing for comparison. Lactic acid 3.2. SARS-CoV-2 PCR test positive. Ammonia level normal. VBG with pH 7.32.  Chest x-ray showing diffuse interstitial and groundglass opacities, greatest at the lung bases.  ED provider discussed the case with Dr. Justin Mend, nephrology will consult.  Review of Systems:  All systems reviewed and apart from history of presenting illness, are negative.  Past Medical History:  Diagnosis Date  . Diabetes mellitus without complication ([place])   . Hypertension   . Renal disorder     Past Surgical History:  Procedure Laterality Date  . BACK SURGERY    . BELOW KNEE LEG AMPUTATION       reports that he has been smoking. He has never used smokeless tobacco. He reports previous alcohol use. He reports previous drug use.  Allergies  Allergen Reactions  . Acetaminophen Palpitations  . Prednisone Other (See Comments)    Severe hallucinations/paranoid delusions after steroid premedications for contrast allergy, required IV haldol and restraints. Severe hallucinations/paranoid delusions after steroid premedications for contrast allergy, required IV haldol and restraints.   [name] [Iodinated Diagnostic Agents]     History reviewed. No pertinent family history.  Prior to Admission medications   Medication Sig Start Date End Date Taking? Authorizing Provider  calcium acetate (PHOSLO) 667 MG capsule Take 2,001 mg by mouth in the morning, at noon, and at bedtime. 02/24/14  Yes [provider]  donepezil (ARICEPT) 10 MG tablet Take 10 mg by mouth daily. 12/30/20  Yes [provider]  gabapentin (NEURONTIN) 100 MG capsule  Take 100 mg by mouth 3 (three) times daily. 12/30/20  Yes [provider]  glucagon 1 MG injection Inject 1 mg into the muscle daily as needed (low blood sugar). 06/05/18  Yes [provider]  midodrine (PROAMATINE) 5 MG tablet Take 5 mg by mouth every Tuesday, Thursday, and Saturday at 6 PM. 04/04/20  Yes [provider]  multivitamin (RENA-VIT) TABS tablet Take 1 tablet by mouth daily. 01/02/21  Yes [provider]  nystatin ointment (MYCOSTATIN) Apply 1 application topically in the morning and at bedtime. 02/21/20 02/20/21 Yes [provider]  omeprazole (PRILOSEC) 20 MG capsule Take 20 mg by mouth in the morning and at bedtime.   Yes [provider]  ondansetron (ZOFRAN) 4 MG tablet Take 4 mg by mouth every 8 (eight) hours as needed for nausea or vomiting. 02/02/18  Yes [provider]  rosuvastatin (CRESTOR) 10 MG tablet Take 10 mg by mouth at bedtime. 12/30/20  Yes [provider]  NOVOLOG FLEXPEN 100 UNIT/ML FlexPen Inject into the skin. 12/30/20   [provider]  Pancrelipase, Lip-Prot-Amyl, 5000-24000 units CPEP Take 1 capsule by mouth in the morning, at noon, and at bedtime.    [provider]  sevelamer carbonate (RENVELA) 800 MG tablet Take 800 mg by mouth 3 (three) times daily. 12/30/20   [provider]    Physical Exam: Vitals:   01/23/21 1735 01/23/21 1755 01/23/21 1800 01/23/21 2200  BP: (!) 161/119 (!) 131/114 110/82   Pulse:  (!) 114    Resp: (!) 37 (!) 34 (!) 32   Temp:  98.5 F (36.9 C)    TempSrc:  Oral    SpO2:  98% 97%   Weight:    86.2 kg    Physical Exam Constitutional:      General: He is not in acute distress. HENT:     Head: Normocephalic and atraumatic.     Mouth/Throat:     Mouth: Mucous membranes are dry.  Eyes:     Extraocular Movements: Extraocular movements intact.  Cardiovascular:     Rate and Rhythm: Normal rate and regular rhythm.     Pulses: Normal pulses.  Pulmonary:     Effort: Pulmonary effort is normal. No respiratory distress.     Breath sounds: Rales present. No wheezing.  Abdominal:     General: Bowel sounds are normal. There is no distension.     Palpations: Abdomen is soft.     Tenderness: There is no abdominal tenderness.  Musculoskeletal:     Cervical back: Normal  range of motion and neck supple.     Comments: Right BKA +1 pitting edema of left lower extremity  Skin:    General: Skin is warm and dry.  Neurological:     General: No focal deficit present.     Mental Status: He is alert and oriented to person, place, and time.     Sensory: No sensory deficit.     Motor: No weakness.     Labs on Admission: I have personally reviewed following labs and imaging studies  CBC: Recent Labs  Lab 01/23/21 1438 01/23/21 2058  WBC 8.1  --   HGB 15.2 16.0  16.3  HCT 45.6 47.0  48.0  MCV 90.7  --   PLT 132*  --    Basic Metabolic Panel: Recent Labs  Lab 01/23/21 1438 01/23/21 1733 01/23/21 2058  NA 133*  --  135  134*  K 4.3  --  4.1  4.1  CL 96*  --  96*  CO2 21*  --   --   GLUCOSE 326*  --  302*  BUN 21  --  30*  CREATININE 6.48*  --  5.90*  CALCIUM 8.1*  --   --   MG  --  1.8  --    GFR: CrCl cannot be calculated (Unknown ideal weight.). Liver Function Tests: Recent Labs  Lab 01/23/21 1733  AST 70*  ALT 70*  ALKPHOS 131*  BILITOT 1.4*  PROT 6.7  ALBUMIN 3.1*   No results for input(s): LIPASE, AMYLASE in the last 168 hours. Recent Labs  Lab 01/23/21 1805  AMMONIA 24   Coagulation Profile: No results for input(s): INR, PROTIME in the last 168 hours. Cardiac Enzymes: No results for input(s): CKTOTAL, CKMB, CKMBINDEX, TROPONINI in the last 168 hours. BNP (last 3 results) No results for input(s): PROBNP in the last 8760 hours. HbA1C: No results for input(s): HGBA1C in the last 72 hours. CBG: Recent Labs  Lab 01/23/21 1435 01/23/21 1728 01/23/21 2355  GLUCAP 300* 334* 269*   Lipid Profile: No results for input(s): CHOL, HDL, LDLCALC, TRIG, CHOLHDL, LDLDIRECT in the last 72 hours. Thyroid Function Tests: No results for input(s): TSH, T4TOTAL, FREET4, T3FREE, THYROIDAB in the last 72 hours. Anemia Panel: No results for input(s): VITAMINB12, FOLATE, FERRITIN, TIBC, IRON, RETICCTPCT in the last 72 hours. Urine  analysis: No results found for: COLORURINE, APPEARANCEUR, LABSPEC, [place], GLUCOSEU, HGBUR, BILIRUBINUR, KETONESUR, PROTEINUR, UROBILINOGEN, NITRITE, LEUKOCYTESUR  Radiological Exams on Admission: DG Chest 2 View  Result Date: 01/23/2021 CLINICAL DATA:  Weakness, short of breath, diabetes, tobacco abuse EXAM: CHEST - 2 VIEW COMPARISON:  None. FINDINGS: Frontal and lateral views of the chest demonstrate unremarkable cardiac silhouette. Left internal jugular dialysis catheter tip at the atriocaval junction. Stents are seen within the bilateral axillary regions. Diffuse interstitial and ground-glass opacities are identified. Findings are greatest at the lung bases. No effusion or pneumothorax. No acute bony abnormalities. IMPRESSION: 1. Diffuse interstitial and ground-glass opacities, favor edema over infection. Electronically Signed   By: Randa Ngo M.D.   On: 01/23/2021 15:08    EKG: Independently reviewed. Sinus tachycardia, slight ST depressions and T wave inversions in inferior and lateral leads. No prior tracing for comparison.  Assessment/Plan Principal Problem:   DKA (diabetic ketoacidosis) ([place]) Active Problems:   COVID-19 virus infection   Syncope   ESRD (end stage renal disease) ([facility])   Congestive heart failure (CHF) ([facility])   Hyperglycemia, possible early DKA Insulin-dependent diabetes mellitus Suspect due to noncompliance.  Patient is not taking insulin at home.  Blood glucose 326.  Bicarb 21, anion gap 16.  VBG with pH 7.32. -Keep n.p.o.  Insulin infusion per DKA protocol.  Patient has already received fluid boluses in the ED, will avoid additional fluid unless CBG is less than 250, then give gentle hydration with D5-LR until resolution of DKA.  Continue frequent CBG checks per Endo tool.  Monitor BMP every 4 hours.  Check beta hydroxybutyric acid level.  Check A1c.  After DKA resolves, initiate diet and subcutaneous insulin.  Continue IV insulin for an additional 1 to 2 hours to  ensure adequate plasma insulin levels.  Consult diabetes coordinator.  COVID-19 viral infection: SARS-CoV-2 PCR test positive.  However, patient is fully vaccinated including booster shot.  He denies any symptoms of an acute viral illness.  Denies cough or shortness of breath.  Chest x-ray showing diffuse interstitial and groundglass opacities, greatest  at the lung bases. ?Pneumonia versus pulmonary edema.  Did go for dialysis today and reports compliance.  Not hypoxic, satting 97-98% on room air.  Bacterial pneumonia less likely given no leukocytosis. -Start remdesivir.  Will hold steroids given no hypoxia.  Give vitamin C, zinc, vitamin D.  Check inflammatory markers to assess severity of Covid infection.  Check procalcitonin level.  Blood culture x2 ordered.  Continue airborne and contact precautions.  Syncope: Suspect due to volume changes related to dialysis.  Patient was hypotensive at the time with systolic in the 63Z, blood pressure now improved after fluid resuscitation.  Does have lactic acidosis which could be due to dehydration.  Does have EKG changes and slightly elevated troponin but not endorsing chest pain.  Neuro exam nonfocal. -Cardiac monitoring, continue to monitor blood pressure, echocardiogram ordered  Lactic acidosis: Lactic acid 3.2.  No fever or leukocytosis.  He is not endorsing any symptoms to suggest underlying infection. -Patient was given fluid boluses in the ED, repeat lactic acid level.  Checking procalcitonin level.  Elevated troponin in the setting of CAD: High-sensitivity troponin 59.  EKG showing slight ST depressions and T wave inversions in inferior and lateral leads, no prior tracing available for comparison.  Patient is not endorsing chest pain.  Does have documented history of CAD but not able to find any prior cardiology notes in the chart.  Unclear whether this is due to NSTEMI versus demand ischemia from pneumonia/possible pulmonary edema. -Cardiac monitoring.   Give aspirin 324 mg and start unfractionated heparin.  Echocardiogram has been ordered.  Continue to trend troponin.  Please consult cardiology in the morning.  Addendum: Unable to do heparin at this time as patient only has one IV access and needs insulin.  ED physician was not able to place EJ line.  Cannot start heparin at this time.  I have advised RN to draw repeat troponin, if significantly elevated and there is concern for ACS, will speak to critical care to request central line which is likely going to be through femoral access.  Slightly elevated LFTs: Likely due to COVID-19 viral infection. AST 70, ALT 70, alk phos 131, T bili 1.4.  No complaints of nausea, vomiting, or abdominal pain.  Abdominal exam benign. -Continue to monitor LFTs  ESRD on HD TTS: Chest x-ray with question of pneumonia versus pulmonary edema.  Patient did go for dialysis today and reports compliance.  Not hypoxic.  Potassium 4.3, bicarb 21. -Nephrology consulted by ED provider.  Chronic systolic heart failure -Volume management with dialysis  COPD: Stable.  No wheezing. -Combivent inhaler as needed.    Addendum 2:40 AM: Patient now hypotensive with systolic as low as 85Y and tachypneic with respiratory rate in the mid 30s.  Already received IV fluid boluses earlier in the ED and had concern for pulmonary edema on chest x-ray.  Will need vasopressors for hypotension and central line placement.  Discussed with Dr. Patsey Berthold, critical care will see the patient.   Pharmacy med rec pending.  DVT prophylaxis: Heparin Code Status: Full code Family Communication: No family available this time. Disposition Plan: Status is: Observation  The patient remains OBS appropriate and will d/c before 2 midnights.  Dispo: The patient is from: Home              Anticipated d/c is to: Home              Anticipated d/c date is: 2 days  Patient currently is not medically stable to d/c.   Difficult to place patient  No  Level of care: Level of care: Progressive The medical decision making on this patient was of high complexity and the patient is at high risk for clinical deterioration, therefore this is a level 3 visit.  Shela Leff MD [facility]  If 7PM-7AM, please contact night-coverage [email or web address]  01/24/2021, 1:20 AM

## 2021-01-23 NOTE — ED Notes (Signed)
Pt found in lobby slumped over and slow to respond to questions. Vitals taken and RN notified

## 2021-01-23 NOTE — ED Notes (Signed)
[place], daughter, [phone] would like an update when available

## 2021-01-23 NOTE — H&P (Incomplete)
History and Physical    Jose Robinson LYY:[id number] DOB: 01-08-1956 DOA: 01/23/2021  PCP: Pcp, No Patient coming from: Home  Chief Complaint: Generalized weakness, shortness of breath  HPI: Jose Robinson is a 65 y.o. male with medical history significant of ESRD on HD TTS, CAD, PAD, chronic systolic heart failure, COPD, insulin-dependent diabetes presenting to the ED via EMS with complaints of generalized weakness and shortness of breath. EMS reported blood glucose of 493. Patient went for dialysis this morning.  ED Course: While in the ED lobby, patient had a syncopal episode. Staff were not able to detect a pulse oximetry reading. He became responsive in the resuscitation room after being spoken to with a loud voice and with painful stimulation. He was placed on 3 L supplemental oxygen. He was hypotensive with systolic in the 12X and was given 1.724 L LR bolus. Blood pressure subsequently improved.  Labs showing WBC 8.1, hemoglobin 15.2, platelet count 132K. Sodium 133, potassium 4.3, chloride 96, bicarb 21, anion gap 16, BUN 21, creatinine 6.4, glucose 326. LFTs slightly elevated (AST 70, ALT 70, alk phos 131, T bili 1.4). Magnesium 1.8. High-sensitivity troponin 59. EKG showing ST changes in inferior and lateral leads, no prior tracing for comparison. Lactic acid 3.2. SARS-CoV-2 PCR test positive. Ammonia level normal. VBG with pH 7.32.  Chest x-ray showing diffuse interstitial and groundglass opacities, greatest at the lung bases.  ED provider discussed the case with Dr. Justin Mend, nephrology will consult.  Review of Systems:  All systems reviewed and apart from history of presenting illness, are negative.  Past Medical History:  Diagnosis Date  . Diabetes mellitus without complication ([place])   . Hypertension   . Renal disorder     Past Surgical History:  Procedure Laterality Date  . BACK SURGERY    . BELOW KNEE LEG AMPUTATION       reports that he has been smoking. He has never  used smokeless tobacco. He reports previous alcohol use. He reports previous drug use.  Allergies  Allergen Reactions  . Acetaminophen Palpitations  . Prednisone Other (See Comments)    Severe hallucinations/paranoid delusions after steroid premedications for contrast allergy, required IV haldol and restraints. Severe hallucinations/paranoid delusions after steroid premedications for contrast allergy, required IV haldol and restraints.   [name] [Iodinated Diagnostic Agents]     No family history on file.  Prior to Admission medications   Medication Sig Start Date End Date Taking? Authorizing Provider  calcium acetate (PHOSLO) 667 MG capsule Take 2,001 mg by mouth in the morning, at noon, and at bedtime. 02/24/14  Yes [provider]  donepezil (ARICEPT) 10 MG tablet Take 10 mg by mouth daily. 12/30/20  Yes [provider]  gabapentin (NEURONTIN) 100 MG capsule Take 100 mg by mouth 3 (three) times daily. 12/30/20  Yes [provider]  glucagon 1 MG injection Inject 1 mg into the muscle daily as needed (low blood sugar). 06/05/18  Yes [provider]  midodrine (PROAMATINE) 5 MG tablet Take 5 mg by mouth every Tuesday, Thursday, and Saturday at 6 PM. 04/04/20  Yes [provider]  multivitamin (RENA-VIT) TABS tablet Take 1 tablet by mouth daily. 01/02/21  Yes [provider]  nystatin ointment (MYCOSTATIN) Apply 1 application topically in the morning and at bedtime. 02/21/20 02/20/21 Yes [provider]  omeprazole (PRILOSEC) 20 MG capsule Take 20 mg by mouth in the morning and at bedtime.   Yes [provider]  ondansetron [facility])  4 MG tablet Take 4 mg by mouth every 8 (eight) hours as needed for nausea or vomiting. 02/02/18  Yes [provider]  rosuvastatin (CRESTOR) 10 MG tablet Take 10 mg by mouth at bedtime. 12/30/20  Yes [provider]  NOVOLOG FLEXPEN 100 UNIT/ML FlexPen Inject into the skin. 12/30/20    [provider]  Pancrelipase, Lip-Prot-Amyl, 5000-24000 units CPEP Take 1 capsule by mouth in the morning, at noon, and at bedtime.    [provider]  sevelamer carbonate (RENVELA) 800 MG tablet Take 800 mg by mouth 3 (three) times daily. 12/30/20   [provider]    Physical Exam: Vitals:   01/23/21 1735 01/23/21 1755 01/23/21 1800 01/23/21 2200  BP: (!) 161/119 (!) 131/114 110/82   Pulse:  (!) 114    Resp: (!) 37 (!) 34 (!) 32   Temp:  98.5 F (36.9 C)    TempSrc:  Oral    SpO2:  98% 97%   Weight:    86.2 kg    ***  Labs on Admission: I have personally reviewed following labs and imaging studies  CBC: Recent Labs  Lab 01/23/21 1438 01/23/21 2058  WBC 8.1  --   HGB 15.2 16.0  16.3  HCT 45.6 47.0  48.0  MCV 90.7  --   PLT 132*  --    Basic Metabolic Panel: Recent Labs  Lab 01/23/21 1438 01/23/21 1733 01/23/21 2058  NA 133*  --  135  134*  K 4.3  --  4.1  4.1  CL 96*  --  96*  CO2 21*  --   --   GLUCOSE 326*  --  302*  BUN 21  --  30*  CREATININE 6.48*  --  5.90*  CALCIUM 8.1*  --   --   MG  --  1.8  --    GFR: CrCl cannot be calculated (Unknown ideal weight.). Liver Function Tests: Recent Labs  Lab 01/23/21 1733  AST 70*  ALT 70*  ALKPHOS 131*  BILITOT 1.4*  PROT 6.7  ALBUMIN 3.1*   No results for input(s): LIPASE, AMYLASE in the last 168 hours. Recent Labs  Lab 01/23/21 1805  AMMONIA 24   Coagulation Profile: No results for input(s): INR, PROTIME in the last 168 hours. Cardiac Enzymes: No results for input(s): CKTOTAL, CKMB, CKMBINDEX, TROPONINI in the last 168 hours. BNP (last 3 results) No results for input(s): PROBNP in the last 8760 hours. HbA1C: No results for input(s): HGBA1C in the last 72 hours. CBG: Recent Labs  Lab 01/23/21 1435 01/23/21 1728  GLUCAP 300* 334*   Lipid Profile: No results for input(s): CHOL, HDL, LDLCALC, TRIG, CHOLHDL, LDLDIRECT in the last 72 hours. Thyroid Function  Tests: No results for input(s): TSH, T4TOTAL, FREET4, T3FREE, THYROIDAB in the last 72 hours. Anemia Panel: No results for input(s): VITAMINB12, FOLATE, FERRITIN, TIBC, IRON, RETICCTPCT in the last 72 hours. Urine analysis: No results found for: COLORURINE, APPEARANCEUR, LABSPEC, [place], GLUCOSEU, HGBUR, BILIRUBINUR, KETONESUR, PROTEINUR, UROBILINOGEN, NITRITE, LEUKOCYTESUR  Radiological Exams on Admission: DG Chest 2 View  Result Date: 01/23/2021 CLINICAL DATA:  Weakness, short of breath, diabetes, tobacco abuse EXAM: CHEST - 2 VIEW COMPARISON:  None. FINDINGS: Frontal and lateral views of the chest demonstrate unremarkable cardiac silhouette. Left internal jugular dialysis catheter tip at the atriocaval junction. Stents are seen within the bilateral axillary regions. Diffuse interstitial and ground-glass opacities are identified. Findings are greatest at the lung bases. No effusion or pneumothorax. No acute  bony abnormalities. IMPRESSION: 1. Diffuse interstitial and ground-glass opacities, favor edema over infection. Electronically Signed   By: Randa Ngo M.D.   On: 01/23/2021 15:08    EKG: Independently reviewed. Sinus tachycardia, ST changes in inferior and lateral leads. No prior tracing for comparison.  Assessment/Plan Active Problems:   Hyperglycemia       ESRD on HD, CAD, PAD, chronic systolic heart failure, COPD, insulin-dependent diabetes presenting to the ED via EMS with complaints of generalized weakness and shortness of breath. EMS reported blood glucose of 493. Patient went for dialysis this morning.  ED Course: While in the ED lobby, patient had a syncopal episode. Staff were not able to detect a pulse oximetry reading. He became responsive in the resuscitation room after being spoken to with a loud voice and with painful stimulation. He was placed on 3 L supplemental oxygen. He was hypotensive with systolic in the 89Q and was given 1.724 L LR bolus. Blood pressure  subsequently improved.  Labs showing WBC 8.1, hemoglobin 15.2, platelet count 132K. Sodium 133, potassium 4.3, chloride 96, bicarb 21, anion gap 16, BUN 21, creatinine 6.4, glucose 326. LFTs slightly elevated (AST 70, ALT 70, alk phos 131, T bili 1.4). Magnesium 1.8. High-sensitivity troponin 59. EKG showing ST changes in inferior and lateral leads, no prior tracing for comparison. Lactic acid 3.2. SARS-CoV-2 PCR test positive. Ammonia level normal. VBG with pH 7.32.  Chest x-ray showing diffuse interstitial and groundglass opacities, greatest at the lung bases.  ED provider discussed the case with Dr. Justin Mend, nephrology will consult.  Syncope work-up***  *** HIV screening*** The patient falls between the ages of 13-64 and should be screened for HIV, therefore HIV testing ordered.  Pharmacy med rec pending.  DVT prophylaxis: Subcutaneous heparin Code Status: *** Family Communication: ***  Disposition Plan: Status is: Observation  {Observation:23811}  Dispo: The patient is from: {From:23814}              Anticipated d/c is to: {To:23815}              Anticipated d/c date is: {Days:23816}              Patient currently {Medically stable:23817}   Difficult to place patient {Yes/No:25151}       Consults called: *** Admission status: *** Level of care: Level of care: Progressive The medical decision making on this patient was of high complexity and the patient is at high risk for clinical deterioration, therefore this is a level 3 visit.***  The medical decision making is of moderate complexity, therefore this is a level 2 visit.***  Shela Leff MD [facility]  If 7PM-7AM, please contact night-coverage [email or web address]  01/23/2021, 11:30 PM

## 2021-01-23 NOTE — ED Triage Notes (Signed)
Pt arrived via ems with complaints of weakness and sob.  EMS reports blood sugar 493.  Pt had HD this am and patient thinks they took off too much.  Pt is alert and has right LE prothesis. Pt complains of pain all over. No fever.

## 2021-01-24 ENCOUNTER — Inpatient Hospital Stay (HOSPITAL_COMMUNITY): Payer: Medicaid Other

## 2021-01-24 ENCOUNTER — Encounter (HOSPITAL_COMMUNITY): Payer: Self-pay | Admitting: Internal Medicine

## 2021-01-24 DIAGNOSIS — R579 Shock, unspecified: Secondary | ICD-10-CM | POA: Diagnosis present

## 2021-01-24 DIAGNOSIS — I953 Hypotension of hemodialysis: Secondary | ICD-10-CM | POA: Diagnosis not present

## 2021-01-24 DIAGNOSIS — E860 Dehydration: Secondary | ICD-10-CM | POA: Diagnosis present

## 2021-01-24 DIAGNOSIS — Z79899 Other long term (current) drug therapy: Secondary | ICD-10-CM | POA: Diagnosis not present

## 2021-01-24 DIAGNOSIS — Z794 Long term (current) use of insulin: Secondary | ICD-10-CM | POA: Diagnosis not present

## 2021-01-24 DIAGNOSIS — Z992 Dependence on renal dialysis: Secondary | ICD-10-CM | POA: Diagnosis not present

## 2021-01-24 DIAGNOSIS — E1110 Type 2 diabetes mellitus with ketoacidosis without coma: Principal | ICD-10-CM

## 2021-01-24 DIAGNOSIS — U071 COVID-19: Secondary | ICD-10-CM

## 2021-01-24 DIAGNOSIS — J440 Chronic obstructive pulmonary disease with acute lower respiratory infection: Secondary | ICD-10-CM | POA: Diagnosis present

## 2021-01-24 DIAGNOSIS — R55 Syncope and collapse: Secondary | ICD-10-CM

## 2021-01-24 DIAGNOSIS — M898X9 Other specified disorders of bone, unspecified site: Secondary | ICD-10-CM | POA: Diagnosis present

## 2021-01-24 DIAGNOSIS — R571 Hypovolemic shock: Secondary | ICD-10-CM | POA: Diagnosis present

## 2021-01-24 DIAGNOSIS — J1282 Pneumonia due to coronavirus disease 2019: Secondary | ICD-10-CM | POA: Diagnosis present

## 2021-01-24 DIAGNOSIS — I5021 Acute systolic (congestive) heart failure: Secondary | ICD-10-CM | POA: Diagnosis not present

## 2021-01-24 DIAGNOSIS — I5022 Chronic systolic (congestive) heart failure: Secondary | ICD-10-CM | POA: Diagnosis present

## 2021-01-24 DIAGNOSIS — R9431 Abnormal electrocardiogram [ECG] [EKG]: Secondary | ICD-10-CM | POA: Diagnosis not present

## 2021-01-24 DIAGNOSIS — Z5900 Homelessness unspecified: Secondary | ICD-10-CM | POA: Diagnosis not present

## 2021-01-24 DIAGNOSIS — E1151 Type 2 diabetes mellitus with diabetic peripheral angiopathy without gangrene: Secondary | ICD-10-CM | POA: Diagnosis present

## 2021-01-24 DIAGNOSIS — N186 End stage renal disease: Secondary | ICD-10-CM

## 2021-01-24 DIAGNOSIS — I509 Heart failure, unspecified: Secondary | ICD-10-CM

## 2021-01-24 DIAGNOSIS — D631 Anemia in chronic kidney disease: Secondary | ICD-10-CM | POA: Diagnosis present

## 2021-01-24 DIAGNOSIS — E785 Hyperlipidemia, unspecified: Secondary | ICD-10-CM | POA: Diagnosis present

## 2021-01-24 DIAGNOSIS — J9601 Acute respiratory failure with hypoxia: Secondary | ICD-10-CM | POA: Diagnosis present

## 2021-01-24 DIAGNOSIS — F17210 Nicotine dependence, cigarettes, uncomplicated: Secondary | ICD-10-CM | POA: Diagnosis present

## 2021-01-24 DIAGNOSIS — E0811 Diabetes mellitus due to underlying condition with ketoacidosis with coma: Secondary | ICD-10-CM

## 2021-01-24 DIAGNOSIS — E0810 Diabetes mellitus due to underlying condition with ketoacidosis without coma: Secondary | ICD-10-CM | POA: Diagnosis not present

## 2021-01-24 DIAGNOSIS — E1122 Type 2 diabetes mellitus with diabetic chronic kidney disease: Secondary | ICD-10-CM | POA: Diagnosis present

## 2021-01-24 DIAGNOSIS — K219 Gastro-esophageal reflux disease without esophagitis: Secondary | ICD-10-CM | POA: Diagnosis present

## 2021-01-24 DIAGNOSIS — I132 Hypertensive heart and chronic kidney disease with heart failure and with stage 5 chronic kidney disease, or end stage renal disease: Secondary | ICD-10-CM | POA: Diagnosis present

## 2021-01-24 DIAGNOSIS — I959 Hypotension, unspecified: Secondary | ICD-10-CM | POA: Diagnosis present

## 2021-01-24 DIAGNOSIS — E11649 Type 2 diabetes mellitus with hypoglycemia without coma: Secondary | ICD-10-CM | POA: Diagnosis not present

## 2021-01-24 DIAGNOSIS — E1310 Other specified diabetes mellitus with ketoacidosis without coma: Secondary | ICD-10-CM | POA: Diagnosis not present

## 2021-01-24 DIAGNOSIS — I2510 Atherosclerotic heart disease of native coronary artery without angina pectoris: Secondary | ICD-10-CM | POA: Diagnosis present

## 2021-01-24 LAB — I-STAT ARTERIAL BLOOD GAS, ED
Acid-base deficit: 4 mmol/L — ABNORMAL HIGH (ref 0.0–2.0)
Bicarbonate: 23.1 mmol/L (ref 20.0–28.0)
Calcium, Ion: 1.1 mmol/L — ABNORMAL LOW (ref 1.15–1.40)
HCT: 41 % (ref 39.0–52.0)
Hemoglobin: 13.9 g/dL (ref 13.0–17.0)
O2 Saturation: 94 %
Patient temperature: 98.5
Potassium: 3.3 mmol/L — ABNORMAL LOW (ref 3.5–5.1)
Sodium: 137 mmol/L (ref 135–145)
TCO2: 24 mmol/L (ref 22–32)
pCO2 arterial: 46.6 mmHg (ref 32.0–48.0)
pH, Arterial: 7.302 — ABNORMAL LOW (ref 7.350–7.450)
pO2, Arterial: 76 mmHg — ABNORMAL LOW (ref 83.0–108.0)

## 2021-01-24 LAB — FERRITIN: Ferritin: 690 ng/mL — ABNORMAL HIGH (ref 24–336)

## 2021-01-24 LAB — HEPATIC FUNCTION PANEL
ALT: 55 U/L — ABNORMAL HIGH (ref 0–44)
AST: 43 U/L — ABNORMAL HIGH (ref 15–41)
Albumin: 2.7 g/dL — ABNORMAL LOW (ref 3.5–5.0)
Alkaline Phosphatase: 111 U/L (ref 38–126)
Bilirubin, Direct: 0.2 mg/dL (ref 0.0–0.2)
Indirect Bilirubin: 0.6 mg/dL (ref 0.3–0.9)
Total Bilirubin: 0.8 mg/dL (ref 0.3–1.2)
Total Protein: 6.2 g/dL — ABNORMAL LOW (ref 6.5–8.1)

## 2021-01-24 LAB — FIBRINOGEN: Fibrinogen: 364 mg/dL (ref 210–475)

## 2021-01-24 LAB — GLUCOSE, CAPILLARY
Glucose-Capillary: 108 mg/dL — ABNORMAL HIGH (ref 70–99)
Glucose-Capillary: 111 mg/dL — ABNORMAL HIGH (ref 70–99)
Glucose-Capillary: 113 mg/dL — ABNORMAL HIGH (ref 70–99)
Glucose-Capillary: 153 mg/dL — ABNORMAL HIGH (ref 70–99)
Glucose-Capillary: 143 mg/dL — ABNORMAL HIGH (ref 70–99)

## 2021-01-24 LAB — BASIC METABOLIC PANEL
Anion gap: 15 (ref 5–15)
Anion gap: 15 (ref 5–15)
Anion gap: 15 (ref 5–15)
Anion gap: 18 — ABNORMAL HIGH (ref 5–15)
BUN: 22 mg/dL (ref 8–23)
BUN: 24 mg/dL — ABNORMAL HIGH (ref 8–23)
BUN: 28 mg/dL — ABNORMAL HIGH (ref 8–23)
BUN: 32 mg/dL — ABNORMAL HIGH (ref 8–23)
CO2: 21 mmol/L — ABNORMAL LOW (ref 22–32)
CO2: 22 mmol/L (ref 22–32)
CO2: 20 mmol/L — ABNORMAL LOW (ref 22–32)
CO2: 17 mmol/L — ABNORMAL LOW (ref 22–32)
Calcium: 7.8 mg/dL — ABNORMAL LOW (ref 8.9–10.3)
Calcium: 7.6 mg/dL — ABNORMAL LOW (ref 8.9–10.3)
Calcium: 7.9 mg/dL — ABNORMAL LOW (ref 8.9–10.3)
Calcium: 7.6 mg/dL — ABNORMAL LOW (ref 8.9–10.3)
Chloride: 97 mmol/L — ABNORMAL LOW (ref 98–111)
Chloride: 98 mmol/L (ref 98–111)
Chloride: 98 mmol/L (ref 98–111)
Chloride: 99 mmol/L (ref 98–111)
Creatinine, Ser: 6.55 mg/dL — ABNORMAL HIGH (ref 0.61–1.24)
Creatinine, Ser: 6.77 mg/dL — ABNORMAL HIGH (ref 0.61–1.24)
Creatinine, Ser: 6.92 mg/dL — ABNORMAL HIGH (ref 0.61–1.24)
Creatinine, Ser: 7.15 mg/dL — ABNORMAL HIGH (ref 0.61–1.24)
GFR, Estimated: 9 mL/min — ABNORMAL LOW (ref >60)
GFR, Estimated: 9 mL/min — ABNORMAL LOW (ref >60)
GFR, Estimated: 8 mL/min — ABNORMAL LOW (ref >60)
GFR, Estimated: 8 mL/min — ABNORMAL LOW (ref >60)
Glucose, Bld: 307 mg/dL — ABNORMAL HIGH (ref 70–99)
Glucose, Bld: 154 mg/dL — ABNORMAL HIGH (ref 70–99)
Glucose, Bld: 108 mg/dL — ABNORMAL HIGH (ref 70–99)
Glucose, Bld: 161 mg/dL — ABNORMAL HIGH (ref 70–99)
Potassium: 4.1 mmol/L (ref 3.5–5.1)
Potassium: 3.7 mmol/L (ref 3.5–5.1)
Potassium: 3.9 mmol/L (ref 3.5–5.1)
Potassium: 4 mmol/L (ref 3.5–5.1)
Sodium: 133 mmol/L — ABNORMAL LOW (ref 135–145)
Sodium: 135 mmol/L (ref 135–145)
Sodium: 133 mmol/L — ABNORMAL LOW (ref 135–145)
Sodium: 134 mmol/L — ABNORMAL LOW (ref 135–145)

## 2021-01-24 LAB — ECHOCARDIOGRAM LIMITED
Area-P 1/2: 3.65 cm2
Calc EF: 33.8 %
S' Lateral: 3.3 cm
Single Plane A2C EF: 37.5 %
Single Plane A4C EF: 35.2 %
Weight: 3040 oz

## 2021-01-24 LAB — HIV ANTIBODY (ROUTINE TESTING W REFLEX): HIV Screen 4th Generation wRfx: NONREACTIVE

## 2021-01-24 LAB — HEPARIN LEVEL (UNFRACTIONATED)
Heparin Unfractionated: 0.43 IU/mL (ref 0.30–0.70)
Heparin Unfractionated: 0.11 IU/mL — ABNORMAL LOW (ref 0.30–0.70)

## 2021-01-24 LAB — LACTATE DEHYDROGENASE: LDH: 221 U/L — ABNORMAL HIGH (ref 98–192)

## 2021-01-24 LAB — TROPONIN I (HIGH SENSITIVITY)
Troponin I (High Sensitivity): 63 ng/L — ABNORMAL HIGH (ref <18)
Troponin I (High Sensitivity): 73 ng/L — ABNORMAL HIGH (ref <18)

## 2021-01-24 LAB — C-REACTIVE PROTEIN: CRP: 13.3 mg/dL — ABNORMAL HIGH (ref <1.0)

## 2021-01-24 LAB — CBG MONITORING, ED
Glucose-Capillary: 267 mg/dL — ABNORMAL HIGH (ref 70–99)
Glucose-Capillary: 196 mg/dL — ABNORMAL HIGH (ref 70–99)
Glucose-Capillary: 141 mg/dL — ABNORMAL HIGH (ref 70–99)
Glucose-Capillary: 136 mg/dL — ABNORMAL HIGH (ref 70–99)
Glucose-Capillary: 80 mg/dL (ref 70–99)
Glucose-Capillary: 85 mg/dL (ref 70–99)
Glucose-Capillary: 110 mg/dL — ABNORMAL HIGH (ref 70–99)

## 2021-01-24 LAB — BETA-HYDROXYBUTYRIC ACID
Beta-Hydroxybutyric Acid: 1.14 mmol/L — ABNORMAL HIGH (ref 0.05–0.27)
Beta-Hydroxybutyric Acid: 0.07 mmol/L (ref 0.05–0.27)
Beta-Hydroxybutyric Acid: <0.05 mmol/L — ABNORMAL LOW (ref 0.05–0.27)

## 2021-01-24 LAB — HEMOGLOBIN A1C
Hgb A1c MFr Bld: 10.4 % — ABNORMAL HIGH (ref 4.8–5.6)
Mean Plasma Glucose: 251.78 mg/dL

## 2021-01-24 LAB — LACTIC ACID, PLASMA: Lactic Acid, Venous: 2.8 mmol/L (ref 0.5–1.9)

## 2021-01-24 LAB — D-DIMER, QUANTITATIVE: D-Dimer, Quant: 1.58 ug/mL-FEU — ABNORMAL HIGH (ref 0.00–0.50)

## 2021-01-24 LAB — PROCALCITONIN: Procalcitonin: 41.7 ng/mL

## 2021-01-24 MED ORDER — LACTATED RINGERS IV BOLUS
500.0000 mL | Freq: Once | INTRAVENOUS | Status: AC
  Administered 2021-01-24: 500 mL via INTRAVENOUS

## 2021-01-24 MED ORDER — ASPIRIN 81 MG PO CHEW: 324.0000 mg | CHEWABLE_TABLET | Freq: Once | ORAL | Status: DC

## 2021-01-24 MED ORDER — ATORVASTATIN CALCIUM 10 MG PO TABS
20.0000 mg | ORAL_TABLET | Freq: Every day | ORAL | Status: DC
  Administered 2021-01-24 – 2021-02-09 (×17): 20 mg via ORAL
  Filled 2021-01-24 (×17): qty 2

## 2021-01-24 MED ORDER — CHLORHEXIDINE GLUCONATE CLOTH 2 % EX PADS
6.0000 | MEDICATED_PAD | Freq: Every day | CUTANEOUS | Status: DC
  Administered 2021-01-25 – 2021-02-09 (×15): 6 via TOPICAL

## 2021-01-24 MED ORDER — POTASSIUM CHLORIDE CRYS ER 20 MEQ PO TBCR: 40.0000 meq | EXTENDED_RELEASE_TABLET | Freq: Two times a day (BID) | ORAL | Status: DC

## 2021-01-24 MED ORDER — HEPARIN BOLUS VIA INFUSION
2000.0000 [IU] | Freq: Once | INTRAVENOUS | Status: AC
  Administered 2021-01-24: 2000 [IU] via INTRAVENOUS
  Filled 2021-01-24: qty 2000

## 2021-01-24 MED ORDER — HEPARIN (PORCINE) 25000 UT/250ML-% IV SOLN
1450.0000 [IU]/h | INTRAVENOUS | Status: DC
  Administered 2021-01-24 (×2): 1000 [IU]/h via INTRAVENOUS
  Filled 2021-01-24 (×2): qty 250

## 2021-01-24 MED ORDER — IPRATROPIUM-ALBUTEROL 20-100 MCG/ACT IN AERS: 1.0000 | INHALATION_SPRAY | Freq: Four times a day (QID) | RESPIRATORY_TRACT | Status: DC | PRN

## 2021-01-24 MED ORDER — INSULIN REGULAR(HUMAN) IN NACL 100-0.9 UT/100ML-% IV SOLN
INTRAVENOUS | Status: DC
  Administered 2021-01-24: 8.5 [IU]/h via INTRAVENOUS
  Filled 2021-01-24: qty 100

## 2021-01-24 MED ORDER — IPRATROPIUM-ALBUTEROL 0.5-2.5 (3) MG/3ML IN SOLN
3.0000 mL | Freq: Four times a day (QID) | RESPIRATORY_TRACT | Status: DC
  Filled 2021-01-24: qty 3

## 2021-01-24 MED ORDER — DEXTROSE IN LACTATED RINGERS 5 % IV SOLN: INTRAVENOUS | Status: DC

## 2021-01-24 MED ORDER — SODIUM CHLORIDE 0.9 % IV SOLN
200.0000 mg | Freq: Once | INTRAVENOUS | Status: AC
  Administered 2021-01-24: 200 mg via INTRAVENOUS
  Filled 2021-01-24: qty 200

## 2021-01-24 MED ORDER — DEXTROSE 50 % IV SOLN
0.0000 mL | INTRAVENOUS | Status: DC | PRN
Start: 2021-01-24 — End: 2021-02-09

## 2021-01-24 MED ORDER — INSULIN REGULAR(HUMAN) IN NACL 100-0.9 UT/100ML-% IV SOLN
INTRAVENOUS | Status: DC
  Administered 2021-01-24: 1.9 [IU]/h via INTRAVENOUS
  Administered 2021-01-25: 0.7 [IU]/h via INTRAVENOUS

## 2021-01-24 MED ORDER — NOREPINEPHRINE 4 MG/250ML-% IV SOLN
0.0000 ug/min | INTRAVENOUS | Status: DC
  Administered 2021-01-24 (×2): 5 ug/min via INTRAVENOUS
  Administered 2021-01-25: 3 ug/min via INTRAVENOUS
  Filled 2021-01-24 (×4): qty 250

## 2021-01-24 MED ORDER — PIPERACILLIN-TAZOBACTAM 3.375 G IVPB 30 MIN
3.3750 g | Freq: Once | INTRAVENOUS | Status: AC
  Administered 2021-01-24: 3.375 g via INTRAVENOUS
  Filled 2021-01-24: qty 50

## 2021-01-24 MED ORDER — ALBUTEROL SULFATE HFA 108 (90 BASE) MCG/ACT IN AERS: 2.0000 | INHALATION_SPRAY | RESPIRATORY_TRACT | Status: DC | PRN

## 2021-01-24 MED ORDER — VITAMIN D 25 MCG (1000 UNIT) PO TABS
1000.0000 [IU] | ORAL_TABLET | Freq: Every day | ORAL | Status: DC
  Administered 2021-01-25 – 2021-02-09 (×16): 1000 [IU] via ORAL
  Filled 2021-01-24 (×16): qty 1

## 2021-01-24 MED ORDER — POTASSIUM CHLORIDE CRYS ER 20 MEQ PO TBCR
40.0000 meq | EXTENDED_RELEASE_TABLET | Freq: Once | ORAL | Status: AC
  Administered 2021-01-24: 40 meq via ORAL
  Filled 2021-01-24: qty 2

## 2021-01-24 MED ORDER — ASPIRIN 81 MG PO CHEW
81.0000 mg | CHEWABLE_TABLET | Freq: Every day | ORAL | Status: DC
  Administered 2021-01-25 – 2021-02-09 (×16): 81 mg via ORAL
  Filled 2021-01-24 (×16): qty 1

## 2021-01-24 MED ORDER — VANCOMYCIN HCL 1750 MG/350ML IV SOLN
1750.0000 mg | Freq: Once | INTRAVENOUS | Status: AC
  Administered 2021-01-24: 1750 mg via INTRAVENOUS
  Filled 2021-01-24: qty 350

## 2021-01-24 MED ORDER — ZINC SULFATE 220 (50 ZN) MG PO CAPS
220.0000 mg | ORAL_CAPSULE | Freq: Every day | ORAL | Status: DC
  Administered 2021-01-24 – 2021-02-09 (×17): 220 mg via ORAL
  Filled 2021-01-24 (×17): qty 1

## 2021-01-24 MED ORDER — SODIUM CHLORIDE 0.9 % IV SOLN: 100.0000 mg | Freq: Every day | INTRAVENOUS | Status: DC

## 2021-01-24 MED ORDER — LACTATED RINGERS IV SOLN: INTRAVENOUS | Status: DC

## 2021-01-24 MED ORDER — HEPARIN SODIUM (PORCINE) 5000 UNIT/ML IJ SOLN: 5000.0000 [IU] | Freq: Three times a day (TID) | INTRAMUSCULAR | Status: DC

## 2021-01-24 MED ORDER — INSULIN REGULAR(HUMAN) IN NACL 100-0.9 UT/100ML-% IV SOLN: INTRAVENOUS | Status: DC

## 2021-01-24 MED ORDER — PANTOPRAZOLE SODIUM 40 MG IV SOLR
40.0000 mg | INTRAVENOUS | Status: DC
  Administered 2021-01-24 – 2021-01-25 (×2): 40 mg via INTRAVENOUS
  Filled 2021-01-24 (×2): qty 40

## 2021-01-24 MED ORDER — GUAIFENESIN-DM 100-10 MG/5ML PO SYRP: 10.0000 mL | ORAL_SOLUTION | ORAL | Status: DC | PRN

## 2021-01-24 MED ORDER — VANCOMYCIN VARIABLE DOSE PER UNSTABLE RENAL FUNCTION (PHARMACIST DOSING): Status: DC

## 2021-01-24 MED ORDER — PIPERACILLIN-TAZOBACTAM IN DEX 2-0.25 GM/50ML IV SOLN
2.2500 g | Freq: Three times a day (TID) | INTRAVENOUS | Status: DC
  Administered 2021-01-24 – 2021-01-25 (×3): 2.25 g via INTRAVENOUS
  Filled 2021-01-24 (×4): qty 50

## 2021-01-24 MED ORDER — ASCORBIC ACID 500 MG PO TABS
500.0000 mg | ORAL_TABLET | Freq: Every day | ORAL | Status: DC
  Administered 2021-01-24 – 2021-02-09 (×17): 500 mg via ORAL
  Filled 2021-01-24 (×17): qty 1

## 2021-01-24 MED ORDER — HEPARIN BOLUS VIA INFUSION
3000.0000 [IU] | Freq: Once | INTRAVENOUS | Status: AC
  Administered 2021-01-24: 3000 [IU] via INTRAVENOUS
  Filled 2021-01-24: qty 3000

## 2021-01-24 NOTE — ED Notes (Signed)
Attempted report 

## 2021-01-24 NOTE — Procedures (Signed)
Arterial Catheter Insertion Procedure Note  SUPREME CEFALO  [id number]  April 22, 1956  Date:01/24/21  Time:6:40 AM    Provider Performing: Collier Bullock    Procedure: Insertion of Arterial Line [phone]) with US guidance [id number])   Indication(s) Blood pressure monitoring and/or need for frequent ABGs  Consent Risks of the procedure as well as the alternatives and risks of each were explained to the patient and/or caregiver.  Consent for the procedure was obtained and is signed in the bedside chart  Anesthesia lidocaine   Time Out Verified patient identification, verified procedure, site/side was marked, verified correct patient position, special equipment/implants available, medications/allergies/relevant history reviewed, required imaging and test results available.   Sterile Technique Maximal sterile technique including full sterile barrier drape, hand hygiene, sterile gown, sterile gloves, mask, hair covering, sterile ultrasound probe cover (if used).   Procedure Description Area of catheter insertion was cleaned with chlorhexidine and draped in sterile fashion. With real-time ultrasound guidance an arterial catheter was placed into the left femoral artery.  Appropriate arterial tracings confirmed on monitor.     Complications/Tolerance None; patient tolerated the procedure well.   EBL Minimal\   Specimen(s) None

## 2021-01-24 NOTE — Consult Note (Signed)
Renal Service Consult Note [facility]  Jose Robinson 01/24/2021 Jose Blazing, MD Requesting Physician: Dr. Charlsie Robinson  Reason for Consult: ESRD pt w/  HPI: The patient is a 65 y.o. year-old w/ hx of DM2, HTN, ESRD on HD presented this am w/ SOB, cough, syncope and hypotension. Pt is + for COVID. Pt presented to ED yest after HD w/ SOB , gen weakness. Passed out in the ED lobby. Admitted by [facility], got 1.7 L bolus. Hypotension improved then worsened again. This am seen by CCM and started on IV insulin and IV pressor support. CVC was placed. ECHO showed EF 45-50%. Asked to see for ESRD.    Pt seen in ED Trauma bay A. Pt groggy but awakens and follows instructions. No CP or abd pain at this time. No prod cough, no n/v/d.   ROS  denies CP  no joint pain   no HA  no blurry vision  no rash  no diarrhea  no nausea/ vomiting   Past Medical History  Past Medical History:  Diagnosis Date  . Diabetes mellitus without complication ([place])   . Hypertension   . Renal disorder    Past Surgical History  Past Surgical History:  Procedure Laterality Date  . BACK SURGERY    . BELOW KNEE LEG AMPUTATION     Family History History reviewed. No pertinent family history. Social History  reports that he has been smoking. He has never used smokeless tobacco. He reports previous alcohol use. He reports previous drug use. Allergies  Allergies  Allergen Reactions  . Acetaminophen Palpitations  . Prednisone Other (See Comments)    Severe hallucinations/paranoid delusions after steroid premedications for contrast allergy, required IV haldol and restraints. Severe hallucinations/paranoid delusions after steroid premedications for contrast allergy, required IV haldol and restraints.   Jose Robinson [Iodinated Diagnostic Agents]    Home medications Prior to Admission medications   Medication Sig Start Date End Date Taking? Authorizing Provider  calcium acetate (PHOSLO) 667 MG capsule  Take 2,001 mg by mouth in the morning, at noon, and at bedtime. 02/24/14  Yes [provider]  donepezil (ARICEPT) 10 MG tablet Take 10 mg by mouth daily. 12/30/20  Yes [provider]  gabapentin (NEURONTIN) 100 MG capsule Take 100 mg by mouth 3 (three) times daily. 12/30/20  Yes [provider]  glucagon 1 MG injection Inject 1 mg into the muscle daily as needed (low blood sugar). 06/05/18  Yes [provider]  midodrine (PROAMATINE) 5 MG tablet Take 5 mg by mouth every Tuesday, Thursday, and Saturday at 6 PM. 04/04/20  Yes [provider]  multivitamin (RENA-VIT) TABS tablet Take 1 tablet by mouth daily. 01/02/21  Yes [provider]  nystatin ointment (MYCOSTATIN) Apply 1 application topically in the morning and at bedtime. 02/21/20 02/20/21 Yes [provider]  omeprazole (PRILOSEC) 20 MG capsule Take 20 mg by mouth in the morning and at bedtime.   Yes [provider]  ondansetron (ZOFRAN) 4 MG tablet Take 4 mg by mouth every 8 (eight) hours as needed for nausea or vomiting. 02/02/18  Yes [provider]  rosuvastatin (CRESTOR) 10 MG tablet Take 10 mg by mouth at bedtime. 12/30/20  Yes [provider]  NOVOLOG FLEXPEN 100 UNIT/ML FlexPen Inject into the skin. 12/30/20   [provider]  Pancrelipase, Lip-Prot-Amyl, 5000-24000 units CPEP Take 1 capsule by mouth in the morning, at noon, and at bedtime.    [provider]  sevelamer carbonate (RENVELA) 800 MG tablet Take 800 mg by mouth 3 (three) times daily. 12/30/20   [provider]     Vitals:   01/24/21 1345 01/24/21 1400 01/24/21 1415 01/24/21 1430  BP: (!) 104/58 116/82 (!) 95/33 (!) 108/49  Pulse:      Resp: (!) 24   20  Temp:      TempSrc:      SpO2:      Weight:       Exam Gen groggy, awakens easily, O x3 No rash, cyanosis or gangrene Sclera anicteric, throat clear  No jvd or bruits Chest clear bilat to bases RRR no MRG Abd  soft ntnd no mass or ascites +bs GU normal male MS no joint effusions or deformity, bilat finger amps well healed Ext R BKA, LLE 1+ edema, no ulcers/ wounds Neuro is alert, Ox 3 , nf  L IJ TDC    Home meds:  - aricept 10/ neurontin 100 tid  - midodrine '5mg'$  pre HD TTW  - renvela 800 ac tid/ phoslo 3 ac tid  - prilosec 20 qd/ crestor 10 hs  - novolog flexpen tid ac  - pancreatic enzymes tid ac  - prn's/ vitamins/ supplements    OP HD: TTS  NW  4h   400/ 800  68.5kg   2/ 3.5Ca  TDC  Hep 4000  -  hect 1ug   - last HD 1/25    CXR 1/25 - IS edema bilat + patchy bilat infiltrates  Assessment/ Plan: 1. Hypotension - septic vs cardiogenic, on pressor support now per CCM, empiric IV abx.  2. Resp failure - COVID pna and/or bact pna. Hx COPD.  3. NSTEMI - per CCM 4. ESRD - pt had HD yesterday, now indication for acute HD. Will plan HD tomorrow. Has TDC if BP's too low we can do CRRT.  5. DM2/ PAD - on insulin at home. sp R BKA, bilat finger amps. Per pmd 6. BP/ volume - no gross vol overload on exam. CXR could be viral pna causing IS edema. Min UF w/ HD given hypotension tomorrow.  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 Anemia ckd - Hb 13, follow      Jose Kinzi Frediani  MD 01/24/2021, 2:59 PM  Recent Labs  Lab 01/23/21 1438 01/23/21 2058 01/24/21 0537  WBC 8.1  --   --   HGB 15.2 16.0  16.3 13.9   Recent Labs  Lab 01/24/21 0644 01/24/21 1338  K 3.7 3.9  BUN 24* 28*  CREATININE 6.77* 6.92*  CALCIUM 7.6* 7.9*

## 2021-01-24 NOTE — Progress Notes (Signed)
[place] for Heparin Indication: chest pain/ACS  Allergies  Allergen Reactions  . Acetaminophen Palpitations  . Prednisone Other (See Comments)    Severe hallucinations/paranoid delusions after steroid premedications for contrast allergy, required IV haldol and restraints. Severe hallucinations/paranoid delusions after steroid premedications for contrast allergy, required IV haldol and restraints.   [name] [Iodinated Diagnostic Agents]     Patient Measurements: Weight: 86.2 kg (190 lb) Heparin Dosing Weight: 86.2 kg  Vital Signs: BP: 108/49 (01/26 1430) Pulse Rate: 49 (01/26 0815)  Labs: Recent Labs    01/23/21 1438 01/23/21 1733 01/23/21 2058 01/24/21 0155 01/24/21 0537 01/24/21 0644 01/24/21 1338 01/24/21 1339  HGB 15.2  --  16.0  16.3  --  13.9  --   --   --   HCT 45.6  --  47.0  48.0  --  41.0  --   --   --   PLT 132*  --   --   --   --   --   --   --   HEPARINUNFRC  --   --   --   --   --   --   --  0.43  CREATININE 6.48*  --  5.90* 6.55*  --  6.77* 6.92*  --   TROPONINIHS  --  59*  --  63*  --   --  73*  --     CrCl cannot be calculated (Unknown ideal weight.).   Medical History: Past Medical History:  Diagnosis Date  . Diabetes mellitus without complication ([place])   . Hypertension   . Renal disorder     Medications:  Scheduled:  . vitamin C  500 mg Oral Daily  . aspirin  324 mg Oral Once  . [START ON 01/25/2021] aspirin  81 mg Oral Daily  . atorvastatin  20 mg Oral Daily  . cholecalciferol  1,000 Units Oral Daily  . ipratropium-albuterol  3 mL Nebulization Q6H  . pantoprazole (PROTONIX) IV  40 mg Intravenous Q24H  . vancomycin variable dose per unstable renal function (pharmacist dosing)   Does not apply See admin instructions  . zinc sulfate  220 mg Oral Daily    Assessment: Patient is a 32 yom that is being admitted for hypotension septic vs cardiogenic shock. Patient was found to have elevated trop  levels and concern for an NSTEMI. Pharmacy has been consulted to dose heparin at this time for ACS   Goal of Therapy:  Heparin level 0.3-0.7 units/ml Monitor platelets by anticoagulation protocol: Yes   Plan:  - HL therapeutic @ 0.43  - Continue Heparin drip @ 1000 units/hr - Repeat Heparin level in ~ 8 hours  - Monitor patient for s/s of bleeding and CBC while on heparin   Duanne Limerick PharmD. BCPS  01/24/2021,3:01 PM

## 2021-01-24 NOTE — Progress Notes (Signed)
[place] for Heparin Indication: chest pain/ACS  Allergies  Allergen Reactions  . Acetaminophen Palpitations  . Prednisone Other (See Comments)    Severe hallucinations/paranoid delusions after steroid premedications for contrast allergy, required IV haldol and restraints. Severe hallucinations/paranoid delusions after steroid premedications for contrast allergy, required IV haldol and restraints.   [name] [Iodinated Diagnostic Agents]     Patient Measurements: Height: '5\' 11"'$  (180.3 cm) Weight: 86.2 kg (190 lb) IBW/kg (Calculated) : 75.3 Heparin Dosing Weight: 86.2 kg  Vital Signs: Temp: 98.3 F (36.8 C) (01/26 1745) Temp Source: Oral (01/26 1745) BP: 137/117 (01/26 2000) Pulse Rate: 102 (01/26 2000)  Labs: Recent Labs    01/23/21 1438 01/23/21 1733 01/23/21 2058 01/24/21 0155 01/24/21 0537 01/24/21 0644 01/24/21 1338 01/24/21 1339 01/24/21 1801 01/24/21 2027  HGB 15.2  --  16.0  16.3  --  13.9  --   --   --   --   --   HCT 45.6  --  47.0  48.0  --  41.0  --   --   --   --   --   PLT 132*  --   --   --   --   --   --   --   --   --   HEPARINUNFRC  --   --   --   --   --   --   --  0.43  --  0.11*  CREATININE 6.48*  --  5.90* 6.55*  --  6.77* 6.92*  --  7.15*  --   TROPONINIHS  --  59*  --  63*  --   --  73*  --   --   --     Estimated Creatinine Clearance: 11.4 mL/min (A) (by C-G formula based on SCr of 7.15 mg/dL (H)).   Assessment: Patient is a 42 yom admitted for hypotension, septic vs cardiogenic shock.  Pharmacy has been consulted to dose heparin for ACS.  Confirmatory heparin level is sub-therapeutic.  No issue with heparin infusion per RN.  Goal of Therapy:  Heparin level 0.3-0.7 units/ml Monitor platelets by anticoagulation protocol: Yes   Plan:  Heparin 2000 units IV bolus, then Increase heparin gtt to 1250 units/hr Check 8 hr heparin level  Loletha Bertini D. Mina Marble, PharmD, BCPS, [place] 01/24/2021, 9:44 PM

## 2021-01-24 NOTE — ED Notes (Signed)
Lunch Tray Ordered @ 1032. 

## 2021-01-24 NOTE — Progress Notes (Signed)
  Echocardiogram 2D Echocardiogram has been performed.  Geoffery Lyons Swaim 01/24/2021, 7:59 AM

## 2021-01-24 NOTE — ED Notes (Signed)
Jose Robinson [phone]) called/would like a call from the patient. Thank you

## 2021-01-24 NOTE — Procedures (Signed)
Central Venous Catheter Insertion Procedure Note  Jose Robinson  [id number]  [birth year]  Date:01/24/21  Time:6:31 AM   Provider Performing:Ceara Wrightson Duwayne Heck   Procedure: Insertion of Non-tunneled Central Venous Catheter(36556) with US guidance [id number])   Indication(s) Medication administration  Called to get IV access.  Only two small peripherals were able to be placed by IV team.  R IJ assessed - no accessible vein (only small collapsable superficial veins.   Given that line is only for access (bp ok at the time), decided to place line in L femoral, did not want to intervere with L central HD catheter.  alos pt was intermittently confused and needed to be redirected.  uncomfortable laying flat.   Consent Risks of the procedure as well as the alternatives and risks of each were explained to the patient and/or caregiver.  Consent for the procedure was obtained and is signed in the bedside chart  Anesthesia Topical only with 1% lidocaine   Timeout Verified patient identification, verified procedure, site/side was marked, verified correct patient position, special equipment/implants available, medications/allergies/relevant history reviewed, required imaging and test results available.  Sterile Technique Maximal sterile technique including full sterile barrier drape, hand hygiene, sterile gown, sterile gloves, mask, hair covering, sterile ultrasound probe cover (if used).  Procedure Description Area of catheter insertion was cleaned with chlorhexidine and draped in sterile fashion.  With real-time ultrasound guidance a central venous catheter was placed into the left femoral vein. Nonpulsatile blood flow and easy flushing noted in all ports.  The catheter was sutured in place and sterile dressing applied.  Complications/Tolerance None; patient tolerated the procedure well. Chest X-ray is ordered to verify placement for internal jugular or subclavian cannulation.   Chest x-ray is not  ordered for femoral cannulation.  EBL Minimal  Specimen(s) None

## 2021-01-24 NOTE — H&P (Signed)
NAME:  Jose Robinson, MRN:  [id number], DOB:  Jan 09, 1956, LOS: 0 ADMISSION DATE:  01/23/2021, CONSULTATION DATE:  01/24/21 REFERRING MD:  Oneal Grout, CHIEF COMPLAINT:  Weakness and SOB  Brief History:  65 yo man with hx of DM, HTN, ESRD on HD, here with weakness, SOB, cough, COVID 19 PNA, episode of syncope and intermittent hypotension  History of Present Illness:  Was feeling well today until after dialysis when Jose Robinson developed weakness and dyspnea.  Said Jose Robinson felt well prior to HD (thought they removed too much fluid).  No  fevers, chills, body aches, cough, shortness of breath, chest pain, nausea, vomiting, abdominal pain, or diarrhea. Called EMS for symptoms, brought here.  Syncopal episode in ED lobby.  Received 1.7 L bolus LR in ED  Found to be covid positive.  Was admitted to hospitalist, hypotension initially improved with fluids.   Treated for mild DKA, started heparin for possible NSTEMI.    Recurrent hypotension (erratic BP numbers).  Called CC consult for need for IV access, concern about recurrent hypotension in the setting of tachypnea (100% sat on room air).   Past Medical History:  DM  HTN  ESRD on HD TTS, CAD, PAD, chronic systolic heart failure, COPD, insulin-dependent diabetes, history of right BKA (per hospitalist note today).   Hx Back surgery  BKA RLE   Significant Hospital Events:    Consults:  Dr. Justin Mend - nephrology  Procedures:  Central line 1/26 Arterial line 1/26  Significant Diagnostic Tests:  cxr IMPRESSION: 1. Diffuse interstitial and ground-glass opacities, favor edema over infection.   Micro Data:  Blood cultures pending  Antimicrobials:  Zosyn  vanc  Interim History / Subjective:    Objective   Blood pressure (!) 110/32, pulse (!) 103, temperature 98.5 F (36.9 C), temperature source Oral, resp. rate (!) 26, weight 86.2 kg, SpO2 100 %.       No intake or output data in the 24 hours ending 01/24/21 0254 Filed Weights   01/23/21  2200  Weight: 86.2 kg    Examination: General: NAD, arousable, speaking clearly when constantly engaged. Falls asleep easily HENT:NCAT, scarring over neck on R  Lungs: B wheezes Cardiovascular: sinus tachycardia, no audible murmurs Abdomen: nt, nd, nbs Extremities: R BKA Neuro: arousable as described.    Resolved Hospital Problem list     Assessment & Plan:  Hypotension:  Initially thought to be dehydrated.  On exam appears euvolemic to me.  Unable to assess IVC on bedside echo, poor windows.   BP on arterial line low,  ABG without acidemia.   Consider septic vs cardiogenic.  Did have elevated procalcitonin.  Cultures P. Covid not severe enough to cause much hypoxemia, though Jose Robinson does have B infiltrates. Sepsis due to covid is possible.  Has mildly elevated troponin, possible ST depressions in inf/lateral leads.  Unable to get good windows. Consider central line in L IJ for mixed venous sat and cvp.  More hypotensive currently on arterial line.   Will start pressors and a 500cc bolus.  Cardiology consulted.  Stat echo ordered.   Continue heparin infusion. Follow troponin.   DKA: mild on review of labs. Stop insulin when anion gap closed.   Cont fluids as tolerates, but limited by   Respiratory failure: COVID PNA ,v superimposed bacterial pna.  COPD is also listed in problems in prior notes.   Remdesivir.  Would like to start decadron but apparently Jose Robinson has had psychotic symptoms in the past with steroids, so will  hold off for now, especially since Jose Robinson is only on 2L [place] oxygen.   duonebs  NSTEMI: plan as above.  NSTEMI vs demand.   EKG slightly concerning.  Echo P.  ESRD : Dr. Justin Mend consulted. Will need dialysis intermittently while inpatient.  Has  A tunneled line.    Best practice (evaluated daily)  Diet: NPO for now Pain/Anxiety/Delirium protocol (if indicated):  VAP protocol (if indicated):  DVT prophylaxis: heparin gtt GI prophylaxis:protonix  Glucose control: Insulin  gtt  Mobility:  Disposition:ICU  Goals of Care:  Last date of multidisciplinary goals of care discussion:  Family and staff present:  Summary of discussion:  Follow up goals of care discussion due:  Code Status: Full Code.   Labs   CBC: Recent Labs  Lab 01/23/21 1438 01/23/21 2058  WBC 8.1  --   HGB 15.2 16.0  16.3  HCT 45.6 47.0  48.0  MCV 90.7  --   PLT 132*  --     Basic Metabolic Panel: Recent Labs  Lab 01/23/21 1438 01/23/21 1733 01/23/21 2058 01/24/21 0155  NA 133*  --  135  134* 133*  K 4.3  --  4.1  4.1 4.1  CL 96*  --  96* 97*  CO2 21*  --   --  21*  GLUCOSE 326*  --  302* 307*  BUN 21  --  30* 22  CREATININE 6.48*  --  5.90* 6.55*  CALCIUM 8.1*  --   --  7.8*  MG  --  1.8  --   --    GFR: CrCl cannot be calculated (Unknown ideal weight.). Recent Labs  Lab 01/23/21 1438 01/23/21 1735  WBC 8.1  --   LATICACIDVEN  --  3.2*    Liver Function Tests: Recent Labs  Lab 01/23/21 1733  AST 70*  ALT 70*  ALKPHOS 131*  BILITOT 1.4*  PROT 6.7  ALBUMIN 3.1*   No results for input(s): LIPASE, AMYLASE in the last 168 hours. Recent Labs  Lab 01/23/21 1805  AMMONIA 24    ABG    Component Value Date/Time   HCO3 25.1 01/23/2021 2058   TCO2 24 01/23/2021 2058   TCO2 26 01/23/2021 2058   ACIDBASEDEF 2.0 01/23/2021 2058   O2SAT 62.0 01/23/2021 2058     Coagulation Profile: No results for input(s): INR, PROTIME in the last 168 hours.  Cardiac Enzymes: No results for input(s): CKTOTAL, CKMB, CKMBINDEX, TROPONINI in the last 168 hours.  HbA1C: No results found for: HGBA1C  CBG: Recent Labs  Lab 01/23/21 1435 01/23/21 1728 01/23/21 2355 01/24/21 0227  GLUCAP 300* 334* 269* 267*    Review of Systems:   Negative as detailed in HPO  Past Medical History:  Jose Robinson,  has a past medical history of Diabetes mellitus without complication ([place]), Hypertension, and Renal disorder.   Surgical History:   Past Surgical History:  Procedure  Laterality Date  . BACK SURGERY    . BELOW KNEE LEG AMPUTATION       Social History:   reports that Jose Robinson has been smoking. Jose Robinson has never used smokeless tobacco. Jose Robinson reports previous alcohol use. Jose Robinson reports previous drug use.   Family History:  His family history is not on file.   Allergies Allergies  Allergen Reactions  . Acetaminophen Palpitations  . Prednisone Other (See Comments)    Severe hallucinations/paranoid delusions after steroid premedications for contrast allergy, required IV haldol and restraints. Severe hallucinations/paranoid delusions after steroid premedications for contrast allergy,  required IV haldol and restraints.   [name] [Iodinated Diagnostic Agents]      Home Medications  Prior to Admission medications   Medication Sig Start Date End Date Taking? Authorizing Provider  calcium acetate (PHOSLO) 667 MG capsule Take 2,001 mg by mouth in the morning, at noon, and at bedtime. 02/24/14  Yes [provider]  donepezil (ARICEPT) 10 MG tablet Take 10 mg by mouth daily. 12/30/20  Yes [provider]  gabapentin (NEURONTIN) 100 MG capsule Take 100 mg by mouth 3 (three) times daily. 12/30/20  Yes [provider]  glucagon 1 MG injection Inject 1 mg into the muscle daily as needed (low blood sugar). 06/05/18  Yes [provider]  midodrine (PROAMATINE) 5 MG tablet Take 5 mg by mouth every Tuesday, Thursday, and Saturday at 6 PM. 04/04/20  Yes [provider]  multivitamin (RENA-VIT) TABS tablet Take 1 tablet by mouth daily. 01/02/21  Yes [provider]  nystatin ointment (MYCOSTATIN) Apply 1 application topically in the morning and at bedtime. 02/21/20 02/20/21 Yes [provider]  omeprazole (PRILOSEC) 20 MG capsule Take 20 mg by mouth in the morning and at bedtime.   Yes [provider]  ondansetron (ZOFRAN) 4 MG tablet Take 4 mg by mouth every 8 (eight) hours as needed for nausea or vomiting. 02/02/18  Yes  [provider]  rosuvastatin (CRESTOR) 10 MG tablet Take 10 mg by mouth at bedtime. 12/30/20  Yes [provider]  NOVOLOG FLEXPEN 100 UNIT/ML FlexPen Inject into the skin. 12/30/20   [provider]  Pancrelipase, Lip-Prot-Amyl, 5000-24000 units CPEP Take 1 capsule by mouth in the morning, at noon, and at bedtime.    [provider]  sevelamer carbonate (RENVELA) 800 MG tablet Take 800 mg by mouth 3 (three) times daily. 12/30/20   [provider]     Critical care time: 60 minutes

## 2021-01-24 NOTE — Progress Notes (Signed)
Seen/examined, feeling better. Mild gap, beta hydroxybutyrate pending. On insulin gtt. Remains on 10 mcg/min levophed.  Can do trial of diet.  Hold further fluids. No remdesivir. Broad spectrum abx.  F/u culture data.  Pct markedly elevated. Trend trops, if remain low can dc heparin gtt and get noninvasive stress test at some point.  If start to spike or ACS symptoms, can get cardiology involved.  Erskine Emery MD PCCM

## 2021-01-24 NOTE — Progress Notes (Signed)
ANTICOAGULATION CONSULT NOTE - Initial Consult  Pharmacy Consult for heparin Indication: chest pain/ACS  Allergies  Allergen Reactions  . Acetaminophen Palpitations  . Prednisone Other (See Comments)    Severe hallucinations/paranoid delusions after steroid premedications for contrast allergy, required IV haldol and restraints. Severe hallucinations/paranoid delusions after steroid premedications for contrast allergy, required IV haldol and restraints.   [name] [Iodinated Diagnostic Agents]     Patient Measurements: Weight: 86.2 kg (190 lb)  Vital Signs: Temp: 98.5 F (36.9 C) (01/25 1755) Temp Source: Oral (01/25 1755) BP: 110/82 (01/25 1800) Pulse Rate: 114 (01/25 1755)  Labs: Recent Labs    01/23/21 1438 01/23/21 1733 01/23/21 2058  HGB 15.2  --  16.0  16.3  HCT 45.6  --  47.0  48.0  PLT 132*  --   --   CREATININE 6.48*  --  5.90*  TROPONINIHS  --  59*  --     Medical History: Past Medical History:  Diagnosis Date  . Diabetes mellitus without complication ([place])   . Hypertension   . Renal disorder     Assessment: 65yo male c/o weakness and SOB, found to be Covid positive with mildly elevated troponin, to begin heparin.  Goal of Therapy:  Heparin level 0.3-0.7 units/ml Monitor platelets by anticoagulation protocol: Yes   Plan:  Will give heparin 3000 units IV bolus x1 followed by gtt at 1000 units/hr and monitor heparin levels and CBC.  Wynona Neat, PharmD, BCPS  01/24/2021,12:33 AM

## 2021-01-24 NOTE — Progress Notes (Signed)
Pharmacy Antibiotic Note  Jose Robinson is a 65 y.o. male admitted on 01/23/2021 with pneumonia.  Pharmacy has been consulted for vancomycin and zosyn dosing.  Plan: Vancomycin 1750 mg IV x 1 then f/u HD schedule Zosyn 3.375 gm IV x 1 then 2.25 gm IV q8 hours F/u cultures and clinical course  Weight: 86.2 kg (190 lb)  Temp (24hrs), Avg:97.9 F (36.6 C), Min:97.3 F (36.3 C), Max:98.5 F (36.9 C)  Recent Labs  Lab 01/23/21 1438 01/23/21 1735 01/23/21 2058 01/24/21 0155  WBC 8.1  --   --   --   CREATININE 6.48*  --  5.90* 6.55*  LATICACIDVEN  --  3.2*  --   --     CrCl cannot be calculated (Unknown ideal weight.).    Allergies  Allergen Reactions  . Acetaminophen Palpitations  . Prednisone Other (See Comments)    Severe hallucinations/paranoid delusions after steroid premedications for contrast allergy, required IV haldol and restraints. Severe hallucinations/paranoid delusions after steroid premedications for contrast allergy, required IV haldol and restraints.   [name] [Iodinated Diagnostic Agents]     Thank you for allowing pharmacy to be a part of this patient's care.  Excell Seltzer Poteet 01/24/2021 6:10 AM

## 2021-01-24 NOTE — ED Notes (Signed)
Spoke with Rathore MD about pt only having one line and heparin and insulin incompatable. Per MD start insulin for now until stat troponin results

## 2021-01-25 DIAGNOSIS — R55 Syncope and collapse: Secondary | ICD-10-CM | POA: Diagnosis not present

## 2021-01-25 DIAGNOSIS — U071 COVID-19: Secondary | ICD-10-CM | POA: Diagnosis not present

## 2021-01-25 DIAGNOSIS — E1310 Other specified diabetes mellitus with ketoacidosis without coma: Secondary | ICD-10-CM | POA: Diagnosis not present

## 2021-01-25 LAB — GLUCOSE, CAPILLARY
Glucose-Capillary: 100 mg/dL — ABNORMAL HIGH (ref 70–99)
Glucose-Capillary: 107 mg/dL — ABNORMAL HIGH (ref 70–99)
Glucose-Capillary: 117 mg/dL — ABNORMAL HIGH (ref 70–99)
Glucose-Capillary: 118 mg/dL — ABNORMAL HIGH (ref 70–99)
Glucose-Capillary: 129 mg/dL — ABNORMAL HIGH (ref 70–99)
Glucose-Capillary: 130 mg/dL — ABNORMAL HIGH (ref 70–99)
Glucose-Capillary: 130 mg/dL — ABNORMAL HIGH (ref 70–99)
Glucose-Capillary: 135 mg/dL — ABNORMAL HIGH (ref 70–99)
Glucose-Capillary: 139 mg/dL — ABNORMAL HIGH (ref 70–99)
Glucose-Capillary: 141 mg/dL — ABNORMAL HIGH (ref 70–99)
Glucose-Capillary: 142 mg/dL — ABNORMAL HIGH (ref 70–99)
Glucose-Capillary: 144 mg/dL — ABNORMAL HIGH (ref 70–99)
Glucose-Capillary: 148 mg/dL — ABNORMAL HIGH (ref 70–99)
Glucose-Capillary: 159 mg/dL — ABNORMAL HIGH (ref 70–99)
Glucose-Capillary: 169 mg/dL — ABNORMAL HIGH (ref 70–99)
Glucose-Capillary: 187 mg/dL — ABNORMAL HIGH (ref 70–99)
Glucose-Capillary: 188 mg/dL — ABNORMAL HIGH (ref 70–99)
Glucose-Capillary: 211 mg/dL — ABNORMAL HIGH (ref 70–99)

## 2021-01-25 LAB — CBC WITH DIFFERENTIAL/PLATELET
Abs Immature Granulocytes: 0.12 10*3/uL — ABNORMAL HIGH (ref 0.00–0.07)
Basophils Absolute: 0 10*3/uL (ref 0.0–0.1)
Basophils Relative: 0 %
Eosinophils Absolute: 0 10*3/uL (ref 0.0–0.5)
Eosinophils Relative: 0 %
HCT: 35.9 % — ABNORMAL LOW (ref 39.0–52.0)
Hemoglobin: 12.1 g/dL — ABNORMAL LOW (ref 13.0–17.0)
Immature Granulocytes: 1 %
Lymphocytes Relative: 6 %
Lymphs Abs: 0.9 10*3/uL (ref 0.7–4.0)
MCH: 30.2 pg (ref 26.0–34.0)
MCHC: 33.7 g/dL (ref 30.0–36.0)
MCV: 89.5 fL (ref 80.0–100.0)
Monocytes Absolute: 0.8 10*3/uL (ref 0.1–1.0)
Monocytes Relative: 6 %
Neutro Abs: 12 10*3/uL — ABNORMAL HIGH (ref 1.7–7.7)
Neutrophils Relative %: 87 %
Platelets: 146 10*3/uL — ABNORMAL LOW (ref 150–400)
RBC: 4.01 MIL/uL — ABNORMAL LOW (ref 4.22–5.81)
RDW: 13.3 % (ref 11.5–15.5)
WBC: 13.8 10*3/uL — ABNORMAL HIGH (ref 4.0–10.5)
nRBC: 0 % (ref 0.0–0.2)

## 2021-01-25 LAB — BASIC METABOLIC PANEL
Anion gap: 17 — ABNORMAL HIGH (ref 5–15)
Anion gap: 13 (ref 5–15)
BUN: 34 mg/dL — ABNORMAL HIGH (ref 8–23)
BUN: 37 mg/dL — ABNORMAL HIGH (ref 8–23)
CO2: 19 mmol/L — ABNORMAL LOW (ref 22–32)
CO2: 21 mmol/L — ABNORMAL LOW (ref 22–32)
Calcium: 7.4 mg/dL — ABNORMAL LOW (ref 8.9–10.3)
Calcium: 7.2 mg/dL — ABNORMAL LOW (ref 8.9–10.3)
Chloride: 97 mmol/L — ABNORMAL LOW (ref 98–111)
Chloride: 102 mmol/L (ref 98–111)
Creatinine, Ser: 7.43 mg/dL — ABNORMAL HIGH (ref 0.61–1.24)
Creatinine, Ser: 7.71 mg/dL — ABNORMAL HIGH (ref 0.61–1.24)
GFR, Estimated: 8 mL/min — ABNORMAL LOW (ref >60)
GFR, Estimated: 7 mL/min — ABNORMAL LOW (ref >60)
Glucose, Bld: 153 mg/dL — ABNORMAL HIGH (ref 70–99)
Glucose, Bld: 155 mg/dL — ABNORMAL HIGH (ref 70–99)
Potassium: 4 mmol/L (ref 3.5–5.1)
Potassium: 3.9 mmol/L (ref 3.5–5.1)
Sodium: 133 mmol/L — ABNORMAL LOW (ref 135–145)
Sodium: 136 mmol/L (ref 135–145)

## 2021-01-25 LAB — MRSA PCR SCREENING: MRSA by PCR: NEGATIVE

## 2021-01-25 LAB — HEPATITIS B SURFACE ANTIGEN: Hepatitis B Surface Ag: NONREACTIVE

## 2021-01-25 LAB — TROPONIN I (HIGH SENSITIVITY): Troponin I (High Sensitivity): 69 ng/L — ABNORMAL HIGH (ref <18)

## 2021-01-25 LAB — HEPARIN LEVEL (UNFRACTIONATED): Heparin Unfractionated: 0.17 IU/mL — ABNORMAL LOW (ref 0.30–0.70)

## 2021-01-25 MED ORDER — PANTOPRAZOLE SODIUM 20 MG PO TBEC
20.0000 mg | DELAYED_RELEASE_TABLET | Freq: Every day | ORAL | Status: DC
  Administered 2021-01-26 – 2021-02-09 (×15): 20 mg via ORAL
  Filled 2021-01-25 (×16): qty 1

## 2021-01-25 MED ORDER — GABAPENTIN 100 MG PO CAPS
100.0000 mg | ORAL_CAPSULE | Freq: Three times a day (TID) | ORAL | Status: DC
  Administered 2021-01-25 – 2021-02-09 (×45): 100 mg via ORAL
  Filled 2021-01-25 (×45): qty 1

## 2021-01-25 MED ORDER — INSULIN GLARGINE 100 UNIT/ML ~~LOC~~ SOLN: 15.0000 [IU] | Freq: Every day | SUBCUTANEOUS | Status: DC

## 2021-01-25 MED ORDER — INSULIN ASPART 100 UNIT/ML ~~LOC~~ SOLN: 0.0000 [IU] | Freq: Three times a day (TID) | SUBCUTANEOUS | Status: DC

## 2021-01-25 MED ORDER — SODIUM CHLORIDE 0.9% FLUSH
10.0000 mL | Freq: Two times a day (BID) | INTRAVENOUS | Status: DC
  Administered 2021-01-25 – 2021-01-26 (×3): 10 mL

## 2021-01-25 MED ORDER — GUAIFENESIN-CODEINE 100-10 MG/5ML PO SOLN
5.0000 mL | ORAL | Status: DC | PRN
  Administered 2021-01-25 – 2021-01-31 (×10): 5 mL via ORAL
  Filled 2021-01-25 (×10): qty 5

## 2021-01-25 MED ORDER — HEPARIN SODIUM (PORCINE) 5000 UNIT/ML IJ SOLN
5000.0000 [IU] | Freq: Three times a day (TID) | INTRAMUSCULAR | Status: DC
  Administered 2021-01-25 – 2021-02-09 (×43): 5000 [IU] via SUBCUTANEOUS
  Filled 2021-01-25 (×44): qty 1

## 2021-01-25 MED ORDER — HEPARIN BOLUS VIA INFUSION
2000.0000 [IU] | Freq: Once | INTRAVENOUS | Status: AC
  Administered 2021-01-25: 2000 [IU] via INTRAVENOUS
  Filled 2021-01-25: qty 2000

## 2021-01-25 MED ORDER — SODIUM CHLORIDE 0.9% FLUSH: 10.0000 mL | INTRAVENOUS | Status: DC | PRN

## 2021-01-25 MED ORDER — HEPARIN SODIUM (PORCINE) 1000 UNIT/ML IJ SOLN
4800.0000 [IU] | Freq: Once | INTRAMUSCULAR | Status: AC
  Filled 2021-01-25: qty 5
  Filled 2021-01-25: qty 4.8

## 2021-01-25 MED ORDER — INSULIN GLARGINE 100 UNIT/ML ~~LOC~~ SOLN
15.0000 [IU] | Freq: Every day | SUBCUTANEOUS | Status: DC
  Filled 2021-01-25 (×2): qty 0.15

## 2021-01-25 MED ORDER — INSULIN GLARGINE 100 UNIT/ML ~~LOC~~ SOLN
20.0000 [IU] | Freq: Every day | SUBCUTANEOUS | Status: DC
  Administered 2021-01-25: 15 [IU] via SUBCUTANEOUS
  Filled 2021-01-25: qty 0.2

## 2021-01-25 NOTE — Progress Notes (Signed)
Inpatient Diabetes Program Recommendations  AACE/ADA: New Consensus Statement on Inpatient Glycemic Control (2015)  Target Ranges:  Prepandial:   less than 140 mg/dL      Peak postprandial:   less than 180 mg/dL (1-2 hours)      Critically ill patients:  140 - 180 mg/dL   Lab Results  Component Value Date   GLUCAP 141 (H) 01/25/2021   HGBA1C 10.4 (H) 01/24/2021    Review of Glycemic Control Results for Jose Robinson, Jose Robinson (MRN [id number]) as of 01/25/2021 12:55  Ref. Range 01/25/2021 08:13 01/25/2021 08:59 01/25/2021 10:04 01/25/2021 11:09 01/25/2021 12:06  Glucose-Capillary Latest Ref Range: 70 - 99 mg/dL 142 (H) 159 (H) 211 (H) 187 (H) 141 (H)   Diabetes history: DM 2 Outpatient Diabetes medications: Novolog flexpen- unsure of dose Current orders for Inpatient glycemic control:  Lantus 20 units daily (just added) Novolog moderate tid with meals Inpatient Diabetes Program Recommendations:   Please consider reducing Lantus to 10 units daily (2 hours prior to d/c of insulin drip) due to low insulin drip rates and history of ESRD,  Also consider reducing Novolog correction to sensitive q 4 hours.   Thanks,  Adah Perl, RN, BC-ADM Inpatient Diabetes Coordinator Pager [phone] (8a-5p)

## 2021-01-25 NOTE — Progress Notes (Incomplete)
Pharmacy Antibiotic Note  Jose Robinson is a 65 y.o. male admitted on 01/23/2021 with hypotension septic vs. Cardiogenic shock and COVID PNA vs. Superimposed bacterial PNA. Elevated lactic acid, CRP, and PCT.  Pharmacy has been consulted for vancomycin dosing.  Patient with HD and planned HD session on 1/27. Dosing based on a 4 hour session at BFR of 400 mL/min. Will follow up if patient does not tolerate HD.   Plan: Vancomycin 1000 mg IV once after HD. Goal trough 15-25 mcg/mL Monitor HD schedule, renal fx via BMP daily, culture results, and LOT.  Height: '5\' 11"'$  (180.3 cm) Weight: 86.2 kg (190 lb) IBW/kg (Calculated) : 75.3  Temp (24hrs), Avg:98.4 F (36.9 C), Min:98.3 F (36.8 C), Max:98.6 F (37 C)  Recent Labs  Lab 01/23/21 1438 01/23/21 1735 01/23/21 2058 01/24/21 0155 01/24/21 0644 01/24/21 1338 01/24/21 1801 01/24/21 2328  WBC 8.1  --   --   --   --   --   --   --   CREATININE 6.48*  --    < > 6.55* 6.77* 6.92* 7.15* 7.43*  LATICACIDVEN  --  3.2*  --   --  2.8*  --   --   --    < > = values in this interval not displayed.    Estimated Creatinine Clearance: 11 mL/min (A) (by C-G formula based on SCr of 7.43 mg/dL (H)).    Allergies  Allergen Reactions  . Acetaminophen Palpitations  . Prednisone Other (See Comments)    Severe hallucinations/paranoid delusions after steroid premedications for contrast allergy, required IV haldol and restraints. Severe hallucinations/paranoid delusions after steroid premedications for contrast allergy, required IV haldol and restraints.   [name] [Iodinated Diagnostic Agents]     Antimicrobials this admission: Zosyn 1/26 >>  Vancomycin 1750 mg LD 1/26 >>     Microbiology results: 1/26 BCx x 2: in progress 1/26 UCx: in progress   Thank you for allowing pharmacy to be a part of this patient's care.  Mickeal Skinner, PharmD Student 01/25/2021 6:24 AM

## 2021-01-25 NOTE — Progress Notes (Signed)
NAME:  Jose Robinson, MRN:  [id number], DOB:  [birth year], LOS: 1 ADMISSION DATE:  01/23/2021, CONSULTATION DATE:  01/24/21 REFERRING MD:  Oneal Grout, CHIEF COMPLAINT:  Weakness and SOB  Brief History:  65 yo man with hx of DM, HTN, ESRD on HD, here with weakness, SOB, cough, COVID 19 PNA, episode of syncope and intermittent hypotension  Past Medical History:  DM  HTN  ESRD on HD TTS, CAD, PAD, chronic systolic heart failure, COPD, insulin-dependent diabetes, history of right BKA (per hospitalist note today).   Hx Back surgery  BKA RLE   Significant Hospital Events:    Consults:  Nephrology PCCM Cardiology  Procedures:  Central line 1/26 Arterial line 1/26  Significant Diagnostic Tests:  cxr IMPRESSION: 1. Diffuse interstitial and ground-glass opacities, favor edema over infection.   Micro Data:  Blood cultures pending 1/27 MRSA screen negative 1/25 Covid PCR positive  Antimicrobials:  Zosyn 1/25-1/27 vanc 1/25-1/27  Interim History / Subjective:  Patient has been tolerating oral diet, he came off of vasopressors and insulin infusion Switch to long-acting insulin.  Had echocardiogram which shows wall motion abnormalities and EF 45%  Objective   Blood pressure (!) 82/61, pulse 94, temperature 98.6 F (37 C), temperature source Oral, resp. rate (!) 31, height '5\' 11"'$  (1.803 m), weight 86.2 kg, SpO2 97 %.        Intake/Output Summary (Last 24 hours) at 01/25/2021 1414 Last data filed at 01/25/2021 1407 Gross per 24 hour  Intake 3757.17 ml  Output -  Net 3757.17 ml   Filed Weights   01/23/21 2200 01/24/21 1500  Weight: 86.2 kg 86.2 kg    Examination: General: Middle-aged African-American male, lying on the bed HENT:NCAT, scarring over neck on R  Lungs: Clear to auscultation bilaterally, no wheezes or rhonchi Cardiovascular: Regular rate and rhythm, no murmur Abdomen: Soft, nontender, nondistended bowel sounds present Extremities: R BKA Neuro: Alert,  awake, oriented x3, following commands Skin: No rash   Resolved Hospital Problem list     Assessment & Plan:  Hypotension: Likely due to dehydration Patient had syncopal episode after he received hemodialysis session He received 2 L IV fluid with improvement in blood pressure Initially he required IV vasopressors but it was turned off Sepsis was ruled out Antibiotics were stopped.    Poorly controlled diabetes type 2, presented with DKA Patient received IV fluid and was started on insulin infusion His anion gap is closed now, he was switched to long-acting insulin Continue sliding scale He takes Trulicity at home, that will be started at discharge Monitor fingerstick with goal 140-180  Covid pneumonia Patient is complaining of cough, he is not hypoxic Discontinue remdesivir  Demand cardiac ischemia Patient's EKG showed ischemic changes, his troponins were slightly elevated likely due to demand cardiac ischemia from acute illness Echocardiogram showed wall motion abnormalities and EF of 45% DC IV heparin infusion We will ask cardiology consult  ESRD Nephrology consulted, patient is due for HD session today     Best practice (evaluated daily)  Diet: Diabetic diet Pain/Anxiety/Delirium protocol (if indicated): Gabapentin VAP protocol (if indicated): N/A DVT prophylaxis: Subcu heparin GI prophylaxis:protonix  Glucose control: Basal and bolus insulin Mobility: As tolerated Disposition: Cardiac telemetry  Goals of Care:  Last date of multidisciplinary goals of care discussion: 1/27  Family and staff present: Patient and his RN Summary of discussion: Continue with full aggressive care Follow up goals of care discussion due: 2/4 Code Status: Full Code.   Labs  CBC: Recent Labs  Lab 01/23/21 1438 01/23/21 2058 01/24/21 0537 01/25/21 0647  WBC 8.1  --   --  13.8*  NEUTROABS  --   --   --  12.0*  HGB 15.2 16.0  16.3 13.9 12.1*  HCT 45.6 47.0  48.0 41.0 35.9*   MCV 90.7  --   --  89.5  PLT 132*  --   --  146*    Basic Metabolic Panel: Recent Labs  Lab 01/23/21 1733 01/23/21 2058 01/24/21 0644 01/24/21 1338 01/24/21 1801 01/24/21 2328 01/25/21 0723  NA  --    < > 135 133* 134* 133* 136  K  --    < > 3.7 3.9 4.0 4.0 3.9  CL  --    < > 98 98 99 97* 102  CO2  --    < > 22 20* 17* 19* 21*  GLUCOSE  --    < > 154* 108* 161* 153* 155*  BUN  --    < > 24* 28* 32* 34* 37*  CREATININE  --    < > 6.77* 6.92* 7.15* 7.43* 7.71*  CALCIUM  --    < > 7.6* 7.9* 7.6* 7.4* 7.2*  MG 1.8  --   --   --   --   --   --    < > = values in this interval not displayed.   GFR: Estimated Creatinine Clearance: 10.6 mL/min (A) (by C-G formula based on SCr of 7.71 mg/dL (H)). Recent Labs  Lab 01/23/21 1438 01/23/21 1735 01/24/21 0155 01/24/21 0644 01/25/21 0647  PROCALCITON  --   --  41.70  --   --   WBC 8.1  --   --   --  13.8*  LATICACIDVEN  --  3.2*  --  2.8*  --     Liver Function Tests: Recent Labs  Lab 01/23/21 1733 01/24/21 0644  AST 70* 43*  ALT 70* 55*  ALKPHOS 131* 111  BILITOT 1.4* 0.8  PROT 6.7 6.2*  ALBUMIN 3.1* 2.7*   No results for input(s): LIPASE, AMYLASE in the last 168 hours. Recent Labs  Lab 01/23/21 1805  AMMONIA 24    ABG    Component Value Date/Time   PHART 7.302 (L) 01/24/2021 0537   PCO2ART 46.6 01/24/2021 0537   PO2ART 76 (L) 01/24/2021 0537   HCO3 23.1 01/24/2021 0537   TCO2 24 01/24/2021 0537   ACIDBASEDEF 4.0 (H) 01/24/2021 0537   O2SAT 94.0 01/24/2021 0537     Coagulation Profile: No results for input(s): INR, PROTIME in the last 168 hours.  Cardiac Enzymes: No results for input(s): CKTOTAL, CKMB, CKMBINDEX, TROPONINI in the last 168 hours.  HbA1C: Hgb A1c MFr Bld  Date/Time Value Ref Range Status  01/24/2021 06:56 AM 10.4 (H) 4.8 - 5.6 % Final    Comment:    (NOTE) Pre diabetes:          5.7%-6.4%  Diabetes:              >6.4%  Glycemic control for   <7.0% adults with diabetes      CBG: Recent Labs  Lab 01/25/21 1004 01/25/21 1109 01/25/21 1206 01/25/21 1306 01/25/21 1404  GLUCAP 211* 187* 141* 144* 188*    Past Medical History:  He,  has a past medical history of Diabetes mellitus without complication ([place]), ESRD on hemodialysis ([place]), and Hypertension.   Surgical History:   Past Surgical History:  Procedure Laterality Date  . BACK  SURGERY    . BELOW KNEE LEG AMPUTATION       Social History:   reports that he has been smoking. He has never used smokeless tobacco. He reports previous alcohol use. He reports previous drug use.   Family History:  His family history is not on file.   Allergies Allergies  Allergen Reactions  . Acetaminophen Palpitations  . Prednisone Other (See Comments)    Severe hallucinations/paranoid delusions after steroid premedications for contrast allergy, required IV haldol and restraints. Severe hallucinations/paranoid delusions after steroid premedications for contrast allergy, required IV haldol and restraints.   [name] [Iodinated Diagnostic Agents]      Home Medications  Prior to Admission medications   Medication Sig Start Date End Date Taking? Authorizing Provider  calcium acetate (PHOSLO) 667 MG capsule Take 2,001 mg by mouth in the morning, at noon, and at bedtime. 02/24/14  Yes [provider]  donepezil (ARICEPT) 10 MG tablet Take 10 mg by mouth daily. 12/30/20  Yes [provider]  gabapentin (NEURONTIN) 100 MG capsule Take 100 mg by mouth 3 (three) times daily. 12/30/20  Yes [provider]  glucagon 1 MG injection Inject 1 mg into the muscle daily as needed (low blood sugar). 06/05/18  Yes [provider]  midodrine (PROAMATINE) 5 MG tablet Take 5 mg by mouth every Tuesday, Thursday, and Saturday at 6 PM. 04/04/20  Yes [provider]  multivitamin (RENA-VIT) TABS tablet Take 1 tablet by mouth daily. 01/02/21  Yes [provider]  nystatin ointment (MYCOSTATIN)  Apply 1 application topically in the morning and at bedtime. 02/21/20 02/20/21 Yes [provider]  omeprazole (PRILOSEC) 20 MG capsule Take 20 mg by mouth in the morning and at bedtime.   Yes [provider]  ondansetron (ZOFRAN) 4 MG tablet Take 4 mg by mouth every 8 (eight) hours as needed for nausea or vomiting. 02/02/18  Yes [provider]  rosuvastatin (CRESTOR) 10 MG tablet Take 10 mg by mouth at bedtime. 12/30/20  Yes [provider]  NOVOLOG FLEXPEN 100 UNIT/ML FlexPen Inject into the skin. 12/30/20   [provider]  Pancrelipase, Lip-Prot-Amyl, 5000-24000 units CPEP Take 1 capsule by mouth in the morning, at noon, and at bedtime.    [provider]  sevelamer carbonate (RENVELA) 800 MG tablet Take 800 mg by mouth 3 (three) times daily. 12/30/20   [provider]      Jacky Kindle MD [facility] See Amion for pager If no response to pager, please call [phone] until 7pm After 7pm, Please call E-link [phone]

## 2021-01-25 NOTE — Progress Notes (Signed)
[facility] Progress Note  Subjective: seen in ICU, has been on HD for "25 years"! No c/o today, doesn't feel fluid overloaded.   Vitals:   01/25/21 1100 01/25/21 1200 01/25/21 1300 01/25/21 1400  BP:      Pulse: 98 92 94 (!) 105  Resp: 20 (!) 31    Temp:      TempSrc:      SpO2: 92% 97% 97% 97%  Weight:      Height:        Exam: Gen awakens easily, O x3 No jvd or bruits Chest clear bilat to bases RRR no MRG Abd soft ntnd no mass or ascites +bs MS bilat finger amps well healed Ext R BKA, LLE 1+ edema Neuro is alert, Ox 3 , nf  L IJ TDC    Home meds:  - aricept 10/ neurontin 100 tid  - midodrine '5mg'$  pre HD TTW  - renvela 800 ac tid/ phoslo 3 ac tid  - prilosec 20 qd/ crestor 10 hs  - novolog flexpen tid ac  - pancreatic enzymes tid ac  - prn's/ vitamins/ supplements    OP HD: TTS  NW  4h   400/ 800  68.5kg   2/ 3.5Ca  TDC  Hep 4000  -  hect 1ug   - last HD 1/25    CXR 1/25 - IS edema bilat + patchy bilat infiltrates  Assessment/ Plan: 1. Hypotension - septic vs cardiogenic, on low dose pressors and IV abx.  2. Resp failure - COVID pna and/or bact pna. Hx COPD.  3. NSTEMI - per CCM 4. ESRD - TTS HD. HD today in ICU on schedule.  5. DM2/ PAD - on insulin at home. sp R BKA, bilat finger amps. Per pmd 6. BP/ volume - no gross vol overload on exam, up 20kg , doubt wts are acccurate. UF 3 L w/ HD today.  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 Anemia ckd - Hb 13, follow       Rob Janis Sol 01/25/2021, 2:50 PM   Recent Labs  Lab 01/24/21 0537 01/24/21 0644 01/24/21 2328 01/25/21 0647 01/25/21 0723  K 3.3*   < > 4.0  --  3.9  BUN  --    < > 34*  --  37*  CREATININE  --    < > 7.43*  --  7.71*  CALCIUM  --    < > 7.4*  --  7.2*  HGB 13.9  --   --  12.1*  --    < > = values in this interval not displayed.   Inpatient medications: . vitamin C  500 mg Oral Daily  . aspirin  324 mg Oral Once  . aspirin  81 mg Oral Daily  . atorvastatin   20 mg Oral Daily  . Chlorhexidine Gluconate Cloth  6 each Topical Q0600  . cholecalciferol  1,000 Units Oral Daily  . gabapentin  100 mg Oral TID  . heparin injection (subcutaneous)  5,000 Units Subcutaneous Q8H  . insulin aspart  0-15 Units Subcutaneous TID WC  . insulin glargine  15 Units Subcutaneous Daily  . pantoprazole  20 mg Oral Daily  . sodium chloride flush  10-40 mL Intracatheter Q12H  . zinc sulfate  220 mg Oral Daily   . insulin 1.5 mL/hr at 01/25/21 1407   dextrose, guaiFENesin-codeine, Ipratropium-Albuterol, sodium chloride flush

## 2021-01-25 NOTE — Discharge Instructions (Addendum)
Diabetic Ketoacidosis Diabetic ketoacidosis (DKA) is a serious complication of diabetes. This condition develops when there is not enough insulin in the body. Insulin is a hormone that regulates blood sugar (glucose) levels in the body. Normally, insulin allows glucose to enter the cells in the body. The cells break down glucose for energy. Without enough insulin, the body cannot break down glucose and breaks down fats instead. This leads to high blood glucose levels in the body. It also leads to the production of acids that are called ketones. Ketones are poisonous at high levels. If diabetic ketoacidosis is not treated, it can cause severe dehydration and can lead to a coma or death. What are the causes? This condition develops when a lack of insulin causes the body to break down fats instead of glucose. This may be triggered by:  Stress on the body. This stress can be brought on by an illness.  Infection.  Medicines that raise blood glucose levels.  Not taking or skipping doses of diabetes medicines.  New onset of type 1 diabetes mellitus.  Missing insulin on purpose or by accident.  Interruption of insulin through an insulin pump. This can happen if the cannula that connects you to the insulin pump gets dislodged or kinked. What are the signs or symptoms? Symptoms of this condition include:  Excessive thirst or dry mouth.  Excessive urination.  Abdominal pain.  Nausea or vomiting.  Vision changes.  Fruity or sweet-smelling breath.  Weight loss.  Irritability or confusion.  Rapid breathing.  High blood glucose.  High levels of ketones in the body. To know your ketone levels: ? Collect urine in a small cup. ? Dip a test strip in the urine. ? Wait for it to change color. ? Compare the test strip results to the chart on the container. How is this diagnosed? This condition is diagnosed based on your medical history, a physical exam, and blood tests. You may also  have a urine test to check for ketones. How is this treated? This condition may be treated with:  Fluid replacement. This may be done with IV fluids to correct dehydration.  Correcting high blood glucose with insulin. This may be given through the skin as injections or through an IV.  Electrolyte replacement. Electrolytes are minerals in your blood. Electrolytes such as potassium and sodium may be given in pill form or through an IV.  Antibiotic medicines. These may be prescribed if your condition was caused by an infection. Diabetic ketoacidosis is a serious medical condition. You may need emergency treatment in the hospital so that you can be monitored closely. Follow these instructions at home: Medicines  Take over-the-counter and prescription medicines only as told by your health care provider.  Continue to take insulin and other diabetes medicines as told by your health care provider.  If you were prescribed an antibiotic medicine, take it as told by your health care provider. Do not stop taking the antibiotic even if you start to feel better. Eating and drinking  Drink enough fluids to keep your urine pale yellow.  If you are able to eat, follow your usual diet and drink sugar-free liquids such as water, tea, and sugar-free soft drinks. You can also have sugar-free gelatin or ice pops.  If you are not able to eat, drink liquids that contain sugar in small amounts as you are able. Liquids include fruit juice, regular soft drinks, and sherbet.   Checking ketones and blood glucose  Check your urine for  ketones when you are ill and as told by your health care provider. ? If your blood glucose is 240 mg/dL (13.3 mmol/L) or higher, check your urine ketones every 4 hours. If you have moderate or large ketones, call your health care provider.  Check your blood glucose every day, and as often as told by your health care provider. ? If your blood glucose is high, drink plenty of fluids.  This helps to flush out ketones. ? If your blood glucose is above your target for 2 tests in a row, contact your health care provider.   General instructions  Carry a medical alert card or wear medical alert jewelry that shows that you have diabetes.  Exercise only as told by your health care provider. Do not exercise when your blood glucose is high and you have ketones in your urine.  If you get sick, call your health care provider and begin treatment quickly. Your body often needs extra insulin to fight an illness. Check your blood glucose every 4 hours when you are sick.  Keep all follow-up visits as told by your health care provider. This is important. Where to find more information  American Diabetes Association: diabetes.org Contact a health care provider if:  Your blood glucose level is higher than 240 mg/dL (13.3 mmol/L) for 2 days in a row.  You have moderate or large ketones in your urine.  You have a fever.  You cannot eat or drink without vomiting.  You have been vomiting for more than 2 hours.  You continue to have symptoms of diabetic ketoacidosis.  You develop new symptoms. Get help right away if:  Your blood glucose monitor reads high even when you are taking insulin.  You faint.  You have chest pain.  You have trouble breathing.  You have sudden trouble speaking or swallowing.  You have vomiting or diarrhea that gets worse after 3 hours.  You are unable to stay awake.  You have trouble thinking.  You are severely dehydrated. Symptoms of severe dehydration include: ? Extreme thirst. ? Dry mouth. ? Rapid breathing. These symptoms may represent a serious problem that is an emergency. Do not wait to see if the symptoms will go away. Get medical help right away. Call your local emergency services (911 in the [place] Do not drive yourself to the hospital. Summary  Diabetic ketoacidosis is a serious complication of diabetes. This condition develops when  there is not enough insulin in the body.  This condition is diagnosed based on your medical history, a physical exam, and blood tests. You may also have a urine test to check for ketones.  Diabetic ketoacidosis is a serious medical condition. You may need emergency treatment in the hospital to monitor your condition.  Contact your health care provider if your blood glucose is higher than 240 mg/dL for 2 days in a row or if you have moderate or large ketones in your urine. This information is not intended to replace advice given to you by your health care provider. Make sure you discuss any questions you have with your health care provider. Document Revised: 11/10/2019 Document Reviewed: 11/10/2019 Elsevier Patient Education  [place].   =======

## 2021-01-25 NOTE — Consult Note (Signed)
CARDIOLOGY CONSULT NOTE  Patient ID: Jose Robinson MRN: [id number] DOB/AGE: [birth year] 63 y.o.  Admit date: 01/23/2021 Attending physician: Candee Furbish, MD Primary Physician:  Kathyrn Lass Outpatient Cardiologist: Dr. Merrilyn Puma, as per Care Everywhere Inpatient Cardiologist: Rex Kras, DO, [facility]  Referring physician: Dr. Tacy Learn Reason for consultation: Abnormal echocardiogram  Chief complaint: Generalized weakness, shortness of breath, coughing  HPI:  Jose Robinson is a 65 y.o. African-American male with a complex past medical history presents with a chief complaint of " weakness, shortness of breath, coughing." His past medical history and cardiovascular risk factors include: End-stage renal disease on hemodialysis TTS, peripheral artery disease status post right BKA, chronic systolic heart failure, COPD, insulin-dependent diabetes mellitus, active tobacco use, history of hypotension postdialysis.  Patient has been getting majority of his care at outside institution such as [facility].  Patient states that he recently relocated from [place] in [place] to [place] as of January 2022.  Patient states that he went to his dialysis session and after coming back he started feeling tired, lightheaded, dizzy.  He presented to [facility] emergency room department for further evaluation and management.  As per EMR patient had a syncopal event while waiting in ER lobby.  He was able to respond to painful stimuli and loud voice and did not need to be resuscitated.  He was found to be hypotensive and started on IV fluids.  Initial blood work noted electrolyte abnormalities, hyperglycemia with serum glucose of 326, serum creatinine 6.4, transaminitis, high sensitive troponin 59, lactic acid 3.2, Covid test positive for PCR.  EKG noted normal sinus rhythm with ST-T changes in inferior and lateral leads and no prior EKGs available for comparison.  Patient is currently being treated for COVID  pneumonia, diabetic ketoacidosis, and hypotension.  Echocardiogram was obtained during his hospitalization which noted mildly reduced left ventricular systolic function with regional wall motion abnormalities.  In the setting of flat troponins, mild reduced LVEF, with regional wall motion abnormalities cardiology consultation was requested during this hospitalization.  At the time of evaluation patient is sitting upright currently having his dialysis session.  He denies any active chest pain and no anginal symptoms prior to admission.  Based on electronic medical records patient has a history of chronic systolic heart failure with EF being moderately/severely reduced as per echo reports available in [place] and patient confirms a history of congestive heart failure.  Patient denies any prior history of left heart catheterization or prior coronary intervention.  I was unable to obtain any prior cardiac catheterization reports through care everywhere.  However, based on the records available in [place] he does have a history of CAD with prior stents no additional information.  Patient denies orthopnea, paroxysmal nocturnal dyspnea or lower extremity swelling.  Troponins essentially flat suggestive of underlying supply demand ischemia and also elevated due to end-stage renal disease on hemodialysis.  He continues to have a nonproductive cough currently isolated for COVID-19.  Patient states that he has been vaccinated as well as his booster.  He continues to smoke half a pack per day despite given his chronic comorbid conditions as noted above including COPD.  At home patient states that he takes aspirin, metoprolol, and lisinopril.  I do not have a list of medications to verify his home medications accurately.  ALLERGIES: Allergies  Allergen Reactions  . Acetaminophen Palpitations  . Prednisone Other (See Comments)    Severe hallucinations/paranoid delusions after steroid  premedications for contrast allergy,  required IV haldol and restraints. Severe hallucinations/paranoid delusions after steroid premedications for contrast allergy, required IV haldol and restraints.   [name] [Iodinated Diagnostic Agents]     PAST MEDICAL HISTORY: Past Medical History:  Diagnosis Date  . Diabetes mellitus without complication ([place])   . ESRD on hemodialysis ([place])   . Hypertension   Chronic heart failure with reduced EF. Nicotine dependence. History of hypotension status post dialysis. Hyperlipidemia. COPD. Chronic pancreatitis. GERD  PAST SURGICAL HISTORY: Past Surgical History:  Procedure Laterality Date  . BACK SURGERY    . BELOW KNEE LEG AMPUTATION      FAMILY HISTORY: Denies family history of premature CAD.  SOCIAL HISTORY:  The patient  reports that he has been smoking. He has never used smokeless tobacco. He reports previous alcohol use. He reports previous drug use.  MEDICATIONS: Current Outpatient Medications  Medication Instructions  . calcium acetate (PHOSLO) 1,334 mg, Oral, 3 times daily with meals  . donepezil (ARICEPT) 10 mg, Oral, Daily  . gabapentin (NEURONTIN) 100 mg, Oral, 3 times daily  . glucagon 1 mg, Intramuscular, Daily PRN  . midodrine (PROAMATINE) 5 mg, Oral, Every T-Th-Sa (1800)  . multivitamin (RENA-VIT) TABS tablet 1 tablet, Oral, Daily  . NovoLOG FlexPen 10 Units, Subcutaneous, As needed  . nystatin ointment (MYCOSTATIN) 1 application, Topical, 2 times daily  . omeprazole (PRILOSEC) 20 mg, Oral, 2 times daily  . ondansetron (ZOFRAN) 4 mg, Oral, Every 8 hours PRN  . Pancrelipase, Lip-Prot-Amyl, 5000-24000 units CPEP 1 capsule, Oral, 3 times daily  . rosuvastatin (CRESTOR) 10 mg, Oral, Daily at bedtime  . sevelamer carbonate (RENVELA) 800 mg, Oral, 3 times daily    REVIEW OF SYSTEMS: Review of Systems  Constitutional: Negative for chills and fever.  HENT: Positive for congestion. Negative for hoarse voice and  nosebleeds.   Eyes: Negative for discharge, double vision and pain.  Cardiovascular: Negative for chest pain, claudication, dyspnea on exertion, leg swelling, near-syncope, orthopnea, palpitations, paroxysmal nocturnal dyspnea and syncope.  Respiratory: Positive for cough and shortness of breath (chronic and stable). Negative for hemoptysis.   Musculoskeletal: Negative for muscle cramps and myalgias.  Gastrointestinal: Negative for abdominal pain, constipation, diarrhea, hematemesis, hematochezia, melena, nausea and vomiting.  Neurological: Positive for dizziness (prior to admission) and light-headedness (prior to admission).  All other systems reviewed and are negative.   PHYSICAL EXAM: Vitals with BMI 01/25/2021 01/25/2021 01/25/2021  Height - - -  Weight - - -  BMI - - -  Systolic [id number]  Diastolic 70 64 77  Pulse - - -     Intake/Output Summary (Last 24 hours) at 01/25/2021 1807 Last data filed at 01/25/2021 1650 Gross per 24 hour  Intake 3127.4 ml  Output 2000 ml  Net 1127.4 ml    Net IO Since Admission: 1,748.53 mL [01/25/21 1807]  CONSTITUTIONAL: Appears older than stated age, well-developed and well-nourished.  Hemodynamically stable.  No acute distress.  SKIN: Skin is warm and dry. No rash noted. No cyanosis. No pallor. No jaundice HEAD: Normocephalic and atraumatic.  EYES: No scleral icterus.  Arcus senilis MOUTH/THROAT: Moist oral membranes.  NECK: No JVD present. No thyromegaly noted. No carotid bruits  LYMPHATIC: No visible cervical adenopathy.  CHEST Normal respiratory effort. No intercostal retractions  LUNGS: Decreased breath sounds bilaterally.  Rhonchi noted.  No stridor. No wheezes. No rales.  CARDIOVASCULAR: Regular, positive S1-S2, no murmurs rubs or gallops appreciated ABDOMINAL: Soft, nontender, nondistended, positive bowel sounds all 4 quadrants.  No apparent ascites.  EXTREMITIES: No peripheral edema.  Right BKA.  Bilateral fifth digit amputated.   Dorsalis pedis and posterior tibial pulses in the left lower extremity not well appreciated.  Warm to touch bilaterally. HEMATOLOGIC: No significant bruising NEUROLOGIC: Oriented to person, place, and time. Nonfocal. Normal muscle tone.  PSYCHIATRIC: Normal mood and affect. Normal behavior. Cooperative  RADIOLOGY: ECHOCARDIOGRAM LIMITED  Result Date: 01/24/2021    ECHOCARDIOGRAM LIMITED REPORT   Patient Name:   AMATO PIEHL Date of Exam: 01/24/2021 Medical Rec #:  [id number]      Height: Accession #:    [id number]     Weight:       190.0 lb Date of Birth:  1956/11/15     BSA:          1.951 m Patient Age:    57 years       BP:           130/57 mmHg Patient Gender: M              HR:           93 bpm. Exam Location:  Inpatient Procedure: Limited Echo, Cardiac Doppler and Color Doppler STAT ECHO Indications:    Abnormal ECG R94.31  History:        Patient has no prior history of Echocardiogram examinations.                 Risk Factors:Hypertension, Diabetes and Current Smoker.  Sonographer:    Vickie Epley RDCS Referring Phys: [id number] [facility]  Sonographer Comments: Covid positive. IMPRESSIONS  1. Abnormal septal motion and posterior lateral hypokinesis . Left ventricular ejection fraction, by estimation, is 45 to 50%. The left ventricle has mildly decreased function. The left ventricle demonstrates regional wall motion abnormalities (see scoring diagram/findings for description).  2. Right ventricular systolic function is normal. The right ventricular size is normal.  3. The mitral valve is normal in structure. No evidence of mitral valve regurgitation. No evidence of mitral stenosis.  4. The aortic valve is normal in structure. Aortic valve regurgitation is not visualized. No aortic stenosis is present.  5. The inferior vena cava is normal in size with greater than 50% respiratory variability, suggesting right atrial pressure of 3 mmHg. FINDINGS  Left Ventricle: Abnormal septal motion and posterior  lateral hypokinesis. Left ventricular ejection fraction, by estimation, is 45 to 50%. The left ventricle has mildly decreased function. The left ventricle demonstrates regional wall motion abnormalities. The left ventricular internal cavity size was normal in size. There is no left ventricular hypertrophy. Right Ventricle: The right ventricular size is normal. No increase in right ventricular wall thickness. Right ventricular systolic function is normal. Left Atrium: Left atrial size was normal in size. Right Atrium: Right atrial size was normal in size. Pericardium: There is no evidence of pericardial effusion. Mitral Valve: The mitral valve is normal in structure. No evidence of mitral valve stenosis. Tricuspid Valve: The tricuspid valve is normal in structure. Tricuspid valve regurgitation is not demonstrated. No evidence of tricuspid stenosis. Aortic Valve: The aortic valve is normal in structure. Aortic valve regurgitation is not visualized. No aortic stenosis is present. Pulmonic Valve: The pulmonic valve was normal in structure. Pulmonic valve regurgitation is not visualized. No evidence of pulmonic stenosis. Aorta: The aortic root is normal in size and structure. Venous: The inferior vena cava is normal in size with greater than 50% respiratory variability, suggesting right atrial pressure of 3 mmHg. IAS/Shunts: The interatrial  septum was not well visualized. LEFT VENTRICLE PLAX 2D LVIDd:         3.90 cm      Diastology LVIDs:         3.30 cm      LV e' medial:    4.68 cm/s LV PW:         0.80 cm      LV E/e' medial:  13.5 LV IVS:        0.80 cm      LV e' lateral:   5.44 cm/s LVOT diam:     2.20 cm      LV E/e' lateral: 11.6 LV SV:         47 LV SV Index:   24 LVOT Area:     3.80 cm  LV Volumes (MOD) LV vol d, MOD A2C: 122.0 ml LV vol d, MOD A4C: 100.0 ml LV vol s, MOD A2C: 76.2 ml LV vol s, MOD A4C: 64.8 ml LV SV MOD A2C:     45.8 ml LV SV MOD A4C:     100.0 ml LV SV MOD BP:      39.6 ml RIGHT VENTRICLE  RV S prime:     8.05 cm/s TAPSE (M-mode): 1.4 cm LEFT ATRIUM         Index LA diam:    2.40 cm 1.23 cm/m  AORTIC VALVE LVOT Vmax:   77.00 cm/s LVOT Vmean:  49.200 cm/s LVOT VTI:    0.123 m  AORTA Ao Root diam: 3.30 cm MITRAL VALVE MV Area (PHT): 3.65 cm    SHUNTS MV Decel Time: 208 msec    Systemic VTI:  0.12 m MV E velocity: 63.00 cm/s  Systemic Diam: 2.20 cm MV A velocity: 84.80 cm/s MV E/A ratio:  0.74 Jenkins Rouge MD Electronically signed by Jenkins Rouge MD Signature Date/Time: 01/24/2021/8:04:00 AM    Final     LABORATORY DATA: Lab Results  Component Value Date   WBC 13.8 (H) 01/25/2021   HGB 12.1 (L) 01/25/2021   HCT 35.9 (L) 01/25/2021   MCV 89.5 01/25/2021   PLT 146 (L) 01/25/2021    Recent Labs  Lab 01/24/21 0644 01/24/21 1338 01/25/21 0723  NA 135   < > 136  K 3.7   < > 3.9  CL 98   < > 102  CO2 22   < > 21*  BUN 24*   < > 37*  CREATININE 6.77*   < > 7.71*  CALCIUM 7.6*   < > 7.2*  PROT 6.2*  --   --   BILITOT 0.8  --   --   ALKPHOS 111  --   --   ALT 55*  --   --   AST 43*  --   --   GLUCOSE 154*   < > 155*   < > = values in this interval not displayed.    Lipid Panel  No results found for: CHOL, TRIG, HDL, CHOLHDL, VLDL, LDLCALC  BNP (last 3 results) No results for input(s): BNP in the last 8760 hours.  HEMOGLOBIN A1C Lab Results  Component Value Date   HGBA1C 10.4 (H) 01/24/2021   MPG 251.78 01/24/2021    Cardiac Panel (last 3 results) Recent Labs    01/24/21 0155 01/24/21 1338 01/24/21 2328  TROPONINIHS 63* 73* 69*     TSH No results for input(s): TSH in the last 8760 hours.    CARDIAC DATABASE: EKG: 01/23/2021 1805: Sinus tachycardia, 109  bpm, left axis deviation, poor R wave progression, left anterior fascicular block, ST-T changes in inferolateral leads cannot exclude ischemia. 01/24/2021 651: Sinus rhythm, 99 bpm, left axis deviation, left anterior fascicular block, intraventricular conduction delay, T wave inversions inferolateral  leads.  Echocardiogram: 01/16/2017 [employer]:  LVEF30% and 3D 35% with RWMA  LEFT VENTRICLE Anterior: HYPOCONTRACTILE  Lateral: HYPOCONTRACTILE  Septal: AKINETIC  Apical: HYPOCONTRACTILE  Inferior: AKINETIC  Posterior: HYPOCONTRACTILE  LVH: MODERATE LVH CONCENTRIC        Closest EF: 30% (Estimated) Calc.EF: 35% (3D)    LV GLS(LOL): -7.2%  Dias.FxClass: RELAXATION ABNORMALITY (GRADE 1) CORRESPONDS TO E/A REVERSAL   [employer] 09/09/2018 (Care Everywhere):  Normal LV systolic function with mild inferior and inferolateral hypokinesis.  Normal RV systolic function  Trivial MR and TR  Compared to prior study from 01/16/2017, LV function has improved.   [facility] 01/25/2021:  1. Abnormal septal motion and posterior lateral hypokinesis . Left  ventricular ejection fraction, by estimation, is 45 to 50%. The left  ventricle has mildly decreased function. The left ventricle demonstrates  regional wall motion abnormalities (see  scoring diagram/findings for description).  2. Right ventricular systolic function is normal. The right ventricular  size is normal.  3. The mitral valve is normal in structure. No evidence of mitral valve  regurgitation. No evidence of mitral stenosis.  4. The aortic valve is normal in structure. Aortic valve regurgitation is  not visualized. No aortic stenosis is present.  5. The inferior vena cava is normal in size with greater than 50%  respiratory variability, suggesting right atrial pressure of 3 mmHg.    IMPRESSION & RECOMMENDATIONS: Jose Robinson is a 65 y.o. 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  Impression: Chronic heart failure with reduced  EF Abnormal EKG Elevated troponin most likely secondary to supply demand ischemia in the setting of ESRD, chronic HFrEF PAD status post right BKA COVID-19 pneumonia Diabetic ketoacidosis, poorly controlled diabetes mellitus type 2 Active tobacco use. Hyperlipidemia  Recommendations: Patient is currently being treated for diabetic ketoacidosis as well as COVID-19 pneumonia.  Cardiology was consulted to provide recommendations given the echocardiogram and EKG findings as well as troponin levels.  Patient does not have any active chest pain.  Patient's troponin levels are relatively flat not suggestive of ACS.  His LVEF is mildly reduced with regional wall motion abnormalities.  The abnormal septal motion is most likely secondary to intraventricular conduction delay seen on EKG.  And the posterior lateral hypokinesis has been present on prior echo reports which are available in Care Everywhere and summarized findings noted above for further reference.  At this point would recommend treating his underlying COVID-19 pneumonia and diabetic ketoacidosis as per primary team.    No additional cardiovascular testing needed at this time as the patient is asymptomatic.  Would recommend reinitiating of his home medications for his HFrEF / PAD / Hyperlipidemia, will defer to primary team.   However, if there is change in clinical status during this hospitalization may consider additional testing prior to discharge otherwise would recommend outpatient follow-up with either myself or his established cardiologist.  Patient agreeable with the plan of care as discussed above.  His questions and concerns were addressed to satisfaction.  Recommendations conveyed to Dr. Tacy Learn.  Please feel free  to reach out if any questions or concerns arise.  Total encounter time 88 minutes. *Total Encounter Time as defined by the Centers for Medicare and Medicaid Services includes, in addition to the face-to-face time of a patient  visit (documented in the note above) non-face-to-face time: obtaining and reviewing outside history, ordering and reviewing medications, tests or procedures, care coordination (communications with other health care professionals or caregivers) and documentation in the medical record.  Patient's questions and concerns were addressed to his satisfaction. He voices understanding of the instructions provided during this encounter.   This note was created using a voice recognition software as a result there may be grammatical errors inadvertently enclosed that do not reflect the nature of this encounter. Every attempt is made to correct such errors.  Mechele Claude [facility]  Pager: [phone] Office: [phone] 01/25/2021, 6:07 PM

## 2021-01-25 NOTE — Progress Notes (Signed)
ANTICOAGULATION CONSULT NOTE - Follow Up Consult  Pharmacy Consult for Heparin Indication: chest pain/ACS  Allergies  Allergen Reactions  . Acetaminophen Palpitations  . Prednisone Other (See Comments)    Severe hallucinations/paranoid delusions after steroid premedications for contrast allergy, required IV haldol and restraints. Severe hallucinations/paranoid delusions after steroid premedications for contrast allergy, required IV haldol and restraints.   [name] [Iodinated Diagnostic Agents]     Patient Measurements: Height: '5\' 11"'$  (180.3 cm) Weight: 86.2 kg (190 lb) IBW/kg (Calculated) : 75.3 Heparin Dosing Weight: 86.2 kg  Vital Signs: Temp: 98.6 F (37 C) (01/27 0336) Temp Source: Oral (01/27 0336) BP: 82/61 (01/27 0400) Pulse Rate: 89 (01/27 0700)  Labs: Recent Labs    01/23/21 1438 01/23/21 1733 01/23/21 2058 01/24/21 0155 01/24/21 0537 01/24/21 0644 01/24/21 1338 01/24/21 1339 01/24/21 1801 01/24/21 2027 01/24/21 2328 01/25/21 0647  HGB 15.2  --  16.0  16.3  --  13.9  --   --   --   --   --   --  12.1*  HCT 45.6  --  47.0  48.0  --  41.0  --   --   --   --   --   --  35.9*  PLT 132*  --   --   --   --   --   --   --   --   --   --  146*  HEPARINUNFRC  --   --   --   --   --   --   --  0.43  --  0.11*  --  0.17*  CREATININE 6.48*  --  5.90* 6.55*  --    < > 6.92*  --  7.15*  --  7.43*  --   TROPONINIHS  --    < >  --  63*  --   --  73*  --   --   --  69*  --    < > = values in this interval not displayed.    Estimated Creatinine Clearance: 11 mL/min (A) (by C-G formula based on SCr of 7.43 mg/dL (H)).  Assessment: Patient is a 54 yom admitted for hypotension, septic vs cardiogenic shock.  Pharmacy has been consulted to dose heparin for ACS.  Heparin level sub-therapeutic. Decrease in Hgb however pt is ESRD. Platelets stable. RN noted no new bleeding.   Goal of Therapy:  Heparin level 0.3-0.7 units/ml Monitor platelets by anticoagulation  protocol: Yes   Plan:  Heparin 2000 units IV bolus, then Increase heparin gtt to 1450 units/hr Check 8 hr heparin level  Mickeal Skinner, PharmD Student 01/25/2021,7:24 AM

## 2021-01-26 DIAGNOSIS — I5021 Acute systolic (congestive) heart failure: Secondary | ICD-10-CM

## 2021-01-26 LAB — CBC WITH DIFFERENTIAL/PLATELET
Abs Immature Granulocytes: 0.06 10*3/uL (ref 0.00–0.07)
Basophils Absolute: 0 10*3/uL (ref 0.0–0.1)
Basophils Relative: 0 %
Eosinophils Absolute: 0 10*3/uL (ref 0.0–0.5)
Eosinophils Relative: 0 %
HCT: 32.7 % — ABNORMAL LOW (ref 39.0–52.0)
Hemoglobin: 11.1 g/dL — ABNORMAL LOW (ref 13.0–17.0)
Immature Granulocytes: 1 %
Lymphocytes Relative: 9 %
Lymphs Abs: 0.9 10*3/uL (ref 0.7–4.0)
MCH: 30.3 pg (ref 26.0–34.0)
MCHC: 33.9 g/dL (ref 30.0–36.0)
MCV: 89.3 fL (ref 80.0–100.0)
Monocytes Absolute: 0.5 10*3/uL (ref 0.1–1.0)
Monocytes Relative: 6 %
Neutro Abs: 7.8 10*3/uL — ABNORMAL HIGH (ref 1.7–7.7)
Neutrophils Relative %: 84 %
Platelets: 131 10*3/uL — ABNORMAL LOW (ref 150–400)
RBC: 3.66 MIL/uL — ABNORMAL LOW (ref 4.22–5.81)
RDW: 14 % (ref 11.5–15.5)
WBC: 9.3 10*3/uL (ref 4.0–10.5)
nRBC: 0 % (ref 0.0–0.2)

## 2021-01-26 LAB — GLUCOSE, CAPILLARY
Glucose-Capillary: 69 mg/dL — ABNORMAL LOW (ref 70–99)
Glucose-Capillary: 72 mg/dL (ref 70–99)
Glucose-Capillary: 98 mg/dL (ref 70–99)
Glucose-Capillary: 155 mg/dL — ABNORMAL HIGH (ref 70–99)
Glucose-Capillary: 140 mg/dL — ABNORMAL HIGH (ref 70–99)

## 2021-01-26 MED ORDER — INSULIN GLARGINE 100 UNIT/ML ~~LOC~~ SOLN
10.0000 [IU] | Freq: Every day | SUBCUTANEOUS | Status: DC
  Administered 2021-01-26 – 2021-01-30 (×5): 10 [IU] via SUBCUTANEOUS
  Filled 2021-01-26 (×6): qty 0.1

## 2021-01-26 MED ORDER — NOREPINEPHRINE 4 MG/250ML-% IV SOLN
0.0000 ug/min | INTRAVENOUS | Status: DC
  Administered 2021-01-26: 2 ug/min via INTRAVENOUS

## 2021-01-26 MED ORDER — INSULIN ASPART 100 UNIT/ML ~~LOC~~ SOLN
0.0000 [IU] | Freq: Three times a day (TID) | SUBCUTANEOUS | Status: DC
  Administered 2021-01-26: 2 [IU] via SUBCUTANEOUS
  Administered 2021-01-27 – 2021-01-28 (×2): 1 [IU] via SUBCUTANEOUS
  Administered 2021-01-28: 2 [IU] via SUBCUTANEOUS
  Administered 2021-01-29: 3 [IU] via SUBCUTANEOUS
  Administered 2021-01-29 – 2021-01-30 (×2): 2 [IU] via SUBCUTANEOUS
  Administered 2021-01-31: 5 [IU] via SUBCUTANEOUS
  Administered 2021-01-31: 3 [IU] via SUBCUTANEOUS
  Administered 2021-01-31 – 2021-02-01 (×2): 2 [IU] via SUBCUTANEOUS
  Administered 2021-02-01: 3 [IU] via SUBCUTANEOUS
  Administered 2021-02-02: 5 [IU] via SUBCUTANEOUS
  Administered 2021-02-03: 2 [IU] via SUBCUTANEOUS
  Administered 2021-02-03: 5 [IU] via SUBCUTANEOUS
  Administered 2021-02-04: 1 [IU] via SUBCUTANEOUS
  Administered 2021-02-04: 2 [IU] via SUBCUTANEOUS
  Administered 2021-02-04: 5 [IU] via SUBCUTANEOUS
  Administered 2021-02-05: 3 [IU] via SUBCUTANEOUS
  Administered 2021-02-05: 1 [IU] via SUBCUTANEOUS
  Administered 2021-02-06: 2 [IU] via SUBCUTANEOUS
  Administered 2021-02-06: 1 [IU] via SUBCUTANEOUS
  Administered 2021-02-06 – 2021-02-07 (×2): 2 [IU] via SUBCUTANEOUS
  Administered 2021-02-08: 3 [IU] via SUBCUTANEOUS
  Administered 2021-02-09: 2 [IU] via SUBCUTANEOUS

## 2021-01-26 MED ORDER — MELATONIN 3 MG PO TABS: 3.0000 mg | ORAL_TABLET | Freq: Once | ORAL | Status: DC

## 2021-01-26 MED ORDER — SODIUM CHLORIDE 0.9 % IV SOLN
100.0000 mg | Freq: Every day | INTRAVENOUS | Status: AC
  Administered 2021-01-26 – 2021-01-29 (×4): 100 mg via INTRAVENOUS
  Filled 2021-01-26 (×4): qty 20

## 2021-01-26 MED ORDER — MIDODRINE HCL 5 MG PO TABS
10.0000 mg | ORAL_TABLET | Freq: Three times a day (TID) | ORAL | Status: DC
  Administered 2021-01-26 – 2021-01-29 (×12): 10 mg via ORAL
  Filled 2021-01-26 (×11): qty 2

## 2021-01-26 MED ORDER — INSULIN ASPART 100 UNIT/ML ~~LOC~~ SOLN
0.0000 [IU] | Freq: Every day | SUBCUTANEOUS | Status: DC
  Administered 2021-01-27 – 2021-02-01 (×2): 2 [IU] via SUBCUTANEOUS
  Administered 2021-02-03: 5 [IU] via SUBCUTANEOUS

## 2021-01-26 NOTE — Progress Notes (Signed)
Inpatient Diabetes Program Recommendations  AACE/ADA: New Consensus Statement on Inpatient Glycemic Control (2015)  Target Ranges:  Prepandial:   less than 140 mg/dL      Peak postprandial:   less than 180 mg/dL (1-2 hours)      Critically ill patients:  140 - 180 mg/dL   Lab Results  Component Value Date   GLUCAP 72 01/26/2021   HGBA1C 10.4 (H) 01/24/2021    Review of Glycemic Control Results for GREG, NORENA (MRN [id number]) as of 01/26/2021 09:31  Ref. Range 01/25/2021 14:51 01/25/2021 17:13 01/25/2021 20:55 01/26/2021 07:27 01/26/2021 07:31  Glucose-Capillary Latest Ref Range: 70 - 99 mg/dL 169 (H) 100 (H) 117 (H) 69 (L) 72  Diabetes history: DM 2/ESRD Outpatient Diabetes medications: Novolog flexpen- unsure of dose Current orders for Inpatient glycemic control:  Lantus 15 units daily  Novolog moderate tid with meals Inpatient Diabetes Program Recommendations:    Please consider reducing Lantus to 10 units daily.  Also, please reduce Novolog correction to sensitive tid with meals.   Thanks,  Adah Perl, RN, BC-ADM Inpatient Diabetes Coordinator Pager [phone] (8a-5p)

## 2021-01-26 NOTE — Progress Notes (Signed)
PROGRESS NOTE    Jose Robinson  [id number] DOB: 1956-05-02 DOA: 01/23/2021 PCP: Pcp, No    Brief Narrative:  65yo with hx ESRD on TTS HD, CAD, PAD, chronic systolic chf, copd, DM who presented with generalized weakness, found to have glucose just under 500. While in ED, became unresponsive and hypotensive, requiring IVF resuscitation. Tested covid pos. CCM consulted with pt later requiring pressor support. Now off pressors and transferred to [facility]:   Principal Problem:   DKA (diabetic ketoacidosis) ([place]) Active Problems:   COVID-19 virus infection   Syncope   ESRD (end stage renal disease) ([place])   Congestive heart failure (CHF) (H[facility])   Hypotension   Shock ([place])  Hyperglycemia, possible early DKA Insulin-dependent diabetes mellitus Suspected noncompliance.   -DKA resolved and pt has been now continued on subq insulin -Glucose trends are stable  COVID-19 viral infection: SARS-CoV-2 PCR test positive.   -Pt reported being fully vaccinated including booster -Presented tachypnea with cxr findings of PNA -CRP noted to be over 13.3 -Received one dose of remdesivir but was d/c by CCM. Given pulmonary symptoms, would recommend continuing remdesivir to complete total 5 day course -follow inflammatory marker trends  Syncope:  -Suspect secondary to hypotension -resolved  Lactic acidosis:  -Presenting lactic acid 3.2.  No fever or leukocytosis.  He is not endorsing any symptoms to suggest underlying infection. -Patient was volume resuscitated   Elevated troponin in the setting of CAD:  -High-sensitivity troponin 59.   -seen by Cardiology. No further work up recommended at this time. Recs to cont to treat underlying covid and diabetes  Slightly elevated LFTs: Likely due to COVID-19 viral infection.  -LFT trended down -Continue to monitor LFTs  ESRD on HD TTS: Chest x-ray with question of pneumonia versus pulmonary edema.   -Nephrology  following -Plan to continue HD as tolerated  Chronic systolic heart failure -Volume management with dialysis  COPD: Stable.  No wheezing. -Combivent inhaler as needed.    DVT prophylaxis: Heparin subq Code Status: Full Family Communication: Pt in room, family not at bedside  Status is: Inpatient  Remains inpatient appropriate because:Unsafe d/c plan and Inpatient level of care appropriate due to severity of illness   Dispo: The patient is from: Home              Anticipated d/c is to: Home              Anticipated d/c date is: 2 days              Patient currently is not medically stable to d/c.   Difficult to place patient No   Consultants:   Nephrology  Cardiology  PCCM  Procedures:     Antimicrobials: Anti-infectives (From admission, onward)   Start     Dose/Rate Route Frequency Ordered Stop   01/26/21 1000  remdesivir 100 mg in sodium chloride 0.9 % 100 mL IVPB        100 mg 200 mL/hr over 30 Minutes Intravenous Daily 01/26/21 0833 01/30/21 0959   01/24/21 1600  remdesivir 100 mg in sodium chloride 0.9 % 100 mL IVPB  Status:  Discontinued       "Followed by" Linked Group Details   100 mg 200 mL/hr over 30 Minutes Intravenous Daily 01/24/21 0012 01/24/21 0840   01/24/21 1400  piperacillin-tazobactam (ZOSYN) IVPB 2.25 g  Status:  Discontinued        2.25 g 100 mL/hr over 30 Minutes Intravenous Every 8  hours 01/24/21 0613 01/25/21 1140   01/24/21 0613  vancomycin variable dose per unstable renal function (pharmacist dosing)  Status:  Discontinued         Does not apply See admin instructions 01/24/21 0613 01/25/21 1140   01/24/21 0545  vancomycin (VANCOREADY) IVPB 1750 mg/350 mL        1,750 mg 175 mL/hr over 120 Minutes Intravenous  Once 01/24/21 0534 01/24/21 1133   01/24/21 0545  piperacillin-tazobactam (ZOSYN) IVPB 3.375 g        3.375 g 100 mL/hr over 30 Minutes Intravenous  Once 01/24/21 0534 01/24/21 0647   01/24/21 0015  remdesivir 200 mg in  sodium chloride 0.9% 250 mL IVPB       "Followed by" Linked Group Details   200 mg 580 mL/hr over 30 Minutes Intravenous Once 01/24/21 0012 01/24/21 0608       Subjective: Eager to go home soon  Objective: Vitals:   01/26/21 1230 01/26/21 1300 01/26/21 1330 01/26/21 1522  BP: (!) 152/84 (!) 142/81 133/73   Pulse: 77 87 85   Resp: (!) 23 (!) 30 14   Temp:    97.6 F (36.4 C)  TempSrc:    Oral  SpO2: 99% 98% 98%   Weight:      Height:        Intake/Output Summary (Last 24 hours) at 01/26/2021 1802 Last data filed at 01/26/2021 1200 Gross per 24 hour  Intake 179.66 ml  Output --  Net 179.66 ml   Filed Weights   01/23/21 2200 01/24/21 1500  Weight: 86.2 kg 86.2 kg    Examination:  General exam: Appears calm and comfortable  Respiratory system: no audible wheezing. Respiratory effort normal. Cardiovascular system: perfused, regular Gastrointestinal system: nondistended, nontender Central nervous system: Alert and oriented. No focal neurological deficits. Extremities: Symmetric 5 x 5 power. Skin: No rashes, lesions Psychiatry: Judgement and insight appear normal. Mood & affect appropriate.   Data Reviewed: I have personally reviewed following labs and imaging studies  CBC: Recent Labs  Lab 01/23/21 1438 01/23/21 2058 01/24/21 0537 01/25/21 0647 01/26/21 0346  WBC 8.1  --   --  13.8* 9.3  NEUTROABS  --   --   --  12.0* 7.8*  HGB 15.2 16.0  16.3 13.9 12.1* 11.1*  HCT 45.6 47.0  48.0 41.0 35.9* 32.7*  MCV 90.7  --   --  89.5 89.3  PLT 132*  --   --  146* [id number]*   Basic Metabolic Panel: Recent Labs  Lab 01/23/21 1733 01/23/21 2058 01/24/21 0644 01/24/21 1338 01/24/21 1801 01/24/21 2328 01/25/21 0723  NA  --    < > 135 133* 134* 133* 136  K  --    < > 3.7 3.9 4.0 4.0 3.9  CL  --    < > 98 98 99 97* 102  CO2  --    < > 22 20* 17* 19* 21*  GLUCOSE  --    < > 154* 108* 161* 153* 155*  BUN  --    < > 24* 28* 32* 34* 37*  CREATININE  --    < > 6.77*  6.92* 7.15* 7.43* 7.71*  CALCIUM  --    < > 7.6* 7.9* 7.6* 7.4* 7.2*  MG 1.8  --   --   --   --   --   --    < > = values in this interval not displayed.   GFR: Estimated Creatinine Clearance: 10.6  mL/min (A) (by C-G formula based on SCr of 7.71 mg/dL (H)). Liver Function Tests: Recent Labs  Lab 01/23/21 1733 01/24/21 0644  AST 70* 43*  ALT 70* 55*  ALKPHOS 131* 111  BILITOT 1.4* 0.8  PROT 6.7 6.2*  ALBUMIN 3.1* 2.7*   No results for input(s): LIPASE, AMYLASE in the last 168 hours. Recent Labs  Lab 01/23/21 1805  AMMONIA 24   Coagulation Profile: No results for input(s): INR, PROTIME in the last 168 hours. Cardiac Enzymes: No results for input(s): CKTOTAL, CKMB, CKMBINDEX, TROPONINI in the last 168 hours. BNP (last 3 results) No results for input(s): PROBNP in the last 8760 hours. HbA1C: Recent Labs    01/24/21 0656  HGBA1C 10.4*   CBG: Recent Labs  Lab 01/25/21 2055 01/26/21 0727 01/26/21 0731 01/26/21 1119 01/26/21 1751  GLUCAP 117* 69* 72 98 155*   Lipid Profile: No results for input(s): CHOL, HDL, LDLCALC, TRIG, CHOLHDL, LDLDIRECT in the last 72 hours. Thyroid Function Tests: No results for input(s): TSH, T4TOTAL, FREET4, T3FREE, THYROIDAB in the last 72 hours. Anemia Panel: Recent Labs    01/24/21 0644  FERRITIN 690*   Sepsis Labs: Recent Labs  Lab 01/23/21 1735 01/24/21 0155 01/24/21 0644  PROCALCITON  --  41.70  --   LATICACIDVEN 3.2*  --  2.8*    Recent Results (from the past 240 hour(s))  SARS Coronavirus 2 by RT PCR (hospital order, performed in [facility] hospital lab) Nasopharyngeal Nasopharyngeal Swab     Status: Abnormal   Collection Time: 01/23/21  5:35 PM   Specimen: Nasopharyngeal Swab  Result Value Ref Range Status   SARS Coronavirus 2 POSITIVE (A) NEGATIVE Final    Comment: RESULT CALLED TO, READ BACK BY AND VERIFIED WITH: RN B ATKINS 2247 [id number] FCP (NOTE) SARS-CoV-2 target nucleic acids are DETECTED  SARS-CoV-2 RNA is  generally detectable in upper respiratory specimens  during the acute phase of infection.  Positive results are indicative  of the presence of the identified virus, but do not rule out bacterial infection or co-infection with other pathogens not detected by the test.  Clinical correlation with patient history and  other diagnostic information is necessary to determine patient infection status.  The expected result is negative.  Fact Sheet for Patients:   StrictlyIdeas.no   Fact Sheet for Healthcare Providers:   BankingDealers.co.za    This test is not yet approved or cleared by the [place] FDA and  has been authorized for detection and/or diagnosis of SARS-CoV-2 by FDA under an Emergency Use Authorization (EUA).  This EUA will remain in effect (meaning this test can  be used) for the duration of  the COVID-19 declaration under Section 564(b)(1) of the Act, 21 U.S.C. section 360-bbb-3(b)(1), unless the authorization is terminated or revoked sooner.  Performed at [facility], [place] [address], [place], [place] [address]   Culture, blood (Routine X 2) w Reflex to ID Panel     Status: None (Preliminary result)   Collection Time: 01/24/21 11:27 PM   Specimen: BLOOD RIGHT HAND  Result Value Ref Range Status   Specimen Description BLOOD RIGHT HAND  Final   Special Requests   Final    AEROBIC BOTTLE ONLY Blood Culture results may not be optimal due to an inadequate volume of blood received in culture bottles   Culture   Final    NO GROWTH 1 DAY Performed at [facility], [place] [address], [place],  [address]  Report Status PENDING  Incomplete  Culture, blood (Routine X 2) w Reflex to ID Panel     Status: None (Preliminary result)   Collection Time: 01/24/21 11:35 PM   Specimen: BLOOD RIGHT FOREARM  Result Value Ref Range Status   Specimen Description BLOOD RIGHT FOREARM  Final   Special Requests   Final     AEROBIC BOTTLE ONLY Blood Culture results may not be optimal due to an inadequate volume of blood received in culture bottles   Culture   Final    NO GROWTH 1 DAY Performed at [facility], [place] [address] [place], [place] [address]    Report Status PENDING  Incomplete  MRSA PCR Screening     Status: None   Collection Time: 01/25/21 10:33 AM   Specimen: Nasal Mucosa; Nasopharyngeal  Result Value Ref Range Status   MRSA by PCR NEGATIVE NEGATIVE Final    Comment:        The GeneXpert MRSA Assay (FDA approved for NASAL specimens only), is one component of a comprehensive MRSA colonization surveillance program. It is not intended to diagnose MRSA infection nor to guide or monitor treatment for MRSA infections. Performed at [facility], [place] [address] [place],  [address]      Radiology Studies: No results found.  Scheduled Meds: . vitamin C  500 mg Oral Daily  . aspirin  324 mg Oral Once  . aspirin  81 mg Oral Daily  . atorvastatin  20 mg Oral Daily  . Chlorhexidine Gluconate Cloth  6 each Topical Q0600  . cholecalciferol  1,000 Units Oral Daily  . gabapentin  100 mg Oral TID  . heparin injection (subcutaneous)  5,000 Units Subcutaneous Q8H  . heparin sodium (porcine)  4,800 Units Intracatheter Once  . insulin aspart  0-5 Units Subcutaneous QHS  . insulin aspart  0-9 Units Subcutaneous TID WC  . insulin glargine  10 Units Subcutaneous Daily  . melatonin  3 mg Oral Once  . midodrine  10 mg Oral TID WC  . pantoprazole  20 mg Oral Daily  . sodium chloride flush  10-40 mL Intracatheter Q12H  . zinc sulfate  220 mg Oral Daily   Continuous Infusions: . norepinephrine (LEVOPHED) Adult infusion 2 mcg/min (01/26/21 0500)  . remdesivir 100 mg in NS 100 mL 100 mg (01/26/21 1116)     LOS: 2 days   Marylu Lund, MD [facility] Pager On Amion  If 7PM-7AM, please contact night-coverage 01/26/2021, 6:02 PM

## 2021-01-26 NOTE — Progress Notes (Signed)
[facility] Progress Note Patient Name: Jose Robinson DOB: 1956-11-22 MRN: [id number]   Date of Service  01/26/2021  HPI/Events of Note  Patient requesting a sleep aid.  eICU Interventions  Melatonin 3 mg  HS x 1 ordered.        [place]wo U Ogan 01/26/2021, 1:25 AM

## 2021-01-26 NOTE — Progress Notes (Signed)
Renal Navigator notified by Medical team today that patient should be ready for discharge home soon. Navigator spoke with patient now that disposition is know. Since patient does not have any natural supports in [place] who can transport him to outpatient HD while in [place] has requested [facility] transportation through [employer]. Navigator is hopeful that this can be confirmed on Monday for discharge and patient's return to HD clinic Tuesday, but this needs to be confirmed for a safe discharge plan. Patient states understanding and appreciation. Dr. Jonnie Finner and Dr. Wyline Copas updated.   Alphonzo Cruise, [place] Renal Navigator [phone]

## 2021-01-26 NOTE — Progress Notes (Signed)
Jose Robinson  Subjective: seen in ICU, no issues, had HD yest w/ 2 L net UF  Vitals:   01/26/21 1000 01/26/21 1100 01/26/21 1120 01/26/21 1200  BP: 138/83 (!) 120/58  139/75  Pulse: 75 93  76  Resp: (!) 0 15  (!) 26  Temp:   98.4 F (36.9 C)   TempSrc:   Oral   SpO2: 100% (!) 86%  98%  Weight:      Height:        Exam: Gen awakens easily, O x3 No jvd or bruits Chest clear bilat to bases RRR no MRG Abd soft ntnd no mass or ascites +bs MS bilat finger amps well healed Ext R BKA, LLE 1+ edema Neuro is alert, Ox 3 , nf  L IJ TDC    Home meds:  - aricept 10/ neurontin 100 tid  - midodrine '5mg'$  pre HD TTW  - renvela 800 ac tid/ phoslo 3 ac tid  - prilosec 20 qd/ crestor 10 hs  - novolog flexpen tid ac  - pancreatic enzymes tid ac  - prn's/ vitamins/ supplements    OP HD: TTS  NW  4h   400/ 800  68.5kg   2/ 3.5Ca  TDC  Hep 4000  -  hect 1ug   - last HD 1/25    CXR 1/25 - IS edema bilat + patchy bilat infiltrates  Assessment/ Plan: 1. Hypotension - septic vs cardiogenic, on IV abx. Weaned off pressors. Going to floor bed.  2. Resp failure - COVID pna and/or bact pna. Hx COPD.  3. NSTEMI - per CCM 4. ESRD - TTS HD. Next HD tomorrow, hopefully he can go to outpatient HD in the afternoon because w/ our high pt census getting him dialyzed tomorrow on schedule is unlikely.  5. DM2/ PAD - on insulin at home. sp R BKA, bilat finger amps. Per pmd 6. 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 Anemia ckd - Hb 13, follow       Jose Robinson 01/26/2021, 1:21 PM   Recent Labs  Lab 01/24/21 2328 01/25/21 0647 01/25/21 0723 01/26/21 0346  K 4.0  --  3.9  --   BUN 34*  --  37*  --   CREATININE 7.43*  --  7.71*  --   CALCIUM 7.4*  --  7.2*  --   HGB  --  12.1*  --  11.1*   Inpatient medications: . vitamin C  500 mg Oral Daily  . aspirin  324 mg Oral Once  . aspirin  81 mg  Oral Daily  . atorvastatin  20 mg Oral Daily  . Chlorhexidine Gluconate Cloth  6 each Topical Q0600  . cholecalciferol  1,000 Units Oral Daily  . gabapentin  100 mg Oral TID  . heparin injection (subcutaneous)  5,000 Units Subcutaneous Q8H  . heparin sodium (porcine)  4,800 Units Intracatheter Once  . insulin aspart  0-5 Units Subcutaneous QHS  . insulin aspart  0-9 Units Subcutaneous TID WC  . insulin glargine  10 Units Subcutaneous Daily  . melatonin  3 mg Oral Once  . midodrine  10 mg Oral TID WC  . pantoprazole  20 mg Oral Daily  . sodium chloride flush  10-40 mL Intracatheter Q12H  . zinc sulfate  220 mg Oral Daily   . norepinephrine (LEVOPHED) Adult infusion 2 mcg/min (01/26/21 0500)  .  remdesivir 100 mg in NS 100 mL 100 mg (01/26/21 1116)   dextrose, guaiFENesin-codeine, Ipratropium-Albuterol, sodium chloride flush

## 2021-01-26 NOTE — Progress Notes (Signed)
eLink Physician-Brief Progress Note Patient Name: Jose Robinson DOB: 1956-06-22 MRN: [id number]   Date of Service  01/26/2021  HPI/Events of Note  Patient with soft blood pressures s/p recent dialysis.  eICU Interventions  Low dose Norepinephrine infusion ordered (patient currently on 2 mcg with BP 112/50, MAP 73 mmHg).        Kerry Kass Jose Robinson 01/26/2021, 3:59 AM

## 2021-01-27 LAB — COMPREHENSIVE METABOLIC PANEL
ALT: 32 U/L (ref 0–44)
AST: 27 U/L (ref 15–41)
Albumin: 2.4 g/dL — ABNORMAL LOW (ref 3.5–5.0)
Alkaline Phosphatase: 89 U/L (ref 38–126)
Anion gap: 12 (ref 5–15)
BUN: 37 mg/dL — ABNORMAL HIGH (ref 8–23)
CO2: 22 mmol/L (ref 22–32)
Calcium: 6.8 mg/dL — ABNORMAL LOW (ref 8.9–10.3)
Chloride: 101 mmol/L (ref 98–111)
Creatinine, Ser: 8.35 mg/dL — ABNORMAL HIGH (ref 0.61–1.24)
GFR, Estimated: 7 mL/min — ABNORMAL LOW (ref >60)
Glucose, Bld: 85 mg/dL (ref 70–99)
Potassium: 3.9 mmol/L (ref 3.5–5.1)
Sodium: 135 mmol/L (ref 135–145)
Total Bilirubin: 0.6 mg/dL (ref 0.3–1.2)
Total Protein: 5.8 g/dL — ABNORMAL LOW (ref 6.5–8.1)

## 2021-01-27 LAB — CBC WITH DIFFERENTIAL/PLATELET
Abs Immature Granulocytes: 0.04 10*3/uL (ref 0.00–0.07)
Basophils Absolute: 0 10*3/uL (ref 0.0–0.1)
Basophils Relative: 0 %
Eosinophils Absolute: 0.1 10*3/uL (ref 0.0–0.5)
Eosinophils Relative: 1 %
HCT: 36.4 % — ABNORMAL LOW (ref 39.0–52.0)
Hemoglobin: 11.7 g/dL — ABNORMAL LOW (ref 13.0–17.0)
Immature Granulocytes: 1 %
Lymphocytes Relative: 15 %
Lymphs Abs: 1 10*3/uL (ref 0.7–4.0)
MCH: 29.4 pg (ref 26.0–34.0)
MCHC: 32.1 g/dL (ref 30.0–36.0)
MCV: 91.5 fL (ref 80.0–100.0)
Monocytes Absolute: 0.5 10*3/uL (ref 0.1–1.0)
Monocytes Relative: 7 %
Neutro Abs: 5.1 10*3/uL (ref 1.7–7.7)
Neutrophils Relative %: 76 %
Platelets: 154 10*3/uL (ref 150–400)
RBC: 3.98 MIL/uL — ABNORMAL LOW (ref 4.22–5.81)
RDW: 14 % (ref 11.5–15.5)
WBC: 6.7 10*3/uL (ref 4.0–10.5)
nRBC: 0 % (ref 0.0–0.2)

## 2021-01-27 LAB — GLUCOSE, CAPILLARY
Glucose-Capillary: 78 mg/dL (ref 70–99)
Glucose-Capillary: 77 mg/dL (ref 70–99)
Glucose-Capillary: 143 mg/dL — ABNORMAL HIGH (ref 70–99)
Glucose-Capillary: 233 mg/dL — ABNORMAL HIGH (ref 70–99)

## 2021-01-27 MED ORDER — HEPARIN SODIUM (PORCINE) 1000 UNIT/ML IJ SOLN
INTRAMUSCULAR | Status: AC
  Administered 2021-01-27: 4800 [IU]
  Filled 2021-01-27: qty 5

## 2021-01-27 MED ORDER — SODIUM CHLORIDE 0.9% FLUSH
10.0000 mL | Freq: Two times a day (BID) | INTRAVENOUS | Status: DC
  Administered 2021-01-27 – 2021-01-28 (×3): 10 mL
  Administered 2021-01-28: 15 mL
  Administered 2021-01-29 – 2021-02-06 (×6): 10 mL

## 2021-01-27 MED ORDER — HEPARIN SODIUM (PORCINE) 1000 UNIT/ML DIALYSIS
2000.0000 [IU] | INTRAMUSCULAR | Status: DC | PRN
  Administered 2021-01-31: 2000 [IU] via INTRAVENOUS_CENTRAL
  Filled 2021-01-27 (×2): qty 2

## 2021-01-27 NOTE — Evaluation (Signed)
Physical Therapy Evaluation Patient Details Name: Jose Robinson MRN: [id number] DOB: [birth year] Today's Date: 01/27/2021   History of Present Illness  65yo with hx ESRD on TTS HD, CAD, PAD, chronic systolic chf, copd, DM, R BKA who presented with generalized weakness, found to have glucose just under 500. While in ED, became unresponsive and hypotensive, requiring IVF resuscitation. Tested covid pos.  Clinical Impression  Pt admitted with above diagnosis. Pt unable to don prosthesis independently at baseline and had great difficulty today but was finally successful. Pt needed min A for sit<>stand  For power up and to give support while he transitioned hand from bed to RW. Pt took pivot steps to chair with min A. Pt with uncontrolled BM sit sit>stand, is unable to clean self but was able to maintain standing x3 mins for bowel clean up. Pt currently with functional limitations due to the deficits listed below (see PT Problem List). Pt will benefit from skilled PT to increase their independence and safety with mobility to allow discharge to the venue listed below.        Follow Up Recommendations Home health PT;Supervision for mobility/OOB    Equipment Recommendations  None recommended by PT    Recommendations for Other Services       Precautions / Restrictions Precautions Precautions: Fall Required Braces or Orthoses: Other Brace Other Brace: R prosthesis Restrictions Weight Bearing Restrictions: No      Mobility  Bed Mobility Overal bed mobility: Needs Assistance Bed Mobility: Rolling;Sidelying to Sit Rolling: Modified independent (Device/Increase time) Sidelying to sit: Min guard       General bed mobility comments: heavy use of rail and increased time needed to come to EOB, min-guard for safety in coming to sit    Transfers Overall transfer level: Needs assistance Equipment used: Rolling walker (2 wheeled) Transfers: Sit to/from Stand Sit to Stand: Min assist          General transfer comment: min A for power up and support as he moved hand from bed to RW. Performed 3x. SPT from bed to recliner with min A  Ambulation/Gait             General Gait Details: did not progress today as pt had difficulty donning prosthesis and was fatigued by the time he was standing, also with BM when standing  Stairs            Wheelchair Mobility    Modified Rankin (Stroke Patients Only)       Balance Overall balance assessment: Needs assistance Sitting-balance support: Single extremity supported;Feet supported Sitting balance-Leahy Scale: Fair     Standing balance support: Bilateral upper extremity supported Standing balance-Leahy Scale: Poor Standing balance comment: heavily reliant on UE support. Maintained standing x2 mins for bowel clean up                             Pertinent Vitals/Pain Pain Assessment: No/denies pain    Home Living Family/patient expects to be discharged to:: Private residence Living Arrangements: Alone Available Help at Discharge: Personal care attendant Type of Home: House Home Access: Level entry     Home Layout: One level Home Equipment: Environmental consultant - 2 wheels;Wheelchair - manual Additional Comments: pt has 24/7 caregivers    Prior Function Level of Independence: Needs assistance   Gait / Transfers Assistance Needed: pt unable to don prosthesis independently, caregivers assist. Reports he can walk 300-400 yards at a time  ADL's /  Homemaking Assistance Needed: assist needed        Hand Dominance        Extremity/Trunk Assessment   Upper Extremity Assessment Upper Extremity Assessment: RUE deficits/detail RUE Deficits / Details: 5th finger amputations both hands, limited strength in hands    Lower Extremity Assessment Lower Extremity Assessment: RLE deficits/detail;LLE deficits/detail RLE Deficits / Details: BKA RLE Sensation: WNL RLE Coordination: WNL LLE Deficits / Details: hip flex  4-/5, knee ext 4-/5 LLE Sensation: WNL LLE Coordination: WNL    Cervical / Trunk Assessment Cervical / Trunk Assessment: Normal  Communication   Communication: No difficulties  Cognition Arousal/Alertness: Awake/alert Behavior During Therapy: WFL for tasks assessed/performed Overall Cognitive Status: Within Functional Limits for tasks assessed                                        General Comments General comments (skin integrity, edema, etc.): VSS on RA. Pt positioned in recliner after session with prosthesis on. Pt reports that he dons prosthesis everyday at home with assistance    Exercises     Assessment/Plan    PT Assessment Patient needs continued PT services  PT Problem List Decreased strength;Decreased activity tolerance;Decreased balance;Decreased mobility       PT Treatment Interventions DME instruction;Gait training;Functional mobility training;Therapeutic activities;Therapeutic exercise;Balance training;Patient/family education    PT Goals (Current goals can be found in the Care Plan section)  Acute Rehab PT Goals Patient Stated Goal: return home PT Goal Formulation: With patient Time For Goal Achievement: 02/10/21 Potential to Achieve Goals: Good    Frequency Min 3X/week   Barriers to discharge        Co-evaluation               AM-PAC PT "6 Clicks" Mobility  Outcome Measure Help needed turning from your back to your side while in a flat bed without using bedrails?: None Help needed moving from lying on your back to sitting on the side of a flat bed without using bedrails?: A Little Help needed moving to and from a bed to a chair (including a wheelchair)?: A Little Help needed standing up from a chair using your arms (e.g., wheelchair or bedside chair)?: A Little Help needed to walk in hospital room?: A Lot Help needed climbing 3-5 steps with a railing? : Total 6 Click Score: 16    End of Session   Activity Tolerance:  Patient tolerated treatment well Patient left: in chair;with call bell/phone within reach Nurse Communication: Mobility status PT Visit Diagnosis: Unsteadiness on feet (R26.81);Muscle weakness (generalized) (M62.81);Difficulty in walking, not elsewhere classified (R26.2)    Time: [id number] PT Time Calculation (min) (ACUTE ONLY): 67 min   Charges:   PT Evaluation $PT Eval Moderate Complexity: 1 Mod PT Treatments $Gait Training: 8-22 mins $Therapeutic Activity: 8-22 mins        Leighton Roach, PT  Acute Rehab Services  Pager [phone] Office [place] 01/27/2021, 5:01 PM

## 2021-01-27 NOTE — Progress Notes (Signed)
[facility] Progress Note  Subjective: seen in ICU, no issues, had HD yest w/ 2 L net UF  Vitals:   01/27/21 1000 01/27/21 1015 01/27/21 1030 01/27/21 1042  BP: 124/63 125/70 127/72 122/63  Pulse:      Resp: '19 20 19 '$ (!) 24  Temp:    98 F (36.7 C)  TempSrc:    Oral  SpO2:   100% 98%  Weight:    72 kg  Height:        Exam: Gen awakens easily, O x3 No jvd or bruits Chest clear bilat to bases RRR no MRG Abd soft ntnd no mass or ascites +bs MS bilat finger amps well healed Ext R BKA, LLE 1+ edema Neuro is alert, Ox 3 , nf  L IJ TDC    Home meds:  - aricept 10/ neurontin 100 tid  - midodrine '5mg'$  pre HD TTW  - renvela 800 ac tid/ phoslo 3 ac tid  - prilosec 20 qd/ crestor 10 hs  - novolog flexpen tid ac  - pancreatic enzymes tid ac  - prn's/ vitamins/ supplements    OP HD: TTS  NW  4h   400/ 800  68.5kg   2/ 3.5Ca  TDC  Hep 4000  -  hect 1ug   - last HD 1/25    CXR 1/25 - IS edema bilat + patchy bilat infiltrates  Assessment/ Plan: 1. DKA - resolved, per pmd 2. COVID 19 pna - CXR w/ infiltrates, on O2 nasal cannula.  3. Syncope - per pmd 4. Hypotension - on IV abx. Weaned off pressors. Resolved.  5. Resp failure - COVID pna and/or bact pna. Hx COPD.  6. NSTEMI - per CCM 7. ESRD - TTS HD. HD today in ICU.  8. DM2/ PAD - on insulin at home. sp R BKA, bilat finger amps. Per pmd 9. BP/ volume - is 3-4kg up post HD this am. Try to get down to dry wt w/ next HD.  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 Anemia ckd - Hb 13, follow 12. Dispo - per pmd     Rob Pendleton 01/27/2021, 12:09 PM   Recent Labs  Lab 01/25/21 0723 01/26/21 0346 01/27/21 0256  K 3.9  --  3.9  BUN 37*  --  37*  CREATININE 7.71*  --  8.35*  CALCIUM 7.2*  --  6.8*  HGB  --  11.1* 11.7*   Inpatient medications: . vitamin C  500 mg Oral Daily  . aspirin  324 mg Oral Once  . aspirin  81 mg Oral Daily  . atorvastatin  20 mg Oral Daily  . Chlorhexidine Gluconate Cloth   6 each Topical Q0600  . cholecalciferol  1,000 Units Oral Daily  . gabapentin  100 mg Oral TID  . heparin injection (subcutaneous)  5,000 Units Subcutaneous Q8H  . insulin aspart  0-5 Units Subcutaneous QHS  . insulin aspart  0-9 Units Subcutaneous TID WC  . insulin glargine  10 Units Subcutaneous Daily  . melatonin  3 mg Oral Once  . midodrine  10 mg Oral TID WC  . pantoprazole  20 mg Oral Daily  . sodium chloride flush  10-40 mL Intracatheter Q12H  . zinc sulfate  220 mg Oral Daily   . norepinephrine (LEVOPHED) Adult infusion 2 mcg/min (01/26/21 0500)  . remdesivir 100 mg in NS 100 mL 100 mg (01/27/21 1134)   dextrose, guaiFENesin-codeine, [START ON 01/28/2021] heparin, Ipratropium-Albuterol, sodium chloride flush

## 2021-01-27 NOTE — Progress Notes (Signed)
PROGRESS NOTE    Jose Robinson  [id number] DOB: May 19, 1956 DOA: 01/23/2021 PCP: Pcp, No    Brief Narrative:  65yo with hx ESRD on TTS HD, CAD, PAD, chronic systolic chf, copd, DM who presented with generalized weakness, found to have glucose just under 500. While in ED, became unresponsive and hypotensive, requiring IVF resuscitation. Tested covid pos. CCM consulted with pt later requiring pressor support. Now off pressors and transferred to [place]:   Principal Problem:   DKA (diabetic ketoacidosis) ([place]) Active Problems:   COVID-19 virus infection   Syncope   ESRD (end stage renal disease) ([place])   Congestive heart failure (CHF) (H[facility])   Hypotension   Shock ([place])  Hyperglycemia, possible early DKA Insulin-dependent diabetes mellitus Suspected noncompliance.   -DKA resolved and pt has been now continued on subq insulin -Glucose trends are stable  COVID-19 viral infection: SARS-CoV-2 PCR test positive.   -Pt reported being fully vaccinated including booster -Presented tachypnea with cxr findings of PNA -CRP noted to be over 13.3 -Received one dose of remdesivir but was d/c by CCM. Given pulmonary symptoms, pt to complete remdesivir to complete total 5 day course -follow inflammatory marker trends  Syncope:  -Suspect secondary to hypotension -resolved  Lactic acidosis:  -Presenting lactic acid 3.2.  No fever or leukocytosis.  He is not endorsing any symptoms to suggest underlying infection. -Patient was volume resuscitated  Elevated troponin in the setting of CAD:  -High-sensitivity troponin 59.   -seen by Cardiology. No further work up recommended at this time. Recommendations noted to cont to treat underlying covid and diabetes  Slightly elevated LFTs: Likely due to COVID-19 viral infection.  -LFT trended down -Continue to monitor LFTs  ESRD on HD TTS: Chest x-ray with question of pneumonia versus pulmonary edema.   -Nephrology  following -Plan to continue HD as tolerated  Chronic systolic heart failure -Volume management with dialysis  COPD: Stable.  No wheezing. -continue with Combivent inhaler as needed.  DVT prophylaxis: Heparin subq Code Status: Full Family Communication: Pt in room, family not at bedside  Status is: Inpatient  Remains inpatient appropriate because:Unsafe d/c plan and Inpatient level of care appropriate due to severity of illness   Dispo: The patient is from: Home              Anticipated d/c is to: Home              Anticipated d/c date is: 2 days              Patient currently is not medically stable to d/c.   Difficult to place patient No   Consultants:   Nephrology  Cardiology  PCCM  Procedures:     Antimicrobials: Anti-infectives (From admission, onward)   Start     Dose/Rate Route Frequency Ordered Stop   01/26/21 1000  remdesivir 100 mg in sodium chloride 0.9 % 100 mL IVPB        100 mg 200 mL/hr over 30 Minutes Intravenous Daily 01/26/21 0833 01/30/21 0959   01/24/21 1600  remdesivir 100 mg in sodium chloride 0.9 % 100 mL IVPB  Status:  Discontinued       "Followed by" Linked Group Details   100 mg 200 mL/hr over 30 Minutes Intravenous Daily 01/24/21 0012 01/24/21 0840   01/24/21 1400  piperacillin-tazobactam (ZOSYN) IVPB 2.25 g  Status:  Discontinued        2.25 g 100 mL/hr over 30 Minutes Intravenous Every 8  hours 01/24/21 0613 01/25/21 1140   01/24/21 0613  vancomycin variable dose per unstable renal function (pharmacist dosing)  Status:  Discontinued         Does not apply See admin instructions 01/24/21 0613 01/25/21 1140   01/24/21 0545  vancomycin (VANCOREADY) IVPB 1750 mg/350 mL        1,750 mg 175 mL/hr over 120 Minutes Intravenous  Once 01/24/21 0534 01/24/21 1133   01/24/21 0545  piperacillin-tazobactam (ZOSYN) IVPB 3.375 g        3.375 g 100 mL/hr over 30 Minutes Intravenous  Once 01/24/21 0534 01/24/21 0647   01/24/21 0015  remdesivir 200  mg in sodium chloride 0.9% 250 mL IVPB       "Followed by" Linked Group Details   200 mg 580 mL/hr over 30 Minutes Intravenous Once 01/24/21 0012 01/24/21 [id number]      Subjective: Looking forward to go home  Objective: Vitals:   01/27/21 1015 01/27/21 1030 01/27/21 1042 01/27/21 1600  BP: 125/70 127/72 122/63   Pulse:      Resp: 20 19 (!) 24   Temp:   98 F (36.7 C) 98.2 F (36.8 C)  TempSrc:   Oral Oral  SpO2:  100% 98%   Weight:   72 kg   Height:        Intake/Output Summary (Last 24 hours) at 01/27/2021 1647 Last data filed at 01/27/2021 1500 Gross per 24 hour  Intake 560 ml  Output 2000 ml  Net -1440 ml   Filed Weights   01/24/21 1500 01/27/21 0801 01/27/21 1042  Weight: 86.2 kg 74.4 kg 72 kg    Examination: General exam: Awake, laying in bed, in nad Respiratory system: Normal respiratory effort, no wheezing Cardiovascular system: regular rate, s1, s2 Gastrointestinal system: Soft, nondistended, positive BS Central nervous system: CN2-12 grossly intact, strength intact Extremities: Perfused, no clubbing Skin: Normal skin turgor, no notable skin lesions seen Psychiatry: Mood normal // no visual hallucinations   Data Reviewed: I have personally reviewed following labs and imaging studies  CBC: Recent Labs  Lab 01/23/21 1438 01/23/21 2058 01/24/21 0537 01/25/21 0647 01/26/21 0346 01/27/21 0256  WBC 8.1  --   --  13.8* 9.3 6.7  NEUTROABS  --   --   --  12.0* 7.8* 5.1  HGB 15.2 16.0  16.3 13.9 12.1* 11.1* 11.7*  HCT 45.6 47.0  48.0 41.0 35.9* 32.7* 36.4*  MCV 90.7  --   --  89.5 89.3 91.5  PLT 132*  --   --  146* 131* [id number]   Basic Metabolic Panel: Recent Labs  Lab 01/23/21 1733 01/23/21 2058 01/24/21 1338 01/24/21 1801 01/24/21 2328 01/25/21 0723 01/27/21 0256  NA  --    < > 133* 134* 133* 136 135  K  --    < > 3.9 4.0 4.0 3.9 3.9  CL  --    < > 98 99 97* 102 101  CO2  --    < > 20* 17* 19* 21* 22  GLUCOSE  --    < > 108* 161* 153* 155* 85   BUN  --    < > 28* 32* 34* 37* 37*  CREATININE  --    < > 6.92* 7.15* 7.43* 7.71* 8.35*  CALCIUM  --    < > 7.9* 7.6* 7.4* 7.2* 6.8*  MG 1.8  --   --   --   --   --   --    < > =  values in this interval not displayed.   GFR: Estimated Creatinine Clearance: 9.3 mL/min (A) (by C-G formula based on SCr of 8.35 mg/dL (H)). Liver Function Tests: Recent Labs  Lab 01/23/21 1733 01/24/21 0644 01/27/21 0256  AST 70* 43* 27  ALT 70* 55* 32  ALKPHOS 131* 111 89  BILITOT 1.4* 0.8 0.6  PROT 6.7 6.2* 5.8*  ALBUMIN 3.1* 2.7* 2.4*   No results for input(s): LIPASE, AMYLASE in the last 168 hours. Recent Labs  Lab 01/23/21 1805  AMMONIA 24   Coagulation Profile: No results for input(s): INR, PROTIME in the last 168 hours. Cardiac Enzymes: No results for input(s): CKTOTAL, CKMB, CKMBINDEX, TROPONINI in the last 168 hours. BNP (last 3 results) No results for input(s): PROBNP in the last 8760 hours. HbA1C: No results for input(s): HGBA1C in the last 72 hours. CBG: Recent Labs  Lab 01/26/21 1751 01/26/21 2217 01/27/21 0800 01/27/21 1142 01/27/21 1603  GLUCAP 155* 140* 78 77 143*   Lipid Profile: No results for input(s): CHOL, HDL, LDLCALC, TRIG, CHOLHDL, LDLDIRECT in the last 72 hours. Thyroid Function Tests: No results for input(s): TSH, T4TOTAL, FREET4, T3FREE, THYROIDAB in the last 72 hours. Anemia Panel: No results for input(s): VITAMINB12, FOLATE, FERRITIN, TIBC, IRON, RETICCTPCT in the last 72 hours. Sepsis Labs: Recent Labs  Lab 01/23/21 1735 01/24/21 0155 01/24/21 0644  PROCALCITON  --  41.70  --   LATICACIDVEN 3.2*  --  2.8*    Recent Results (from the past 240 hour(s))  SARS Coronavirus 2 by RT PCR (hospital order, performed in [facility] hospital lab) Nasopharyngeal Nasopharyngeal Swab     Status: Abnormal   Collection Time: 01/23/21  5:35 PM   Specimen: Nasopharyngeal Swab  Result Value Ref Range Status   SARS Coronavirus 2 POSITIVE (A) NEGATIVE Final     Comment: RESULT CALLED TO, READ BACK BY AND VERIFIED WITH: RN B ATKINS 2247 [id number] FCP (NOTE) SARS-CoV-2 target nucleic acids are DETECTED  SARS-CoV-2 RNA is generally detectable in upper respiratory specimens  during the acute phase of infection.  Positive results are indicative  of the presence of the identified virus, but do not rule out bacterial infection or co-infection with other pathogens not detected by the test.  Clinical correlation with patient history and  other diagnostic information is necessary to determine patient infection status.  The expected result is negative.  Fact Sheet for Patients:   StrictlyIdeas.no   Fact Sheet for Healthcare Providers:   BankingDealers.co.za    This test is not yet approved or cleared by the [place] FDA and  has been authorized for detection and/or diagnosis of SARS-CoV-2 by FDA under an Emergency Use Authorization (EUA).  This EUA will remain in effect (meaning this test can  be used) for the duration of  the COVID-19 declaration under Section 564(b)(1) of the Act, 21 U.S.C. section 360-bbb-3(b)(1), unless the authorization is terminated or revoked sooner.  Performed at [facility], [place] [address] [place], [place] [address]   Culture, blood (Routine X 2) w Reflex to ID Panel     Status: None (Preliminary result)   Collection Time: 01/24/21 11:27 PM   Specimen: BLOOD RIGHT HAND  Result Value Ref Range Status   Specimen Description BLOOD RIGHT HAND  Final   Special Requests   Final    AEROBIC BOTTLE ONLY Blood Culture results may not be optimal due to an inadequate volume of blood received in culture bottles   Culture   Final  NO GROWTH 1 DAY Performed at [facility], [place] [address] [place], [place] [address]    Report Status PENDING  Incomplete  Culture, blood (Routine X 2) w Reflex to ID Panel     Status: None (Preliminary result)   Collection Time: 01/24/21  11:35 PM   Specimen: BLOOD RIGHT FOREARM  Result Value Ref Range Status   Specimen Description BLOOD RIGHT FOREARM  Final   Special Requests   Final    AEROBIC BOTTLE ONLY Blood Culture results may not be optimal due to an inadequate volume of blood received in culture bottles   Culture   Final    NO GROWTH 1 DAY Performed at [facility], [place] [address], [place], [place] [address]    Report Status PENDING  Incomplete  MRSA PCR Screening     Status: None   Collection Time: 01/25/21 10:33 AM   Specimen: Nasal Mucosa; Nasopharyngeal  Result Value Ref Range Status   MRSA by PCR NEGATIVE NEGATIVE Final    Comment:        The GeneXpert MRSA Assay (FDA approved for NASAL specimens only), is one component of a comprehensive MRSA colonization surveillance program. It is not intended to diagnose MRSA infection nor to guide or monitor treatment for MRSA infections. Performed at [facility], [place] [address], [place], [place] [address]      Radiology Studies: No results found.  Scheduled Meds: . vitamin C  500 mg Oral Daily  . aspirin  324 mg Oral Once  . aspirin  81 mg Oral Daily  . atorvastatin  20 mg Oral Daily  . Chlorhexidine Gluconate Cloth  6 each Topical Q0600  . cholecalciferol  1,000 Units Oral Daily  . gabapentin  100 mg Oral TID  . heparin injection (subcutaneous)  5,000 Units Subcutaneous Q8H  . insulin aspart  0-5 Units Subcutaneous QHS  . insulin aspart  0-9 Units Subcutaneous TID WC  . insulin glargine  10 Units Subcutaneous Daily  . melatonin  3 mg Oral Once  . midodrine  10 mg Oral TID WC  . pantoprazole  20 mg Oral Daily  . sodium chloride flush  10-40 mL Intracatheter Q12H  . zinc sulfate  220 mg Oral Daily   Continuous Infusions: . norepinephrine (LEVOPHED) Adult infusion 2 mcg/min (01/26/21 0500)  . remdesivir 100 mg in NS 100 mL 100 mg (01/27/21 1134)     LOS: 3 days   Marylu Lund, MD [facility] Pager On Amion  If 7PM-7AM,  please contact night-coverage 01/27/2021, 4:47 PM

## 2021-01-28 LAB — GLUCOSE, CAPILLARY
Glucose-Capillary: 82 mg/dL (ref 70–99)
Glucose-Capillary: 189 mg/dL — ABNORMAL HIGH (ref 70–99)
Glucose-Capillary: 137 mg/dL — ABNORMAL HIGH (ref 70–99)
Glucose-Capillary: 164 mg/dL — ABNORMAL HIGH (ref 70–99)

## 2021-01-28 NOTE — Progress Notes (Signed)
PROGRESS NOTE    Jose Robinson  [id number] DOB: 28-Oct-1956 DOA: 01/23/2021 PCP: Pcp, No    Brief Narrative:  65yo with hx ESRD on TTS HD, CAD, PAD, chronic systolic chf, copd, DM who presented with generalized weakness, found to have glucose just under 500. While in ED, became unresponsive and hypotensive, requiring IVF resuscitation. Tested covid pos. CCM consulted with pt later requiring pressor support. Now off pressors and transferred to Early:   Principal Problem:   DKA (diabetic ketoacidosis) ([place]) Active Problems:   COVID-19 virus infection   Syncope   ESRD (end stage renal disease) ([place])   Congestive heart failure (CHF) (HCC)   Hypotension   Shock ([place])  Hyperglycemia, possible early DKA Insulin-dependent diabetes mellitus Suspected noncompliance.   -DKA resolved and pt has been now continued on subq insulin -Glucose trends have remained stable  COVID-19 viral infection: SARS-CoV-2 PCR test positive.   -Pt reported being fully vaccinated including booster -Presented tachypnea with cxr findings of PNA -CRP noted to be over 13.3 -Received one dose of remdesivir but was d/c by CCM. Given pulmonary symptoms, pt to complete remdesivir to complete total 5 day course -overall stable at this time  Syncope:  -Suspect secondary to hypotension -resolved  Lactic acidosis:  -Presenting lactic acid 3.2.  No fever or leukocytosis.  He is not endorsing any symptoms to suggest underlying infection. -Patient has been volume resuscitated  Elevated troponin in the setting of CAD:  -High-sensitivity troponin 59.   -seen by Cardiology. No further work up recommended at this time. Recommendations noted to cont to treat underlying covid and diabetes  Slightly elevated LFTs: Likely due to COVID-19 viral infection.  -LFT trended down -Continue to monitor LFTs  ESRD on HD TTS: Chest x-ray with question of pneumonia versus pulmonary edema.   -Nephrology  following -Plan to continue HD as tolerated  Chronic systolic heart failure -Volume management with dialysis  COPD: Stable.  No wheezing. -continue with Combivent inhaler as needed.  DVT prophylaxis: Heparin subq Code Status: Full Family Communication: Pt in room, family not at bedside  Status is: Inpatient  Remains inpatient appropriate because:Unsafe d/c plan and Inpatient level of care appropriate due to severity of illness   Dispo: The patient is from: Home              Anticipated d/c is to: Home              Anticipated d/c date is: 2 days              Patient currently is not medically stable to d/c.   Difficult to place patient No   Consultants:   Nephrology  Cardiology  PCCM  Procedures:     Antimicrobials: Anti-infectives (From admission, onward)   Start     Dose/Rate Route Frequency Ordered Stop   01/26/21 1000  remdesivir 100 mg in sodium chloride 0.9 % 100 mL IVPB        100 mg 200 mL/hr over 30 Minutes Intravenous Daily 01/26/21 0833 01/30/21 0959   01/24/21 1600  remdesivir 100 mg in sodium chloride 0.9 % 100 mL IVPB  Status:  Discontinued       "Followed by" Linked Group Details   100 mg 200 mL/hr over 30 Minutes Intravenous Daily 01/24/21 0012 01/24/21 0840   01/24/21 1400  piperacillin-tazobactam (ZOSYN) IVPB 2.25 g  Status:  Discontinued        2.25 g 100 mL/hr over 30 Minutes  Intravenous Every 8 hours 01/24/21 0613 01/25/21 1140   01/24/21 0613  vancomycin variable dose per unstable renal function (pharmacist dosing)  Status:  Discontinued         Does not apply See admin instructions 01/24/21 0613 01/25/21 1140   01/24/21 0545  vancomycin (VANCOREADY) IVPB 1750 mg/350 mL        1,750 mg 175 mL/hr over 120 Minutes Intravenous  Once 01/24/21 0534 01/24/21 1133   01/24/21 0545  piperacillin-tazobactam (ZOSYN) IVPB 3.375 g        3.375 g 100 mL/hr over 30 Minutes Intravenous  Once 01/24/21 0534 01/24/21 0647   01/24/21 0015  remdesivir 200  mg in sodium chloride 0.9% 250 mL IVPB       "Followed by" Linked Group Details   200 mg 580 mL/hr over 30 Minutes Intravenous Once 01/24/21 0012 01/24/21 [id number]      Subjective: Eager to be going home soon  Objective: Vitals:   01/28/21 1300 01/28/21 1330 01/28/21 1400 01/28/21 1430  BP:   130/77   Pulse: 79 80 87 83  Resp: 16 (!) 24 (!) 24 20  Temp:      TempSrc:      SpO2: 98% 97% 98% 96%  Weight:      Height:        Intake/Output Summary (Last 24 hours) at 01/28/2021 1701 Last data filed at 01/28/2021 1400 Gross per 24 hour  Intake 973.32 ml  Output --  Net 973.32 ml   Filed Weights   01/24/21 1500 01/27/21 0801 01/27/21 1042  Weight: 86.2 kg 74.4 kg 72 kg    Examination: General exam: Conversant, in no acute distress Respiratory system: normal chest rise, clear, no audible wheezing Cardiovascular system: regular rhythm, s1-s2 Gastrointestinal system: Nondistended, nontender, pos BS Central nervous system: No seizures, no tremors Extremities: No cyanosis, no joint deformities Skin: No rashes, no pallor Psychiatry: Affect normal // no auditory hallucinations   Data Reviewed: I have personally reviewed following labs and imaging studies  CBC: Recent Labs  Lab 01/23/21 1438 01/23/21 2058 01/24/21 0537 01/25/21 0647 01/26/21 0346 01/27/21 0256  WBC 8.1  --   --  13.8* 9.3 6.7  NEUTROABS  --   --   --  12.0* 7.8* 5.1  HGB 15.2 16.0  16.3 13.9 12.1* 11.1* 11.7*  HCT 45.6 47.0  48.0 41.0 35.9* 32.7* 36.4*  MCV 90.7  --   --  89.5 89.3 91.5  PLT 132*  --   --  146* 131* [id number]   Basic Metabolic Panel: Recent Labs  Lab 01/23/21 1733 01/23/21 2058 01/24/21 1338 01/24/21 1801 01/24/21 2328 01/25/21 0723 01/27/21 0256  NA  --    < > 133* 134* 133* 136 135  K  --    < > 3.9 4.0 4.0 3.9 3.9  CL  --    < > 98 99 97* 102 101  CO2  --    < > 20* 17* 19* 21* 22  GLUCOSE  --    < > 108* 161* 153* 155* 85  BUN  --    < > 28* 32* 34* 37* 37*  CREATININE  --     < > 6.92* 7.15* 7.43* 7.71* 8.35*  CALCIUM  --    < > 7.9* 7.6* 7.4* 7.2* 6.8*  MG 1.8  --   --   --   --   --   --    < > = values in this interval not  displayed.   GFR: Estimated Creatinine Clearance: 9.3 mL/min (A) (by C-G formula based on SCr of 8.35 mg/dL (H)). Liver Function Tests: Recent Labs  Lab 01/23/21 1733 01/24/21 0644 01/27/21 0256  AST 70* 43* 27  ALT 70* 55* 32  ALKPHOS 131* 111 89  BILITOT 1.4* 0.8 0.6  PROT 6.7 6.2* 5.8*  ALBUMIN 3.1* 2.7* 2.4*   No results for input(s): LIPASE, AMYLASE in the last 168 hours. Recent Labs  Lab 01/23/21 1805  AMMONIA 24   Coagulation Profile: No results for input(s): INR, PROTIME in the last 168 hours. Cardiac Enzymes: No results for input(s): CKTOTAL, CKMB, CKMBINDEX, TROPONINI in the last 168 hours. BNP (last 3 results) No results for input(s): PROBNP in the last 8760 hours. HbA1C: No results for input(s): HGBA1C in the last 72 hours. CBG: Recent Labs  Lab 01/27/21 1603 01/27/21 2104 01/28/21 0740 01/28/21 1113 01/28/21 1624  GLUCAP 143* 233* 82 189* 137*   Lipid Profile: No results for input(s): CHOL, HDL, LDLCALC, TRIG, CHOLHDL, LDLDIRECT in the last 72 hours. Thyroid Function Tests: No results for input(s): TSH, T4TOTAL, FREET4, T3FREE, THYROIDAB in the last 72 hours. Anemia Panel: No results for input(s): VITAMINB12, FOLATE, FERRITIN, TIBC, IRON, RETICCTPCT in the last 72 hours. Sepsis Labs: Recent Labs  Lab 01/23/21 1735 01/24/21 0155 01/24/21 0644  PROCALCITON  --  41.70  --   LATICACIDVEN 3.2*  --  2.8*    Recent Results (from the past 240 hour(s))  SARS Coronavirus 2 by RT PCR (hospital order, performed in [facility] hospital lab) Nasopharyngeal Nasopharyngeal Swab     Status: Abnormal   Collection Time: 01/23/21  5:35 PM   Specimen: Nasopharyngeal Swab  Result Value Ref Range Status   SARS Coronavirus 2 POSITIVE (A) NEGATIVE Final    Comment: RESULT CALLED TO, READ BACK BY AND VERIFIED  WITH: RN B ATKINS 2247 [id number] FCP (NOTE) SARS-CoV-2 target nucleic acids are DETECTED  SARS-CoV-2 RNA is generally detectable in upper respiratory specimens  during the acute phase of infection.  Positive results are indicative  of the presence of the identified virus, but do not rule out bacterial infection or co-infection with other pathogens not detected by the test.  Clinical correlation with patient history and  other diagnostic information is necessary to determine patient infection status.  The expected result is negative.  Fact Sheet for Patients:   StrictlyIdeas.no   Fact Sheet for Healthcare Providers:   BankingDealers.co.za    This test is not yet approved or cleared by the [place] FDA and  has been authorized for detection and/or diagnosis of SARS-CoV-2 by FDA under an Emergency Use Authorization (EUA).  This EUA will remain in effect (meaning this test can  be used) for the duration of  the COVID-19 declaration under Section 564(b)(1) of the Act, 21 U.S.C. section 360-bbb-3(b)(1), unless the authorization is terminated or revoked sooner.  Performed at [facility], [place] [address], [place], [place] [address]   Culture, blood (Routine X 2) w Reflex to ID Panel     Status: None (Preliminary result)   Collection Time: 01/24/21 11:27 PM   Specimen: BLOOD RIGHT HAND  Result Value Ref Range Status   Specimen Description BLOOD RIGHT HAND  Final   Special Requests   Final    AEROBIC BOTTLE ONLY Blood Culture results may not be optimal due to an inadequate volume of blood received in culture bottles   Culture   Final    NO GROWTH  3 DAYS Performed at [facility], [place] [address] [place], [place] [address]    Report Status PENDING  Incomplete  Culture, blood (Routine X 2) w Reflex to ID Panel     Status: None (Preliminary result)   Collection Time: 01/24/21 11:35 PM   Specimen: BLOOD RIGHT FOREARM  Result  Value Ref Range Status   Specimen Description BLOOD RIGHT FOREARM  Final   Special Requests   Final    AEROBIC BOTTLE ONLY Blood Culture results may not be optimal due to an inadequate volume of blood received in culture bottles   Culture   Final    NO GROWTH 3 DAYS Performed at [facility], [place] [address] [place], [place] [address]    Report Status PENDING  Incomplete  MRSA PCR Screening     Status: None   Collection Time: 01/25/21 10:33 AM   Specimen: Nasal Mucosa; Nasopharyngeal  Result Value Ref Range Status   MRSA by PCR NEGATIVE NEGATIVE Final    Comment:        The GeneXpert MRSA Assay (FDA approved for NASAL specimens only), is one component of a comprehensive MRSA colonization surveillance program. It is not intended to diagnose MRSA infection nor to guide or monitor treatment for MRSA infections. Performed at [facility], [place] [address] [place], [place] [address]      Radiology Studies: No results found.  Scheduled Meds: . vitamin C  500 mg Oral Daily  . aspirin  324 mg Oral Once  . aspirin  81 mg Oral Daily  . atorvastatin  20 mg Oral Daily  . Chlorhexidine Gluconate Cloth  6 each Topical Q0600  . cholecalciferol  1,000 Units Oral Daily  . gabapentin  100 mg Oral TID  . heparin injection (subcutaneous)  5,000 Units Subcutaneous Q8H  . insulin aspart  0-5 Units Subcutaneous QHS  . insulin aspart  0-9 Units Subcutaneous TID WC  . insulin glargine  10 Units Subcutaneous Daily  . melatonin  3 mg Oral Once  . midodrine  10 mg Oral TID WC  . pantoprazole  20 mg Oral Daily  . sodium chloride flush  10-40 mL Intracatheter Q12H  . zinc sulfate  220 mg Oral Daily   Continuous Infusions: . norepinephrine (LEVOPHED) Adult infusion 2 mcg/min (01/26/21 0500)  . remdesivir 100 mg in NS 100 mL Stopped (01/28/21 1144)     LOS: 4 days   Marylu Lund, MD [facility] Pager On Amion  If 7PM-7AM, please contact night-coverage 01/28/2021, 5:01 PM

## 2021-01-28 NOTE — Progress Notes (Signed)
[facility] Progress Note  Subjective: seen in ICU, no issues, up walking w/ prosthetic leg on  Vitals:   01/28/21 1700 01/28/21 1800 01/28/21 1900 01/28/21 2000  BP:  (!) 149/108  (!) 151/108  Pulse: 80 78 83 85  Resp: 20 18 (!) 24 (!) 22  Temp:    98.1 F (36.7 C)  TempSrc:    Oral  SpO2: 96% 97% 96% 94%  Weight:      Height:        Exam: Gen awakens easily, O x3 No jvd or bruits Chest clear bilat to bases RRR no MRG Abd soft ntnd no mass or ascites +bs MS bilat finger amps well healed Ext R BKA, LLE 1+ edema Neuro is alert, Ox 3 , nf  L IJ TDC    Home meds:  - aricept 10/ neurontin 100 tid  - midodrine '5mg'$  pre HD TTW  - renvela 800 ac tid/ phoslo 3 ac tid  - prilosec 20 qd/ crestor 10 hs  - novolog flexpen tid ac  - pancreatic enzymes tid ac  - prn's/ vitamins/ supplements    OP HD: TTS  NW  4h   400/ 800  68.5kg   2/ 3.5Ca  TDC  Hep 4000  -  hect 1ug   - last HD 1/25    CXR 1/25 - IS edema bilat + patchy bilat infiltrates  Assessment/ Plan: 1. DKA/DM 2 - resolved, per pmd 2. COVID 19 pna - CXR w/ infiltrates, on O2 nasal cannula.  3. Syncope - related to #1 and #2 4. ^trop/ hx CAD - no further w/u per cards 5. ESRD - TTS HD. Had HD yest. Next HD Tuesday.  6. PAD - sp R BKA, bilat finger amps. Per pmd 7. 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 Anemia ckd - Hb 11, follow 10. Dispo - ok for dc from renal standpoint    Kelly Splinter 01/28/2021, 8:15 PM   Recent Labs  Lab 01/25/21 0723 01/26/21 0346 01/27/21 0256  K 3.9  --  3.9  BUN 37*  --  37*  CREATININE 7.71*  --  8.35*  CALCIUM 7.2*  --  6.8*  HGB  --  11.1* 11.7*   Inpatient medications: . vitamin C  500 mg Oral Daily  . aspirin  324 mg Oral Once  . aspirin  81 mg Oral Daily  . atorvastatin  20 mg Oral Daily  . Chlorhexidine Gluconate Cloth  6 each Topical Q0600  . cholecalciferol  1,000 Units Oral Daily  . gabapentin   100 mg Oral TID  . heparin injection (subcutaneous)  5,000 Units Subcutaneous Q8H  . insulin aspart  0-5 Units Subcutaneous QHS  . insulin aspart  0-9 Units Subcutaneous TID WC  . insulin glargine  10 Units Subcutaneous Daily  . melatonin  3 mg Oral Once  . midodrine  10 mg Oral TID WC  . pantoprazole  20 mg Oral Daily  . sodium chloride flush  10-40 mL Intracatheter Q12H  . zinc sulfate  220 mg Oral Daily   . norepinephrine (LEVOPHED) Adult infusion 2 mcg/min (01/26/21 0500)  . remdesivir 100 mg in NS 100 mL Stopped (01/28/21 1144)   dextrose, guaiFENesin-codeine, heparin, Ipratropium-Albuterol, sodium chloride flush

## 2021-01-29 LAB — GLUCOSE, CAPILLARY
Glucose-Capillary: 94 mg/dL (ref 70–99)
Glucose-Capillary: 173 mg/dL — ABNORMAL HIGH (ref 70–99)
Glucose-Capillary: 225 mg/dL — ABNORMAL HIGH (ref 70–99)
Glucose-Capillary: 134 mg/dL — ABNORMAL HIGH (ref 70–99)

## 2021-01-29 MED ORDER — HYDRALAZINE HCL 20 MG/ML IJ SOLN: 10.0000 mg | Freq: Four times a day (QID) | INTRAMUSCULAR | Status: DC | PRN

## 2021-01-29 NOTE — Progress Notes (Signed)
Renal Navigator followed up on request for COVID wheelchair transportation through [employer] update.   Alphonzo Cruise, [place] Renal Navigator [phone]

## 2021-01-29 NOTE — Progress Notes (Signed)
Physical Therapy Treatment Patient Details Name: Jose Robinson MRN: [id number] DOB: November 14, 1956 Today's Date: 01/29/2021    History of Present Illness 65yo with hx ESRD on TTS HD, CAD, PAD, chronic systolic chf, copd, DM, R BKA who presented with generalized weakness, found to have glucose just under 500. While in ED, became unresponsive and hypotensive, requiring IVF resuscitation. Tested covid pos.    PT Comments    Pt eager to return home and stating he hopes he can go today. Pt with min assist for bed mobility and transfers with assist to don prosthesis EOB. Pt requires total assist to don prosthesis for liner, sock and prosthesis with pt with particular method of holding knee in extension to slide prosthesis on then putting pressure through it in flexion. It required 4 attempts to don prosthesis and pt reports no changes in normal status of donning limb. Once limb on pt able to walk in room with reliance on RW with good stability and maintained SpO2 >98% on RA   Follow Up Recommendations  Home health PT;Supervision for mobility/OOB     Equipment Recommendations  None recommended by PT    Recommendations for Other Services       Precautions / Restrictions Precautions Precautions: Fall Other Brace: R prosthesis    Mobility  Bed Mobility Overal bed mobility: Needs Assistance Bed Mobility: Supine to Sit     Supine to sit: Min assist;HOB elevated     General bed mobility comments: HOB 30 degrees with use of rail and min assist to fully transition trunk to sitting  Transfers Overall transfer level: Needs assistance   Transfers: Sit to/from Stand;Stand Pivot Transfers Sit to Stand: Min guard Stand pivot transfers: Min guard       General transfer comment: guarding for safety with pt able to stand from bed x 2 trials and pivot to recliner with RW and prosthesis  Ambulation/Gait Ambulation/Gait assistance: Min guard Gait Distance (Feet): 60 Feet Assistive device:  Rolling walker (2 wheeled) Gait Pattern/deviations: Step-through pattern;Decreased stride length;Trunk flexed   Gait velocity interpretation: <1.8 ft/sec, indicate of risk for recurrent falls General Gait Details: guarding for safety with cues for direction due to lines   Stairs             Wheelchair Mobility    Modified Rankin (Stroke Patients Only)       Balance Overall balance assessment: Needs assistance   Sitting balance-Leahy Scale: Fair Sitting balance - Comments: EOB without UE support   Standing balance support: Bilateral upper extremity supported Standing balance-Leahy Scale: Poor Standing balance comment: RW for standing and gait                            Cognition Arousal/Alertness: Awake/alert Behavior During Therapy: WFL for tasks assessed/performed Overall Cognitive Status: Within Functional Limits for tasks assessed                                        Exercises      General Comments        Pertinent Vitals/Pain Pain Assessment: No/denies pain    Home Living                      Prior Function            PT Goals (current goals can now be found  in the care plan section) Progress towards PT goals: Progressing toward goals    Frequency    Min 3X/week      PT Plan Current plan remains appropriate    Co-evaluation              AM-PAC PT "6 Clicks" Mobility   Outcome Measure  Help needed turning from your back to your side while in a flat bed without using bedrails?: None Help needed moving from lying on your back to sitting on the side of a flat bed without using bedrails?: A Little Help needed moving to and from a bed to a chair (including a wheelchair)?: A Little Help needed standing up from a chair using your arms (e.g., wheelchair or bedside chair)?: A Little Help needed to walk in hospital room?: A Little Help needed climbing 3-5 steps with a railing? : A Lot 6 Click Score:  18    End of Session   Activity Tolerance: Patient tolerated treatment well Patient left: in chair;with call bell/phone within reach Nurse Communication: Mobility status PT Visit Diagnosis: Unsteadiness on feet (R26.81);Muscle weakness (generalized) (M62.81);Difficulty in walking, not elsewhere classified (R26.2)     Time: [id number] PT Time Calculation (min) (ACUTE ONLY): 36 min  Charges:  $Gait Training: 8-22 mins $Therapeutic Activity: 8-22 mins                     Tayjon Halladay P, PT Acute Rehabilitation Services Pager: [phone] Office: [place] 01/29/2021, 12:58 PM

## 2021-01-29 NOTE — Progress Notes (Signed)
Paged Dr. Cyd Silence with [facility] to request anti-hypertensive med for blood pressure 173/96.

## 2021-01-29 NOTE — Progress Notes (Addendum)
Renal Navigator appreciates update from [facility] CM/W. Estelle Grumbles that patient has a caregiver who states he will not be able to continue to care for patient and therefore, patient does not currently have anywhere to go at discharge. Navigator has still been awaiting COVID transportation arrangements to get back and forth to HD, but will cancel this request at this time and update outpatient HD clinic/NW on what has happened.  Navigator continuing to follow.  Alphonzo Cruise, [place] Renal Navigator [phone]

## 2021-01-29 NOTE — TOC Progression Note (Signed)
Transition of Care [facility]) - Progression Note    Patient Details  Name: BURNIS TENERELLI MRN: [id number] Date of Birth: [birth year]  [facility] CM/SW Contact  Bartholomew Crews, RN Phone Number: [phone] 01/29/2021, 4:31 PM  Clinical Narrative:     Spoke with patient this afternoon on his mobile phone. Discussed search for facility placement. Patient agreeable. FL2 faxed out throughout [facility]. Spoke with representative at [facility] to discuss bed availability, provided contact information for call back. TOC following for transition needs.   Expected Discharge Plan: [place] Barriers to Discharge: Continued Medical Work up  Expected Discharge Plan and Services Expected Discharge Plan: [place] In-house Referral: NA Discharge Planning Services: CM Consult Post Acute Care Choice: [place] arrangements for the past 2 months: Single Family Home                 DME Arranged: Glucometer DME Agency: NA       HH Arranged: RN,PT,OT,Nurse's Aide HH Agency: [facility] Date [place]: 01/29/21 Time [place]: [place] Representative spoke with at [place]: [place] ([place]) Interventions    Readmission Risk Interventions No flowsheet data found.

## 2021-01-29 NOTE — NC FL2 (Addendum)
[place] LEVEL OF CARE SCREENING TOOL     IDENTIFICATION  Patient Name: Jose Robinson Birthdate: [birth year] Sex: male Admission Date (Current Location): 01/23/2021  [place] and [place] Number:  [name] [id number] Facility and Address:  [facility], [place] [address] [place], [place] [address]      Provider Number: [id number]  Attending Physician Name and Address:  Donne Hazel, MD  Relative Name and Phone Number:       Current Level of Care: Hospital Recommended Level of Care: Nursing Facility Prior Approval Number:    Date Approved/Denied:   PASRR Number: [id number]  Discharge Plan: SNF    Current Diagnoses: Patient Active Problem List   Diagnosis Date Noted  . DKA (diabetic ketoacidosis) ([place]) 01/24/2021  . COVID-19 virus infection 01/24/2021  . Syncope 01/24/2021  . ESRD (end stage renal disease) ([place]) 01/24/2021  . Congestive heart failure (CHF) ([place]) 01/24/2021  . Hypotension 01/24/2021  . Shock ([place]) 01/24/2021    Orientation RESPIRATION BLADDER Height & Weight     Self,Time,Situation,Place  Normal Continent Weight: 72 kg Height:  '5\' 11"'  (180.3 cm)  BEHAVIORAL SYMPTOMS/MOOD NEUROLOGICAL BOWEL NUTRITION STATUS      Continent  (regular)  AMBULATORY STATUS COMMUNICATION OF NEEDS Skin   Limited Assist Verbally Normal                       Personal Care Assistance Level of Assistance  Bathing,Dressing,Feeding Bathing Assistance: Limited assistance Feeding assistance: Independent Dressing Assistance: Limited assistance     Functional Limitations Info  Sight,Hearing,Speech Sight Info: Adequate Hearing Info: Adequate Speech Info: Adequate    SPECIAL CARE FACTORS FREQUENCY                       Contractures Contractures Info: Not present    Additional Factors Info  Code Status,Allergies Code Status Info: Full Allergies Info: acetaminophen, prednisone, IV dye           Current  Medications (01/29/2021):  This is the current hospital active medication list Current Facility-Administered Medications  Medication Dose Route Frequency Provider Last Rate Last Admin  . ascorbic acid (VITAMIN C) tablet 500 mg  500 mg Oral Daily Shela Leff, MD   500 mg at 01/29/21 0924  . aspirin chewable tablet 324 mg  324 mg Oral Once Shela Leff, MD      . aspirin chewable tablet 81 mg  81 mg Oral Daily Candee Furbish, MD   81 mg at 01/29/21 3149  . atorvastatin (LIPITOR) tablet 20 mg  20 mg Oral Daily Candee Furbish, MD   20 mg at 01/29/21 7026  . Chlorhexidine Gluconate Cloth 2 % PADS 6 each  6 each Topical Q0600 Roney Jaffe, MD   6 each at 01/29/21 [phone]  . cholecalciferol (VITAMIN D3) tablet 1,000 Units  1,000 Units Oral Daily Shela Leff, MD   1,000 Units at 01/29/21 [phone]  . dextrose 50 % solution 0-50 mL  0-50 mL Intravenous PRN Shela Leff, MD      . gabapentin (NEURONTIN) capsule 100 mg  100 mg Oral TID Jacky Kindle, MD   100 mg at 01/29/21 0924  . guaiFENesin-codeine 100-10 MG/5ML solution 5 mL  5 mL Oral Q4H PRN Jacky Kindle, MD   5 mL at 01/29/21 0924  . heparin injection 2,000 Units  2,000 Units Dialysis PRN Roney Jaffe, MD      . heparin injection 5,000 Units  5,000 Units Subcutaneous Q8H Candee Furbish, MD   5,000 Units at 01/29/21 1400  . insulin aspart (novoLOG) injection 0-5 Units  0-5 Units Subcutaneous QHS Donne Hazel, MD   2 Units at 01/27/21 2133  . insulin aspart (novoLOG) injection 0-9 Units  0-9 Units Subcutaneous TID WC Donne Hazel, MD   2 Units at 01/29/21 1215  . insulin glargine (LANTUS) injection 10 Units  10 Units Subcutaneous Daily Donne Hazel, MD   10 Units at 01/29/21 [phone]  . Ipratropium-Albuterol (COMBIVENT) respimat 1 puff  1 puff Inhalation Q6H PRN Shela Leff, MD      . melatonin tablet 3 mg  3 mg Oral Once Frederik Pear, MD      . midodrine (PROAMATINE) tablet 10 mg  10 mg Oral TID WC Jacky Kindle, MD   10 mg at 01/29/21 1203  . norepinephrine (LEVOPHED) 69m in 2578mpremix infusion  0-10 mcg/min Intravenous Titrated Ogan, [place]or[place]wo U, MD 7.5 mL/hr at 01/26/21 0500 2 mcg/min at 01/26/21 0500  . pantoprazole (PROTONIX) EC tablet 20 mg  20 mg Oral Daily ChJacky KindleMD   20 mg at 01/29/21 0934  . sodium chloride flush (NS) 0.9 % injection 10-40 mL  10-40 mL Intracatheter PRN SmCandee FurbishMD      . sodium chloride flush (NS) 0.9 % injection 10-40 mL  10-40 mL Intracatheter Q12H JeRenee PainMD   10 mL at 01/29/21 0938  . zinc sulfate capsule 220 mg  220 mg Oral Daily RaShela LeffMD   220 mg at 01/29/21 [id number]   Discharge Medications:  STOP taking these medications   multivitamin Tabs tablet     TAKE these medications   aspirin 81 MG chewable tablet Chew 1 tablet (81 mg total) by mouth daily. Start taking on: February 10, 2021   atorvastatin 20 MG tablet Commonly known as: LIPITOR Take 1 tablet (20 mg total) by mouth daily. Start taking on: February 10, 2021   blood glucose meter kit and supplies Kit Dispense based on patient and insurance preference. Use up to four times daily as directed. (FOR ICD-9 250.00, 250.01).   calcium acetate 667 MG capsule Commonly known as: PHOSLO Take 1,334 mg by mouth 3 (three) times daily with meals.   donepezil 10 MG tablet Commonly known as: ARICEPT Take 10 mg by mouth daily.   gabapentin 100 MG capsule Commonly known as: NEURONTIN Take 100 mg by mouth 3 (three) times daily.   glucagon 1 MG injection Inject 1 mg into the muscle daily as needed (low blood sugar).   midodrine 5 MG tablet Commonly known as: PROAMATINE Take 5 mg by mouth See admin instructions. Take one tablet (5 mg) by mouth Tuesday, Thursday, Saturday before dialysis   naproxen sodium 220 MG tablet Commonly known as: ALEVE Take 440 mg by mouth 2 (two) times daily as needed (pain/headache).   NovoLOG FlexPen 100 UNIT/ML  FlexPen Generic drug: insulin aspart Inject 10 Units into the skin 3 (three) times daily as needed for high blood sugar.   nystatin ointment Commonly known as: MYCOSTATIN Apply 1 application topically daily.   omeprazole 20 MG capsule Commonly known as: PRILOSEC Take 20 mg by mouth 2 (two) times daily before a meal.   ondansetron 4 MG tablet Commonly known as: ZOFRAN Take 4 mg by mouth every 8 (eight) hours as needed for nausea or vomiting.   pantoprazole 20 MG tablet Commonly known as: PROTONIX Take 1  tablet (20 mg total) by mouth daily. Start taking on: February 10, 2021   rosuvastatin 10 MG tablet Commonly known as: CRESTOR Take 10 mg by mouth at bedtime.   Semglee 100 UNIT/ML Solostar Pen Generic drug: insulin glargine Inject into the skin daily.   sevelamer carbonate 800 MG tablet Commonly known as: RENVELA Take 800 mg by mouth 3 (three) times daily with meals.   Trulicity 1.5 MO/7.0BE Sopn Generic drug: Dulaglutide Inject 1.5 mg into the skin every Thursday.   Zenpep 5000-24000 units Cpep Generic drug: Pancrelipase (Lip-Prot-Amyl) Take 1 capsule by mouth 3 (three) times daily with meals.       Relevant Imaging Results:  Relevant Lab Results:   Additional Information Covid+ 1/25 - HD TTS -SSN [id number]  Bartholomew Crews, RN

## 2021-01-29 NOTE — TOC Progression Note (Signed)
Transition of Care [facility]) - Progression Note    Patient Details  Name: Jose Robinson MRN: [id number] Date of Birth: 1956-01-02  Transition of Care [facility]) CM/SW Contact  Bartholomew Crews, RN Phone Number: [phone] 01/29/2021, 2:19 PM  Clinical Narrative:     Spoke with Rushie Chestnut on the phone. He stated that patient cannot come back to stay with him and his wife. He stated that when patient left the nursing home they had a verbal agreement to take things month by month, and tomorrow is 30 days.   Spoke with patient on the phone. Patient seemed surprised that Herbie Baltimore had said he could not return to [facility] home. Discussed going back to nursing facility, like [facility], and patient stated, "I'm not going back to no [facility]." Patient requested to call CM back stating he needed to call Herbie Baltimore.   TOC following for transition needs.   Expected Discharge Plan: [place] Barriers to Discharge: Continued Medical Work up  Expected Discharge Plan and Services Expected Discharge Plan: [place] In-house Referral: NA Discharge Planning Services: CM Consult Post Acute Care Choice: [place] arrangements for the past 2 months: Single Family Home                 DME Arranged: Glucometer DME Agency: NA       HH Arranged: RN,PT,OT,Nurse's Aide HH Agency: [facility] Date [place]: 01/29/21 Time [place]: [place] Representative spoke with at [place]: [place] ([place]) Interventions    Readmission Risk Interventions No flowsheet data found.

## 2021-01-29 NOTE — Progress Notes (Signed)
PROGRESS NOTE    Jose Robinson  [id number] DOB: [birth year] DOA: 01/23/2021 PCP: Pcp, No    Brief Narrative:  65yo with hx ESRD on TTS HD, CAD, PAD, chronic systolic chf, copd, DM who presented with generalized weakness, found to have glucose just under 500. While in ED, became unresponsive and hypotensive, requiring IVF resuscitation. Tested covid pos. CCM consulted with pt later requiring pressor support. Now off pressors and transferred to [place]:   Principal Problem:   DKA (diabetic ketoacidosis) ([place]) Active Problems:   COVID-19 virus infection   Syncope   ESRD (end stage renal disease) ([place])   Congestive heart failure (CHF) ([facility])   Hypotension   Shock ([place])  Hyperglycemia, possible early DKA Insulin-dependent diabetes mellitus Suspected noncompliance.   -DKA resolved and pt has been now continued on subq insulin -Glucose trends have remained stable  COVID-19 viral infection: SARS-CoV-2 PCR test positive.   -Pt reported being fully vaccinated including booster -Presented tachypnea with cxr findings of PNA -CRP noted to be over 13.3 -completed course of remdesivir -remains stable at this time  Syncope:  -Suspect secondary to hypotension -resolved  Lactic acidosis:  -Presenting lactic acid 3.2.  No fever or leukocytosis.  He is not endorsing any symptoms to suggest underlying infection. -Patient has been volume resuscitated  Elevated troponin in the setting of CAD:  -High-sensitivity troponin 59.   -seen by Cardiology. No further work up recommended at this time. Recommendations noted to cont to treat underlying covid and diabetes  Slightly elevated LFTs: Likely due to COVID-19 viral infection.  -LFT trended down -Stable  ESRD on HD TTS: Chest x-ray with question of pneumonia versus pulmonary edema.   -Nephrology following -Plan to continue HD as tolerated  Chronic systolic heart failure -Volume management with dialysis  COPD:  Stable.  No wheezing. -continue with Combivent inhaler as needed.  DVT prophylaxis: Heparin subq Code Status: Full Family Communication: Pt in room, family not at bedside  Status is: Inpatient  Remains inpatient appropriate because:Unsafe d/c plan   Dispo: The patient is from: Home              Anticipated d/c is to: SNF              Anticipated d/c date is: 2 days              Patient currently is medically stable to d/c. Just pending dispo   Difficult to place patient No   Consultants:   Nephrology  Cardiology  PCCM  Procedures:     Antimicrobials: Anti-infectives (From admission, onward)   Start     Dose/Rate Route Frequency Ordered Stop   01/26/21 1000  remdesivir 100 mg in sodium chloride 0.9 % 100 mL IVPB        100 mg 200 mL/hr over 30 Minutes Intravenous Daily 01/26/21 0833 01/29/21 0959   01/24/21 1600  remdesivir 100 mg in sodium chloride 0.9 % 100 mL IVPB  Status:  Discontinued       "Followed by" Linked Group Details   100 mg 200 mL/hr over 30 Minutes Intravenous Daily 01/24/21 0012 01/24/21 0840   01/24/21 1400  piperacillin-tazobactam (ZOSYN) IVPB 2.25 g  Status:  Discontinued        2.25 g 100 mL/hr over 30 Minutes Intravenous Every 8 hours 01/24/21 0613 01/25/21 1140   01/24/21 0613  vancomycin variable dose per unstable renal function (pharmacist dosing)  Status:  Discontinued  Does not apply See admin instructions 01/24/21 0613 01/25/21 1140   01/24/21 0545  vancomycin (VANCOREADY) IVPB 1750 mg/350 mL        1,750 mg 175 mL/hr over 120 Minutes Intravenous  Once 01/24/21 0534 01/24/21 1133   01/24/21 0545  piperacillin-tazobactam (ZOSYN) IVPB 3.375 g        3.375 g 100 mL/hr over 30 Minutes Intravenous  Once 01/24/21 0534 01/24/21 0647   01/24/21 0015  remdesivir 200 mg in sodium chloride 0.9% 250 mL IVPB       "Followed by" Linked Group Details   200 mg 580 mL/hr over 30 Minutes Intravenous Once 01/24/21 0012 01/24/21 0608       Subjective: Hoping to go home  Objective: Vitals:   01/29/21 1100 01/29/21 1150 01/29/21 1548 01/29/21 1600  BP:  (!) 173/90 (!) 171/93 (!) 161/93  Pulse: 82 83  73  Resp: (!) '22 16  18  '$ Temp:  97.6 F (36.4 C) (!) 97.5 F (36.4 C)   TempSrc:  Axillary Oral   SpO2: (!) 83% 95%  97%  Weight:      Height:        Intake/Output Summary (Last 24 hours) at 01/29/2021 1658 Last data filed at 01/29/2021 1353 Gross per 24 hour  Intake 600 ml  Output --  Net 600 ml   Filed Weights   01/24/21 1500 01/27/21 0801 01/27/21 1042  Weight: 86.2 kg 74.4 kg 72 kg    Examination: General exam: Awake, laying in bed, in nad Respiratory system: Normal respiratory effort, no wheezing Cardiovascular system: regular rate, s1, s2 Gastrointestinal system: Soft, nondistended, positive BS Central nervous system: CN2-12 grossly intact, strength intact Extremities: Perfused, no clubbing Skin: Normal skin turgor, no notable skin lesions seen Psychiatry: Mood normal // no visual hallucinations   Data Reviewed: I have personally reviewed following labs and imaging studies  CBC: Recent Labs  Lab 01/23/21 1438 01/23/21 2058 01/24/21 0537 01/25/21 0647 01/26/21 0346 01/27/21 0256  WBC 8.1  --   --  13.8* 9.3 6.7  NEUTROABS  --   --   --  12.0* 7.8* 5.1  HGB 15.2 16.0  16.3 13.9 12.1* 11.1* 11.7*  HCT 45.6 47.0  48.0 41.0 35.9* 32.7* 36.4*  MCV 90.7  --   --  89.5 89.3 91.5  PLT 132*  --   --  146* 131* [id number]   Basic Metabolic Panel: Recent Labs  Lab 01/23/21 1733 01/23/21 2058 01/24/21 1338 01/24/21 1801 01/24/21 2328 01/25/21 0723 01/27/21 0256  NA  --    < > 133* 134* 133* 136 135  K  --    < > 3.9 4.0 4.0 3.9 3.9  CL  --    < > 98 99 97* 102 101  CO2  --    < > 20* 17* 19* 21* 22  GLUCOSE  --    < > 108* 161* 153* 155* 85  BUN  --    < > 28* 32* 34* 37* 37*  CREATININE  --    < > 6.92* 7.15* 7.43* 7.71* 8.35*  CALCIUM  --    < > 7.9* 7.6* 7.4* 7.2* 6.8*  MG 1.8  --   --    --   --   --   --    < > = values in this interval not displayed.   GFR: Estimated Creatinine Clearance: 9.3 mL/min (A) (by C-G formula based on SCr of 8.35 mg/dL (H)). Liver Function Tests:  Recent Labs  Lab 01/23/21 1733 01/24/21 0644 01/27/21 0256  AST 70* 43* 27  ALT 70* 55* 32  ALKPHOS 131* 111 89  BILITOT 1.4* 0.8 0.6  PROT 6.7 6.2* 5.8*  ALBUMIN 3.1* 2.7* 2.4*   No results for input(s): LIPASE, AMYLASE in the last 168 hours. Recent Labs  Lab 01/23/21 1805  AMMONIA 24   Coagulation Profile: No results for input(s): INR, PROTIME in the last 168 hours. Cardiac Enzymes: No results for input(s): CKTOTAL, CKMB, CKMBINDEX, TROPONINI in the last 168 hours. BNP (last 3 results) No results for input(s): PROBNP in the last 8760 hours. HbA1C: No results for input(s): HGBA1C in the last 72 hours. CBG: Recent Labs  Lab 01/28/21 1624 01/28/21 2130 01/29/21 0841 01/29/21 1206 01/29/21 1551  GLUCAP 137* 164* 94 173* 225*   Lipid Profile: No results for input(s): CHOL, HDL, LDLCALC, TRIG, CHOLHDL, LDLDIRECT in the last 72 hours. Thyroid Function Tests: No results for input(s): TSH, T4TOTAL, FREET4, T3FREE, THYROIDAB in the last 72 hours. Anemia Panel: No results for input(s): VITAMINB12, FOLATE, FERRITIN, TIBC, IRON, RETICCTPCT in the last 72 hours. Sepsis Labs: Recent Labs  Lab 01/23/21 1735 01/24/21 0155 01/24/21 0644  PROCALCITON  --  41.70  --   LATICACIDVEN 3.2*  --  2.8*    Recent Results (from the past 240 hour(s))  SARS Coronavirus 2 by RT PCR (hospital order, performed in [facility] hospital lab) Nasopharyngeal Nasopharyngeal Swab     Status: Abnormal   Collection Time: 01/23/21  5:35 PM   Specimen: Nasopharyngeal Swab  Result Value Ref Range Status   SARS Coronavirus 2 POSITIVE (A) NEGATIVE Final    Comment: RESULT CALLED TO, READ BACK BY AND VERIFIED WITH: RN B ATKINS 2247 [id number] FCP (NOTE) SARS-CoV-2 target nucleic acids are DETECTED  SARS-CoV-2  RNA is generally detectable in upper respiratory specimens  during the acute phase of infection.  Positive results are indicative  of the presence of the identified virus, but do not rule out bacterial infection or co-infection with other pathogens not detected by the test.  Clinical correlation with patient history and  other diagnostic information is necessary to determine patient infection status.  The expected result is negative.  Fact Sheet for Patients:   StrictlyIdeas.no   Fact Sheet for Healthcare Providers:   BankingDealers.co.za    This test is not yet approved or cleared by the [place] FDA and  has been authorized for detection and/or diagnosis of SARS-CoV-2 by FDA under an Emergency Use Authorization (EUA).  This EUA will remain in effect (meaning this test can  be used) for the duration of  the COVID-19 declaration under Section 564(b)(1) of the Act, 21 U.S.C. section 360-bbb-3(b)(1), unless the authorization is terminated or revoked sooner.  Performed at [facility], [place] [address], [place], [place] [address]   Culture, blood (Routine X 2) w Reflex to ID Panel     Status: None (Preliminary result)   Collection Time: 01/24/21 11:27 PM   Specimen: BLOOD RIGHT HAND  Result Value Ref Range Status   Specimen Description BLOOD RIGHT HAND  Final   Special Requests   Final    AEROBIC BOTTLE ONLY Blood Culture results may not be optimal due to an inadequate volume of blood received in culture bottles   Culture   Final    NO GROWTH 4 DAYS Performed at [facility], [place] [address] [place],  [address]    Report Status PENDING  Incomplete  Culture, blood (Routine X 2) w Reflex to ID Panel     Status: None (Preliminary result)   Collection Time: 01/24/21 11:35 PM   Specimen: BLOOD RIGHT FOREARM  Result Value Ref Range Status   Specimen Description BLOOD RIGHT FOREARM  Final   Special Requests   Final     AEROBIC BOTTLE ONLY Blood Culture results may not be optimal due to an inadequate volume of blood received in culture bottles   Culture   Final    NO GROWTH 4 DAYS Performed at [facility], [place] [address], [place], [place] [address]    Report Status PENDING  Incomplete  MRSA PCR Screening     Status: None   Collection Time: 01/25/21 10:33 AM   Specimen: Nasal Mucosa; Nasopharyngeal  Result Value Ref Range Status   MRSA by PCR NEGATIVE NEGATIVE Final    Comment:        The GeneXpert MRSA Assay (FDA approved for NASAL specimens only), is one component of a comprehensive MRSA colonization surveillance program. It is not intended to diagnose MRSA infection nor to guide or monitor treatment for MRSA infections. Performed at [facility], [place] [address], [place], [place] [address]      Radiology Studies: No results found.  Scheduled Meds: . vitamin C  500 mg Oral Daily  . aspirin  324 mg Oral Once  . aspirin  81 mg Oral Daily  . atorvastatin  20 mg Oral Daily  . Chlorhexidine Gluconate Cloth  6 each Topical Q0600  . cholecalciferol  1,000 Units Oral Daily  . gabapentin  100 mg Oral TID  . heparin injection (subcutaneous)  5,000 Units Subcutaneous Q8H  . insulin aspart  0-5 Units Subcutaneous QHS  . insulin aspart  0-9 Units Subcutaneous TID WC  . insulin glargine  10 Units Subcutaneous Daily  . melatonin  3 mg Oral Once  . midodrine  10 mg Oral TID WC  . pantoprazole  20 mg Oral Daily  . sodium chloride flush  10-40 mL Intracatheter Q12H  . zinc sulfate  220 mg Oral Daily   Continuous Infusions: . norepinephrine (LEVOPHED) Adult infusion 2 mcg/min (01/26/21 0500)     LOS: 5 days   Marylu Lund, MD [facility] Pager On Amion  If 7PM-7AM, please contact night-coverage 01/29/2021, 4:58 PM

## 2021-01-29 NOTE — TOC Initial Note (Signed)
Transition of Care [facility]) - Initial/Assessment Note    Patient Details  Name: Jose Robinson MRN: [id number] Date of Birth: 04-02-1956  Transition of Care [facility]) CM/SW Contact:    Bartholomew Crews, RN Phone Number: [phone] 01/29/2021, 11:28 AM  Clinical Narrative:                  Spoke with patient on his mobile phone to discuss transition planning. Patient stated that he lives with Rushie Chestnut at the address listed in Epic. He stated that his primary care provider is [facility] and verified preferred pharmacy as listed in [place]. Patient stated that he receives home health services from [facility], but he needed more hours. Discussed that he will need to work with his PCP to get PCS hours. Patient stated that he normally uses [employer] to get to dialysis, but he will need to have alternate transportation set up until he is 14 days post Covid diagnosis. Noted that renal navigator was working on [employer] dialysis transportation. Discussed that Herbie Baltimore had left a message for call back - patient provided verbal consent to discuss his care with Herbie Baltimore.   Spoke with Herbie Baltimore on the phone. Herbie Baltimore stated that patient had told him on Friday that he had Covid, but Herbie Baltimore didn't know if this was true. Discussed isolation recommendations. Herbie Baltimore stated that he is his caregiver and expressed concerns that no one from the hospital called to advise him of the covid diagnosis. Herbie Baltimore stated that he is not sure that he can continue to care for patient stating that he brought patient home with him about 25 days ago, but he is unsure that he can continue to be patient's caregiver stating that he didn't realize all that was actually involved. Patient had previously lived at [facility] Herbie Baltimore stated that he will talk to patient and decide what the next step should be.   Spoke with Britney at [facility]. Patient is active for RN, PT, OT, Aide. If patient discharges back to community, will need [place] orders for  resumption of services.   Received chat message this morning sent yesterday that patient needs a glucometer. If patient discharges back to community, will need prescription for glucometer that patient can take to pharmacy.   TOC following for transition needs.   Expected Discharge Plan: [place] Barriers to Discharge: Continued Medical Work up   Patient Goals and CMS Choice Patient states their goals for this hospitalization and ongoing recovery are:: return home to previous living arrangements CMS Medicare.gov Compare Post Acute Care list provided to:: Patient Choice offered to / list presented to : Patient  Expected Discharge Plan and Services Expected Discharge Plan: [place] In-house Referral: NA Discharge Planning Services: CM Consult Post Acute Care Choice: Home Health,Durable Medical Equipment Living arrangements for the past 2 months: Single Family Home                 DME Arranged: Glucometer DME Agency: NA       HH Arranged: RN,PT,OT,Nurse's Aide HH Agency: [facility] Date HH Agency Contacted: 01/29/21 Time [place]: 1057 Representative spoke with at [place]: [place]  Prior Living Arrangements/Services Living arrangements for the past 2 months: [place] with:: Self,Friends Patient language and need for interpreter reviewed:: Yes Do you feel safe going back to the place where you live?: Yes      Need for Family Participation in Patient Care: Yes (  Comment) Care giver support system in place?: No (comment) Herbie Baltimore not sure that they can continue to care for patient) Current home services: DME Criminal Activity/Legal Involvement Pertinent to Current Situation/Hospitalization: No - Comment as needed  Activities of Daily Living      Permission Sought/Granted Permission sought to share information with : Family Supports Permission granted to share information with : Yes, Verbal Permission  Granted  Share Information with NAME: Rushie Chestnut     Permission granted to share info w Relationship: caregiver  Permission granted to share info w Contact Information: [phone]  Emotional Assessment Appearance:: Appears stated age Attitude/Demeanor/Rapport: Engaged Affect (typically observed): Accepting Orientation: : Oriented to Self,Oriented to Place,Oriented to  Time,Oriented to Situation Alcohol / Substance Use: Not Applicable Psych Involvement: No (comment)  Admission diagnosis:  Shock ([place]) [R57.9] Hyperglycemia [R73.9] Hypotension [I95.9] Diabetic ketoacidosis without coma associated with other specified diabetes mellitus ([place]) [E13.10] Syncope, unspecified syncope type [R55] COVID [U07.1] Patient Active Problem List   Diagnosis Date Noted  . DKA (diabetic ketoacidosis) ([place]) 01/24/2021  . COVID-19 virus infection 01/24/2021  . Syncope 01/24/2021  . ESRD (end stage renal disease) ([place]) 01/24/2021  . Congestive heart failure (CHF) ([place]) 01/24/2021  . Hypotension 01/24/2021  . Shock ([place]) 01/24/2021   PCP:  Pcp, No Pharmacy:   [id number] [id number]: [phone]: [phone]    Social Determinants of Health (SDOH) Interventions    Readmission Risk Interventions No flowsheet data found.

## 2021-01-29 NOTE — Progress Notes (Signed)
Patient transferred to 5N27 at [id number] without complication. Patient had no complaints, call light given to patient and 5N RN Ivin Booty at bedside.

## 2021-01-30 LAB — CBC
HCT: 39 % (ref 39.0–52.0)
Hemoglobin: 12.1 g/dL — ABNORMAL LOW (ref 13.0–17.0)
MCH: 29 pg (ref 26.0–34.0)
MCHC: 31 g/dL (ref 30.0–36.0)
MCV: 93.5 fL (ref 80.0–100.0)
Platelets: 169 10*3/uL (ref 150–400)
RBC: 4.17 MIL/uL — ABNORMAL LOW (ref 4.22–5.81)
RDW: 14.4 % (ref 11.5–15.5)
WBC: 3.6 10*3/uL — ABNORMAL LOW (ref 4.0–10.5)
nRBC: 0 % (ref 0.0–0.2)

## 2021-01-30 LAB — RENAL FUNCTION PANEL
Albumin: 2.5 g/dL — ABNORMAL LOW (ref 3.5–5.0)
Anion gap: 15 (ref 5–15)
BUN: 73 mg/dL — ABNORMAL HIGH (ref 8–23)
CO2: 20 mmol/L — ABNORMAL LOW (ref 22–32)
Calcium: 5.3 mg/dL — CL (ref 8.9–10.3)
Chloride: 95 mmol/L — ABNORMAL LOW (ref 98–111)
Creatinine, Ser: 11.39 mg/dL — ABNORMAL HIGH (ref 0.61–1.24)
GFR, Estimated: 5 mL/min — ABNORMAL LOW (ref >60)
Glucose, Bld: 331 mg/dL — ABNORMAL HIGH (ref 70–99)
Phosphorus: 4.8 mg/dL — ABNORMAL HIGH (ref 2.5–4.6)
Potassium: 6 mmol/L — ABNORMAL HIGH (ref 3.5–5.1)
Sodium: 130 mmol/L — ABNORMAL LOW (ref 135–145)

## 2021-01-30 LAB — GLUCOSE, CAPILLARY
Glucose-Capillary: 76 mg/dL (ref 70–99)
Glucose-Capillary: 103 mg/dL — ABNORMAL HIGH (ref 70–99)
Glucose-Capillary: 162 mg/dL — ABNORMAL HIGH (ref 70–99)
Glucose-Capillary: 180 mg/dL — ABNORMAL HIGH (ref 70–99)

## 2021-01-30 LAB — CULTURE, BLOOD (ROUTINE X 2)
Culture: NO GROWTH
Culture: NO GROWTH

## 2021-01-30 MED ORDER — BLOOD GLUCOSE MONITOR KIT: PACK | 0 refills | Status: DC

## 2021-01-30 MED ORDER — INSULIN GLARGINE 100 UNIT/ML ~~LOC~~ SOLN
8.0000 [IU] | Freq: Every day | SUBCUTANEOUS | Status: DC
  Administered 2021-01-31 – 2021-02-01 (×2): 8 [IU] via SUBCUTANEOUS
  Filled 2021-01-30 (×2): qty 0.08

## 2021-01-30 MED ORDER — BLOOD GLUCOSE MONITOR KIT: PACK | 0 refills | Status: AC

## 2021-01-30 MED ORDER — HEPARIN SODIUM (PORCINE) 1000 UNIT/ML IJ SOLN
INTRAMUSCULAR | Status: AC
  Filled 2021-01-30: qty 10

## 2021-01-30 NOTE — Progress Notes (Signed)
PROGRESS NOTE    Jose Robinson  [id number] DOB: 12-06-1956 DOA: 01/23/2021 PCP: Pcp, No    Brief Narrative:  65yo with hx ESRD on TTS HD, CAD, PAD, chronic systolic chf, copd, DM who presented with generalized weakness, found to have glucose just under 500. While in ED, became unresponsive and hypotensive, requiring IVF resuscitation. Tested covid pos. CCM consulted with pt later requiring pressor support. Now off pressors and transferred to [place]:   Principal Problem:   DKA (diabetic ketoacidosis) ([place]) Active Problems:   COVID-19 virus infection   Syncope   ESRD (end stage renal disease) ([place])   Congestive heart failure (CHF) (HCC)   Hypotension   Shock ([place])  Hyperglycemia, possible early DKA Insulin-dependent diabetes mellitus Suspected noncompliance.   -DKA resolved and pt has been now continued on subq insulin -Glucose trends have remained stable  COVID-19 viral infection: SARS-CoV-2 PCR test positive.   -Pt reported being fully vaccinated including booster -Presented tachypnea with cxr findings of PNA -CRP noted to be over 13.3 -completed course of remdesivir -currently remains stable  Syncope:  -Suspect secondary to hypotension -resolved  Lactic acidosis:  -Presenting lactic acid 3.2.  No fever or leukocytosis.  He is not endorsing any symptoms to suggest underlying infection. -Patient has been volume resuscitated  Elevated troponin in the setting of CAD:  -High-sensitivity troponin 59.   -seen by Cardiology. No further work up recommended at this time. Recommendations noted to cont to treat underlying covid and diabetes  Slightly elevated LFTs: Likely due to COVID-19 viral infection.  -LFT trended down -Stable  ESRD on HD TTS: Chest x-ray with question of pneumonia versus pulmonary edema.   -Nephrology following -Plan to continue HD as tolerated  Chronic systolic heart failure -Continue with volume management with dialysis  per Nephrology  COPD: Stable.  No wheezing. -continue with Combivent inhaler as needed.  DVT prophylaxis: Heparin subq Code Status: Full Family Communication: Pt in room, family not at bedside  Status is: Inpatient  Remains inpatient appropriate because:Unsafe d/c plan   Dispo: The patient is from: Home              Anticipated d/c is to: SNF              Anticipated d/c date is: 2 days              Patient currently is medically stable to d/c. Just pending dispo   Difficult to place patient No   Consultants:   Nephrology  Cardiology  PCCM  Procedures:     Antimicrobials: Anti-infectives (From admission, onward)   Start     Dose/Rate Route Frequency Ordered Stop   01/26/21 1000  remdesivir 100 mg in sodium chloride 0.9 % 100 mL IVPB        100 mg 200 mL/hr over 30 Minutes Intravenous Daily 01/26/21 0833 01/29/21 0959   01/24/21 1600  remdesivir 100 mg in sodium chloride 0.9 % 100 mL IVPB  Status:  Discontinued       "Followed by" Linked Group Details   100 mg 200 mL/hr over 30 Minutes Intravenous Daily 01/24/21 0012 01/24/21 0840   01/24/21 1400  piperacillin-tazobactam (ZOSYN) IVPB 2.25 g  Status:  Discontinued        2.25 g 100 mL/hr over 30 Minutes Intravenous Every 8 hours 01/24/21 0613 01/25/21 1140   01/24/21 0613  vancomycin variable dose per unstable renal function (pharmacist dosing)  Status:  Discontinued  Does not apply See admin instructions 01/24/21 0613 01/25/21 1140   01/24/21 0545  vancomycin (VANCOREADY) IVPB 1750 mg/350 mL        1,750 mg 175 mL/hr over 120 Minutes Intravenous  Once 01/24/21 0534 01/24/21 1133   01/24/21 0545  piperacillin-tazobactam (ZOSYN) IVPB 3.375 g        3.375 g 100 mL/hr over 30 Minutes Intravenous  Once 01/24/21 0534 01/24/21 0647   01/24/21 0015  remdesivir 200 mg in sodium chloride 0.9% 250 mL IVPB       "Followed by" Linked Group Details   200 mg 580 mL/hr over 30 Minutes Intravenous Once 01/24/21 0012  01/24/21 0608      Subjective: Eager to be discharged soon  Objective: Vitals:   01/29/21 2105 01/29/21 2200 01/30/21 0300 01/30/21 1609  BP: (!) 151/73 (!) 143/61 (!) 151/86 136/67  Pulse: 74 69 70 85  Resp: '15 17 19 18  '$ Temp:  98.8 F (37.1 C) (!) 97.5 F (36.4 C) 97.7 F (36.5 C)  TempSrc:  Oral Oral Oral  SpO2: 99% 97% 100% 95%  Weight:      Height:        Intake/Output Summary (Last 24 hours) at 01/30/2021 1654 Last data filed at 01/30/2021 [id number] Gross per 24 hour  Intake 480 ml  Output --  Net 480 ml   Filed Weights   01/24/21 1500 01/27/21 0801 01/27/21 1042  Weight: 86.2 kg 74.4 kg 72 kg    Examination: General exam: Conversant, in no acute distress Respiratory system: normal chest rise, clear, no audible wheezing Cardiovascular system: regular rhythm, s1-s2 Gastrointestinal system: Nondistended, nontender, pos BS Central nervous system: No seizures, no tremors Extremities: No cyanosis, no joint deformities Skin: No rashes, no pallor Psychiatry: Affect normal // no auditory hallucinations   Data Reviewed: I have personally reviewed following labs and imaging studies  CBC: Recent Labs  Lab 01/23/21 2058 01/24/21 0537 01/25/21 0647 01/26/21 0346 01/27/21 0256  WBC  --   --  13.8* 9.3 6.7  NEUTROABS  --   --  12.0* 7.8* 5.1  HGB 16.0  16.3 13.9 12.1* 11.1* 11.7*  HCT 47.0  48.0 41.0 35.9* 32.7* 36.4*  MCV  --   --  89.5 89.3 91.5  PLT  --   --  146* 131* [id number]   Basic Metabolic Panel: Recent Labs  Lab 01/23/21 1733 01/23/21 2058 01/24/21 1338 01/24/21 1801 01/24/21 2328 01/25/21 0723 01/27/21 0256  NA  --    < > 133* 134* 133* 136 135  K  --    < > 3.9 4.0 4.0 3.9 3.9  CL  --    < > 98 99 97* 102 101  CO2  --    < > 20* 17* 19* 21* 22  GLUCOSE  --    < > 108* 161* 153* 155* 85  BUN  --    < > 28* 32* 34* 37* 37*  CREATININE  --    < > 6.92* 7.15* 7.43* 7.71* 8.35*  CALCIUM  --    < > 7.9* 7.6* 7.4* 7.2* 6.8*  MG 1.8  --   --   --   --    --   --    < > = values in this interval not displayed.   GFR: Estimated Creatinine Clearance: 9.3 mL/min (A) (by C-G formula based on SCr of 8.35 mg/dL (H)). Liver Function Tests: Recent Labs  Lab 01/23/21 1733 01/24/21 [id number] 01/27/21 0256  AST 70* 43* 27  ALT 70* 55* 32  ALKPHOS 131* 111 89  BILITOT 1.4* 0.8 0.6  PROT 6.7 6.2* 5.8*  ALBUMIN 3.1* 2.7* 2.4*   No results for input(s): LIPASE, AMYLASE in the last 168 hours. Recent Labs  Lab 01/23/21 1805  AMMONIA 24   Coagulation Profile: No results for input(s): INR, PROTIME in the last 168 hours. Cardiac Enzymes: No results for input(s): CKTOTAL, CKMB, CKMBINDEX, TROPONINI in the last 168 hours. BNP (last 3 results) No results for input(s): PROBNP in the last 8760 hours. HbA1C: No results for input(s): HGBA1C in the last 72 hours. CBG: Recent Labs  Lab 01/29/21 1551 01/29/21 2059 01/30/21 0642 01/30/21 1113 01/30/21 1635  GLUCAP 225* 134* 76 103* 162*   Lipid Profile: No results for input(s): CHOL, HDL, LDLCALC, TRIG, CHOLHDL, LDLDIRECT in the last 72 hours. Thyroid Function Tests: No results for input(s): TSH, T4TOTAL, FREET4, T3FREE, THYROIDAB in the last 72 hours. Anemia Panel: No results for input(s): VITAMINB12, FOLATE, FERRITIN, TIBC, IRON, RETICCTPCT in the last 72 hours. Sepsis Labs: Recent Labs  Lab 01/23/21 1735 01/24/21 0155 01/24/21 0644  PROCALCITON  --  41.70  --   LATICACIDVEN 3.2*  --  2.8*    Recent Results (from the past 240 hour(s))  SARS Coronavirus 2 by RT PCR (hospital order, performed in [facility] hospital lab) Nasopharyngeal Nasopharyngeal Swab     Status: Abnormal   Collection Time: 01/23/21  5:35 PM   Specimen: Nasopharyngeal Swab  Result Value Ref Range Status   SARS Coronavirus 2 POSITIVE (A) NEGATIVE Final    Comment: RESULT CALLED TO, READ BACK BY AND VERIFIED WITH: RN B ATKINS 2247 [id number] FCP (NOTE) SARS-CoV-2 target nucleic acids are DETECTED  SARS-CoV-2 RNA is  generally detectable in upper respiratory specimens  during the acute phase of infection.  Positive results are indicative  of the presence of the identified virus, but do not rule out bacterial infection or co-infection with other pathogens not detected by the test.  Clinical correlation with patient history and  other diagnostic information is necessary to determine patient infection status.  The expected result is negative.  Fact Sheet for Patients:   StrictlyIdeas.no   Fact Sheet for Healthcare Providers:   BankingDealers.co.za    This test is not yet approved or cleared by the [place] FDA and  has been authorized for detection and/or diagnosis of SARS-CoV-2 by FDA under an Emergency Use Authorization (EUA).  This EUA will remain in effect (meaning this test can  be used) for the duration of  the COVID-19 declaration under Section 564(b)(1) of the Act, 21 U.S.C. section 360-bbb-3(b)(1), unless the authorization is terminated or revoked sooner.  Performed at [facility], [place] [address], [place], [place] [address]   Culture, blood (Routine X 2) w Reflex to ID Panel     Status: None   Collection Time: 01/24/21 11:27 PM   Specimen: BLOOD RIGHT HAND  Result Value Ref Range Status   Specimen Description BLOOD RIGHT HAND  Final   Special Requests   Final    AEROBIC BOTTLE ONLY Blood Culture results may not be optimal due to an inadequate volume of blood received in culture bottles   Culture   Final    NO GROWTH 5 DAYS Performed at [facility], [name] [address] [place], [place] [address]    Report Status 01/30/2021 FINAL  Final  Culture, blood (Routine X 2) w Reflex to ID Panel  Status: None   Collection Time: 01/24/21 11:35 PM   Specimen: BLOOD RIGHT FOREARM  Result Value Ref Range Status   Specimen Description BLOOD RIGHT FOREARM  Final   Special Requests   Final    AEROBIC BOTTLE ONLY Blood Culture results  may not be optimal due to an inadequate volume of blood received in culture bottles   Culture   Final    NO GROWTH 5 DAYS Performed at [facility], [place] [address] [place], [place] [address]    Report Status 01/30/2021 FINAL  Final  MRSA PCR Screening     Status: None   Collection Time: 01/25/21 10:33 AM   Specimen: Nasal Mucosa; Nasopharyngeal  Result Value Ref Range Status   MRSA by PCR NEGATIVE NEGATIVE Final    Comment:        The GeneXpert MRSA Assay (FDA approved for NASAL specimens only), is one component of a comprehensive MRSA colonization surveillance program. It is not intended to diagnose MRSA infection nor to guide or monitor treatment for MRSA infections. Performed at [facility], [place] [address], [place], [place] [address]      Radiology Studies: No results found.  Scheduled Meds: . vitamin C  500 mg Oral Daily  . aspirin  324 mg Oral Once  . aspirin  81 mg Oral Daily  . atorvastatin  20 mg Oral Daily  . Chlorhexidine Gluconate Cloth  6 each Topical Q0600  . cholecalciferol  1,000 Units Oral Daily  . gabapentin  100 mg Oral TID  . heparin injection (subcutaneous)  5,000 Units Subcutaneous Q8H  . insulin aspart  0-5 Units Subcutaneous QHS  . insulin aspart  0-9 Units Subcutaneous TID WC  . [START ON 01/31/2021] insulin glargine  8 Units Subcutaneous Daily  . melatonin  3 mg Oral Once  . pantoprazole  20 mg Oral Daily  . sodium chloride flush  10-40 mL Intracatheter Q12H  . zinc sulfate  220 mg Oral Daily   Continuous Infusions: . norepinephrine (LEVOPHED) Adult infusion 2 mcg/min (01/26/21 0500)     LOS: 6 days   Marylu Lund, MD [facility] Pager On Amion  If 7PM-7AM, please contact night-coverage 01/30/2021, 4:54 PM

## 2021-01-30 NOTE — Progress Notes (Addendum)
[facility] Progress Note   Subjective:   Pt seen in room. Reports feeling well, no CP, palpitations, dizziness, abdominal pian, nausea or vomiting.   Objective Vitals:   01/29/21 2000 01/29/21 2105 01/29/21 2200 01/30/21 0300  BP: (!) 173/96 (!) 151/73 (!) 143/61 (!) 151/86  Pulse: 77 74 69 70  Resp: '17 15 17 19  '$ Temp:   98.8 F (37.1 C) (!) 97.5 F (36.4 C)  TempSrc:   Oral Oral  SpO2: 94% 99% 97% 100%  Weight:      Height:       Physical Exam General: WDWN male, alert and in NAD Heart: RRR, no murmurs, rubs or gallops Lungs: CTA bilaterally without wheezing, rhonchi or rales Abdomen: Soft, non-tender, non-distended, +BS Extremities: No edema b/l lower extremities Dialysis Access:  TDC, LUE AVF no bruit  Additional Objective Labs: Basic Metabolic Panel: Recent Labs  Lab 01/24/21 2328 01/25/21 0723 01/27/21 0256  NA 133* 136 135  K 4.0 3.9 3.9  CL 97* 102 101  CO2 19* 21* 22  GLUCOSE 153* 155* 85  BUN 34* 37* 37*  CREATININE 7.43* 7.71* 8.35*  CALCIUM 7.4* 7.2* 6.8*   Liver Function Tests: Recent Labs  Lab 01/23/21 1733 01/24/21 0644 01/27/21 0256  AST 70* 43* 27  ALT 70* 55* 32  ALKPHOS 131* 111 89  BILITOT 1.4* 0.8 0.6  PROT 6.7 6.2* 5.8*  ALBUMIN 3.1* 2.7* 2.4*   CBC: Recent Labs  Lab 01/23/21 1438 01/23/21 2058 01/25/21 0647 01/26/21 0346 01/27/21 0256  WBC 8.1  --  13.8* 9.3 6.7  NEUTROABS  --   --  12.0* 7.8* 5.1  HGB 15.2   < > 12.1* 11.1* 11.7*  HCT 45.6   < > 35.9* 32.7* 36.4*  MCV 90.7  --  89.5 89.3 91.5  PLT 132*  --  146* 131* 154   < > = values in this interval not displayed.   Blood Culture    Component Value Date/Time   SDES BLOOD RIGHT FOREARM 01/24/2021 2335   SPECREQUEST  01/24/2021 2335    AEROBIC BOTTLE ONLY Blood Culture results may not be optimal due to an inadequate volume of blood received in culture bottles   CULT  01/24/2021 2335    NO GROWTH 4 DAYS Performed at [facility], [place] [address] [place],  [address]    REPTSTATUS PENDING 01/24/2021 2335    CBG: Recent Labs  Lab 01/29/21 1206 01/29/21 1551 01/29/21 2059 01/30/21 0642 01/30/21 1113  GLUCAP 173* 225* 134* 76 103*   Medications: . norepinephrine (LEVOPHED) Adult infusion 2 mcg/min (01/26/21 0500)   . vitamin C  500 mg Oral Daily  . aspirin  324 mg Oral Once  . aspirin  81 mg Oral Daily  . atorvastatin  20 mg Oral Daily  . Chlorhexidine Gluconate Cloth  6 each Topical Q0600  . cholecalciferol  1,000 Units Oral Daily  . gabapentin  100 mg Oral TID  . heparin injection (subcutaneous)  5,000 Units Subcutaneous Q8H  . insulin aspart  0-5 Units Subcutaneous QHS  . insulin aspart  0-9 Units Subcutaneous TID WC  . insulin glargine  10 Units Subcutaneous Daily  . melatonin  3 mg Oral Once  . pantoprazole  20 mg Oral Daily  . sodium chloride flush  10-40 mL Intracatheter Q12H  . zinc sulfate  220 mg Oral Daily    Dialysis Orders: TTS NW 4h 400/ 800 68.5kg 2/ 3.5Ca TDC Hep 4000 -  hect 1ug - last HD 1/25  CXR 1/25 - IS edema bilat + patchy bilat infiltrates  Assessment/Plan: 1. DKA/DM 2- resolved, per pmd 2. COVID 19 pna - CXR w/ infiltrates, on O2 nasal cannula.  3. Syncope -related to #1 and #2 4. ^trop/ hx CAD- no further w/u per cards 5. ESRD - TTS HD.HD planned for today, next HD will be Thursday. Treatments here are shortened due to high census/nursing shortage. 6. PAD -sp R BKA, bilat finger amps. Per pmd 7. BP/ volume -no vol excess on exam but above EDW. UF with HD as tolerated today.  8. MBD ckd - Corrected calcium slightly low. Use 3.5Ca bath with HD and continue vdra w/ HD 9. Anemia ckd - Hb 11.7, no ESA indicated at this time.  10. Dispo -ok for dc from renal standpoint, however caregiver no longer able to handle patient and will require placement.  SW/CM and renal navigator involved.    Anice Paganini, PA-C 01/30/2021, 11:46 AM  [facility] Pager: [phone]  I have seen and examined this patient and agree with plan and assessment in the above note with renal recommendations/intervention highlighted.  Governor Rooks Duru Reiger,MD 01/30/2021 2:51 PM

## 2021-01-30 NOTE — Plan of Care (Addendum)
Pt vitals stable. Eating well. No complaints. Insulin pen video was shown to patient.   Problem: Education: Goal: Knowledge of General Education information will improve Description: Including pain rating scale, medication(s)/side effects and non-pharmacologic comfort measures Outcome: Progressing   Problem: Health Behavior/Discharge Planning: Goal: Ability to manage health-related needs will improve Outcome: Progressing   Problem: Nutrition: Goal: Adequate nutrition will be maintained Outcome: Progressing   Problem: Coping: Goal: Level of anxiety will decrease Outcome: Progressing   Problem: Pain Managment: Goal: General experience of comfort will improve Outcome: Progressing   Problem: Safety: Goal: Ability to remain free from injury will improve Outcome: Progressing

## 2021-01-30 NOTE — Progress Notes (Signed)
Inpatient Diabetes Program Recommendations  AACE/ADA: New Consensus Statement on Inpatient Glycemic Control (2015)  Target Ranges:  Prepandial:   less than 140 mg/dL      Peak postprandial:   less than 180 mg/dL (1-2 hours)      Critically ill patients:  140 - 180 mg/dL   Lab Results  Component Value Date   GLUCAP 103 (H) 01/30/2021   HGBA1C 10.4 (H) 01/24/2021    Review of Glycemic Control Results for GARAN, COHEN (MRN [id number]) as of 01/30/2021 14:14  Ref. Range 01/29/2021 12:06 01/29/2021 15:51 01/29/2021 20:59 01/30/2021 06:42 01/30/2021 11:13  Glucose-Capillary Latest Ref Range: 70 - 99 mg/dL 173 (H) 225 (H) 134 (H) 76 103 (H)   Diabetes history: DM 2/ ESRD Outpatient Diabetes medications:  Semglee 10 units daily, Trulicity 1.5 mg weekly, Novolog 10 units prn high blood sugar Current orders for Inpatient glycemic control:  Lantus 10 units daily, Novolog sensitive tid with meals and HS  Inpatient Diabetes Program Recommendations:    Please consider reducing Lantus to 8 units daily.        Spoke with patient by phone regarding home DM regimen.  He states that he has been taking Trulicity weekly but not Semglee or Novolog b/c he did not think he needed it.        Discussed medications and explained that Trulicity is no the same as Lantus or Novolog.  Discussed that Lantus is usually given daily and not on a sliding scale.  Discussed that Lantus is long acting and not dependent on intake.  Patient asked on dialysis days if he should wait until after dialysis to take insulin?  Told him yes.  Also told patient that he must check blood sugars twice a day and not just assume they are ok? Reviewed signs and symptoms of hypoglycemia and proper treatment.  Patients states he had a low blood sugar years ago, and he felt "terrible".  He states he is going to Assisted living facility and that hopefully they can help him with medicines and monitoring.  Asked RN to show patient patient education video  on use of insulin pen as well.  Patient appreciative of call.  Will need reinforcement.    Thanks,  Adah Perl, RN, BC-ADM Inpatient Diabetes Coordinator Pager [phone] (8a-5p)

## 2021-01-30 NOTE — Social Work (Signed)
SW/CM following for SNF vs ALF. No SNF offers received at this time. Previous CM reached out to [facility]. SW spoke to [employer] at [employer] who is agreeable to review pt information. Faxed referral to [facility] [phone]. Will continue placement efforts.   Wandra Feinstein, MSW, LCSW [phone] (GV coverage)

## 2021-01-30 NOTE — Progress Notes (Signed)
S: No issues overnight O:BP (!) 151/86 (BP Location: Left Leg)   Pulse 70   Temp (!) 97.5 F (36.4 C) (Oral)   Resp 19   Ht '5\' 11"'$  (1.803 m)   Wt 72 kg   SpO2 100%   BMI 22.14 kg/m   Intake/Output Summary (Last 24 hours) at 01/30/2021 0756 Last data filed at 01/29/2021 1800 Gross per 24 hour  Intake 840 ml  Output -  Net 840 ml   Intake/Output: I/O last 3 completed shifts: In: 840 [P.O.:840] Out: -   Intake/Output this shift:  No intake/output data recorded. Weight change:  Gen: sitting in chair in NAD   Recent Labs  Lab 01/23/21 1733 01/23/21 2058 01/24/21 0155 01/24/21 0537 01/24/21 [id number] 01/24/21 1338 01/24/21 1801 01/24/21 2328 01/25/21 0723 01/27/21 0256  NA  --    < > 133* 137 135 133* 134* 133* 136 135  K  --    < > 4.1 3.3* 3.7 3.9 4.0 4.0 3.9 3.9  CL  --    < > 97*  --  98 98 99 97* 102 101  CO2  --   --  21*  --  22 20* 17* 19* 21* 22  GLUCOSE  --    < > 307*  --  154* 108* 161* 153* 155* 85  BUN  --    < > 22  --  24* 28* 32* 34* 37* 37*  CREATININE  --    < > 6.55*  --  6.77* 6.92* 7.15* 7.43* 7.71* 8.35*  ALBUMIN 3.1*  --   --   --  2.7*  --   --   --   --  2.4*  CALCIUM  --   --  7.8*  --  7.6* 7.9* 7.6* 7.4* 7.2* 6.8*  AST 70*  --   --   --  43*  --   --   --   --  27  ALT 70*  --   --   --  55*  --   --   --   --  32   < > = values in this interval not displayed.   Liver Function Tests: Recent Labs  Lab 01/23/21 1733 01/24/21 0644 01/27/21 0256  AST 70* 43* 27  ALT 70* 55* 32  ALKPHOS 131* 111 89  BILITOT 1.4* 0.8 0.6  PROT 6.7 6.2* 5.8*  ALBUMIN 3.1* 2.7* 2.4*   No results for input(s): LIPASE, AMYLASE in the last 168 hours. Recent Labs  Lab 01/23/21 1805  AMMONIA 24   CBC: Recent Labs  Lab 01/23/21 1438 01/23/21 2058 01/25/21 0647 01/26/21 0346 01/27/21 0256  WBC 8.1  --  13.8* 9.3 6.7  NEUTROABS  --   --  12.0* 7.8* 5.1  HGB 15.2   < > 12.1* 11.1* 11.7*  HCT 45.6   < > 35.9* 32.7* 36.4*  MCV 90.7  --  89.5 89.3 91.5   PLT 132*  --  146* 131* 154   < > = values in this interval not displayed.   Cardiac Enzymes: No results for input(s): CKTOTAL, CKMB, CKMBINDEX, TROPONINI in the last 168 hours. CBG: Recent Labs  Lab 01/29/21 0841 01/29/21 1206 01/29/21 1551 01/29/21 2059 01/30/21 0642  GLUCAP 94 173* 225* 134* 76    Iron Studies: No results for input(s): IRON, TIBC, TRANSFERRIN, FERRITIN in the last 72 hours. Studies/Results: No results found. . vitamin C  500 mg Oral Daily  . aspirin  324 mg Oral Once  . aspirin  81 mg Oral Daily  . atorvastatin  20 mg Oral Daily  . Chlorhexidine Gluconate Cloth  6 each Topical Q0600  . cholecalciferol  1,000 Units Oral Daily  . gabapentin  100 mg Oral TID  . heparin injection (subcutaneous)  5,000 Units Subcutaneous Q8H  . insulin aspart  0-5 Units Subcutaneous QHS  . insulin aspart  0-9 Units Subcutaneous TID WC  . insulin glargine  10 Units Subcutaneous Daily  . melatonin  3 mg Oral Once  . pantoprazole  20 mg Oral Daily  . sodium chloride flush  10-40 mL Intracatheter Q12H  . zinc sulfate  220 mg Oral Daily    BMET    Component Value Date/Time   NA 135 01/27/2021 0256   K 3.9 01/27/2021 0256   CL 101 01/27/2021 0256   CO2 22 01/27/2021 0256   GLUCOSE 85 01/27/2021 0256   BUN 37 (H) 01/27/2021 0256   CREATININE 8.35 (H) 01/27/2021 0256   CALCIUM 6.8 (L) 01/27/2021 0256   GFRNONAA 7 (L) 01/27/2021 0256   CBC    Component Value Date/Time   WBC 6.7 01/27/2021 0256   RBC 3.98 (L) 01/27/2021 0256   HGB 11.7 (L) 01/27/2021 0256   HCT 36.4 (L) 01/27/2021 0256   PLT 154 01/27/2021 0256   MCV 91.5 01/27/2021 0256   MCH 29.4 01/27/2021 0256   MCHC 32.1 01/27/2021 0256   RDW 14.0 01/27/2021 0256   LYMPHSABS 1.0 01/27/2021 0256   MONOABS 0.5 01/27/2021 0256   EOSABS 0.1 01/27/2021 0256   BASOSABS 0.0 01/27/2021 0256      OP HD:TTS NW 4h 400/ 800 68.5kg 2/ 3.5Ca TDC Hep 4000 - hect 1ug - last HD 1/25  CXR 1/25 - IS  edema bilat + patchy bilat infiltrates  Assessment/ Plan: 1. DKA/DM 2 - resolved, per pmd 2. COVID 19 pna - CXR w/ infiltrates, on O2 nasal cannula.  3. Syncope - related to #1 and #2 4. ^trop/ hx CAD - no further w/u per cards 5. ESRD - TTS HD. Had HD yest. Next HD Tuesday.  6. PAD - sp R BKA, bilat finger amps. Per pmd 7. 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 Anemia ckd - Hb 11, follow 10. Dispo - ok for dc from renal standpoint, however caregiver no longer able to handle patient and will require placement.  SW/CM and renal navigator involved.    Donetta Potts, MD [employer] [phone]

## 2021-01-30 NOTE — Progress Notes (Signed)
Inpatient HD order is in for today.

## 2021-01-30 NOTE — Progress Notes (Signed)
Contacted on call Nephrologist at [phone] Dr. Candiss Norse.  He will contact day shift MD and have orders put in for HD today.

## 2021-01-31 DIAGNOSIS — E0810 Diabetes mellitus due to underlying condition with ketoacidosis without coma: Secondary | ICD-10-CM

## 2021-01-31 DIAGNOSIS — I5021 Acute systolic (congestive) heart failure: Secondary | ICD-10-CM

## 2021-01-31 LAB — GLUCOSE, CAPILLARY
Glucose-Capillary: 209 mg/dL — ABNORMAL HIGH (ref 70–99)
Glucose-Capillary: 280 mg/dL — ABNORMAL HIGH (ref 70–99)
Glucose-Capillary: 151 mg/dL — ABNORMAL HIGH (ref 70–99)
Glucose-Capillary: 122 mg/dL — ABNORMAL HIGH (ref 70–99)

## 2021-01-31 MED ORDER — OXYCODONE HCL 5 MG PO TABS
10.0000 mg | ORAL_TABLET | Freq: Four times a day (QID) | ORAL | Status: DC | PRN
  Administered 2021-01-31 – 2021-02-09 (×16): 10 mg via ORAL
  Filled 2021-01-31 (×17): qty 2

## 2021-01-31 NOTE — Progress Notes (Signed)
Patient returned from HD.

## 2021-01-31 NOTE — Progress Notes (Signed)
Physical Therapy Treatment Patient Details Name: Jose Robinson MRN: [id number] DOB: [birth year] Today's Date: 01/31/2021    History of Present Illness 65yo with hx ESRD on TTS HD, CAD, PAD, chronic systolic chf, copd, DM, R BKA who presented with generalized weakness, found to have glucose just under 500. While in ED, became unresponsive and hypotensive, requiring IVF resuscitation. Tested covid pos.    PT Comments    Pt seated in recliner on arrival.  Agreeable to work with PT.  Performed LE/UE strengthening and progression of gt training.  Continue to recommend return home with HHPT.      Follow Up Recommendations  Home health PT;Supervision for mobility/OOB     Equipment Recommendations       Recommendations for Other Services       Precautions / Restrictions Precautions Precautions: Fall Required Braces or Orthoses: Other Brace Other Brace: R prosthesis Restrictions Weight Bearing Restrictions: No    Mobility  Bed Mobility               General bed mobility comments: Pt seated in recliner on arrival with feet in dependent position.  Transfers Overall transfer level: Needs assistance Equipment used: Rolling walker (2 wheeled) Transfers: Sit to/from [employer] Sit to Stand: Mod assist         General transfer comment: Mod assistance to rise from recliner chair into standing.  Pt required facilitation for forward weight shifitng and assistance to boost into standing.  Ambulation/Gait Ambulation/Gait assistance: Min guard;Min assist Gait Distance (Feet): 80 Feet Assistive device: Rolling walker (2 wheeled) Gait Pattern/deviations: Step-through pattern;Decreased stride length;Trunk flexed     General Gait Details: Cues for upper trunk control and forward gaze.  Assistance for turns and backing.  Pt required assistance to guide RW at times.   Stairs             Wheelchair Mobility    Modified Rankin (Stroke Patients Only)        Balance Overall balance assessment: Needs assistance Sitting-balance support: Single extremity supported;Feet supported Sitting balance-Leahy Scale: Fair Sitting balance - Comments: EOB without UE support   Standing balance support: Bilateral upper extremity supported Standing balance-Leahy Scale: Poor Standing balance comment: RW for standing and gait                            Cognition Arousal/Alertness: Awake/alert Behavior During Therapy: WFL for tasks assessed/performed Overall Cognitive Status: Within Functional Limits for tasks assessed                                        Exercises General Exercises - Upper Extremity Shoulder Flexion: AROM;Both;10 reps;Supine Elbow Flexion: AROM;Both;10 reps;Supine Digit Composite Flexion: AROM;Both;10 reps;Supine General Exercises - Lower Extremity Long Arc Quad: AROM;Both;10 reps;Seated Hip ABduction/ADduction: AROM;Both;10 reps;Seated Hip Flexion/Marching: AROM;Both;10 reps;Seated    General Comments        Pertinent Vitals/Pain Pain Assessment: No/denies pain    Home Living                      Prior Function            PT Goals (current goals can now be found in the care plan section) Acute Rehab PT Goals Patient Stated Goal: return home Potential to Achieve Goals: Good Progress towards PT goals: Progressing toward goals  Frequency    Min 3X/week      PT Plan Current plan remains appropriate    Co-evaluation              AM-PAC PT "6 Clicks" Mobility   Outcome Measure  Help needed turning from your back to your side while in a flat bed without using bedrails?: None Help needed moving from lying on your back to sitting on the side of a flat bed without using bedrails?: A Little Help needed moving to and from a bed to a chair (including a wheelchair)?: A Little Help needed standing up from a chair using your arms (e.g., wheelchair or bedside chair)?: A  Little Help needed to walk in hospital room?: A Little Help needed climbing 3-5 steps with a railing? : A Lot 6 Click Score: 18    End of Session Equipment Utilized During Treatment: Gait belt Activity Tolerance: Patient tolerated treatment well Patient left: in chair;with call bell/phone within reach Nurse Communication: Mobility status PT Visit Diagnosis: Unsteadiness on feet (R26.81);Muscle weakness (generalized) (M62.81);Difficulty in walking, not elsewhere classified (R26.2)     Time: [id number] PT Time Calculation (min) (ACUTE ONLY): 20 min  Charges:  $Gait Training: 8-22 mins                     [name] , PTA Acute Rehabilitation Services Pager [phone] Office [phone]     Draya Felker [name] 01/31/2021, 6:28 PM

## 2021-01-31 NOTE — Progress Notes (Addendum)
Patient ID: JARAMIE BUHL, male   DOB: 1956-06-17, 65 y.o.   MRN: [id number] S: Completed HD late last night/early this morning. O:BP 126/70 (BP Location: Left Leg)   Pulse 90   Temp 98.7 F (37.1 C) (Oral)   Resp 10   Ht '5\' 11"'$  (1.803 m)   Wt 75.4 kg   SpO2 95%   BMI 23.18 kg/m   Intake/Output Summary (Last 24 hours) at 01/31/2021 1418 Last data filed at 01/31/2021 0108 Gross per 24 hour  Intake 120 ml  Output 2000 ml  Net -1880 ml   Intake/Output: I/O last 3 completed shifts: In: 600 [P.O.:600] Out: 2000 [Other:2000]  Intake/Output this shift:  No intake/output data recorded. Weight change:  Gen: NAD CVS: RRR Resp: cta Abd: benign Ext: no edema,  Vascular access:  clotted LUE AVF, LIJ TDC in place  Recent Labs  Lab 01/24/21 1801 01/24/21 2328 01/25/21 0723 01/27/21 0256 01/30/21 2258  NA 134* 133* 136 135 130*  K 4.0 4.0 3.9 3.9 6.0*  CL 99 97* 102 101 95*  CO2 17* 19* 21* 22 20*  GLUCOSE 161* 153* 155* 85 331*  BUN 32* 34* 37* 37* 73*  CREATININE 7.15* 7.43* 7.71* 8.35* 11.39*  ALBUMIN  --   --   --  2.4* 2.5*  CALCIUM 7.6* 7.4* 7.2* 6.8* 5.3*  PHOS  --   --   --   --  4.8*  AST  --   --   --  27  --   ALT  --   --   --  32  --    Liver Function Tests: Recent Labs  Lab 01/27/21 0256 01/30/21 2258  AST 27  --   ALT 32  --   ALKPHOS 89  --   BILITOT 0.6  --   PROT 5.8*  --   ALBUMIN 2.4* 2.5*   No results for input(s): LIPASE, AMYLASE in the last 168 hours. No results for input(s): AMMONIA in the last 168 hours. CBC: Recent Labs  Lab 01/25/21 0647 01/26/21 0346 01/27/21 0256 01/30/21 2258  WBC 13.8* 9.3 6.7 3.6*  NEUTROABS 12.0* 7.8* 5.1  --   HGB 12.1* 11.1* 11.7* 12.1*  HCT 35.9* 32.7* 36.4* 39.0  MCV 89.5 89.3 91.5 93.5  PLT 146* 131* 154 169   Cardiac Enzymes: No results for input(s): CKTOTAL, CKMB, CKMBINDEX, TROPONINI in the last 168 hours. CBG: Recent Labs  Lab 01/30/21 1113 01/30/21 1635 01/30/21 2019 01/31/21 0524  01/31/21 1236  GLUCAP 103* 162* 180* 209* 280*    Iron Studies: No results for input(s): IRON, TIBC, TRANSFERRIN, FERRITIN in the last 72 hours. Studies/Results: No results found. . vitamin C  500 mg Oral Daily  . aspirin  324 mg Oral Once  . aspirin  81 mg Oral Daily  . atorvastatin  20 mg Oral Daily  . Chlorhexidine Gluconate Cloth  6 each Topical Q0600  . cholecalciferol  1,000 Units Oral Daily  . gabapentin  100 mg Oral TID  . heparin injection (subcutaneous)  5,000 Units Subcutaneous Q8H  . insulin aspart  0-5 Units Subcutaneous QHS  . insulin aspart  0-9 Units Subcutaneous TID WC  . insulin glargine  8 Units Subcutaneous Daily  . melatonin  3 mg Oral Once  . pantoprazole  20 mg Oral Daily  . sodium chloride flush  10-40 mL Intracatheter Q12H  . zinc sulfate  220 mg Oral Daily    BMET    Component Value Date/Time  NA 130 (L) 01/30/2021 2258   K 6.0 (H) 01/30/2021 2258   CL 95 (L) 01/30/2021 2258   CO2 20 (L) 01/30/2021 2258   GLUCOSE 331 (H) 01/30/2021 2258   BUN 73 (H) 01/30/2021 2258   CREATININE 11.39 (H) 01/30/2021 2258   CALCIUM 5.3 (LL) 01/30/2021 2258   GFRNONAA 5 (L) 01/30/2021 2258   CBC    Component Value Date/Time   WBC 3.6 (L) 01/30/2021 2258   RBC 4.17 (L) 01/30/2021 2258   HGB 12.1 (L) 01/30/2021 2258   HCT 39.0 01/30/2021 2258   PLT 169 01/30/2021 2258   MCV 93.5 01/30/2021 2258   MCH 29.0 01/30/2021 2258   MCHC 31.0 01/30/2021 2258   RDW 14.4 01/30/2021 2258   LYMPHSABS 1.0 01/27/2021 0256   MONOABS 0.5 01/27/2021 0256   EOSABS 0.1 01/27/2021 0256   BASOSABS 0.0 01/27/2021 0256      Dialysis Orders: TTS NW 4h 400/ 800 68.5kg 2/ 3.5Ca TDC Hep 4000 - hect 1ug - last HD 1/25  CXR 1/25 - IS edema bilat + patchy bilat infiltrates  Assessment/Plan: 1. DKA/DM 2- resolved, per pmd 2. COVID 19 pna - CXR w/ infiltrates, on O2 nasal cannula.  3. Syncope -related to #1 and #2 4. ^trop/ hx CAD- no further w/u per  cards 5. ESRD - TTS HD. Next HD will be Thursday. Treatments here are shortened due to high census/nursing shortage. 6. PAD -sp R BKA, bilat finger amps. Per pmd 7. BP/ volume -no vol excess on exam but above EDW. UF with HD as tolerated today.  8. MBD ckd - Corrected calcium slightly low. Use 3.5Ca bath with HD and continue vdra w/ HD 9. Anemia ckd - Hb 11.7, no ESA indicated at this time.  10. Dispo -ok for dc from renal standpoint, however caregiver no longer able to handle patient and will require placement. SW/CM and renal navigator involved.    Donetta Potts, MD [employer] [phone]

## 2021-01-31 NOTE — Progress Notes (Signed)
Patient to HD 

## 2021-01-31 NOTE — Progress Notes (Signed)
PROGRESS NOTE  Jose Robinson [id number] DOB: 11-04-1956 DOA: 01/23/2021 PCP: Pcp, No   LOS: 7 days   Brief Narrative / Interim history: 65yo with hx ESRD on TTS HD, CAD, PAD, chronic systolic chf, copd, DM who presented with generalized weakness, found to have glucose just under 500. While in ED, became unresponsive and hypotensive, requiring IVF resuscitation. Tested covid pos. CCM consulted with pt later requiring pressor support. Now off pressors and transferred to [facility]  Subjective / 24h Interval events: Doing well, no complaints  Assessment & Plan: Principal Problem Hyperglycemia, early DKA, insulin-dependent diabetes mellitus-DKA has resolved, patient is to be continued on subcutaneous insulin, glucose trends have been stable  CBG (last 3)  Recent Labs    01/30/21 2019 01/31/21 0524 01/31/21 1236  GLUCAP 180* 209* 280*   Active Problems COVID-19 viral infection-asymptomatic, per chest x-ray did show findings of pneumonia and he is status post Remdesivir.  Stable. Syncope-secondary to hypotension, resolved Lactic acidosis-resolved Elevated troponin, underlying CAD-high-sensitivity troponin 59, seen by cardiology, no further work-up recommended at this time. Elevated LFTs-due to COVID-19, trending down ESRD on HD TTS-nephrology following Chronic systolic CHF-volume management per dialysis COPD-no wheezing  Needs placement  Scheduled Meds: . vitamin C  500 mg Oral Daily  . aspirin  324 mg Oral Once  . aspirin  81 mg Oral Daily  . atorvastatin  20 mg Oral Daily  . Chlorhexidine Gluconate Cloth  6 each Topical Q0600  . cholecalciferol  1,000 Units Oral Daily  . gabapentin  100 mg Oral TID  . heparin injection (subcutaneous)  5,000 Units Subcutaneous Q8H  . insulin aspart  0-5 Units Subcutaneous QHS  . insulin aspart  0-9 Units Subcutaneous TID WC  . insulin glargine  8 Units Subcutaneous Daily  . melatonin  3 mg Oral Once  . pantoprazole  20 mg Oral Daily  .  sodium chloride flush  10-40 mL Intracatheter Q12H  . zinc sulfate  220 mg Oral Daily   Continuous Infusions: PRN Meds:.dextrose, guaiFENesin-codeine, heparin, hydrALAZINE, Ipratropium-Albuterol, oxyCODONE, sodium chloride flush  Diet Orders (From admission, onward)    Start     Ordered   01/24/21 0842  Diet Carb Modified Fluid consistency: Thin; Room service appropriate? Yes  Diet effective now       Question Answer Comment  Diet-HS Snack? Nothing   Calorie Level Medium 1600-2000   Fluid consistency: Thin   Room service appropriate? Yes      01/24/21 0841          DVT prophylaxis: heparin injection 5,000 Units Start: 01/25/21 1400     Code Status: Full Code  Family Communication: No family at bedside  Status is: Inpatient  Remains inpatient appropriate because:Unsafe d/c plan   Dispo: The patient is from: Home              Anticipated d/c is to: SNF              Anticipated d/c date is: 1 day              Patient currently is medically stable to d/c.   Difficult to place patient No   Level of care: Med-Surg  Consultants:  none  Procedures:  None   Microbiology  None   Antimicrobials: None     Objective: Vitals:   01/31/21 0108 01/31/21 0115 01/31/21 0500 01/31/21 0801  BP: 131/71 (!) (P) 144/60 120/63 126/70  Pulse:   90   Resp:   19 10  Temp:  (P) 97.7 F (36.5 C) 98.6 F (37 C) 98.7 F (37.1 C)  TempSrc:   Oral Oral  SpO2: 98%  96% 95%  Weight: 75.4 kg     Height:        Intake/Output Summary (Last 24 hours) at 01/31/2021 1422 Last data filed at 01/31/2021 0108 Gross per 24 hour  Intake 120 ml  Output 2000 ml  Net -1880 ml   Filed Weights   01/27/21 1042 01/30/21 2230 01/31/21 0108  Weight: 72 kg 77.4 kg 75.4 kg    Examination:  Constitutional: NAD Eyes: no scleral icterus ENMT: Mucous membranes are moist.  Neck: normal, supple Respiratory: clear to auscultation bilaterally, no wheezing, no crackles. Normal respiratory effort. No  accessory muscle use.  Cardiovascular: Regular rate and rhythm, no murmurs / rubs / gallops. No LE edema. Abdomen: non distended, no tenderness. Bowel sounds positive.  Musculoskeletal: no clubbing / cyanosis.  Skin: no rashes Neurologic: CN 2-12 grossly intact. Strength 5/5 in all 4.   Data Reviewed: I have independently reviewed following labs and imaging studies   CBC: Recent Labs  Lab 01/25/21 0647 01/26/21 0346 01/27/21 0256 01/30/21 2258  WBC 13.8* 9.3 6.7 3.6*  NEUTROABS 12.0* 7.8* 5.1  --   HGB 12.1* 11.1* 11.7* 12.1*  HCT 35.9* 32.7* 36.4* 39.0  MCV 89.5 89.3 91.5 93.5  PLT 146* 131* 154 [id number]   Basic Metabolic Panel: Recent Labs  Lab 01/24/21 1801 01/24/21 2328 01/25/21 0723 01/27/21 0256 01/30/21 2258  NA 134* 133* 136 135 130*  K 4.0 4.0 3.9 3.9 6.0*  CL 99 97* 102 101 95*  CO2 17* 19* 21* 22 20*  GLUCOSE 161* 153* 155* 85 331*  BUN 32* 34* 37* 37* 73*  CREATININE 7.15* 7.43* 7.71* 8.35* 11.39*  CALCIUM 7.6* 7.4* 7.2* 6.8* 5.3*  PHOS  --   --   --   --  4.8*   Liver Function Tests: Recent Labs  Lab 01/27/21 0256 01/30/21 2258  AST 27  --   ALT 32  --   ALKPHOS 89  --   BILITOT 0.6  --   PROT 5.8*  --   ALBUMIN 2.4* 2.5*   Coagulation Profile: No results for input(s): INR, PROTIME in the last 168 hours. HbA1C: No results for input(s): HGBA1C in the last 72 hours. CBG: Recent Labs  Lab 01/30/21 1113 01/30/21 1635 01/30/21 2019 01/31/21 0524 01/31/21 1236  GLUCAP 103* 162* 180* 209* 280*    Recent Results (from the past 240 hour(s))  SARS Coronavirus 2 by RT PCR (hospital order, performed in [facility] hospital lab) Nasopharyngeal Nasopharyngeal Swab     Status: Abnormal   Collection Time: 01/23/21  5:35 PM   Specimen: Nasopharyngeal Swab  Result Value Ref Range Status   SARS Coronavirus 2 POSITIVE (A) NEGATIVE Final    Comment: RESULT CALLED TO, READ BACK BY AND VERIFIED WITH: RN B ATKINS 2247 [id number] FCP (NOTE) SARS-CoV-2 target  nucleic acids are DETECTED  SARS-CoV-2 RNA is generally detectable in upper respiratory specimens  during the acute phase of infection.  Positive results are indicative  of the presence of the identified virus, but do not rule out bacterial infection or co-infection with other pathogens not detected by the test.  Clinical correlation with patient history and  other diagnostic information is necessary to determine patient infection status.  The expected result is negative.  Fact Sheet for Patients:   StrictlyIdeas.no   Fact Sheet for Healthcare Providers:  BankingDealers.co.za    This test is not yet approved or cleared by the [place] and  has been authorized for detection and/or diagnosis of SARS-CoV-2 by FDA under an Emergency Use Authorization (EUA).  This EUA will remain in effect (meaning this test can  be used) for the duration of  the COVID-19 declaration under Section 564(b)(1) of the Act, 21 U.S.C. section 360-bbb-3(b)(1), unless the authorization is terminated or revoked sooner.  Performed at [facility], [place] [address], [place], [place] [address]   Culture, blood (Routine X 2) w Reflex to ID Panel     Status: None   Collection Time: 01/24/21 11:27 PM   Specimen: BLOOD RIGHT HAND  Result Value Ref Range Status   Specimen Description BLOOD RIGHT HAND  Final   Special Requests   Final    AEROBIC BOTTLE ONLY Blood Culture results may not be optimal due to an inadequate volume of blood received in culture bottles   Culture   Final    NO GROWTH 5 DAYS Performed at [facility], [place] [address], [place], [place] [address]    Report Status 01/30/2021 FINAL  Final  Culture, blood (Routine X 2) w Reflex to ID Panel     Status: None   Collection Time: 01/24/21 11:35 PM   Specimen: BLOOD RIGHT FOREARM  Result Value Ref Range Status   Specimen Description BLOOD RIGHT FOREARM  Final   Special Requests   Final     AEROBIC BOTTLE ONLY Blood Culture results may not be optimal due to an inadequate volume of blood received in culture bottles   Culture   Final    NO GROWTH 5 DAYS Performed at [facility], [place] [address] [place], [place] [address]    Report Status 01/30/2021 FINAL  Final  MRSA PCR Screening     Status: None   Collection Time: 01/25/21 10:33 AM   Specimen: Nasal Mucosa; Nasopharyngeal  Result Value Ref Range Status   MRSA by PCR NEGATIVE NEGATIVE Final    Comment:        The GeneXpert MRSA Assay (FDA approved for NASAL specimens only), is one component of a comprehensive MRSA colonization surveillance program. It is not intended to diagnose MRSA infection nor to guide or monitor treatment for MRSA infections. Performed at [facility], [place] [address], [place], [place] [address]      Radiology Studies: No results found.   Marzetta Board, MD, PhD [facility]  Between 7 am - 7 pm I am available, please contact me via Amion or Securechat  Between 7 pm - 7 am I am not available, please contact night coverage MD/APP via Amion

## 2021-02-01 ENCOUNTER — Inpatient Hospital Stay (HOSPITAL_COMMUNITY): Payer: Medicaid Other

## 2021-02-01 LAB — GLUCOSE, CAPILLARY
Glucose-Capillary: 61 mg/dL — ABNORMAL LOW (ref 70–99)
Glucose-Capillary: 79 mg/dL (ref 70–99)
Glucose-Capillary: 174 mg/dL — ABNORMAL HIGH (ref 70–99)
Glucose-Capillary: 217 mg/dL — ABNORMAL HIGH (ref 70–99)
Glucose-Capillary: 210 mg/dL — ABNORMAL HIGH (ref 70–99)
Glucose-Capillary: 201 mg/dL — ABNORMAL HIGH (ref 70–99)

## 2021-02-01 MED ORDER — INSULIN GLARGINE 100 UNIT/ML ~~LOC~~ SOLN
6.0000 [IU] | Freq: Every day | SUBCUTANEOUS | Status: DC
  Administered 2021-02-02 – 2021-02-09 (×8): 6 [IU] via SUBCUTANEOUS
  Filled 2021-02-01 (×10): qty 0.06

## 2021-02-01 MED ORDER — HEPARIN SODIUM (PORCINE) 1000 UNIT/ML DIALYSIS
2000.0000 [IU] | INTRAMUSCULAR | Status: DC | PRN
  Administered 2021-02-03: 2000 [IU] via INTRAVENOUS_CENTRAL
  Filled 2021-02-01 (×2): qty 2

## 2021-02-01 MED ORDER — CALCIUM ACETATE (PHOS BINDER) 667 MG PO CAPS
1334.0000 mg | ORAL_CAPSULE | Freq: Three times a day (TID) | ORAL | Status: DC
  Administered 2021-02-01 – 2021-02-09 (×21): 1334 mg via ORAL
  Filled 2021-02-01 (×26): qty 2

## 2021-02-01 NOTE — Progress Notes (Signed)
PROGRESS NOTE  Jose Robinson [id number] DOB: 10/25/1956 DOA: 01/23/2021 PCP: Pcp, No   LOS: 8 days   Brief Narrative / Interim history: 64yo with hx ESRD on TTS HD, CAD, PAD, chronic systolic chf, copd, DM who presented with generalized weakness, found to have glucose just under 500. While in ED, became unresponsive and hypotensive, requiring IVF resuscitation. Tested covid pos. CCM consulted with pt later requiring pressor support. Now off pressors and transferred to [facility]  Subjective / 24h Interval events: No complaints this morning.  Upset that his friend will take him back to her home  Assessment & Plan: Principal Problem Hyperglycemia, early DKA, insulin-dependent diabetes mellitus-DKA has resolved, patient is to be continued on subcutaneous insulin, glucose trends have been stable.  Hypoglycemic at times, change lantus  CBG (last 3)  Recent Labs    02/01/21 0606 02/01/21 0651 02/01/21 1028  GLUCAP 61* 79 174*   Active Problems COVID-19 viral infection-asymptomatic, status post Remdesivir.  Stable.  Given no hypoxia did not get steroids, and few symptoms will only need 10 days of isolation.  Initially tested positive on 1/25 and will come off precautions on 2/5 Syncope-secondary to hypotension, resolved Lactic acidosis-resolved Elevated troponin, underlying CAD-high-sensitivity troponin 59, seen by cardiology, no further work-up recommended at this time. Elevated LFTs-due to COVID-19, trending down ESRD on HD TTS-nephrology following Chronic systolic CHF-volume management per dialysis COPD-no wheezing, respiratory status stable  Needs placement  Scheduled Meds: . vitamin C  500 mg Oral Daily  . aspirin  324 mg Oral Once  . aspirin  81 mg Oral Daily  . atorvastatin  20 mg Oral Daily  . Chlorhexidine Gluconate Cloth  6 each Topical Q0600  . cholecalciferol  1,000 Units Oral Daily  . gabapentin  100 mg Oral TID  . heparin injection (subcutaneous)  5,000 Units  Subcutaneous Q8H  . insulin aspart  0-5 Units Subcutaneous QHS  . insulin aspart  0-9 Units Subcutaneous TID WC  . insulin glargine  8 Units Subcutaneous Daily  . melatonin  3 mg Oral Once  . pantoprazole  20 mg Oral Daily  . sodium chloride flush  10-40 mL Intracatheter Q12H  . zinc sulfate  220 mg Oral Daily   Continuous Infusions: PRN Meds:.dextrose, guaiFENesin-codeine, heparin, hydrALAZINE, Ipratropium-Albuterol, oxyCODONE, sodium chloride flush  Diet Orders (From admission, onward)    Start     Ordered   01/24/21 0842  Diet Carb Modified Fluid consistency: Thin; Room service appropriate? Yes  Diet effective now       Question Answer Comment  Diet-HS Snack? Nothing   Calorie Level Medium 1600-2000   Fluid consistency: Thin   Room service appropriate? Yes      01/24/21 0841          DVT prophylaxis: heparin injection 5,000 Units Start: 01/25/21 1400     Code Status: Full Code  Family Communication: No family at bedside  Status is: Inpatient  Remains inpatient appropriate because:Unsafe d/c plan   Dispo: The patient is from: Home              Anticipated d/c is to: SNF              Anticipated d/c date is: 1 day              Patient currently is medically stable to d/c.   Difficult to place patient No   Level of care: Med-Surg  Consultants:  none  Procedures:  None   Microbiology  None   Antimicrobials: None     Objective: Vitals:   01/31/21 2100 02/01/21 0600 02/01/21 0636 02/01/21 1032  BP: (!) 94/58 (!) 86/51 106/65 108/90  Pulse:    81  Resp: 19   15  Temp: 98.7 F (37.1 C) 98.2 F (36.8 C)  98.2 F (36.8 C)  TempSrc: Oral Oral  Oral  SpO2: 94%   92%  Weight:      Height:        Intake/Output Summary (Last 24 hours) at 02/01/2021 1036 Last data filed at 02/01/2021 0900 Gross per 24 hour  Intake 480 ml  Output --  Net 480 ml   Filed Weights   01/27/21 1042 01/30/21 2230 01/31/21 0108  Weight: 72 kg 77.4 kg 75.4 kg     Examination:  Constitutional: No distress Eyes: No icterus ENMT: mmm Neck: normal, supple Respiratory: Clear bilaterally, no wheezing, no crackles, normal respiratory effort Cardiovascular: Regular rate and rhythm, no murmurs, no edema Abdomen: Soft, NT, ND, bowel sounds positive Musculoskeletal: no clubbing / cyanosis.  Skin: No rashes seen Neurologic: Nonfocal, equal strength  Data Reviewed: I have independently reviewed following labs and imaging studies   CBC: Recent Labs  Lab 01/26/21 0346 01/27/21 0256 01/30/21 2258  WBC 9.3 6.7 3.6*  NEUTROABS 7.8* 5.1  --   HGB 11.1* 11.7* 12.1*  HCT 32.7* 36.4* 39.0  MCV 89.3 91.5 93.5  PLT 131* 154 [id number]   Basic Metabolic Panel: Recent Labs  Lab 01/27/21 0256 01/30/21 2258  NA 135 130*  K 3.9 6.0*  CL 101 95*  CO2 22 20*  GLUCOSE 85 331*  BUN 37* 73*  CREATININE 8.35* 11.39*  CALCIUM 6.8* 5.3*  PHOS  --  4.8*   Liver Function Tests: Recent Labs  Lab 01/27/21 0256 01/30/21 2258  AST 27  --   ALT 32  --   ALKPHOS 89  --   BILITOT 0.6  --   PROT 5.8*  --   ALBUMIN 2.4* 2.5*   Coagulation Profile: No results for input(s): INR, PROTIME in the last 168 hours. HbA1C: No results for input(s): HGBA1C in the last 72 hours. CBG: Recent Labs  Lab 01/31/21 1711 01/31/21 2106 02/01/21 0606 02/01/21 0651 02/01/21 1028  GLUCAP 151* 122* 61* 79 174*    Recent Results (from the past 240 hour(s))  SARS Coronavirus 2 by RT PCR (hospital order, performed in [facility] hospital lab) Nasopharyngeal Nasopharyngeal Swab     Status: Abnormal   Collection Time: 01/23/21  5:35 PM   Specimen: Nasopharyngeal Swab  Result Value Ref Range Status   SARS Coronavirus 2 POSITIVE (A) NEGATIVE Final    Comment: RESULT CALLED TO, READ BACK BY AND VERIFIED WITH: RN B ATKINS 2247 [id number] FCP (NOTE) SARS-CoV-2 target nucleic acids are DETECTED  SARS-CoV-2 RNA is generally detectable in upper respiratory specimens  during the  acute phase of infection.  Positive results are indicative  of the presence of the identified virus, but do not rule out bacterial infection or co-infection with other pathogens not detected by the test.  Clinical correlation with patient history and  other diagnostic information is necessary to determine patient infection status.  The expected result is negative.  Fact Sheet for Patients:   StrictlyIdeas.no   Fact Sheet for Healthcare Providers:   BankingDealers.co.za    This test is not yet approved or cleared by the [place] FDA and  has been authorized for detection and/or diagnosis of SARS-CoV-2 by FDA under  an Emergency Use Authorization (EUA).  This EUA will remain in effect (meaning this test can  be used) for the duration of  the COVID-19 declaration under Section 564(b)(1) of the Act, 21 U.S.C. section 360-bbb-3(b)(1), unless the authorization is terminated or revoked sooner.  Performed at [facility], [place] [address], [place], [place] [address]   Culture, blood (Routine X 2) w Reflex to ID Panel     Status: None   Collection Time: 01/24/21 11:27 PM   Specimen: BLOOD RIGHT HAND  Result Value Ref Range Status   Specimen Description BLOOD RIGHT HAND  Final   Special Requests   Final    AEROBIC BOTTLE ONLY Blood Culture results may not be optimal due to an inadequate volume of blood received in culture bottles   Culture   Final    NO GROWTH 5 DAYS Performed at [facility], [place] [address] [place], [place] [address]    Report Status 01/30/2021 FINAL  Final  Culture, blood (Routine X 2) w Reflex to ID Panel     Status: None   Collection Time: 01/24/21 11:35 PM   Specimen: BLOOD RIGHT FOREARM  Result Value Ref Range Status   Specimen Description BLOOD RIGHT FOREARM  Final   Special Requests   Final    AEROBIC BOTTLE ONLY Blood Culture results may not be optimal due to an inadequate volume of blood received  in culture bottles   Culture   Final    NO GROWTH 5 DAYS Performed at [facility], [place] [address] [place], [place] [address]    Report Status 01/30/2021 FINAL  Final  MRSA PCR Screening     Status: None   Collection Time: 01/25/21 10:33 AM   Specimen: Nasal Mucosa; Nasopharyngeal  Result Value Ref Range Status   MRSA by PCR NEGATIVE NEGATIVE Final    Comment:        The GeneXpert MRSA Assay (FDA approved for NASAL specimens only), is one component of a comprehensive MRSA colonization surveillance program. It is not intended to diagnose MRSA infection nor to guide or monitor treatment for MRSA infections. Performed at [facility], [place] [address] [place], [place] [address]      Radiology Studies: DG Abd 2 Views  Result Date: 02/01/2021 CLINICAL DATA:  Abdominal pain.  Nausea.  COVID positive. EXAM: ABDOMEN - 2 VIEW COMPARISON:  No prior. FINDINGS: Soft tissue structures are unremarkable. Nonspecific air-filled loops of small bowel noted. No bowel distention. Stool noted throughout the colon. No free air. Vascular sheath noted with tip over the cavoatrial junction. Prior lumbar spine fusion. Mild lumbar scoliosis concave right. Degenerative changes lumbar spine and both hips. IMPRESSION: No acute intra-abdominal abnormality identified. No bowel distention. Stool noted throughout the colon. Electronically Signed   By: [name]  Register   On: 02/01/2021 08:33     Marzetta Board, MD, PhD [facility]  Between 7 am - 7 pm I am available, please contact me via Amion or Securechat  Between 7 pm - 7 am I am not available, please contact night coverage MD/APP via Amion

## 2021-02-01 NOTE — Progress Notes (Addendum)
Patient ID: Jose Robinson, male   DOB: 01/09/1956, 65 y.o.   MRN: [id number] S: No new complaints O:BP 108/90 (BP Location: Left Arm)   Pulse 81   Temp 98.2 F (36.8 C) (Oral)   Resp 15   Ht '5\' 11"'$  (1.803 m)   Wt 75.4 kg   SpO2 92%   BMI 23.18 kg/m   Intake/Output Summary (Last 24 hours) at 02/01/2021 1205 Last data filed at 02/01/2021 0900 Gross per 24 hour  Intake 480 ml  Output --  Net 480 ml   Intake/Output: I/O last 3 completed shifts: In: 600 [P.O.:600] Out: 2000 [Other:2000]  Intake/Output this shift:  Total I/O In: 240 [P.O.:240] Out: -  Weight change:  Gen: NAD CVS: RRR Resp: CTA Abd: benign Ext: no edema  Recent Labs  Lab 01/27/21 0256 01/30/21 2258  NA 135 130*  K 3.9 6.0*  CL 101 95*  CO2 22 20*  GLUCOSE 85 331*  BUN 37* 73*  CREATININE 8.35* 11.39*  ALBUMIN 2.4* 2.5*  CALCIUM 6.8* 5.3*  PHOS  --  4.8*  AST 27  --   ALT 32  --    Liver Function Tests: Recent Labs  Lab 01/27/21 0256 01/30/21 2258  AST 27  --   ALT 32  --   ALKPHOS 89  --   BILITOT 0.6  --   PROT 5.8*  --   ALBUMIN 2.4* 2.5*   No results for input(s): LIPASE, AMYLASE in the last 168 hours. No results for input(s): AMMONIA in the last 168 hours. CBC: Recent Labs  Lab 01/26/21 0346 01/27/21 0256 01/30/21 2258  WBC 9.3 6.7 3.6*  NEUTROABS 7.8* 5.1  --   HGB 11.1* 11.7* 12.1*  HCT 32.7* 36.4* 39.0  MCV 89.3 91.5 93.5  PLT 131* 154 169   Cardiac Enzymes: No results for input(s): CKTOTAL, CKMB, CKMBINDEX, TROPONINI in the last 168 hours. CBG: Recent Labs  Lab 01/31/21 1711 01/31/21 2106 02/01/21 0606 02/01/21 0651 02/01/21 1028  GLUCAP 151* 122* 61* 79 174*    Iron Studies: No results for input(s): IRON, TIBC, TRANSFERRIN, FERRITIN in the last 72 hours. Studies/Results: DG Abd 2 Views  Result Date: 02/01/2021 CLINICAL DATA:  Abdominal pain.  Nausea.  COVID positive. EXAM: ABDOMEN - 2 VIEW COMPARISON:  No prior. FINDINGS: Soft tissue structures are  unremarkable. Nonspecific air-filled loops of small bowel noted. No bowel distention. Stool noted throughout the colon. No free air. Vascular sheath noted with tip over the cavoatrial junction. Prior lumbar spine fusion. Mild lumbar scoliosis concave right. Degenerative changes lumbar spine and both hips. IMPRESSION: No acute intra-abdominal abnormality identified. No bowel distention. Stool noted throughout the colon. Electronically Signed   By: [name]  Register   On: 02/01/2021 08:33   . vitamin C  500 mg Oral Daily  . aspirin  324 mg Oral Once  . aspirin  81 mg Oral Daily  . atorvastatin  20 mg Oral Daily  . calcium acetate  1,334 mg Oral TID WC  . Chlorhexidine Gluconate Cloth  6 each Topical Q0600  . cholecalciferol  1,000 Units Oral Daily  . gabapentin  100 mg Oral TID  . heparin injection (subcutaneous)  5,000 Units Subcutaneous Q8H  . insulin aspart  0-5 Units Subcutaneous QHS  . insulin aspart  0-9 Units Subcutaneous TID WC  . [START ON 02/02/2021] insulin glargine  6 Units Subcutaneous Daily  . melatonin  3 mg Oral Once  . pantoprazole  20 mg Oral Daily  .  sodium chloride flush  10-40 mL Intracatheter Q12H  . zinc sulfate  220 mg Oral Daily    BMET    Component Value Date/Time   NA 130 (L) 01/30/2021 2258   K 6.0 (H) 01/30/2021 2258   CL 95 (L) 01/30/2021 2258   CO2 20 (L) 01/30/2021 2258   GLUCOSE 331 (H) 01/30/2021 2258   BUN 73 (H) 01/30/2021 2258   CREATININE 11.39 (H) 01/30/2021 2258   CALCIUM 5.3 (LL) 01/30/2021 2258   GFRNONAA 5 (L) 01/30/2021 2258   CBC    Component Value Date/Time   WBC 3.6 (L) 01/30/2021 2258   RBC 4.17 (L) 01/30/2021 2258   HGB 12.1 (L) 01/30/2021 2258   HCT 39.0 01/30/2021 2258   PLT 169 01/30/2021 2258   MCV 93.5 01/30/2021 2258   MCH 29.0 01/30/2021 2258   MCHC 31.0 01/30/2021 2258   RDW 14.4 01/30/2021 2258   LYMPHSABS 1.0 01/27/2021 0256   MONOABS 0.5 01/27/2021 0256   EOSABS 0.1 01/27/2021 0256   BASOSABS 0.0 01/27/2021 0256     Dialysis Orders: TTS NW 4h 400/ 800 68.5kg 2/ 3.5Ca TDC Hep 4000 - hect 1ug - last HD 1/25  CXR 1/25 - IS edema bilat + patchy bilat infiltrates  Assessment/Plan: 1. DKA/DM 2- resolved, per pmd 2. COVID 19 pna - CXR w/ infiltrates, on O2 nasal cannula.  3. Syncope -related to #1 and #2 4. ^trop/ hx CAD- no further w/u per cards 5. ESRD - TTS HD. Next HD will be today. Treatments here are shortened due to high census/nursing shortage. 6. PAD -sp R BKA, bilat finger amps. Per pmd 7. BP/ volume -no vol excess on exambut above EDW. UF with HD as tolerated today. 8. MBD ckd -Corrected calcium slightly low. Use 3.5Ca bath with HD andcontinuevdra w/ HD 9. Anemia ckd - Hb 11.7,no ESA indicated at this time. 10. Dispo -ok for dc from renal standpoint, however caregiver no longer able to handle patient and will require placement. SW/CM and renal navigator involved.    Donetta Potts, MD [employer] [phone]

## 2021-02-01 NOTE — Plan of Care (Addendum)
Pt has no PIV access. Pt refused to have new PIV placed. MD notified.   Problem: Health Behavior/Discharge Planning: Goal: Ability to manage health-related needs will improve Outcome: Progressing   Problem: Clinical Measurements: Goal: Ability to maintain clinical measurements within normal limits will improve Outcome: Progressing   Problem: Nutrition: Goal: Adequate nutrition will be maintained Outcome: Progressing   Problem: Coping: Goal: Level of anxiety will decrease Outcome: Progressing   Problem: Elimination: Goal: Will not experience complications related to bowel motility Outcome: Progressing   Problem: Pain Managment: Goal: General experience of comfort will improve Outcome: Progressing   Problem: Safety: Goal: Ability to remain free from injury will improve Outcome: Progressing   Problem: Skin Integrity: Goal: Risk for impaired skin integrity will decrease Outcome: Progressing

## 2021-02-01 NOTE — Plan of Care (Signed)
  Problem: Education: Goal: Knowledge of General Education information will improve Description: Including pain rating scale, medication(s)/side effects and non-pharmacologic comfort measures Outcome: Progressing   Problem: Health Behavior/Discharge Planning: Goal: Ability to manage health-related needs will improve Outcome: Progressing   Problem: Clinical Measurements: Goal: Ability to maintain clinical measurements within normal limits will improve Outcome: Progressing Goal: Will remain free from infection Outcome: Progressing Goal: Diagnostic test results will improve Outcome: Progressing Goal: Respiratory complications will improve Outcome: Progressing Goal: Cardiovascular complication will be avoided Outcome: Progressing   Problem: Activity: Goal: Risk for activity intolerance will decrease Outcome: Progressing   Problem: Nutrition: Goal: Adequate nutrition will be maintained Outcome: Progressing   Problem: Coping: Goal: Level of anxiety will decrease Outcome: Progressing   Problem: Elimination: Goal: Will not experience complications related to bowel motility Outcome: Progressing Goal: Will not experience complications related to urinary retention Outcome: Progressing   Problem: Pain Managment: Goal: General experience of comfort will improve Outcome: Progressing   Problem: Safety: Goal: Ability to remain free from injury will improve Outcome: Progressing   Problem: Skin Integrity: Goal: Risk for impaired skin integrity will decrease Outcome: Progressing   Problem: Education: Goal: Ability to describe self-care measures that may prevent or decrease complications (Diabetes Survival Skills Education) will improve Outcome: Progressing Goal: Individualized Educational Video(s) Outcome: Progressing   Problem: Cardiac: Goal: Ability to maintain an adequate cardiac output will improve Outcome: Progressing   Problem: Health Behavior/Discharge  Planning: Goal: Ability to identify and utilize available resources and services will improve Outcome: Progressing Goal: Ability to manage health-related needs will improve Outcome: Progressing   Problem: Fluid Volume: Goal: Ability to achieve a balanced intake and output will improve Outcome: Progressing   Problem: Metabolic: Goal: Ability to maintain appropriate glucose levels will improve Outcome: Progressing   Problem: Nutritional: Goal: Maintenance of adequate nutrition will improve Outcome: Progressing Goal: Maintenance of adequate weight for body size and type will improve Outcome: Progressing

## 2021-02-01 NOTE — Progress Notes (Signed)
Physical Therapy Treatment Patient Details Name: Jose Robinson MRN: [id number] DOB: 01-07-1956 Today's Date: 02/01/2021    History of Present Illness 65yo with hx ESRD on TTS HD, CAD, PAD, chronic systolic chf, copd, DM, R BKA who presented with generalized weakness, found to have glucose just under 500. While in ED, became unresponsive and hypotensive, requiring IVF resuscitation. Tested covid pos.    PT Comments    Pt seated in recliner without prosthesis donned as nursing was unable to apply prosthetic limb.  PTA assisted patient in application and patient then able to progress out of recliner into standing and progress to gt training in hall.  Pt continues to require assistance for transfers and gt training.  Currently he does not have a residence or place to go for HHPT but CM is aware and addressing this manner.    Follow Up Recommendations  Home health PT;Supervision for mobility/OOB     Equipment Recommendations  None recommended by PT    Recommendations for Other Services       Precautions / Restrictions Precautions Precautions: Fall Required Braces or Orthoses: Other Brace Other Brace: R prosthesis Restrictions Weight Bearing Restrictions: No    Mobility  Bed Mobility               General bed mobility comments: Pt seated in recliner on arrival with legs elevated.  Transfers Overall transfer level: Needs assistance Equipment used: Rolling walker (2 wheeled) Transfers: Sit to/from [facility] Sit to Stand: Mod assist         General transfer comment: Mod assistance to rise from recliner chair into standing.  Pt required facilitation for forward weight shifitng and assistance to boost into standing.  Ambulation/Gait Ambulation/Gait assistance: Min guard Gait Distance (Feet): 120 Feet Assistive device: Rolling walker (2 wheeled) Gait Pattern/deviations: Step-through pattern;Decreased stride length;Trunk flexed     General Gait  Details: Cues for upper trunk control and forward gaze.  Assistance for turns and backing.  Pt required assistance to guide RW at times and stay close to device.   Stairs             Wheelchair Mobility    Modified Rankin (Stroke Patients Only)       Balance Overall balance assessment: Needs assistance Sitting-balance support: Single extremity supported;Feet supported Sitting balance-Leahy Scale: Fair Sitting balance - Comments: EOB without UE support   Standing balance support: Bilateral upper extremity supported Standing balance-Leahy Scale: Poor Standing balance comment: RW for standing and gait                            Cognition Arousal/Alertness: Awake/alert Behavior During Therapy: WFL for tasks assessed/performed Overall Cognitive Status: Within Functional Limits for tasks assessed                                        Exercises General Exercises - Lower Extremity Long Arc Quad: AROM;Both;Seated;20 reps Hip ABduction/ADduction: AROM;Both;Seated;20 reps Hip Flexion/Marching: AROM;Both;Seated;20 reps Other Exercises Other Exercises: chair push ups 2x5 reps.  Min to mod assistance with forward weight shifting cues.    General Comments        Pertinent Vitals/Pain Pain Assessment: No/denies pain    Home Living                      Prior Function  PT Goals (current goals can now be found in the care plan section) Acute Rehab PT Goals Patient Stated Goal: return home Potential to Achieve Goals: Good Progress towards PT goals: Progressing toward goals    Frequency    Min 3X/week      PT Plan Current plan remains appropriate    Co-evaluation              AM-PAC PT "6 Clicks" Mobility   Outcome Measure  Help needed turning from your back to your side while in a flat bed without using bedrails?: None Help needed moving from lying on your back to sitting on the side of a flat bed without  using bedrails?: A Little Help needed moving to and from a bed to a chair (including a wheelchair)?: A Little Help needed standing up from a chair using your arms (e.g., wheelchair or bedside chair)?: A Little Help needed to walk in hospital room?: A Little Help needed climbing 3-5 steps with a railing? : A Lot 6 Click Score: 18    End of Session Equipment Utilized During Treatment: Gait belt Activity Tolerance: Patient tolerated treatment well Patient left: in chair;with call bell/phone within reach Nurse Communication: Mobility status PT Visit Diagnosis: Unsteadiness on feet (R26.81);Muscle weakness (generalized) (M62.81);Difficulty in walking, not elsewhere classified (R26.2)     Time: [id number] PT Time Calculation (min) (ACUTE ONLY): 27 min  Charges:  $Gait Training: 8-22 mins $Therapeutic Exercise: 8-22 mins                     Jose Robinson , PTA Acute Rehabilitation Services Pager [phone] Office [phone]     Jose Robinson Jose Robinson 02/01/2021, 6:17 PM

## 2021-02-02 LAB — COMPREHENSIVE METABOLIC PANEL
ALT: 29 U/L (ref 0–44)
AST: 26 U/L (ref 15–41)
Albumin: 2.4 g/dL — ABNORMAL LOW (ref 3.5–5.0)
Alkaline Phosphatase: 98 U/L (ref 38–126)
Anion gap: 16 — ABNORMAL HIGH (ref 5–15)
BUN: 69 mg/dL — ABNORMAL HIGH (ref 8–23)
CO2: 20 mmol/L — ABNORMAL LOW (ref 22–32)
Calcium: 5.7 mg/dL — CL (ref 8.9–10.3)
Chloride: 97 mmol/L — ABNORMAL LOW (ref 98–111)
Creatinine, Ser: 10.22 mg/dL — ABNORMAL HIGH (ref 0.61–1.24)
GFR, Estimated: 5 mL/min — ABNORMAL LOW (ref >60)
Glucose, Bld: 163 mg/dL — ABNORMAL HIGH (ref 70–99)
Potassium: 5.6 mmol/L — ABNORMAL HIGH (ref 3.5–5.1)
Sodium: 133 mmol/L — ABNORMAL LOW (ref 135–145)
Total Bilirubin: 0.8 mg/dL (ref 0.3–1.2)
Total Protein: 5.9 g/dL — ABNORMAL LOW (ref 6.5–8.1)

## 2021-02-02 LAB — CBC
HCT: 35.8 % — ABNORMAL LOW (ref 39.0–52.0)
Hemoglobin: 11 g/dL — ABNORMAL LOW (ref 13.0–17.0)
MCH: 29.1 pg (ref 26.0–34.0)
MCHC: 30.7 g/dL (ref 30.0–36.0)
MCV: 94.7 fL (ref 80.0–100.0)
Platelets: 181 10*3/uL (ref 150–400)
RBC: 3.78 MIL/uL — ABNORMAL LOW (ref 4.22–5.81)
RDW: 14.4 % (ref 11.5–15.5)
WBC: 3.7 10*3/uL — ABNORMAL LOW (ref 4.0–10.5)
nRBC: 0 % (ref 0.0–0.2)

## 2021-02-02 LAB — GLUCOSE, CAPILLARY
Glucose-Capillary: 90 mg/dL (ref 70–99)
Glucose-Capillary: 251 mg/dL — ABNORMAL HIGH (ref 70–99)
Glucose-Capillary: 111 mg/dL — ABNORMAL HIGH (ref 70–99)
Glucose-Capillary: 174 mg/dL — ABNORMAL HIGH (ref 70–99)

## 2021-02-02 LAB — HEPATIC FUNCTION PANEL
ALT: 30 U/L (ref 0–44)
AST: 25 U/L (ref 15–41)
Albumin: 2.4 g/dL — ABNORMAL LOW (ref 3.5–5.0)
Alkaline Phosphatase: 97 U/L (ref 38–126)
Bilirubin, Direct: <0.1 mg/dL (ref 0.0–0.2)
Total Bilirubin: 0.9 mg/dL (ref 0.3–1.2)
Total Protein: 6.1 g/dL — ABNORMAL LOW (ref 6.5–8.1)

## 2021-02-02 LAB — LIPASE, BLOOD: Lipase: 29 U/L (ref 11–51)

## 2021-02-02 LAB — PHOSPHORUS: Phosphorus: 4.5 mg/dL (ref 2.5–4.6)

## 2021-02-02 MED ORDER — CALCIUM CARBONATE 1250 (500 CA) MG PO TABS
1250.0000 mg | ORAL_TABLET | Freq: Every evening | ORAL | Status: DC
Start: 2021-02-02 — End: 2021-02-09
  Administered 2021-02-02 – 2021-02-08 (×7): 1250 mg via ORAL
  Filled 2021-02-02 (×7): qty 1

## 2021-02-02 MED ORDER — CALCITRIOL 0.5 MCG PO CAPS
0.5000 ug | ORAL_CAPSULE | Freq: Once | ORAL | Status: AC
  Administered 2021-02-02: 0.5 ug via ORAL
  Filled 2021-02-02: qty 1

## 2021-02-02 MED ORDER — DOXERCALCIFEROL 4 MCG/2ML IV SOLN
2.0000 ug | INTRAVENOUS | Status: DC
  Administered 2021-02-03 – 2021-02-08 (×2): 2 ug via INTRAVENOUS
  Filled 2021-02-02 (×2): qty 2

## 2021-02-02 NOTE — Progress Notes (Signed)
Physical Therapy Treatment Patient Details Name: Jose Robinson MRN: [id number] DOB: [birth year] Today's Date: 02/02/2021    History of Present Illness 65yo with hx ESRD on TTS HD, CAD, PAD, chronic systolic chf, copd, DM, R BKA who presented with generalized weakness, found to have glucose just under 500. While in ED, became unresponsive and hypotensive, requiring IVF resuscitation. Tested covid pos.    PT Comments    Pt supine in bed on arrival.  Pt required max encouragement to participate in PT session.  Pt continues to be totally dependent in efforts to apply prosthesis and to perform pericare.  Pt has very poor hand dexterity and will need an aide of some sort at home for self care tasks and donning prosthesis.  Pt continue to benefit from HHPT at d/c.  He currently has no plan of residence at this time.    Follow Up Recommendations  Home health PT;Supervision for mobility/OOB     Equipment Recommendations  None recommended by PT    Recommendations for Other Services       Precautions / Restrictions Precautions Precautions: Fall Required Braces or Orthoses: Other Brace Other Brace: R prosthesis Restrictions Weight Bearing Restrictions: No    Mobility  Bed Mobility Overal bed mobility: Needs Assistance Bed Mobility: Supine to Sit   Sidelying to sit: Min guard       General bed mobility comments: Increased time and effort to rise into sitting.  Cues for hand and foot placement to improve ease.  Transfers Overall transfer level: Needs assistance Equipment used: Rolling walker (2 wheeled) Transfers: Sit to/from Stand Sit to Stand: Mod assist         General transfer comment: Mod assistance to rise from bed into standing.  Pt required facilitation for forward weight shifitng and assistance to boost into standing.  Pt continues with poor eccentric load back to seated surface.  Ambulation/Gait Ambulation/Gait assistance: Min assist Gait Distance (Feet): 130  Feet Assistive device: Rolling walker (2 wheeled) Gait Pattern/deviations: Step-through pattern;Decreased stride length;Trunk flexed     General Gait Details: Pt required increased assistance.  More flexed posture noted today and patient very fatigued during session.  Pt require multiple standing rest breaks and re-adjustment of RW pathway.   Stairs             Wheelchair Mobility    Modified Rankin (Stroke Patients Only)       Balance Overall balance assessment: Needs assistance Sitting-balance support: Single extremity supported;Feet supported Sitting balance-Leahy Scale: Fair Sitting balance - Comments: EOB without UE support   Standing balance support: Bilateral upper extremity supported Standing balance-Leahy Scale: Poor Standing balance comment: RW for standing and gait                            Cognition Arousal/Alertness: Awake/alert Behavior During Therapy: WFL for tasks assessed/performed Overall Cognitive Status: Within Functional Limits for tasks assessed                                        Exercises General Exercises - Lower Extremity Ankle Circles/Pumps: Supine    General Comments        Pertinent Vitals/Pain Pain Assessment: No/denies pain    Home Living                      Prior Function  PT Goals (current goals can now be found in the care plan section) Acute Rehab PT Goals Patient Stated Goal: return home Potential to Achieve Goals: Good Progress towards PT goals: Progressing toward goals    Frequency    Min 3X/week      PT Plan Current plan remains appropriate    Co-evaluation              AM-PAC PT "6 Clicks" Mobility   Outcome Measure  Help needed turning from your back to your side while in a flat bed without using bedrails?: None Help needed moving from lying on your back to sitting on the side of a flat bed without using bedrails?: None Help needed moving  to and from a bed to a chair (including a wheelchair)?: A Little Help needed standing up from a chair using your arms (e.g., wheelchair or bedside chair)?: A Little Help needed to walk in hospital room?: A Little Help needed climbing 3-5 steps with a railing? : A Lot 6 Click Score: 19    End of Session Equipment Utilized During Treatment: Gait belt Activity Tolerance: Patient tolerated treatment well Patient left: in chair;with call bell/phone within reach Nurse Communication: Mobility status PT Visit Diagnosis: Unsteadiness on feet (R26.81);Muscle weakness (generalized) (M62.81);Difficulty in walking, not elsewhere classified (R26.2)     Time: 1201-1232 PT Time Calculation (min) (ACUTE ONLY): 31 min  Charges:  $Gait Training: 8-22 mins $Therapeutic Activity: 8-22 mins                     [name] , PTA Acute Rehabilitation Services Pager [phone] Office [phone]     Rolf Fells [name] 02/02/2021, 1:41 PM

## 2021-02-02 NOTE — Progress Notes (Addendum)
Patient ID: SINAI RIVENBURGH, male   DOB: [birth year], 65 y.o.   MRN: [id number] S: Had HD late last night/early this morning. O:BP 116/61 (BP Location: Left Arm)   Pulse 92   Temp 98.5 F (36.9 C) (Oral)   Resp 17   Ht '5\' 11"'$  (1.803 m)   Wt 77.8 kg   SpO2 100%   BMI 23.92 kg/m   Intake/Output Summary (Last 24 hours) at 02/02/2021 1128 Last data filed at 02/02/2021 0924 Gross per 24 hour  Intake 680 ml  Output 1513 ml  Net -833 ml   Intake/Output: I/O last 3 completed shifts: In: 680 [P.O.:680] Out: 1513 [Other:1513]  Intake/Output this shift:  Total I/O In: 240 [P.O.:240] Out: -  Weight change:  Physical exam: unable to complete due to COVID + status.  In order to preserve PPE equipment and to minimize exposure to providers.  Notes from other caregivers reviewed   Recent Labs  Lab 01/27/21 0256 01/30/21 2258 02/02/21 0050 02/02/21 0053  NA 135 130* 133*  --   K 3.9 6.0* 5.6*  --   CL 101 95* 97*  --   CO2 22 20* 20*  --   GLUCOSE 85 331* 163*  --   BUN 37* 73* 69*  --   CREATININE 8.35* 11.39* 10.22*  --   ALBUMIN 2.4* 2.5* 2.4* 2.4*  CALCIUM 6.8* 5.3* 5.7*  --   PHOS  --  4.8* 4.5  --   AST 27  --  26 25  ALT 32  --  29 30   Liver Function Tests: Recent Labs  Lab 01/27/21 0256 01/30/21 2258 02/02/21 0050 02/02/21 0053  AST 27  --  26 25  ALT 32  --  29 30  ALKPHOS 89  --  98 97  BILITOT 0.6  --  0.8 0.9  PROT 5.8*  --  5.9* 6.1*  ALBUMIN 2.4* 2.5* 2.4* 2.4*   Recent Labs  Lab 02/02/21 0050  LIPASE 29   No results for input(s): AMMONIA in the last 168 hours. CBC: Recent Labs  Lab 01/27/21 0256 01/30/21 2258 02/02/21 0050  WBC 6.7 3.6* 3.7*  NEUTROABS 5.1  --   --   HGB 11.7* 12.1* 11.0*  HCT 36.4* 39.0 35.8*  MCV 91.5 93.5 94.7  PLT 154 169 181   Cardiac Enzymes: No results for input(s): CKTOTAL, CKMB, CKMBINDEX, TROPONINI in the last 168 hours. CBG: Recent Labs  Lab 02/01/21 1028 02/01/21 1629 02/01/21 2017 02/01/21 2128  02/02/21 0633  GLUCAP 174* 217* 210* 201* 90    Iron Studies: No results for input(s): IRON, TIBC, TRANSFERRIN, FERRITIN in the last 72 hours. Studies/Results: DG Abd 2 Views  Result Date: 02/01/2021 CLINICAL DATA:  Abdominal pain.  Nausea.  COVID positive. EXAM: ABDOMEN - 2 VIEW COMPARISON:  No prior. FINDINGS: Soft tissue structures are unremarkable. Nonspecific air-filled loops of small bowel noted. No bowel distention. Stool noted throughout the colon. No free air. Vascular sheath noted with tip over the cavoatrial junction. Prior lumbar spine fusion. Mild lumbar scoliosis concave right. Degenerative changes lumbar spine and both hips. IMPRESSION: No acute intra-abdominal abnormality identified. No bowel distention. Stool noted throughout the colon. Electronically Signed   By: [name]  Register   On: 02/01/2021 08:33   . vitamin C  500 mg Oral Daily  . aspirin  81 mg Oral Daily  . atorvastatin  20 mg Oral Daily  . calcium acetate  1,334 mg Oral TID WC  . Chlorhexidine  Gluconate Cloth  6 each Topical [id number]  . cholecalciferol  1,000 Units Oral Daily  . gabapentin  100 mg Oral TID  . heparin injection (subcutaneous)  5,000 Units Subcutaneous Q8H  . insulin aspart  0-5 Units Subcutaneous QHS  . insulin aspart  0-9 Units Subcutaneous TID WC  . insulin glargine  6 Units Subcutaneous Daily  . melatonin  3 mg Oral Once  . pantoprazole  20 mg Oral Daily  . sodium chloride flush  10-40 mL Intracatheter Q12H  . zinc sulfate  220 mg Oral Daily    BMET    Component Value Date/Time   NA 133 (L) 02/02/2021 0050   K 5.6 (H) 02/02/2021 0050   CL 97 (L) 02/02/2021 0050   CO2 20 (L) 02/02/2021 0050   GLUCOSE 163 (H) 02/02/2021 0050   BUN 69 (H) 02/02/2021 0050   CREATININE 10.22 (H) 02/02/2021 0050   CALCIUM 5.7 (LL) 02/02/2021 0050   GFRNONAA 5 (L) 02/02/2021 0050   CBC    Component Value Date/Time   WBC 3.7 (L) 02/02/2021 0050   RBC 3.78 (L) 02/02/2021 0050   HGB 11.0 (L) 02/02/2021  0050   HCT 35.8 (L) 02/02/2021 0050   PLT 181 02/02/2021 0050   MCV 94.7 02/02/2021 0050   MCH 29.1 02/02/2021 0050   MCHC 30.7 02/02/2021 0050   RDW 14.4 02/02/2021 0050   LYMPHSABS 1.0 01/27/2021 0256   MONOABS 0.5 01/27/2021 0256   EOSABS 0.1 01/27/2021 0256   BASOSABS 0.0 01/27/2021 0256     Dialysis Orders: TTS NW 4h 400/ 800 68.5kg 2/ 3.5Ca TDC Hep 4000 - hect 1ug - last HD 1/25  CXR 1/25 - IS edema bilat + patchy bilat infiltrates  Assessment/Plan: 1. DKA/DM 2- resolved, per pmd 2. COVID 19 pna - CXR w/ infiltrates, on O2 nasal cannula.  Per primary he can come off of isolation precautions tomorrow.  3. Syncope -related to #1 and #2 4. ^trop/ hx CAD- no further w/u per cards 5. ESRD - TTS HD. Treatments here are shortened due to high census/nursing shortage. 6. PAD -sp R BKA, bilat finger amps. Per pmd 7. BP/ volume -no vol excess on exambut above EDW. UF with HD as tolerated today. 8. MBD ckd -Corrected calcium slightly low. Use 3.5Ca bath with HD andcontinuevdra w/ HD.  Will also add calcium carbonate qhs and follow calcium levels.  Will ^ hectoral to 54mg 9. Anemia ckd - Hb 11.7,no ESA indicated at this time. 10. Dispo -ok for dc from renal standpoint, however caregiver no longer able to handle patient and will require placement. SW/CM and renal navigator involved.   JDonetta Potts MD [employer]([phone]

## 2021-02-02 NOTE — Plan of Care (Signed)
  Problem: Education: Goal: Knowledge of General Education information will improve Description: Including pain rating scale, medication(s)/side effects and non-pharmacologic comfort measures Outcome: Progressing   Problem: Health Behavior/Discharge Planning: Goal: Ability to manage health-related needs will improve Outcome: Progressing   Problem: Clinical Measurements: Goal: Ability to maintain clinical measurements within normal limits will improve Outcome: Progressing Goal: Will remain free from infection Outcome: Progressing Goal: Diagnostic test results will improve Outcome: Progressing Goal: Respiratory complications will improve Outcome: Progressing Goal: Cardiovascular complication will be avoided Outcome: Progressing   Problem: Activity: Goal: Risk for activity intolerance will decrease Outcome: Progressing   Problem: Nutrition: Goal: Adequate nutrition will be maintained Outcome: Progressing   Problem: Coping: Goal: Level of anxiety will decrease Outcome: Progressing   Problem: Elimination: Goal: Will not experience complications related to bowel motility Outcome: Progressing Goal: Will not experience complications related to urinary retention Outcome: Progressing   Problem: Pain Managment: Goal: General experience of comfort will improve Outcome: Progressing   Problem: Safety: Goal: Ability to remain free from injury will improve Outcome: Progressing   Problem: Skin Integrity: Goal: Risk for impaired skin integrity will decrease Outcome: Progressing   Problem: Education: Goal: Ability to describe self-care measures that may prevent or decrease complications (Diabetes Survival Skills Education) will improve Outcome: Progressing Goal: Individualized Educational Video(s) Outcome: Progressing   Problem: Cardiac: Goal: Ability to maintain an adequate cardiac output will improve Outcome: Progressing   Problem: Health Behavior/Discharge  Planning: Goal: Ability to identify and utilize available resources and services will improve Outcome: Progressing Goal: Ability to manage health-related needs will improve Outcome: Progressing   Problem: Fluid Volume: Goal: Ability to achieve a balanced intake and output will improve Outcome: Progressing   Problem: Metabolic: Goal: Ability to maintain appropriate glucose levels will improve Outcome: Progressing   Problem: Nutritional: Goal: Maintenance of adequate nutrition will improve Outcome: Progressing Goal: Maintenance of adequate weight for body size and type will improve Outcome: Progressing

## 2021-02-02 NOTE — Plan of Care (Signed)

## 2021-02-02 NOTE — Plan of Care (Signed)
°  Problem: Clinical Measurements: °Goal: Ability to maintain clinical measurements within normal limits will improve °Outcome: Progressing °Goal: Will remain free from infection °Outcome: Progressing °Goal: Diagnostic test results will improve °Outcome: Progressing °Goal: Respiratory complications will improve °Outcome: Progressing °  °

## 2021-02-02 NOTE — Progress Notes (Signed)
PROGRESS NOTE  Jose Robinson [id number] DOB: 1956-09-26 DOA: 01/23/2021 PCP: Pcp, No   LOS: 9 days   Brief Narrative / Interim history: 64yo with hx ESRD on TTS HD, CAD, PAD, chronic systolic chf, copd, DM who presented with generalized weakness, found to have glucose just under 500. While in ED, became unresponsive and hypotensive, requiring IVF resuscitation. Tested covid pos. CCM consulted with pt later requiring pressor support. Now off pressors and transferred to [facility]  Subjective / 24h Interval events: No complaints other than his chronic abdominal "fullness".  Assessment & Plan: Principal Problem Hyperglycemia, early DKA, insulin-dependent diabetes mellitus-DKA has resolved, patient is to be continued on subcutaneous insulin, glucose trends have been stable.  Hypoglycemic episodes improved  CBG (last 3)  Recent Labs    02/01/21 2017 02/01/21 2128 02/02/21 0633  GLUCAP 210* 201* 90   Active Problems COVID-19 viral infection-asymptomatic, status post Remdesivir.  Stable.  Given no hypoxia did not get steroids, and few symptoms will only need 10 days of isolation.  Initially tested positive on 1/25 and will come off precautions tomorrow 2/5 Syncope-secondary to hypotension, resolved Lactic acidosis-resolved Elevated troponin, underlying CAD-high-sensitivity troponin 59, seen by cardiology, no further work-up recommended at this time. Elevated LFTs-due to COVID-19, trending down ESRD on HD TTS-nephrology following Chronic systolic CHF-volume management per dialysis COPD-no wheezing, respiratory status stable  Needs placement  Scheduled Meds: . vitamin C  500 mg Oral Daily  . aspirin  81 mg Oral Daily  . atorvastatin  20 mg Oral Daily  . calcium acetate  1,334 mg Oral TID WC  . Chlorhexidine Gluconate Cloth  6 each Topical Q0600  . cholecalciferol  1,000 Units Oral Daily  . gabapentin  100 mg Oral TID  . heparin injection (subcutaneous)  5,000 Units Subcutaneous Q8H   . insulin aspart  0-5 Units Subcutaneous QHS  . insulin aspart  0-9 Units Subcutaneous TID WC  . insulin glargine  6 Units Subcutaneous Daily  . melatonin  3 mg Oral Once  . pantoprazole  20 mg Oral Daily  . sodium chloride flush  10-40 mL Intracatheter Q12H  . zinc sulfate  220 mg Oral Daily   Continuous Infusions: PRN Meds:.dextrose, guaiFENesin-codeine, heparin, hydrALAZINE, Ipratropium-Albuterol, oxyCODONE, sodium chloride flush  Diet Orders (From admission, onward)    Start     Ordered   01/24/21 0842  Diet Carb Modified Fluid consistency: Thin; Room service appropriate? Yes  Diet effective now       Question Answer Comment  Diet-HS Snack? Nothing   Calorie Level Medium 1600-2000   Fluid consistency: Thin   Room service appropriate? Yes      01/24/21 0841          DVT prophylaxis: heparin injection 5,000 Units Start: 01/25/21 1400     Code Status: Full Code  Family Communication: No family at bedside  Status is: Inpatient  Remains inpatient appropriate because:Unsafe d/c plan   Dispo: The patient is from: Home              Anticipated d/c is to: SNF              Anticipated d/c date is: 1 day              Patient currently is medically stable to d/c.   Difficult to place patient Yes   Level of care: Med-Surg  Consultants:  none  Procedures:  None   Microbiology  None   Antimicrobials: None  Objective: Vitals:   02/02/21 0200 02/02/21 0300 02/02/21 0330 02/02/21 0804  BP: (!) 111/50 125/63 (!) 110/59 116/61  Pulse: 85 93 92 92  Resp:  '17 18 17  '$ Temp:  98.5 F (36.9 C) 98.5 F (36.9 C) 98.5 F (36.9 C)  TempSrc:  Oral Oral Oral  SpO2:  95% 92% 100%  Weight:  77.8 kg    Height:        Intake/Output Summary (Last 24 hours) at 02/02/2021 1108 Last data filed at 02/02/2021 [id number] Gross per 24 hour  Intake 680 ml  Output 1513 ml  Net -833 ml   Filed Weights   01/31/21 0108 02/01/21 2355 02/02/21 0300  Weight: 75.4 kg 79.3 kg 77.8 kg     Examination:  Constitutional: NAD Cardiovascular: RRR Lungs: CTA, no wheezing  Data Reviewed: I have independently reviewed following labs and imaging studies   CBC: Recent Labs  Lab 01/27/21 0256 01/30/21 2258 02/02/21 0050  WBC 6.7 3.6* 3.7*  NEUTROABS 5.1  --   --   HGB 11.7* 12.1* 11.0*  HCT 36.4* 39.0 35.8*  MCV 91.5 93.5 94.7  PLT 154 169 0000000   Basic Metabolic Panel: Recent Labs  Lab 01/27/21 0256 01/30/21 2258 02/02/21 0050  NA 135 130* 133*  K 3.9 6.0* 5.6*  CL 101 95* 97*  CO2 22 20* 20*  GLUCOSE 85 331* 163*  BUN 37* 73* 69*  CREATININE 8.35* 11.39* 10.22*  CALCIUM 6.8* 5.3* 5.7*  PHOS  --  4.8* 4.5   Liver Function Tests: Recent Labs  Lab 01/27/21 0256 01/30/21 2258 02/02/21 0050 02/02/21 0053  AST 27  --  26 25  ALT 32  --  29 30  ALKPHOS 89  --  98 97  BILITOT 0.6  --  0.8 0.9  PROT 5.8*  --  5.9* 6.1*  ALBUMIN 2.4* 2.5* 2.4* 2.4*   Coagulation Profile: No results for input(s): INR, PROTIME in the last 168 hours. HbA1C: No results for input(s): HGBA1C in the last 72 hours. CBG: Recent Labs  Lab 02/01/21 1028 02/01/21 1629 02/01/21 2017 02/01/21 2128 02/02/21 0633  GLUCAP 174* 217* 210* 201* 90    Recent Results (from the past 240 hour(s))  SARS Coronavirus 2 by RT PCR (hospital order, performed in [facility] hospital lab) Nasopharyngeal Nasopharyngeal Swab     Status: Abnormal   Collection Time: 01/23/21  5:35 PM   Specimen: Nasopharyngeal Swab  Result Value Ref Range Status   SARS Coronavirus 2 POSITIVE (A) NEGATIVE Final    Comment: RESULT CALLED TO, READ BACK BY AND VERIFIED WITH: RN B ATKINS 2247 [id number] FCP (NOTE) SARS-CoV-2 target nucleic acids are DETECTED  SARS-CoV-2 RNA is generally detectable in upper respiratory specimens  during the acute phase of infection.  Positive results are indicative  of the presence of the identified virus, but do not rule out bacterial infection or co-infection with other pathogens  not detected by the test.  Clinical correlation with patient history and  other diagnostic information is necessary to determine patient infection status.  The expected result is negative.  Fact Sheet for Patients:   StrictlyIdeas.no   Fact Sheet for Healthcare Providers:   BankingDealers.co.za    This test is not yet approved or cleared by the [place] FDA and  has been authorized for detection and/or diagnosis of SARS-CoV-2 by FDA under an Emergency Use Authorization (EUA).  This EUA will remain in effect (meaning this test can  be used) for the  duration of  the COVID-19 declaration under Section 564(b)(1) of the Act, 21 U.S.C. section 360-bbb-3(b)(1), unless the authorization is terminated or revoked sooner.  Performed at [facility], [place] [address] [place], [place] [address]   Culture, blood (Routine X 2) w Reflex to ID Panel     Status: None   Collection Time: 01/24/21 11:27 PM   Specimen: BLOOD RIGHT HAND  Result Value Ref Range Status   Specimen Description BLOOD RIGHT HAND  Final   Special Requests   Final    AEROBIC BOTTLE ONLY Blood Culture results may not be optimal due to an inadequate volume of blood received in culture bottles   Culture   Final    NO GROWTH 5 DAYS Performed at [facility], [place] [address], [place], [place] [address]    Report Status 01/30/2021 FINAL  Final  Culture, blood (Routine X 2) w Reflex to ID Panel     Status: None   Collection Time: 01/24/21 11:35 PM   Specimen: BLOOD RIGHT FOREARM  Result Value Ref Range Status   Specimen Description BLOOD RIGHT FOREARM  Final   Special Requests   Final    AEROBIC BOTTLE ONLY Blood Culture results may not be optimal due to an inadequate volume of blood received in culture bottles   Culture   Final    NO GROWTH 5 DAYS Performed at [facility], [place] [address], [place], [place] [address]    Report Status 01/30/2021 FINAL  Final   MRSA PCR Screening     Status: None   Collection Time: 01/25/21 10:33 AM   Specimen: Nasal Mucosa; Nasopharyngeal  Result Value Ref Range Status   MRSA by PCR NEGATIVE NEGATIVE Final    Comment:        The GeneXpert MRSA Assay (FDA approved for NASAL specimens only), is one component of a comprehensive MRSA colonization surveillance program. It is not intended to diagnose MRSA infection nor to guide or monitor treatment for MRSA infections. Performed at [facility], [place] [address] [place], [place] [address]      Radiology Studies: No results found.   Marzetta Board, MD, PhD [facility]  Between 7 am - 7 pm I am available, please contact me via Amion or Securechat  Between 7 pm - 7 am I am not available, please contact night coverage MD/APP via Amion

## 2021-02-02 NOTE — Progress Notes (Signed)
Inpatient Diabetes Program Recommendations  AACE/ADA: New Consensus Statement on Inpatient Glycemic Control (2015)  Target Ranges:  Prepandial:   less than 140 mg/dL      Peak postprandial:   less than 180 mg/dL (1-2 hours)      Critically ill patients:  140 - 180 mg/dL   Lab Results  Component Value Date   GLUCAP 251 (H) 02/02/2021   HGBA1C 10.4 (H) 01/24/2021    Review of Glycemic Control Results for Jose Robinson, Jose Robinson (MRN [id number]) as of 02/02/2021 12:21  Ref. Range 02/02/2021 06:33 02/02/2021 11:52  Glucose-Capillary Latest Ref Range: 70 - 99 mg/dL 90 251 (H)    Inpatient Diabetes Program Recommendations:    If post prandials remain elevated, please consider:  Novolog 3 units TID with meals if eats at least 50%  Will continue to follow while inpatient.  Thank you, Reche Dixon, RN, BSN Diabetes Coordinator Inpatient Diabetes Program [phone] (team pager from 8a-5p)

## 2021-02-02 NOTE — TOC Progression Note (Addendum)
Transition of Care [facility]) - Progression Note    Patient Details  Name: Jose Robinson MRN: [id number] Date of Birth: July 07, 1956  Transition of Care [facility]) CM/SW Contact  Sharin Mons, RN Phone Number: 02/02/2021, 12:16 PM  Clinical Narrative:    Pt remains without SNF/LTC or ALF bed offers. NCM spoke with [facility] [phone]) regarding ALF need. [facility] stated they do have beds, requested I faxed clinicals for reviewing. NCM faxed clinicals to [phone]. NCM spoke with [facility] admissions/LOGAN inquiring about SNF/LTC beds. [facility] stated they do have beds. Requested NCM resend SNF/LTC referral. [facility] stated if accepted bed will not be available until Monday. NCM resent SNF referral in [place].  NCM call [facility] to f/u with ALF referral. Call unsuccessful. Voice message left.  NCM called [facility]. Voice message left with Maudie [phone]) requesting they review pt again for SNF/ LTC since pt's isolation period coming to an end.  TOC team will continue to monitor and assist with needs....  Expected Discharge Plan: Assisted Living (vs SNF) Barriers to Discharge: No SNF bed  Expected Discharge Plan and Services Expected Discharge Plan: Assisted Living (vs SNF) In-house Referral: NA Discharge Planning Services: CM Consult Post Acute Care Choice: Home Health,Durable Medical Equipment Living arrangements for the past 2 months: Single Family Home                 DME Arranged: Glucometer DME Agency: NA       HH Arranged: RN,PT,OT,Nurse's Aide HH Agency: [facility] Date Laura: 01/29/21 Time Kenneth: [place] Representative spoke with at [place]: [place] ([place]) Interventions    Readmission Risk Interventions No flowsheet data found.

## 2021-02-03 LAB — RENAL FUNCTION PANEL
Albumin: 2.4 g/dL — ABNORMAL LOW (ref 3.5–5.0)
Anion gap: 13 (ref 5–15)
BUN: 53 mg/dL — ABNORMAL HIGH (ref 8–23)
CO2: 25 mmol/L (ref 22–32)
Calcium: 6.5 mg/dL — ABNORMAL LOW (ref 8.9–10.3)
Chloride: 96 mmol/L — ABNORMAL LOW (ref 98–111)
Creatinine, Ser: 9.35 mg/dL — ABNORMAL HIGH (ref 0.61–1.24)
GFR, Estimated: 6 mL/min — ABNORMAL LOW (ref >60)
Glucose, Bld: 287 mg/dL — ABNORMAL HIGH (ref 70–99)
Phosphorus: 4.3 mg/dL (ref 2.5–4.6)
Potassium: 5.1 mmol/L (ref 3.5–5.1)
Sodium: 134 mmol/L — ABNORMAL LOW (ref 135–145)

## 2021-02-03 LAB — CBC
HCT: 36.3 % — ABNORMAL LOW (ref 39.0–52.0)
Hemoglobin: 11.2 g/dL — ABNORMAL LOW (ref 13.0–17.0)
MCH: 29.6 pg (ref 26.0–34.0)
MCHC: 30.9 g/dL (ref 30.0–36.0)
MCV: 95.8 fL (ref 80.0–100.0)
Platelets: 190 10*3/uL (ref 150–400)
RBC: 3.79 MIL/uL — ABNORMAL LOW (ref 4.22–5.81)
RDW: 14.6 % (ref 11.5–15.5)
WBC: 3 10*3/uL — ABNORMAL LOW (ref 4.0–10.5)
nRBC: 0 % (ref 0.0–0.2)

## 2021-02-03 LAB — GLUCOSE, CAPILLARY
Glucose-Capillary: 166 mg/dL — ABNORMAL HIGH (ref 70–99)
Glucose-Capillary: 267 mg/dL — ABNORMAL HIGH (ref 70–99)
Glucose-Capillary: 96 mg/dL (ref 70–99)
Glucose-Capillary: 406 mg/dL — ABNORMAL HIGH (ref 70–99)

## 2021-02-03 LAB — CALCIUM, IONIZED: Calcium, Ionized, Serum: 4 mg/dL — ABNORMAL LOW (ref 4.5–5.6)

## 2021-02-03 MED ORDER — HEPARIN SODIUM (PORCINE) 1000 UNIT/ML IJ SOLN
INTRAMUSCULAR | Status: AC
  Administered 2021-02-03: 4800 [IU]
  Filled 2021-02-03: qty 2

## 2021-02-03 MED ORDER — HEPARIN SODIUM (PORCINE) 1000 UNIT/ML IJ SOLN
INTRAMUSCULAR | Status: AC
  Filled 2021-02-03: qty 5

## 2021-02-03 MED ORDER — DOXERCALCIFEROL 4 MCG/2ML IV SOLN
INTRAVENOUS | Status: AC
  Filled 2021-02-03: qty 2

## 2021-02-03 NOTE — Progress Notes (Signed)
PROGRESS NOTE  Jose Robinson [id number] DOB: 01/03/1956 DOA: 01/23/2021 PCP: Pcp, No   LOS: 10 days   Brief Narrative / Interim history: 65yo with hx ESRD on TTS HD, CAD, PAD, chronic systolic chf, copd, DM who presented with generalized weakness, found to have glucose just under 500. While in ED, became unresponsive and hypotensive, requiring IVF resuscitation. Tested covid pos. CCM consulted with pt later requiring pressor support. Now off pressors and transferred to [facility]  Subjective / 24h Interval events: Happy that he is off isolation, wants to be taken out to fresh air just for a minute  Assessment & Plan: Principal Problem Hyperglycemia, early DKA, insulin-dependent diabetes mellitus-DKA has resolved, patient is to be continued on subcutaneous insulin, glucose trends have been stable.  Hypoglycemic episodes improved  CBG (last 3)  Recent Labs    02/02/21 1706 02/02/21 2048 02/03/21 0638  GLUCAP 111* 174* 166*   Active Problems COVID-19 viral infection-asymptomatic, status post Remdesivir.  Stable.  Given no hypoxia did not get steroids, and few symptoms will only need 10 days of isolation.  Initially tested positive on 1/25, off precaution 2/5 Syncope-secondary to hypotension, resolved Lactic acidosis-resolved Elevated troponin, underlying CAD-high-sensitivity troponin 59, seen by cardiology, no further work-up recommended at this time. Elevated LFTs-due to COVID-19, trending down ESRD on HD TTS-nephrology following Chronic systolic CHF-volume management per dialysis COPD-no wheezing, respiratory status stable  Needs placement  Scheduled Meds: . vitamin C  500 mg Oral Daily  . aspirin  81 mg Oral Daily  . atorvastatin  20 mg Oral Daily  . calcium acetate  1,334 mg Oral TID WC  . calcium carbonate  1,250 mg Oral QPM  . Chlorhexidine Gluconate Cloth  6 each Topical Q0600  . cholecalciferol  1,000 Units Oral Daily  . doxercalciferol  2 mcg Intravenous Q T,Th,Sa-HD   . gabapentin  100 mg Oral TID  . heparin injection (subcutaneous)  5,000 Units Subcutaneous Q8H  . insulin aspart  0-5 Units Subcutaneous QHS  . insulin aspart  0-9 Units Subcutaneous TID WC  . insulin glargine  6 Units Subcutaneous Daily  . melatonin  3 mg Oral Once  . pantoprazole  20 mg Oral Daily  . sodium chloride flush  10-40 mL Intracatheter Q12H  . zinc sulfate  220 mg Oral Daily   Continuous Infusions: PRN Meds:.dextrose, guaiFENesin-codeine, heparin, hydrALAZINE, Ipratropium-Albuterol, oxyCODONE, sodium chloride flush  Diet Orders (From admission, onward)    Start     Ordered   01/24/21 0842  Diet Carb Modified Fluid consistency: Thin; Room service appropriate? Yes  Diet effective now       Question Answer Comment  Diet-HS Snack? Nothing   Calorie Level Medium 1600-2000   Fluid consistency: Thin   Room service appropriate? Yes      01/24/21 0841          DVT prophylaxis: heparin injection 5,000 Units Start: 01/25/21 1400     Code Status: Full Code  Family Communication: No family at bedside  Status is: Inpatient  Remains inpatient appropriate because:Unsafe d/c plan   Dispo: The patient is from: Home              Anticipated d/c is to: SNF              Anticipated d/c date is: 1 day              Patient currently is medically stable to d/c.   Difficult to place patient Yes  Level of care: Med-Surg  Consultants:  none  Procedures:  None   Microbiology  None   Antimicrobials: None     Objective: Vitals:   02/02/21 1956 02/02/21 2051 02/03/21 0428 02/03/21 0900  BP: 122/71  123/69 111/66  Pulse: 79  79 79  Resp: '18  16 17  '$ Temp: 98.4 F (36.9 C)  (!) 97.5 F (36.4 C) 98.5 F (36.9 C)  TempSrc: Oral  Oral Oral  SpO2:  100%    Weight:      Height:        Intake/Output Summary (Last 24 hours) at 02/03/2021 1122 Last data filed at 02/02/2021 1300 Gross per 24 hour  Intake 240 ml  Output -  Net 240 ml   Filed Weights   01/31/21  0108 02/01/21 2355 02/02/21 0300  Weight: 75.4 kg 79.3 kg 77.8 kg    Examination:  Constitutional: NAD Cardiovascular: RRR Lungs: CTA, no wheezing  Data Reviewed: I have independently reviewed following labs and imaging studies   CBC: Recent Labs  Lab 01/30/21 2258 02/02/21 0050  WBC 3.6* 3.7*  HGB 12.1* 11.0*  HCT 39.0 35.8*  MCV 93.5 94.7  PLT 169 0000000   Basic Metabolic Panel: Recent Labs  Lab 01/30/21 2258 02/02/21 0050  NA 130* 133*  K 6.0* 5.6*  CL 95* 97*  CO2 20* 20*  GLUCOSE 331* 163*  BUN 73* 69*  CREATININE 11.39* 10.22*  CALCIUM 5.3* 5.7*  PHOS 4.8* 4.5   Liver Function Tests: Recent Labs  Lab 01/30/21 2258 02/02/21 0050 02/02/21 0053  AST  --  26 25  ALT  --  29 30  ALKPHOS  --  98 97  BILITOT  --  0.8 0.9  PROT  --  5.9* 6.1*  ALBUMIN 2.5* 2.4* 2.4*   Coagulation Profile: No results for input(s): INR, PROTIME in the last 168 hours. HbA1C: No results for input(s): HGBA1C in the last 72 hours. CBG: Recent Labs  Lab 02/02/21 0633 02/02/21 1152 02/02/21 1706 02/02/21 2048 02/03/21 0638  GLUCAP 90 251* 111* 174* 166*    Recent Results (from the past 240 hour(s))  Culture, blood (Routine X 2) w Reflex to ID Panel     Status: None   Collection Time: 01/24/21 11:27 PM   Specimen: BLOOD RIGHT HAND  Result Value Ref Range Status   Specimen Description BLOOD RIGHT HAND  Final   Special Requests   Final    AEROBIC BOTTLE ONLY Blood Culture results may not be optimal due to an inadequate volume of blood received in culture bottles   Culture   Final    NO GROWTH 5 DAYS Performed at [facility], [place] [address] [place], [place] [address]    Report Status 01/30/2021 FINAL  Final  Culture, blood (Routine X 2) w Reflex to ID Panel     Status: None   Collection Time: 01/24/21 11:35 PM   Specimen: BLOOD RIGHT FOREARM  Result Value Ref Range Status   Specimen Description BLOOD RIGHT FOREARM  Final   Special Requests   Final    AEROBIC  BOTTLE ONLY Blood Culture results may not be optimal due to an inadequate volume of blood received in culture bottles   Culture   Final    NO GROWTH 5 DAYS Performed at [facility], [place] [address] [place], [place] [address]    Report Status 01/30/2021 FINAL  Final  MRSA PCR Screening     Status: None  Collection Time: 01/25/21 10:33 AM   Specimen: Nasal Mucosa; Nasopharyngeal  Result Value Ref Range Status   MRSA by PCR NEGATIVE NEGATIVE Final    Comment:        The GeneXpert MRSA Assay (FDA approved for NASAL specimens only), is one component of a comprehensive MRSA colonization surveillance program. It is not intended to diagnose MRSA infection nor to guide or monitor treatment for MRSA infections. Performed at [facility], [place] [address], [place],  [address]      Radiology Studies: No results found.   Marzetta Board, MD, PhD [facility]  Between 7 am - 7 pm I am available, please contact me via Amion or Securechat  Between 7 pm - 7 am I am not available, please contact night coverage MD/APP via Amion

## 2021-02-03 NOTE — Progress Notes (Addendum)
[facility] Progress Note   Subjective:   Patient seen and examined at bedside.  Off of isolation as of today, wants to go outside and get fresh air.  States he is feeling ok.  Denies CP, SOB, abdominal pain, n/v/d, weakness and fatigue. Requesting increase UF goal at HD today to 3L, states he does not feel like he is getting all his fluid off.   Objective Vitals:   02/02/21 1956 02/02/21 2051 02/03/21 0428 02/03/21 0900  BP: 122/71  123/69 111/66  Pulse: 79  79 79  Resp: '18  16 17  '$ Temp: 98.4 F (36.9 C)  (!) 97.5 F (36.4 C) 98.5 F (36.9 C)  TempSrc: Oral  Oral Oral  SpO2:  100%    Weight:      Height:       Physical Exam General:chronically ill appearing male in NAD Heart:RRR, no mrg Lungs:mostly CTAB, breath sounds decreased, nml WOB on RA Abdomen:soft, NTND Extremities:1+ edema on L, no edema on R BKA Dialysis Access: [facility]   Filed Weights   01/31/21 0108 02/01/21 2355 02/02/21 0300  Weight: 75.4 kg 79.3 kg 77.8 kg   No intake or output data in the 24 hours ending 02/03/21 1310  Additional Objective Labs: Basic Metabolic Panel: Recent Labs  Lab 01/30/21 2258 02/02/21 0050  NA 130* 133*  K 6.0* 5.6*  CL 95* 97*  CO2 20* 20*  GLUCOSE 331* 163*  BUN 73* 69*  CREATININE 11.39* 10.22*  CALCIUM 5.3* 5.7*  PHOS 4.8* 4.5   Liver Function Tests: Recent Labs  Lab 01/30/21 2258 02/02/21 0050 02/02/21 0053  AST  --  26 25  ALT  --  29 30  ALKPHOS  --  98 97  BILITOT  --  0.8 0.9  PROT  --  5.9* 6.1*  ALBUMIN 2.5* 2.4* 2.4*   Recent Labs  Lab 02/02/21 0050  LIPASE 29   CBC: Recent Labs  Lab 01/30/21 2258 02/02/21 0050  WBC 3.6* 3.7*  HGB 12.1* 11.0*  HCT 39.0 35.8*  MCV 93.5 94.7  PLT 169 181   Blood Culture    Component Value Date/Time   SDES BLOOD RIGHT FOREARM 01/24/2021 2335   SPECREQUEST  01/24/2021 2335    AEROBIC BOTTLE ONLY Blood Culture results may not be optimal due to an inadequate volume of blood received in  culture bottles   CULT  01/24/2021 2335    NO GROWTH 5 DAYS Performed at [facility], [address], [place], [place] [address]    REPTSTATUS 01/30/2021 FINAL 01/24/2021 2335    CBG: Recent Labs  Lab 02/02/21 0633 02/02/21 1152 02/02/21 1706 02/02/21 2048 02/03/21 0638  GLUCAP 90 251* 111* 174* 166*    Medications:  . vitamin C  500 mg Oral Daily  . aspirin  81 mg Oral Daily  . atorvastatin  20 mg Oral Daily  . calcium acetate  1,334 mg Oral TID WC  . calcium carbonate  1,250 mg Oral QPM  . Chlorhexidine Gluconate Cloth  6 each Topical Q0600  . cholecalciferol  1,000 Units Oral Daily  . doxercalciferol  2 mcg Intravenous Q T,Th,Sa-HD  . gabapentin  100 mg Oral TID  . heparin injection (subcutaneous)  5,000 Units Subcutaneous Q8H  . insulin aspart  0-5 Units Subcutaneous QHS  . insulin aspart  0-9 Units Subcutaneous TID WC  . insulin glargine  6 Units Subcutaneous Daily  . melatonin  3 mg Oral Once  . pantoprazole  20 mg Oral Daily  . sodium chloride flush  10-40 mL Intracatheter Q12H  . zinc sulfate  220 mg Oral Daily    Dialysis Orders: TTS NW 4h 400/ 800 68.5kg 2/ 3.5Ca TDC Hep 4000 - hect 1ug - last HD 1/25  CXR 1/25 - IS edema bilat + patchy bilat infiltrates  Assessment/Plan: 1. DKA/DM 2- resolved, per pmd 2. COVID 19 pna - CXR w/ infiltrates, currently on RA.  Off isolation. S/p remdesivir. 3. Syncope -related to #1 and #2 4. ^trop/ hx CAD- no further w/u per cards 5. ESRD - TTS HD.HD today per regular schedule. Treatments here are shortened due to high census/nursing shortage. 6. PAD -sp R BKA, bilat finger amps. Per pmd 7. BP/ volume -BP in goal.  LE edema on exam and over dry weights, will plan for UF goal 3L today as tolerated.  8. MBD ckd -Corrected calcium low. Using 3.5Ca bath with HD, calcium carbonate started.  Continue Ca based binders and increased hectorol at 9mg qHD.  Check pth.  9. Anemia ckd - Hb 11.0,no  ESA indicated at this time. 10. Dispo -ok for dc from renal standpoint, however caregiver no longer able to handle patient and will require placement. SW/CM and renal navigator involved.    LJen Mow PA-C [facility] 02/03/2021,1:10 PM  LOS: 10 days   I have seen and examined this patient and agree with plan and assessment in the above note with renal recommendations/intervention highlighted.  JGovernor RooksColadonato,MD 02/03/2021 1:37 PM

## 2021-02-03 NOTE — Progress Notes (Signed)
   02/03/21 1615  Vitals  Temp 98.1 F (36.7 C)  Temp Source Oral  BP 112/77  BP Location Left Arm  BP Method Automatic  Patient Position (if appropriate) Lying  Pulse Rate 75  ECG Heart Rate 73  Oxygen Therapy  SpO2 96 %  O2 Device Room Air  Post-Hemodialysis Assessment  Rinseback Volume (mL) 250 mL  KECN 277 V  Dialyzer Clearance Lightly streaked  Duration of HD Treatment -hour(s) 2.5 hour(s)  Hemodialysis Intake (mL) 500 mL  UF Total -Machine (mL) 2000 mL  Net UF (mL) 1500 mL  Tolerated HD Treatment Yes  Post-Hemodialysis Comments tx complete-pt stable  Hemodialysis Catheter Left Subclavian Double lumen Permanent (Tunneled)  Placement Date/Time: (c) 01/25/21 (c) 1000   Placed prior to admission: Yes  Orientation: Left  Access Location: Subclavian  Hemodialysis Catheter Type: Double lumen Permanent (Tunneled)  Site Condition No complications  Blue Lumen Status Flushed;Heparin locked;Capped (Central line)  Red Lumen Status Capped (Central line);Heparin locked;Flushed  Catheter fill solution Heparin 1000 units/ml  Catheter fill volume (Arterial) 2.4 cc  Catheter fill volume (Venous) 2.4  Dressing Type Occlusive  Dressing Status Clean;Dry;Intact  Antimicrobial disc in place? Yes  Drainage Description None  Dressing Change Due 02/09/21  Post treatment catheter status Capped and Clamped

## 2021-02-03 NOTE — Progress Notes (Addendum)
CRITICAL VALUE ALERT  Critical Value:  CBG 406  Date & Time Notied:  02/03/2021 '@2122'$  Paged 2nd time '@2155'$   Provider Notified: Charissa Bash oncall  Orders Received/Actions taken: order to give 5 units novolog, no need for recheck per NP

## 2021-02-04 LAB — GLUCOSE, CAPILLARY
Glucose-Capillary: 140 mg/dL — ABNORMAL HIGH (ref 70–99)
Glucose-Capillary: 168 mg/dL — ABNORMAL HIGH (ref 70–99)
Glucose-Capillary: 276 mg/dL — ABNORMAL HIGH (ref 70–99)
Glucose-Capillary: 126 mg/dL — ABNORMAL HIGH (ref 70–99)

## 2021-02-04 MED ORDER — INSULIN ASPART 100 UNIT/ML ~~LOC~~ SOLN
3.0000 [IU] | Freq: Three times a day (TID) | SUBCUTANEOUS | Status: DC
  Administered 2021-02-04 – 2021-02-09 (×13): 3 [IU] via SUBCUTANEOUS

## 2021-02-04 MED ORDER — MIDODRINE HCL 5 MG PO TABS: 10.0000 mg | ORAL_TABLET | ORAL | Status: DC

## 2021-02-04 NOTE — Progress Notes (Signed)
PROGRESS NOTE  Jose Robinson [id number] DOB: 01-21-1956 DOA: 01/23/2021 PCP: Pcp, No   LOS: 11 days   Brief Narrative / Interim history: 64yo with hx ESRD on TTS HD, CAD, PAD, chronic systolic chf, copd, DM who presented with generalized weakness, found to have glucose just under 500. While in ED, became unresponsive and hypotensive, requiring IVF resuscitation. Tested covid pos. CCM consulted with pt later requiring pressor support. Now off pressors and transferred to [facility]  Subjective / 24h Interval events: Wants to be taken out for fracture.  Worried about his future  Assessment & Plan: Principal Problem Hyperglycemia, early DKA, insulin-dependent diabetes mellitus-DKA has resolved, patient is to be continued on subcutaneous insulin, glucose trends have been stable.  Hypoglycemic episodes improved, now hyperglycemic especially at the end of the day last night.  Add mealtime with NovoLog 3 units  CBG (last 3)  Recent Labs    02/03/21 2110 02/04/21 0749 02/04/21 1042  GLUCAP 406* 140* 168*   Active Problems COVID-19 viral infection-asymptomatic, status post Remdesivir.  Stable.  Given no hypoxia did not get steroids, and few symptoms will only need 10 days of isolation.  Initially tested positive on 1/25, off precaution 2/5 Syncope-secondary to hypotension, resolved Lactic acidosis-resolved Elevated troponin, underlying CAD-high-sensitivity troponin 59, seen by cardiology, no further work-up recommended at this time. Elevated LFTs-due to COVID-19, trending down ESRD on HD TTS-nephrology following Chronic systolic CHF-volume management per dialysis COPD-no wheezing, respiratory status stable  Needs placement  Scheduled Meds: . vitamin C  500 mg Oral Daily  . aspirin  81 mg Oral Daily  . atorvastatin  20 mg Oral Daily  . calcium acetate  1,334 mg Oral TID WC  . calcium carbonate  1,250 mg Oral QPM  . Chlorhexidine Gluconate Cloth  6 each Topical Q0600  . cholecalciferol   1,000 Units Oral Daily  . doxercalciferol  2 mcg Intravenous Q T,Th,Sa-HD  . gabapentin  100 mg Oral TID  . heparin injection (subcutaneous)  5,000 Units Subcutaneous Q8H  . insulin aspart  0-5 Units Subcutaneous QHS  . insulin aspart  0-9 Units Subcutaneous TID WC  . insulin glargine  6 Units Subcutaneous Daily  . melatonin  3 mg Oral Once  . pantoprazole  20 mg Oral Daily  . sodium chloride flush  10-40 mL Intracatheter Q12H  . zinc sulfate  220 mg Oral Daily   Continuous Infusions: PRN Meds:.dextrose, guaiFENesin-codeine, hydrALAZINE, Ipratropium-Albuterol, oxyCODONE, sodium chloride flush  Diet Orders (From admission, onward)    Start     Ordered   01/24/21 0842  Diet Carb Modified Fluid consistency: Thin; Room service appropriate? Yes  Diet effective now       Question Answer Comment  Diet-HS Snack? Nothing   Calorie Level Medium 1600-2000   Fluid consistency: Thin   Room service appropriate? Yes      01/24/21 0841          DVT prophylaxis: heparin injection 5,000 Units Start: 01/25/21 1400     Code Status: Full Code  Family Communication: No family at bedside  Status is: Inpatient  Remains inpatient appropriate because:Unsafe d/c plan   Dispo: The patient is from: Home              Anticipated d/c is to: SNF              Anticipated d/c date is: 1 day              Patient currently is medically stable  to d/c.   Difficult to place patient Yes   Level of care: Med-Surg  Consultants:  none  Procedures:  None   Microbiology  None   Antimicrobials: None     Objective: Vitals:   02/03/21 1615 02/03/21 1654 02/03/21 2121 02/04/21 0445  BP: 112/77 112/69 (!) 101/59 (!) 84/61  Pulse: 75 72 97 76  Resp:  16  17  Temp: 98.1 F (36.7 C) 98 F (36.7 C) 98 F (36.7 C) 98.6 F (37 C)  TempSrc: Oral Oral Oral Oral  SpO2: 96% 100% 98% 98%  Weight:      Height:        Intake/Output Summary (Last 24 hours) at 02/04/2021 1116 Last data filed at  02/03/2021 1615 Gross per 24 hour  Intake --  Output 1500 ml  Net -1500 ml   Filed Weights   02/01/21 2355 02/02/21 0300 02/03/21 1330  Weight: 79.3 kg 77.8 kg 78.5 kg    Examination:  Constitutional: NAD, calm, comfortable Eyes: lids and conjunctivae normal ENMT: Mucous membranes are moist.  Neck: normal, supple Respiratory: clear to auscultation bilaterally, no wheezing, no crackles. Normal respiratory effort.  Cardiovascular: Regular rate and rhythm, no murmurs / rubs / gallops. No LE edema.  Abdomen: no tenderness. Bowel sounds positive.  Skin: no rashes, lesions, ulcers. No induration Neurologic: non focal  Data Reviewed: I have independently reviewed following labs and imaging studies   CBC: Recent Labs  Lab 01/30/21 2258 02/02/21 0050 02/03/21 1347  WBC 3.6* 3.7* 3.0*  HGB 12.1* 11.0* 11.2*  HCT 39.0 35.8* 36.3*  MCV 93.5 94.7 95.8  PLT 169 181 [id number]   Basic Metabolic Panel: Recent Labs  Lab 01/30/21 2258 02/02/21 0050 02/03/21 1347  NA 130* 133* 134*  K 6.0* 5.6* 5.1  CL 95* 97* 96*  CO2 20* 20* 25  GLUCOSE 331* 163* 287*  BUN 73* 69* 53*  CREATININE 11.39* 10.22* 9.35*  CALCIUM 5.3* 5.7* 6.5*  PHOS 4.8* 4.5 4.3   Liver Function Tests: Recent Labs  Lab 01/30/21 2258 02/02/21 0050 02/02/21 0053 02/03/21 1347  AST  --  26 25  --   ALT  --  29 30  --   ALKPHOS  --  98 97  --   BILITOT  --  0.8 0.9  --   PROT  --  5.9* 6.1*  --   ALBUMIN 2.5* 2.4* 2.4* 2.4*   Coagulation Profile: No results for input(s): INR, PROTIME in the last 168 hours. HbA1C: No results for input(s): HGBA1C in the last 72 hours. CBG: Recent Labs  Lab 02/03/21 1138 02/03/21 1652 02/03/21 2110 02/04/21 0749 02/04/21 1042  GLUCAP 267* 96 406* 140* 168*    No results found for this or any previous visit (from the past 240 hour(s)).   Radiology Studies: No results found.   Marzetta Board, MD, PhD [facility]  Between 7 am - 7 pm I am available, please  contact me via Amion or Securechat  Between 7 pm - 7 am I am not available, please contact night coverage MD/APP via Amion

## 2021-02-04 NOTE — Progress Notes (Addendum)
[facility] Progress Note   Subjective:   Patient seen and examined at bedside.  Reports having a bad morning because he was given the wrong breakfast and hasn't eaten yet.  Aggravated he was not able to pull off more fluid at dialysis yesterday due to hypotension.  Requesting midodrine be added pre HD.  Denies CP, SOB, n/v/d, abdominal pain and edema.   Objective Vitals:   02/03/21 1615 02/03/21 1654 02/03/21 2121 02/04/21 0445  BP: 112/77 112/69 (!) 101/59 (!) 84/61  Pulse: 75 72 97 76  Resp:  16  17  Temp: 98.1 F (36.7 C) 98 F (36.7 C) 98 F (36.7 C) 98.6 F (37 C)  TempSrc: Oral Oral Oral Oral  SpO2: 96% 100% 98% 98%  Weight:      Height:       Physical Exam General:chronically ill appearing male in NAD Heart:RRR, no mrg Lungs: mostly CTAB, BD decreased in bases, nml WOB on RA Abdomen:soft, NTND Extremities:no LE edema, R BKA Dialysis Access: [facility] c/d/i   Filed Weights   02/01/21 2355 02/02/21 0300 02/03/21 1330  Weight: 79.3 kg 77.8 kg 78.5 kg    Intake/Output Summary (Last 24 hours) at 02/04/2021 1129 Last data filed at 02/03/2021 1615 Gross per 24 hour  Intake --  Output 1500 ml  Net -1500 ml    Additional Objective Labs: Basic Metabolic Panel: Recent Labs  Lab 01/30/21 2258 02/02/21 0050 02/03/21 1347  NA 130* 133* 134*  K 6.0* 5.6* 5.1  CL 95* 97* 96*  CO2 20* 20* 25  GLUCOSE 331* 163* 287*  BUN 73* 69* 53*  CREATININE 11.39* 10.22* 9.35*  CALCIUM 5.3* 5.7* 6.5*  PHOS 4.8* 4.5 4.3   Liver Function Tests: Recent Labs  Lab 02/02/21 0050 02/02/21 0053 02/03/21 1347  AST 26 25  --   ALT 29 30  --   ALKPHOS 98 97  --   BILITOT 0.8 0.9  --   PROT 5.9* 6.1*  --   ALBUMIN 2.4* 2.4* 2.4*   Recent Labs  Lab 02/02/21 0050  LIPASE 29   CBC: Recent Labs  Lab 01/30/21 2258 02/02/21 0050 02/03/21 1347  WBC 3.6* 3.7* 3.0*  HGB 12.1* 11.0* 11.2*  HCT 39.0 35.8* 36.3*  MCV 93.5 94.7 95.8  PLT 169 181 190   CBG: Recent  Labs  Lab 02/03/21 1138 02/03/21 1652 02/03/21 2110 02/04/21 0749 02/04/21 1042  GLUCAP 267* 96 406* 140* 168*    Medications:  . vitamin C  500 mg Oral Daily  . aspirin  81 mg Oral Daily  . atorvastatin  20 mg Oral Daily  . calcium acetate  1,334 mg Oral TID WC  . calcium carbonate  1,250 mg Oral QPM  . Chlorhexidine Gluconate Cloth  6 each Topical Q0600  . cholecalciferol  1,000 Units Oral Daily  . doxercalciferol  2 mcg Intravenous Q T,Th,Sa-HD  . gabapentin  100 mg Oral TID  . heparin injection (subcutaneous)  5,000 Units Subcutaneous Q8H  . insulin aspart  0-5 Units Subcutaneous QHS  . insulin aspart  0-9 Units Subcutaneous TID WC  . insulin aspart  3 Units Subcutaneous TID WC  . insulin glargine  6 Units Subcutaneous Daily  . melatonin  3 mg Oral Once  . [START ON 02/06/2021] midodrine  10 mg Oral Q T,Th,Sa-HD  . pantoprazole  20 mg Oral Daily  . sodium chloride flush  10-40 mL Intracatheter Q12H  . zinc sulfate  220 mg Oral Daily  Dialysis Orders: TTS NW 4h 400/ 800 68.5kg 2/ 3.5Ca TDC Hep 4000 - hect 1ug - last HD 1/25  CXR 1/25 - IS edema bilat + patchy bilat infiltrates  Assessment/Plan: 1. DKA/DM 2- resolved, per pmd 2. COVID 19 pna - CXR 1/25 w/ infiltrates, currently on RA.  Off isolation. S/p remdesivir. 3. Syncope -related to #1 and #2 4. ^trop/ hx CAD- no further w/u per cards 5. ESRD - TTS HD.Next HD on 02/06/21. Treatments here are shortened due to high census/nursing shortage. 6. PAD -s/p R BKA, bilat finger amps. Per pmd 7. BP/ volume -BP soft.  Hypotension with dialysis limiting UF - ordered midodrine pre HD. Does not appear grossly volume overloaded. Significantly over dry weight if weights correct, possible weight gain?  8. MBD ckd -Corrected calcium low. Using 3.5Ca bath with HD, calcium carbonate started.  Continue Ca based binders and increased hectorol at 48mg qHD.  Check pth.  9. Anemia ckd - Hb 11.2,no ESA indicated  at this time. 10. Dispo -ok for dc from renal standpoint, however caregiver no longer able to handle patient and will require placement. SW/CM and renal navigator involved.    LJen Mow PA-C [facility] 02/04/2021,11:29 AM  LOS: 11 days   I have seen and examined this patient and agree with plan and assessment in the above note with renal recommendations/intervention highlighted.  JBroadus JohnA Moriah Shawley,MD 02/04/2021 11:56 AM

## 2021-02-04 NOTE — Plan of Care (Signed)

## 2021-02-05 LAB — GLUCOSE, CAPILLARY
Glucose-Capillary: 100 mg/dL — ABNORMAL HIGH (ref 70–99)
Glucose-Capillary: 130 mg/dL — ABNORMAL HIGH (ref 70–99)
Glucose-Capillary: 181 mg/dL — ABNORMAL HIGH (ref 70–99)
Glucose-Capillary: 210 mg/dL — ABNORMAL HIGH (ref 70–99)

## 2021-02-05 MED ORDER — HEPARIN SODIUM (PORCINE) 1000 UNIT/ML IJ SOLN
INTRAMUSCULAR | Status: AC
  Administered 2021-02-05: 4000 [IU]
  Filled 2021-02-05: qty 8

## 2021-02-05 MED ORDER — ALBUMIN HUMAN 25 % IV SOLN
INTRAVENOUS | Status: AC
  Administered 2021-02-05: 25 g
  Filled 2021-02-05: qty 100

## 2021-02-05 MED ORDER — MIDODRINE HCL 5 MG PO TABS
10.0000 mg | ORAL_TABLET | Freq: Three times a day (TID) | ORAL | Status: DC
Start: 2021-02-05 — End: 2021-02-09
  Administered 2021-02-05 – 2021-02-09 (×10): 10 mg via ORAL
  Filled 2021-02-05 (×12): qty 2

## 2021-02-05 NOTE — Progress Notes (Addendum)
Subjective: Seen and examined bedside, no current complaints asked about getting his midodrine on dialysis tomorrow with Covid isolation UF we can get volume off.  He denies shortness of breath  Objective Vital signs in last 24 hours: Vitals:   02/04/21 0445 02/04/21 1433 02/04/21 2000 02/05/21 0300  BP: (!) 84/61 (!) 107/48 (!) 110/55 111/62  Pulse: 76 84 93 85  Resp: '17 18 18 17  '$ Temp: 98.6 F (37 C) 98.5 F (36.9 C) 98.4 F (36.9 C) 98 F (36.7 C)  TempSrc: Oral Oral Oral Oral  SpO2: 98% 98% 99% 100%  Weight:      Height:       Weight change:   Physical Exam General: Alert, chronically ill appearing male in NAD Heart:RRR, no mrg Lungs  CTAB, slightly decreased in bases, nml WOB on RA Abdomen:soft, NTND Extremities:no LE edema, R BKA Dialysis Access: [facility] c/d/i   Dialysis Orders: TTS NW 4h 400/ 800 68.5kg 2/ 3.5Ca TDC Hep 4000 - hect 1ug - last HD 1/25  CXR 1/25 - IS edema bilat + patchy bilat infiltrates  Problem/Plan:  1. DKA/DM 2- resolved, per pmd 2. COVID 19 pna - CXR 1/25 w/ infiltrates,currently on RA.  Continued on isolation. S/p remdesivir. 3. Syncope -related to #1 and #2 4. ^trop/ hx CAD- no further w/u per cards 5. ESRD - TTS HD.Next HD tomorrow on schedule . Treatments here are shortened due to high census/nursing shortage.  Use 10 mg midodrine and also Albumin 6. PAD -s/p R BKA, bilat finger amps. Per pmd 7. BP/ volume -BP soft.  Hypotension with dialysis limiting UF - ordered midodrine pre HD.   Also use albumin does not appear grossly volume overloaded.  Bed weights are 10 kg over dry weight, attempt 3 L l UF tomorrow possible weight gain?  8. MBD ckd -Corrected calciumlow. Using3.5Ca bath with HD, calcium carbonate started. Continue Ca based binders and increased hectorol at 49mg qHD. Check pth.  9. Anemia of ckd - Hb 11.2,no ESA indicated at this time. 10. Dispo -ok for dc from renal standpoint, however caregiver no  longer able to handle patient and will require placement. SW/CM and renal navigator involved.  DErnest Haber PA-C [id number]/06/2021,10:43 AM  LOS: 12 days    Pt seen, examined and agree w A/P as above.  RKelly Splinter MD 02/05/2021, 5:28 PM     Labs: Basic Metabolic Panel: Recent Labs  Lab 01/30/21 2258 02/02/21 0050 02/03/21 1347  NA 130* 133* 134*  K 6.0* 5.6* 5.1  CL 95* 97* 96*  CO2 20* 20* 25  GLUCOSE 331* 163* 287*  BUN 73* 69* 53*  CREATININE 11.39* 10.22* 9.35*  CALCIUM 5.3* 5.7* 6.5*  PHOS 4.8* 4.5 4.3   Liver Function Tests: Recent Labs  Lab 02/02/21 0050 02/02/21 0053 02/03/21 1347  AST 26 25  --   ALT 29 30  --   ALKPHOS 98 97  --   BILITOT 0.8 0.9  --   PROT 5.9* 6.1*  --   ALBUMIN 2.4* 2.4* 2.4*   Recent Labs  Lab 02/02/21 0050  LIPASE 29   No results for input(s): AMMONIA in the last 168 hours. CBC: Recent Labs  Lab 01/30/21 2258 02/02/21 0050 02/03/21 1347  WBC 3.6* 3.7* 3.0*  HGB 12.1* 11.0* 11.2*  HCT 39.0 35.8* 36.3*  MCV 93.5 94.7 95.8  PLT 169 181 190   Cardiac Enzymes: No results for input(s): CKTOTAL, CKMB, CKMBINDEX, TROPONINI in  the last 168 hours. CBG: Recent Labs  Lab 02/04/21 0749 02/04/21 1042 02/04/21 1542 02/04/21 2058 02/05/21 0633  GLUCAP 140* 168* 276* 126* 100*    Studies/Results: No results found. Medications:  . vitamin C  500 mg Oral Daily  . aspirin  81 mg Oral Daily  . atorvastatin  20 mg Oral Daily  . calcium acetate  1,334 mg Oral TID WC  . calcium carbonate  1,250 mg Oral QPM  . Chlorhexidine Gluconate Cloth  6 each Topical Q0600  . cholecalciferol  1,000 Units Oral Daily  . doxercalciferol  2 mcg Intravenous Q T,Th,Sa-HD  . gabapentin  100 mg Oral TID  . heparin injection (subcutaneous)  5,000 Units Subcutaneous Q8H  . insulin aspart  0-5 Units Subcutaneous QHS  . insulin aspart  0-9 Units Subcutaneous TID WC  . insulin aspart  3 Units Subcutaneous  TID WC  . insulin glargine  6 Units Subcutaneous Daily  . melatonin  3 mg Oral Once  . [START ON 02/06/2021] midodrine  10 mg Oral Q T,Th,Sa-HD  . pantoprazole  20 mg Oral Daily  . sodium chloride flush  10-40 mL Intracatheter Q12H  . zinc sulfate  220 mg Oral Daily

## 2021-02-05 NOTE — Progress Notes (Signed)
Patient to Dialysis.

## 2021-02-05 NOTE — Progress Notes (Signed)
Physical Therapy Treatment Patient Details Name: Jose Robinson MRN: [id number] DOB: [birth year] Today's Date: 02/05/2021    History of Present Illness 65yo with hx ESRD on TTS HD, CAD, PAD, chronic systolic chf, copd, DM, R BKA who presented with generalized weakness, found to have glucose just under 500. While in ED, became unresponsive and hypotensive, requiring IVF resuscitation. Tested covid pos.    PT Comments    Pt seated edge of bed on arrival this session.  Pt continues to benefit from skilled rehab in home settting as he is unable to donn prosthesis without total assistance.  Pt reports he never uses a shrinker but would benefit from use of one as prosthesis was difficult to place and required increased time this session.  Once leg donned able to increase gt distance. Required removal of one sock layer to donn prosthesis.    Follow Up Recommendations  Home health PT;Supervision for mobility/OOB     Equipment Recommendations  None recommended by PT    Recommendations for Other Services       Precautions / Restrictions Precautions Precautions: Fall Required Braces or Orthoses: Other Brace Other Brace: R prosthesis Restrictions Weight Bearing Restrictions: No    Mobility  Bed Mobility               General bed mobility comments: Pt seated edge of bed on arrival this session.  Transfers Overall transfer level: Needs assistance Equipment used: Rolling walker (2 wheeled) Transfers: Sit to/from Stand Sit to Stand: Mod assist         General transfer comment: Cues for hand placement to and from seated surface.  Pt required assistance to forward weight shift.  Ambulation/Gait Ambulation/Gait assistance: Min assist;Min guard Gait Distance (Feet): 150 Feet Assistive device: Rolling walker (2 wheeled) Gait Pattern/deviations: Step-through pattern;Decreased stride length;Trunk flexed     General Gait Details: Cues for sequencing posture and RW safety.  Pt able  to increase gt distance this session.   Stairs             Wheelchair Mobility    Modified Rankin (Stroke Patients Only)       Balance Overall balance assessment: Needs assistance Sitting-balance support: Single extremity supported;Feet supported Sitting balance-Leahy Scale: Fair Sitting balance - Comments: EOB without UE support     Standing balance-Leahy Scale: Poor Standing balance comment: RW for standing and gait                            Cognition Arousal/Alertness: Awake/alert Behavior During Therapy: WFL for tasks assessed/performed Overall Cognitive Status: Within Functional Limits for tasks assessed                                        Exercises      General Comments        Pertinent Vitals/Pain Pain Assessment: No/denies pain    Home Living                      Prior Function            PT Goals (current goals can now be found in the care plan section) Acute Rehab PT Goals Patient Stated Goal: return home Potential to Achieve Goals: Good Progress towards PT goals: Progressing toward goals    Frequency    Min 3X/week  PT Plan Current plan remains appropriate    Co-evaluation              AM-PAC PT "6 Clicks" Mobility   Outcome Measure  Help needed turning from your back to your side while in a flat bed without using bedrails?: None Help needed moving from lying on your back to sitting on the side of a flat bed without using bedrails?: None Help needed moving to and from a bed to a chair (including a wheelchair)?: A Little Help needed standing up from a chair using your arms (e.g., wheelchair or bedside chair)?: A Little Help needed to walk in hospital room?: A Little Help needed climbing 3-5 steps with a railing? : A Lot 6 Click Score: 19    End of Session Equipment Utilized During Treatment: Gait belt Activity Tolerance: Patient tolerated treatment well Patient left: in  chair;with call bell/phone within reach Nurse Communication: Mobility status PT Visit Diagnosis: Unsteadiness on feet (R26.81);Muscle weakness (generalized) (M62.81);Difficulty in walking, not elsewhere classified (R26.2)     Time: [id number] PT Time Calculation (min) (ACUTE ONLY): 48 min  Charges:  $Gait Training: 8-22 mins $Therapeutic Activity: 23-37 mins                     Jose Robinson , PTA Acute Rehabilitation Services Pager [phone] Office [phone]     Jose Robinson Jose Robinson 02/05/2021, 6:24 PM

## 2021-02-05 NOTE — Plan of Care (Signed)

## 2021-02-05 NOTE — Plan of Care (Signed)

## 2021-02-05 NOTE — Progress Notes (Signed)
PROGRESS NOTE  Jose Robinson [id number] DOB: [birth year] DOA: 01/23/2021 PCP: Pcp, No   LOS: 12 days   Brief Narrative / Interim history: 65yo with hx ESRD on TTS HD, CAD, PAD, chronic systolic chf, copd, DM who presented with generalized weakness, found to have glucose just under 500. While in ED, became unresponsive and hypotensive, requiring IVF resuscitation. Tested covid pos. CCM consulted with pt later requiring pressor support. Now off pressors and transferred to [facility]  Subjective / 24h Interval events: Upset that he hasnt had a chance to go outside for few minutes yesterday and 2 days ago despite being promised. Upset that nobody helped him with a bath for 3 days. No dyspnea.  Assessment & Plan: Principal Problem Hyperglycemia, early DKA, insulin-dependent diabetes mellitus-DKA has resolved, patient is to be continued on subcutaneous insulin, glucose trends have been stable.  Hypoglycemic episodes improved, now hyperglycemic especially at the end of the day last night.  Added mealtime with NovoLog 3 units, CBGs stable today   CBG (last 3)  Recent Labs    02/04/21 2058 02/05/21 0633 02/05/21 1159  GLUCAP 126* 100* 210*   Active Problems COVID-19 viral infection-asymptomatic, status post Remdesivir.  Stable.  Given no hypoxia did not get steroids, and few symptoms will only need 10 days of isolation.  Initially tested positive on 1/25, off precaution 2/5 Syncope-secondary to hypotension, resolved Lactic acidosis-resolved Elevated troponin, underlying CAD-high-sensitivity troponin 59, seen by cardiology, no further work-up recommended at this time. Elevated LFTs-due to COVID-19, trending down ESRD on HD TTS-nephrology following Chronic systolic CHF-volume management per dialysis COPD-no wheezing, respiratory status stable  Needs placement  Scheduled Meds: . vitamin C  500 mg Oral Daily  . aspirin  81 mg Oral Daily  . atorvastatin  20 mg Oral Daily  . calcium acetate   1,334 mg Oral TID WC  . calcium carbonate  1,250 mg Oral QPM  . Chlorhexidine Gluconate Cloth  6 each Topical Q0600  . cholecalciferol  1,000 Units Oral Daily  . doxercalciferol  2 mcg Intravenous Q T,Th,Sa-HD  . gabapentin  100 mg Oral TID  . heparin injection (subcutaneous)  5,000 Units Subcutaneous Q8H  . insulin aspart  0-5 Units Subcutaneous QHS  . insulin aspart  0-9 Units Subcutaneous TID WC  . insulin aspart  3 Units Subcutaneous TID WC  . insulin glargine  6 Units Subcutaneous Daily  . melatonin  3 mg Oral Once  . [START ON 02/06/2021] midodrine  10 mg Oral Q T,Th,Sa-HD  . pantoprazole  20 mg Oral Daily  . sodium chloride flush  10-40 mL Intracatheter Q12H  . zinc sulfate  220 mg Oral Daily   Continuous Infusions: PRN Meds:.dextrose, guaiFENesin-codeine, hydrALAZINE, Ipratropium-Albuterol, oxyCODONE, sodium chloride flush  Diet Orders (From admission, onward)    Start     Ordered   01/24/21 0842  Diet Carb Modified Fluid consistency: Thin; Room service appropriate? Yes  Diet effective now       Question Answer Comment  Diet-HS Snack? Nothing   Calorie Level Medium 1600-2000   Fluid consistency: Thin   Room service appropriate? Yes      01/24/21 0841          DVT prophylaxis: heparin injection 5,000 Units Start: 01/25/21 1400     Code Status: Full Code  Family Communication: No family at bedside  Status is: Inpatient  Remains inpatient appropriate because:Unsafe d/c plan   Dispo: The patient is from: Home  Anticipated d/c is to: SNF              Anticipated d/c date is: 1 day              Patient currently is medically stable to d/c.   Difficult to place patient Yes   Level of care: Med-Surg  Consultants:  none  Procedures:  None   Microbiology  None   Antimicrobials: None     Objective: Vitals:   02/04/21 1433 02/04/21 2000 02/05/21 0300 02/05/21 1201  BP: (!) 107/48 (!) 110/55 111/62 (!) 108/49  Pulse: 84 93 85 85  Resp: '18  18 17 18  '$ Temp: 98.5 F (36.9 C) 98.4 F (36.9 C) 98 F (36.7 C) 98.3 F (36.8 C)  TempSrc: Oral Oral Oral Oral  SpO2: 98% 99% 100%   Weight:      Height:       No intake or output data in the 24 hours ending 02/05/21 1329 Filed Weights   02/01/21 2355 02/02/21 0300 02/03/21 1330  Weight: 79.3 kg 77.8 kg 78.5 kg    Examination:  Constitutional: NAD Respiratory: CTA biL, no wheezing  Cardiovascular: RRR, no mrg   Data Reviewed: I have independently reviewed following labs and imaging studies   CBC: Recent Labs  Lab 01/30/21 2258 02/02/21 0050 02/03/21 1347  WBC 3.6* 3.7* 3.0*  HGB 12.1* 11.0* 11.2*  HCT 39.0 35.8* 36.3*  MCV 93.5 94.7 95.8  PLT 169 181 [id number]   Basic Metabolic Panel: Recent Labs  Lab 01/30/21 2258 02/02/21 0050 02/03/21 1347  NA 130* 133* 134*  K 6.0* 5.6* 5.1  CL 95* 97* 96*  CO2 20* 20* 25  GLUCOSE 331* 163* 287*  BUN 73* 69* 53*  CREATININE 11.39* 10.22* 9.35*  CALCIUM 5.3* 5.7* 6.5*  PHOS 4.8* 4.5 4.3   Liver Function Tests: Recent Labs  Lab 01/30/21 2258 02/02/21 0050 02/02/21 0053 02/03/21 1347  AST  --  26 25  --   ALT  --  29 30  --   ALKPHOS  --  98 97  --   BILITOT  --  0.8 0.9  --   PROT  --  5.9* 6.1*  --   ALBUMIN 2.5* 2.4* 2.4* 2.4*   Coagulation Profile: No results for input(s): INR, PROTIME in the last 168 hours. HbA1C: No results for input(s): HGBA1C in the last 72 hours. CBG: Recent Labs  Lab 02/04/21 1042 02/04/21 1542 02/04/21 2058 02/05/21 0633 02/05/21 1159  GLUCAP 168* 276* 126* 100* 210*    No results found for this or any previous visit (from the past 240 hour(s)).   Radiology Studies: No results found.   Marzetta Board, MD, PhD [facility]  Between 7 am - 7 pm I am available, please contact me via Amion or Securechat  Between 7 pm - 7 am I am not available, please contact night coverage MD/APP via Amion

## 2021-02-06 DIAGNOSIS — U071 COVID-19: Secondary | ICD-10-CM | POA: Diagnosis not present

## 2021-02-06 LAB — GLUCOSE, CAPILLARY
Glucose-Capillary: 178 mg/dL — ABNORMAL HIGH (ref 70–99)
Glucose-Capillary: 139 mg/dL — ABNORMAL HIGH (ref 70–99)
Glucose-Capillary: 195 mg/dL — ABNORMAL HIGH (ref 70–99)
Glucose-Capillary: 167 mg/dL — ABNORMAL HIGH (ref 70–99)

## 2021-02-06 NOTE — Progress Notes (Signed)
Physical Therapy Treatment Patient Details Name: Jose Robinson MRN: [id number] DOB: [birth year] Today's Date: 02/06/2021    History of Present Illness 65yo with hx ESRD on TTS HD, CAD, PAD, chronic systolic chf, copd, DM, R BKA who presented with generalized weakness, found to have glucose just under 500. While in ED, became unresponsive and hypotensive, requiring IVF resuscitation. Tested covid pos.    PT Comments    Pt supine in bed on arrival.  Assisted patient in bathing and change of gown pre OOB.  Pt continues to require total assistance for application of R prosthetic.  He continues to require application without sock in place as device remains tight.  He performed increased gt distance and required decreased assistance from bed ( elevated surface ).  Continue to recommend HHPT with aide to assist with donning RLE prosthetic.    Follow Up Recommendations  Home health PT;Supervision for mobility/OOB     Equipment Recommendations  None recommended by PT    Recommendations for Other Services       Precautions / Restrictions Precautions Precautions: Fall Required Braces or Orthoses: Other Brace Other Brace: R prosthesis Restrictions Weight Bearing Restrictions: No    Mobility  Bed Mobility Overal bed mobility: Needs Assistance Bed Mobility: Rolling;Sidelying to Sit Rolling: Min assist Sidelying to sit: Mod assist       General bed mobility comments: Min to mod assistance to move into sitting.  Pt required log rolling to edge of bed  with posterior pelvic tilt and lean.  Transfers Overall transfer level: Needs assistance Equipment used: Rolling walker (2 wheeled) Transfers: Sit to/from Stand Sit to Stand: From elevated surface;Min guard         General transfer comment: Pt required decreased assistance from elevated surface.  Good translation into standing.  Pt continues to present with poor eccentric load.  Ambulation/Gait Ambulation/Gait assistance: Min  guard;Min assist (x1 LOB require min assistance to regaining balance.) Gait Distance (Feet): 160 Feet Assistive device: Rolling walker (2 wheeled) Gait Pattern/deviations: Step-through pattern;Decreased stride length;Trunk flexed;Wide base of support;Shuffle     General Gait Details: Intermittent shuffle when off balance.  Pt continues to require cues for scap retraction and upper trunk control.  Minor LOB x 1 required min assistance.   Stairs             Wheelchair Mobility    Modified Rankin (Stroke Patients Only)       Balance Overall balance assessment: Needs assistance Sitting-balance support: Single extremity supported;Feet supported Sitting balance-Leahy Scale: Fair Sitting balance - Comments: EOB without UE support   Standing balance support: Bilateral upper extremity supported Standing balance-Leahy Scale: Poor Standing balance comment: RW for standing and gait                            Cognition Arousal/Alertness: Awake/alert Behavior During Therapy: WFL for tasks assessed/performed Overall Cognitive Status: Within Functional Limits for tasks assessed                                        Exercises General Exercises - Lower Extremity Long Arc Quad: AROM;Both;Seated;20 reps    General Comments        Pertinent Vitals/Pain Pain Assessment: No/denies pain    Home Living  Prior Function            PT Goals (current goals can now be found in the care plan section) Acute Rehab PT Goals Patient Stated Goal: return home Potential to Achieve Goals: Good Progress towards PT goals: Progressing toward goals    Frequency    Min 3X/week      PT Plan Current plan remains appropriate    Co-evaluation              AM-PAC PT "6 Clicks" Mobility   Outcome Measure  Help needed turning from your back to your side while in a flat bed without using bedrails?: A Little Help needed moving  from lying on your back to sitting on the side of a flat bed without using bedrails?: A Lot Help needed moving to and from a bed to a chair (including a wheelchair)?: A Little Help needed standing up from a chair using your arms (e.g., wheelchair or bedside chair)?: A Little Help needed to walk in hospital room?: A Little Help needed climbing 3-5 steps with a railing? : A Lot 6 Click Score: 16    End of Session Equipment Utilized During Treatment: Gait belt Activity Tolerance: Patient tolerated treatment well Patient left: in chair;with call bell/phone within reach Nurse Communication: Mobility status PT Visit Diagnosis: Unsteadiness on feet (R26.81);Muscle weakness (generalized) (M62.81);Difficulty in walking, not elsewhere classified (R26.2)     Time: [id number] PT Time Calculation (min) (ACUTE ONLY): 35 min  Charges:  $Gait Training: 8-22 mins $Therapeutic Activity: 8-22 mins                     Jose Robinson , PTA Acute Rehabilitation Services Pager [phone] Office [phone]     Jose Robinson 02/06/2021, 11:39 AM

## 2021-02-06 NOTE — Progress Notes (Signed)
Subjective: Seen in room, feeling better after 3 L off on HD yest.   Objective Vital signs in last 24 hours: Vitals:   02/06/21 0002 02/06/21 0500 02/06/21 0810 02/06/21 1347  BP: 138/78 115/64 116/66 (!) 114/59  Pulse: 81 75 79 78  Resp: '17 18 18 18  '$ Temp: 98.6 F (37 C) 98.6 F (37 C) 98.3 F (36.8 C) 98.1 F (36.7 C)  TempSrc: Oral Oral Oral Oral  SpO2: 96% 98% 98% 93%  Weight:      Height:       Weight change:   Physical Exam General: Alert, chronically ill appearing male in NAD Heart:RRR, no mrg Lungs  CTAB, slightly decreased in bases, nml WOB on RA Abdomen:soft, NTND Extremities:no LE edema, R BKA Dialysis Access: [facility] c/d/i   Dialysis Orders: TTS NW 4h 400/ 800 68.5kg 2/ 3.5Ca TDC Hep 4000 - hect 1ug - last HD 1/25  CXR 1/25 - IS edema bilat + patchy bilat infiltrates  Problem/Plan:  1. DKA/DM 2- resolved, per pmd 2. COVID 19 pna - CXR 1/25 w/ infiltrates,currently on RA.  Continued on isolation. S/p remdesivir. 3. Syncope -related to #1 and #2 4. ^trop/ hx CAD- no further w/u per cards 5. ESRD - TTS HD.Next HD Wed off schedule. Treatments here are shortened due to high census/nursing shortage.  Use 10 mg midodrine pre HD.  6. PAD -s/p R BKA, bilat finger amps. Per pmd 7. BP/ volume -BP soft. Up 10kg still if wts are right. Not that vol overloaded on exam. Max UF w/ HD tomorrow.  8. MBD ckd -Corrected calciumlow. Using3.5Ca bath with HD, calcium carbonate started. Continue Ca based binders and increased hectorol at 66mg qHD. Check pth.  9. Anemia of ckd - Hb 11.2,no ESA indicated at this time. 10. Dispo -caregiver no longer able to handle patient and will require placement. SW/CM and renal navigator involved.   RKelly Splinter MD 02/06/2021, 4:18 PM     Labs: Basic Metabolic Panel: Recent Labs  Lab 01/30/21 2258 02/02/21 0050 02/03/21 1347  NA 130* 133* 134*  K 6.0* 5.6* 5.1  CL 95* 97* 96*  CO2 20* 20* 25  GLUCOSE  331* 163* 287*  BUN 73* 69* 53*  CREATININE 11.39* 10.22* 9.35*  CALCIUM 5.3* 5.7* 6.5*  PHOS 4.8* 4.5 4.3   Liver Function Tests: Recent Labs  Lab 02/02/21 0050 02/02/21 0053 02/03/21 1347  AST 26 25  --   ALT 29 30  --   ALKPHOS 98 97  --   BILITOT 0.8 0.9  --   PROT 5.9* 6.1*  --   ALBUMIN 2.4* 2.4* 2.4*   Recent Labs  Lab 02/02/21 0050  LIPASE 29   No results for input(s): AMMONIA in the last 168 hours. CBC: Recent Labs  Lab 01/30/21 2258 02/02/21 0050 02/03/21 1347  WBC 3.6* 3.7* 3.0*  HGB 12.1* 11.0* 11.2*  HCT 39.0 35.8* 36.3*  MCV 93.5 94.7 95.8  PLT 169 181 190   Cardiac Enzymes: No results for input(s): CKTOTAL, CKMB, CKMBINDEX, TROPONINI in the last 168 hours. CBG: Recent Labs  Lab 02/05/21 1651 02/05/21 2357 02/06/21 0641 02/06/21 1120 02/06/21 1555  GLUCAP 130* 181* 178* 139* 195*    Studies/Results: No results found. Medications:  . vitamin C  500 mg Oral Daily  . aspirin  81 mg Oral Daily  . atorvastatin  20 mg Oral Daily  . calcium acetate  1,334 mg Oral TID WC  . calcium carbonate  1,250  mg Oral QPM  . Chlorhexidine Gluconate Cloth  6 each Topical Q0600  . cholecalciferol  1,000 Units Oral Daily  . doxercalciferol  2 mcg Intravenous Q T,Th,Sa-HD  . gabapentin  100 mg Oral TID  . heparin injection (subcutaneous)  5,000 Units Subcutaneous Q8H  . insulin aspart  0-5 Units Subcutaneous QHS  . insulin aspart  0-9 Units Subcutaneous TID WC  . insulin aspart  3 Units Subcutaneous TID WC  . insulin glargine  6 Units Subcutaneous Daily  . melatonin  3 mg Oral Once  . midodrine  10 mg Oral TID  . pantoprazole  20 mg Oral Daily  . sodium chloride flush  10-40 mL Intracatheter Q12H  . zinc sulfate  220 mg Oral Daily

## 2021-02-06 NOTE — TOC Progression Note (Signed)
Transition of Care [facility]) - Progression Note    Patient Details  Name: RANDLE ZEBROWSKI MRN: [id number] Date of Birth: November 01, 1956  [facility] CM/SW [place], [place] Phone Number: 02/06/2021, 7:55 AM  Clinical Narrative:    CSW spoke with Hydia at [facility] [facility] on 02/05/21.  They are reviewing his clinicals and will contact CSW.  CSW will follow up with [facility] for possible placement today. TOC will continue to assist with disposition planning.   Expected Discharge Plan: Assisted Living (vs SNF) Barriers to Discharge: No SNF bed  Expected Discharge Plan and Services Expected Discharge Plan: Assisted Living (vs SNF) In-house Referral: NA Discharge Planning Services: CM Consult Post Acute Care Choice: Home Health,Durable Medical Equipment Living arrangements for the past 2 months: Single Family Home                 DME Arranged: Glucometer DME Agency: NA       HH Arranged: RN,PT,OT,Nurse's Aide HH Agency: [facility] Date [place]: 01/29/21 Time [place]: [place] Representative spoke with at [place]: [place] ([place]) Interventions    Readmission Risk Interventions No flowsheet data found.

## 2021-02-06 NOTE — Progress Notes (Signed)
PROGRESS NOTE  Jose Robinson [id number] DOB: [birth year] DOA: 01/23/2021 PCP: Pcp, No   LOS: 13 days   Brief Narrative / Interim history: 64yo with hx ESRD on TTS HD, CAD, PAD, chronic systolic chf, copd, DM who presented with generalized weakness, found to have glucose just under 500. While in ED, became unresponsive and hypotensive, requiring IVF resuscitation. Tested covid pos. CCM consulted with pt later requiring pressor support. Now off pressors and transferred to [facility].  Clinically returned to baseline awaiting placement, he used to live in a long-term SNF, went to his friend's house for 3 weeks then was hospitalized again.  Now his friend wants to take him back and essentially is homeless  Subjective / 24h Interval events: No significant complaints today  Assessment & Plan: Principal Problem Hyperglycemia, early DKA, insulin-dependent diabetes mellitus-DKA has resolved, patient is to be continued on subcutaneous insulin, glucose trends have been stable.  Hypoglycemic episodes improved, now hyperglycemic especially at the end of the day last night.  Added mealtime with NovoLog 3 units, CBGs reviewed and stable today  CBG (last 3)  Recent Labs    02/05/21 2357 02/06/21 0641 02/06/21 1120  GLUCAP 181* 178* 139*   Active Problems COVID-19 viral infection-asymptomatic, status post Remdesivir.  Stable.  Given no hypoxia did not get steroids, and few symptoms will only need 10 days of isolation.  Initially tested positive on 1/25, off precaution 2/5 Syncope-secondary to hypotension, resolved Lactic acidosis-resolved Elevated troponin, underlying CAD-high-sensitivity troponin 59, seen by cardiology, no further work-up recommended at this time. Elevated LFTs-due to COVID-19, trending down ESRD on HD TTS-nephrology following Chronic systolic CHF-volume management per dialysis COPD-no wheezing, respiratory status stable  Needs placement  Scheduled Meds: . vitamin C  500 mg Oral  Daily  . aspirin  81 mg Oral Daily  . atorvastatin  20 mg Oral Daily  . calcium acetate  1,334 mg Oral TID WC  . calcium carbonate  1,250 mg Oral QPM  . Chlorhexidine Gluconate Cloth  6 each Topical Q0600  . cholecalciferol  1,000 Units Oral Daily  . doxercalciferol  2 mcg Intravenous Q T,Th,Sa-HD  . gabapentin  100 mg Oral TID  . heparin injection (subcutaneous)  5,000 Units Subcutaneous Q8H  . insulin aspart  0-5 Units Subcutaneous QHS  . insulin aspart  0-9 Units Subcutaneous TID WC  . insulin aspart  3 Units Subcutaneous TID WC  . insulin glargine  6 Units Subcutaneous Daily  . melatonin  3 mg Oral Once  . midodrine  10 mg Oral TID  . pantoprazole  20 mg Oral Daily  . sodium chloride flush  10-40 mL Intracatheter Q12H  . zinc sulfate  220 mg Oral Daily   Continuous Infusions: PRN Meds:.dextrose, guaiFENesin-codeine, hydrALAZINE, Ipratropium-Albuterol, oxyCODONE, sodium chloride flush  Diet Orders (From admission, onward)    Start     Ordered   02/05/21 1524  Diet renal/carb modified with fluid restriction Diet-HS Snack? Nothing; Fluid restriction: 1200 mL Fluid; Room service appropriate? Yes; Fluid consistency: Thin  Diet effective now       Question Answer Comment  Diet-HS Snack? Nothing   Fluid restriction: 1200 mL Fluid   Room service appropriate? Yes   Fluid consistency: Thin      02/05/21 1523          DVT prophylaxis: heparin injection 5,000 Units Start: 01/25/21 1400     Code Status: Full Code  Family Communication: No family at bedside  Status is: Inpatient  Remains inpatient  appropriate because:Unsafe d/c plan   Dispo: The patient is from: Home              Anticipated d/c is to: SNF              Anticipated d/c date is: 1 day              Patient currently is medically stable to d/c.   Difficult to place patient Yes   Level of care: Med-Surg  Consultants:  none  Procedures:  None   Microbiology  None   Antimicrobials: None      Objective: Vitals:   02/05/21 2226 02/06/21 0002 02/06/21 0500 02/06/21 0810  BP: 125/76 138/78 115/64 116/66  Pulse: 82 81 75 79  Resp: '15 17 18 18  '$ Temp: 98.4 F (36.9 C) 98.6 F (37 C) 98.6 F (37 C) 98.3 F (36.8 C)  TempSrc:  Oral Oral Oral  SpO2: 94% 96% 98% 98%  Weight:      Height:        Intake/Output Summary (Last 24 hours) at 02/06/2021 1123 Last data filed at 02/06/2021 0900 Gross per 24 hour  Intake 880 ml  Output 3000 ml  Net -2120 ml   Filed Weights   02/01/21 2355 02/02/21 0300 02/03/21 1330  Weight: 79.3 kg 77.8 kg 78.5 kg    Examination:  Constitutional: No distress Respiratory: Clear, no wheezing Cardiovascular: Regular rate and rhythm, no murmurs   Data Reviewed: I have independently reviewed following labs and imaging studies   CBC: Recent Labs  Lab 01/30/21 2258 02/02/21 0050 02/03/21 1347  WBC 3.6* 3.7* 3.0*  HGB 12.1* 11.0* 11.2*  HCT 39.0 35.8* 36.3*  MCV 93.5 94.7 95.8  PLT 169 181 [id number]   Basic Metabolic Panel: Recent Labs  Lab 01/30/21 2258 02/02/21 0050 02/03/21 1347  NA 130* 133* 134*  K 6.0* 5.6* 5.1  CL 95* 97* 96*  CO2 20* 20* 25  GLUCOSE 331* 163* 287*  BUN 73* 69* 53*  CREATININE 11.39* 10.22* 9.35*  CALCIUM 5.3* 5.7* 6.5*  PHOS 4.8* 4.5 4.3   Liver Function Tests: Recent Labs  Lab 01/30/21 2258 02/02/21 0050 02/02/21 0053 02/03/21 1347  AST  --  26 25  --   ALT  --  29 30  --   ALKPHOS  --  98 97  --   BILITOT  --  0.8 0.9  --   PROT  --  5.9* 6.1*  --   ALBUMIN 2.5* 2.4* 2.4* 2.4*   Coagulation Profile: No results for input(s): INR, PROTIME in the last 168 hours. HbA1C: No results for input(s): HGBA1C in the last 72 hours. CBG: Recent Labs  Lab 02/05/21 1159 02/05/21 1651 02/05/21 2357 02/06/21 0641 02/06/21 1120  GLUCAP 210* 130* 181* 178* 139*    No results found for this or any previous visit (from the past 240 hour(s)).   Radiology Studies: No results found.   Marzetta Board,  MD, PhD [facility]  Between 7 am - 7 pm I am available, please contact me via Amion or Securechat  Between 7 pm - 7 am I am not available, please contact night coverage MD/APP via Amion

## 2021-02-06 NOTE — Progress Notes (Signed)
Pt transferred to 5n17 as ordered. Pt remains alert/oriented in no apparent distress. No complaints voiced.

## 2021-02-06 NOTE — Plan of Care (Signed)

## 2021-02-06 NOTE — Progress Notes (Signed)
Nutrition Brief Note  RD pulled to chart secondary to LOS.  Wt Readings from Last 15 Encounters:  02/03/21 78.5 kg   65 yo man with hx of DM, HTN, ESRD on HD, here with weakness, SOB, cough, COVID 19 PNA, episode of syncope and intermittent hypotension  Pt admitted with hypotension and DKA.   Reviewed I/O's: -2 L x 24 hours and -2.1 L since admission  Pt unavailable at time of visit.   Reviewed wt hx; no wt loss noted. Per nephrology notes, EDW 68.5 kg.   Medications reviewed and include phoslo, calcium carbonate, and melatonin.  Per MD notes, pt is medically stable for discharge, however, awaiting SNF placement.   Lab Results  Component Value Date   HGBA1C 10.4 (H) 01/24/2021   PTA DM medications are 10 units semglee [id number] mg trulicity weekly, and 10 units novolog PRN.  Labs reviewed: CBGS: 130-210 (inpatient orders for glycemic control are 0-5 units insulin aspart daily at bedtime, 0-9 units insulin aspart TID with meal,s 3 units insulin aspart TID with meals, and 6 units insulin glargine daily).   Current diet order is renal/ carb modified with 1.2 L fluid restriction, patient is consuming approximately 100% of meals at this time. Labs and medications reviewed.   No nutrition interventions warranted at this time. If nutrition issues arise, please consult RD.   Loistine Chance, RD, LDN, [place] Registered Dietitian II Certified Diabetes Care and Education Specialist Please refer to [facility]) for RD and/or RD on-call/weekend/after hours pager

## 2021-02-07 ENCOUNTER — Other Ambulatory Visit: Payer: Self-pay

## 2021-02-07 ENCOUNTER — Inpatient Hospital Stay (HOSPITAL_COMMUNITY): Payer: Medicaid Other

## 2021-02-07 DIAGNOSIS — E0810 Diabetes mellitus due to underlying condition with ketoacidosis without coma: Secondary | ICD-10-CM

## 2021-02-07 DIAGNOSIS — E11649 Type 2 diabetes mellitus with hypoglycemia without coma: Secondary | ICD-10-CM

## 2021-02-07 DIAGNOSIS — U071 COVID-19: Secondary | ICD-10-CM

## 2021-02-07 DIAGNOSIS — N186 End stage renal disease: Secondary | ICD-10-CM

## 2021-02-07 DIAGNOSIS — I5022 Chronic systolic (congestive) heart failure: Secondary | ICD-10-CM

## 2021-02-07 DIAGNOSIS — E1310 Other specified diabetes mellitus with ketoacidosis without coma: Secondary | ICD-10-CM

## 2021-02-07 DIAGNOSIS — R7989 Other specified abnormal findings of blood chemistry: Secondary | ICD-10-CM

## 2021-02-07 DIAGNOSIS — Z992 Dependence on renal dialysis: Secondary | ICD-10-CM

## 2021-02-07 DIAGNOSIS — E1122 Type 2 diabetes mellitus with diabetic chronic kidney disease: Secondary | ICD-10-CM

## 2021-02-07 DIAGNOSIS — E872 Acidosis: Secondary | ICD-10-CM

## 2021-02-07 LAB — GLUCOSE, CAPILLARY
Glucose-Capillary: 107 mg/dL — ABNORMAL HIGH (ref 70–99)
Glucose-Capillary: 90 mg/dL (ref 70–99)
Glucose-Capillary: 98 mg/dL (ref 70–99)
Glucose-Capillary: 194 mg/dL — ABNORMAL HIGH (ref 70–99)
Glucose-Capillary: 192 mg/dL — ABNORMAL HIGH (ref 70–99)

## 2021-02-07 LAB — BASIC METABOLIC PANEL
Anion gap: 12 (ref 5–15)
BUN: 57 mg/dL — ABNORMAL HIGH (ref 8–23)
CO2: 25 mmol/L (ref 22–32)
Calcium: 7.7 mg/dL — ABNORMAL LOW (ref 8.9–10.3)
Chloride: 98 mmol/L (ref 98–111)
Creatinine, Ser: 8.16 mg/dL — ABNORMAL HIGH (ref 0.61–1.24)
GFR, Estimated: 7 mL/min — ABNORMAL LOW (ref >60)
Glucose, Bld: 174 mg/dL — ABNORMAL HIGH (ref 70–99)
Potassium: 5.3 mmol/L — ABNORMAL HIGH (ref 3.5–5.1)
Sodium: 135 mmol/L (ref 135–145)

## 2021-02-07 MED ORDER — HEPARIN SODIUM (PORCINE) 1000 UNIT/ML IJ SOLN
INTRAMUSCULAR | Status: AC
  Administered 2021-02-07: 4800 [IU] via INTRAVENOUS_CENTRAL
  Filled 2021-02-07: qty 5

## 2021-02-07 MED ORDER — HEPARIN SODIUM (PORCINE) 1000 UNIT/ML DIALYSIS: 4000.0000 [IU] | Freq: Once | INTRAMUSCULAR | Status: AC

## 2021-02-07 MED ORDER — TUBERCULIN PPD 5 UNIT/0.1ML ID SOLN
5.0000 [IU] | Freq: Once | INTRADERMAL | Status: DC
  Filled 2021-02-07: qty 0.1

## 2021-02-07 MED ORDER — DOXERCALCIFEROL 4 MCG/2ML IV SOLN
INTRAVENOUS | Status: AC
  Administered 2021-02-07: 2 ug via INTRAVENOUS
  Filled 2021-02-07: qty 2

## 2021-02-07 MED ORDER — MIDODRINE HCL 5 MG PO TABS
ORAL_TABLET | ORAL | Status: AC
  Administered 2021-02-07: 10 mg via ORAL
  Filled 2021-02-07: qty 2

## 2021-02-07 NOTE — Plan of Care (Signed)
  Problem: Education: Goal: Knowledge of General Education information will improve Description: Including pain rating scale, medication(s)/side effects and non-pharmacologic comfort measures Outcome: Progressing   Problem: Clinical Measurements: Goal: Respiratory complications will improve Outcome: Progressing   Problem: Activity: Goal: Risk for activity intolerance will decrease Outcome: Progressing   Problem: Nutrition: Goal: Adequate nutrition will be maintained Outcome: Progressing   Problem: Elimination: Goal: Will not experience complications related to bowel motility Outcome: Progressing   Problem: Pain Managment: Goal: General experience of comfort will improve Outcome: Progressing   Problem: Safety: Goal: Ability to remain free from injury will improve Outcome: Progressing

## 2021-02-07 NOTE — TOC Transition Note (Deleted)
Transition of Care [facility]) - CM/SW Discharge Note   Patient Details  Name: TREYDAN MADEJA MRN: [id number] Date of Birth: 1956-07-02  Transition of Care [facility]) CM/SW Contact:  Sharin Mons, RN Phone Number: 02/07/2021, 1:40 PM   Clinical Narrative:    Pt states doesn't drink or use drugs.     Barriers to Discharge: No SNF bed   Patient Goals and CMS Choice Patient states their goals for this hospitalization and ongoing recovery are:: return home to previous living arrangements CMS Medicare.gov Compare Post Acute Care list provided to:: Patient Choice offered to / list presented to : Patient  Discharge Placement                       Discharge Plan and Services In-house Referral: NA Discharge Planning Services: CM Consult Post Acute Care Choice: Home Health,Durable Medical Equipment          DME Arranged: Glucometer DME Agency: NA       HH Arranged: RN,PT,OT,Nurse's Aide HH Agency: [facility] Date HH Agency Contacted: 01/29/21 Time [place]: [id number] Representative spoke with at [place]: [place] ([place]) Interventions     Readmission Risk Interventions No flowsheet data found.

## 2021-02-07 NOTE — Progress Notes (Signed)
[facility] PROGRESS NOTE    Progress Note  Jose Robinson  [id number] DOB: 29-Dec-1956 DOA: 01/23/2021 PCP: Pcp, No     Brief Narrative:   Jose Robinson is an 65 y.o. male past medical history of end-stage renal disease with dialyzes Tuesday Thursdays and Saturdays, coronary artery disease, chronic systolic heart failure COPD uncontrolled diabetes mellitus presents to the ED she was found to have a blood glucose of 500 in the ED she was found to be hypotensive and unresponsive tested positive for Covid Jose Robinson was consulted as she required pressors.  He is, off pressors and now transferred to [facility] clinically appears to be at baseline  Awaiting placement to skilled nursing facility  Assessment/Plan:   Hyper glycemia/DKA (diabetic ketoacidosis) ([place] DKA has resolved, he had episode of hypoglycemia. Insulin plus sliding scale.  Incidental COVID-19 virus infection No hypoxia no steroids required he came off precautions on 02/03/2021, has completed his 3-day course of remdesivir.  Lactic acidosis: Likely due to DKA now resolved.  Elevated troponin: Likely due to demand ischemia now resolved.  Elevated LFTs: Question due to Covid and hypotension has resolved.  End-stage renal disease on hemodialysis: Continue HD per renal.  Chronic systolic heart failure: Continue volume management per dialysis.  DVT prophylaxis: lovenxo Family Communication:none Status is: Inpatient  Remains inpatient appropriate because:Hemodynamically unstable   Dispo: The patient is from: Home              Anticipated d/c is to: SNF              Anticipated d/c date is: > 3 days              Patient currently is medically stable to d/c.   Difficult to place patient Yes        Code Status:     Code Status Orders  (From admission, onward)         Start     Ordered   01/24/21 0003  Full code  Continuous        01/24/21 0005        Code Status History    This patient has a  current code status but no historical code status.   Advance Care Planning Activity        IV Access:    Peripheral IV   Procedures and diagnostic studies:   No results found.   Medical Consultants:    None.   Subjective:    Jose Robinson no complaints.  Objective:    Vitals:   02/07/21 0915 02/07/21 0930 02/07/21 1000 02/07/21 1043  BP: (!) 77/35 (!) 86/46 (!) 105/59 95/63  Pulse:    85  Resp:  12  18  Temp:   97.7 F (36.5 C) 98.3 F (36.8 C)  TempSrc:   Oral Oral  SpO2:   96% 97%  Weight:   74.4 kg   Height:       SpO2: 97 % O2 Flow Rate (L/min): 2 L/min   Intake/Output Summary (Last 24 hours) at 02/07/2021 1205 Last data filed at 02/07/2021 1000 Gross per 24 hour  Intake --  Output 2727 ml  Net -2727 ml   Filed Weights   02/03/21 1330 02/07/21 0721 02/07/21 1000  Weight: 78.5 kg 77.2 kg 74.4 kg    Exam: General exam: In no acute distress. Respiratory system: Good air movement and clear to auscultation. Cardiovascular system: S1 & S2 heard, RRR. No JVD. Gastrointestinal system: Abdomen is nondistended,  soft and nontender.  Extremities: No pedal edema. Skin: No rashes, lesions or ulcers Psychiatry: Judgement and insight appear normal. Mood & affect appropriate.    Data Reviewed:    Labs: Basic Metabolic Panel: Recent Labs  Lab 02/02/21 0050 02/03/21 1347 02/07/21 0328  NA 133* 134* 135  K 5.6* 5.1 5.3*  CL 97* 96* 98  CO2 20* 25 25  GLUCOSE 163* 287* 174*  BUN 69* 53* 57*  CREATININE 10.22* 9.35* 8.16*  CALCIUM 5.7* 6.5* 7.7*  PHOS 4.5 4.3  --    GFR Estimated Creatinine Clearance: 9.9 mL/min (A) (by C-G formula based on SCr of 8.16 mg/dL (H)). Liver Function Tests: Recent Labs  Lab 02/02/21 0050 02/02/21 0053 02/03/21 1347  AST 26 25  --   ALT 29 30  --   ALKPHOS 98 97  --   BILITOT 0.8 0.9  --   PROT 5.9* 6.1*  --   ALBUMIN 2.4* 2.4* 2.4*   Recent Labs  Lab 02/02/21 0050  LIPASE 29   No results for  input(s): AMMONIA in the last 168 hours. Coagulation profile No results for input(s): INR, PROTIME in the last 168 hours. COVID-19 Labs  No results for input(s): DDIMER, FERRITIN, LDH, CRP in the last 72 hours.  Lab Results  Component Value Date   SARSCOV2NAA POSITIVE (A) 01/23/2021    CBC: Recent Labs  Lab 02/02/21 0050 02/03/21 1347  WBC 3.7* 3.0*  HGB 11.0* 11.2*  HCT 35.8* 36.3*  MCV 94.7 95.8  PLT 181 190   Cardiac Enzymes: No results for input(s): CKTOTAL, CKMB, CKMBINDEX, TROPONINI in the last 168 hours. BNP (last 3 results) No results for input(s): PROBNP in the last 8760 hours. CBG: Recent Labs  Lab 02/06/21 1120 02/06/21 1555 02/06/21 2228 02/07/21 0703 02/07/21 1129  GLUCAP 139* 195* 167* 107* 90   D-Dimer: No results for input(s): DDIMER in the last 72 hours. Hgb A1c: No results for input(s): HGBA1C in the last 72 hours. Lipid Profile: No results for input(s): CHOL, HDL, LDLCALC, TRIG, CHOLHDL, LDLDIRECT in the last 72 hours. Thyroid function studies: No results for input(s): TSH, T4TOTAL, T3FREE, THYROIDAB in the last 72 hours.  Invalid input(s): FREET3 Anemia work up: No results for input(s): VITAMINB12, FOLATE, FERRITIN, TIBC, IRON, RETICCTPCT in the last 72 hours. Sepsis Labs: Recent Labs  Lab 02/02/21 0050 02/03/21 1347  WBC 3.7* 3.0*   Microbiology No results found for this or any previous visit (from the past 240 hour(s)).   Medications:   . vitamin C  500 mg Oral Daily  . aspirin  81 mg Oral Daily  . atorvastatin  20 mg Oral Daily  . calcium acetate  1,334 mg Oral TID WC  . calcium carbonate  1,250 mg Oral QPM  . Chlorhexidine Gluconate Cloth  6 each Topical Q0600  . cholecalciferol  1,000 Units Oral Daily  . doxercalciferol  2 mcg Intravenous Q T,Th,Sa-HD  . gabapentin  100 mg Oral TID  . heparin injection (subcutaneous)  5,000 Units Subcutaneous Q8H  . insulin aspart  0-5 Units Subcutaneous QHS  . insulin aspart  0-9 Units  Subcutaneous TID WC  . insulin aspart  3 Units Subcutaneous TID WC  . insulin glargine  6 Units Subcutaneous Daily  . melatonin  3 mg Oral Once  . midodrine  10 mg Oral TID  . pantoprazole  20 mg Oral Daily  . sodium chloride flush  10-40 mL Intracatheter Q12H  . zinc sulfate  220 mg  Oral Daily   Continuous Infusions:    LOS: 14 days   Charlynne Cousins  [facility]  02/07/2021, 12:05 PM

## 2021-02-07 NOTE — Progress Notes (Addendum)
Subjective: Seen on HD, no c/o's.   Objective Vital signs in last 24 hours: Vitals:   02/07/21 0915 02/07/21 0930 02/07/21 1000 02/07/21 1043  BP: (!) 77/35 (!) 86/46 (!) 105/59 95/63  Pulse:    85  Resp:  12  18  Temp:   97.7 F (36.5 C) 98.3 F (36.8 C)  TempSrc:   Oral Oral  SpO2:   96% 97%  Weight:   74.4 kg   Height:       Weight change:   Physical Exam General: Alert, chronically ill appearing male in NAD Heart:RRR, no mrg Lungs  CTAB, slightly decreased in bases, nml WOB on RA Abdomen:soft, NTND Extremities:no LE edema, R BKA Dialysis Access: [facility] c/d/i   OP HD: TTS NW 4h 400/ 800 68.5kg 2/ 3.5Ca TDC Hep 4000 - hect 1ug - last HD 1/25  CXR 1/25 - IS edema bilat + patchy bilat infiltrates  Problem/Plan: 1. DKA/DM 2- resolved, per pmd 2. COVID 19 pna - CXR 1/25 w/ infiltrates,currently on RA.  Continued on isolation. S/p remdesivir. 3. Syncope -related to #1 and #2 4. ^trop/ hx CAD- no further w/u per cards 5. ESRD - TTS HD.Next HD today off schedule. HD tomorrow to get back on schedule. Treatments here are shortened due to high census/nursing shortage.  Use 10 mg midodrine pre HD.  6. PAD -hx of R BKA, bilat finger amps 7. BP/ volume -BP soft. 6kg up after HD this am. Not that vol overloaded on exam. Cont to lower vol w/ HD as tol.   8. MBD ckd -Corrected calciumlow. Using3.5Ca bath with HD, calcium carbonate started. Continue Ca based binders and increased hectorol at 57mg qHD. Check pth.  9. Anemia of ckd - Hb 11.2,no ESA indicated at this time. 10. Dispo -caregiver no longer able to handle patient and will require placement. Awaiting SNF placement.    RKelly Splinter MD 02/07/2021, 11:38 AM     Labs: Basic Metabolic Panel: Recent Labs  Lab 02/02/21 0050 02/03/21 1347 02/07/21 0328  NA 133* 134* 135  K 5.6* 5.1 5.3*  CL 97* 96* 98  CO2 20* 25 25  GLUCOSE 163* 287* 174*  BUN 69* 53* 57*  CREATININE 10.22* 9.35* 8.16*   CALCIUM 5.7* 6.5* 7.7*  PHOS 4.5 4.3  --    Liver Function Tests: Recent Labs  Lab 02/02/21 0050 02/02/21 0053 02/03/21 1347  AST 26 25  --   ALT 29 30  --   ALKPHOS 98 97  --   BILITOT 0.8 0.9  --   PROT 5.9* 6.1*  --   ALBUMIN 2.4* 2.4* 2.4*   Recent Labs  Lab 02/02/21 0050  LIPASE 29   No results for input(s): AMMONIA in the last 168 hours. CBC: Recent Labs  Lab 02/02/21 0050 02/03/21 1347  WBC 3.7* 3.0*  HGB 11.0* 11.2*  HCT 35.8* 36.3*  MCV 94.7 95.8  PLT 181 190   Cardiac Enzymes: No results for input(s): CKTOTAL, CKMB, CKMBINDEX, TROPONINI in the last 168 hours. CBG: Recent Labs  Lab 02/06/21 1120 02/06/21 1555 02/06/21 2228 02/07/21 0703 02/07/21 1129  GLUCAP 139* 195* 167* 107* 90    Studies/Results: No results found. Medications:  . vitamin C  500 mg Oral Daily  . aspirin  81 mg Oral Daily  . atorvastatin  20 mg Oral Daily  . calcium acetate  1,334 mg Oral TID WC  . calcium carbonate  1,250 mg Oral QPM  . Chlorhexidine Gluconate Cloth  6 each Topical Q0600  . cholecalciferol  1,000 Units Oral Daily  . doxercalciferol  2 mcg Intravenous Q T,Th,Sa-HD  . gabapentin  100 mg Oral TID  . heparin injection (subcutaneous)  5,000 Units Subcutaneous Q8H  . insulin aspart  0-5 Units Subcutaneous QHS  . insulin aspart  0-9 Units Subcutaneous TID WC  . insulin aspart  3 Units Subcutaneous TID WC  . insulin glargine  6 Units Subcutaneous Daily  . melatonin  3 mg Oral Once  . midodrine  10 mg Oral TID  . pantoprazole  20 mg Oral Daily  . sodium chloride flush  10-40 mL Intracatheter Q12H  . zinc sulfate  220 mg Oral Daily

## 2021-02-07 NOTE — TOC Progression Note (Addendum)
Transition of Care [facility]) - Progression Note    Patient Details  Name: Jose Robinson MRN: [id number] Date of Birth: 04-Mar-1956  Transition of Care [facility]) CM/SW [place], [place] Phone Number: 02/07/2021, 1:41 PM  Clinical Narrative:    CSW left message for Jose Robinson with admission for [facility] to return phone call.  TOC team will continue to assist with disposition planning. Update: Jose Robinson with Jose Robinson will have a face to face with pt on Thursday, morning '@8'$ :00am for possible SNF placement for Friday.   Expected Discharge Plan: Assisted Living (vs SNF) Barriers to Discharge: No SNF bed  Expected Discharge Plan and Services Expected Discharge Plan: Assisted Living (vs SNF) In-house Referral: NA Discharge Planning Services: CM Consult Post Acute Care Choice: Home Health,Durable Medical Equipment Living arrangements for the past 2 months: Single Family Home                 DME Arranged: Glucometer DME Agency: NA       HH Arranged: RN,PT,OT,Nurse's Aide HH Agency: [facility] Date [place]: 01/29/21 Time [place]: [place] Representative spoke with at [place]: [place] ([place]) Interventions    Readmission Risk Interventions No flowsheet data found.

## 2021-02-07 NOTE — TOC CAGE-AID Note (Signed)
Transition of Care [facility]) - CAGE-AID Screening   Patient Details  Name: Jose Robinson MRN: [id number] Date of Birth: [birth year]  [facility]) CM/SW Contact:    Sharin Mons, RN Phone Number: 02/07/2021, 1:41 PM   Clinical Narrative:  Pt states doesn't drink or use drugs.  CAGE-AID Screening:    Have You Ever Felt You Ought to Cut Down on Your Drinking or Drug Use?: No Have People Annoyed You By Critizing Your Drinking Or Drug Use?: No Have You Felt Bad Or Guilty About Your Drinking Or Drug Use?: No Have You Ever Had a Drink or Used Drugs First Thing In The Morning to Steady Your Nerves or to Get Rid of a Hangover?: No CAGE-AID Score: 0  Substance Abuse Education Offered: No

## 2021-02-08 DIAGNOSIS — E0810 Diabetes mellitus due to underlying condition with ketoacidosis without coma: Secondary | ICD-10-CM | POA: Diagnosis not present

## 2021-02-08 LAB — CBC
HCT: 37.8 % — ABNORMAL LOW (ref 39.0–52.0)
HCT: 33.8 % — ABNORMAL LOW (ref 39.0–52.0)
Hemoglobin: 12 g/dL — ABNORMAL LOW (ref 13.0–17.0)
Hemoglobin: 10 g/dL — ABNORMAL LOW (ref 13.0–17.0)
MCH: 30.5 pg (ref 26.0–34.0)
MCH: 29.4 pg (ref 26.0–34.0)
MCHC: 31.7 g/dL (ref 30.0–36.0)
MCHC: 29.6 g/dL — ABNORMAL LOW (ref 30.0–36.0)
MCV: 95.9 fL (ref 80.0–100.0)
MCV: 99.4 fL (ref 80.0–100.0)
Platelets: 245 10*3/uL (ref 150–400)
Platelets: 204 10*3/uL (ref 150–400)
RBC: 3.94 MIL/uL — ABNORMAL LOW (ref 4.22–5.81)
RBC: 3.4 MIL/uL — ABNORMAL LOW (ref 4.22–5.81)
RDW: 14.4 % (ref 11.5–15.5)
RDW: 14.4 % (ref 11.5–15.5)
WBC: 4.6 10*3/uL (ref 4.0–10.5)
WBC: 3.1 10*3/uL — ABNORMAL LOW (ref 4.0–10.5)
nRBC: 0 % (ref 0.0–0.2)
nRBC: 0 % (ref 0.0–0.2)

## 2021-02-08 LAB — BASIC METABOLIC PANEL
Anion gap: 13 (ref 5–15)
BUN: 46 mg/dL — ABNORMAL HIGH (ref 8–23)
CO2: 23 mmol/L (ref 22–32)
Calcium: 6.9 mg/dL — ABNORMAL LOW (ref 8.9–10.3)
Chloride: 99 mmol/L (ref 98–111)
Creatinine, Ser: 7.12 mg/dL — ABNORMAL HIGH (ref 0.61–1.24)
GFR, Estimated: 8 mL/min — ABNORMAL LOW (ref >60)
Glucose, Bld: 139 mg/dL — ABNORMAL HIGH (ref 70–99)
Potassium: 6.2 mmol/L — ABNORMAL HIGH (ref 3.5–5.1)
Sodium: 135 mmol/L (ref 135–145)

## 2021-02-08 LAB — GLUCOSE, CAPILLARY
Glucose-Capillary: 107 mg/dL — ABNORMAL HIGH (ref 70–99)
Glucose-Capillary: 231 mg/dL — ABNORMAL HIGH (ref 70–99)
Glucose-Capillary: 264 mg/dL — ABNORMAL HIGH (ref 70–99)

## 2021-02-08 MED ORDER — HEPARIN SODIUM (PORCINE) 1000 UNIT/ML IJ SOLN
INTRAMUSCULAR | Status: AC
  Filled 2021-02-08: qty 4

## 2021-02-08 MED ORDER — HEPARIN SODIUM (PORCINE) 1000 UNIT/ML DIALYSIS
4000.0000 [IU] | Freq: Once | INTRAMUSCULAR | Status: DC
  Filled 2021-02-08: qty 4

## 2021-02-08 MED ORDER — DOXERCALCIFEROL 4 MCG/2ML IV SOLN
INTRAVENOUS | Status: AC
  Filled 2021-02-08: qty 2

## 2021-02-08 MED ORDER — HEPARIN SODIUM (PORCINE) 1000 UNIT/ML DIALYSIS
20.0000 [IU]/kg | INTRAMUSCULAR | Status: DC | PRN
  Filled 2021-02-08: qty 2

## 2021-02-08 NOTE — Progress Notes (Signed)
Subjective:  Seen on HD  No cos, Hd today = nl TTS schedule  And also K 6.2 change reg diet to Carb / Renal   Objective Vital signs in last 24 hours: Vitals:   02/08/21 1300 02/08/21 1330 02/08/21 1405 02/08/21 1410  BP: (!) 97/59 (!) 97/54 (!) 93/59 108/63  Pulse:      Resp: 17 (!) '9 10 14  '$ Temp:    (!) 96.6 F (35.9 C)  TempSrc:    Axillary  SpO2:   98%   Weight:   74.3 kg   Height:       Weight change:   Physical Exam General: Alert, chronically ill appearing male in NAD Heart:RRR, no mrg Lungs  CTAB, slightly decreased in bases, nml WOB on RA Abdomen:soft, NTND Extremities:no LE edema, R BKA Dialysis Access:TDC patent on hd   OP HD: TTS NW 4h 400/ 800 68.5kg 2/ 3.5Ca TDC Hep 4000 - hect 1ug - last HD 1/25  CXR 1/25 - IS edema bilat + patchy bilat infiltrates  Problem/Plan: 1. DKA/DM 2- resolved, per admit 2. COVID 19 pna - CXR1/25w/ infiltrates,currently on RA.  Continued on isolation. S/p remdesivir. 3. Syncope -related to #1 and #2 4. ^trop/ hx CAD- no further w/u per cards 5. ESRD - TTS HD.today on schedule  And K 6.2 on Reg diet change to  Renal /carb Mod  / Treatments here are shortened due to high census/nursing shortage.  Use 10 mg midodrine pre HD.  6. PAD -hx of R BKA, bilat finger amps 7. BP/ volume -BPsoft. 6kg up after HD last hd BUT BED WT. Not that vol overloaded on exam. Cont to lower vol w/ HD as tol.   8. MBD ckd -Corrected calciumlow.phos 4.3 Using3.5Ca bath with HD, calcium carbonate started. Continue Ca based binders and increased hectorol at 76mg qHD. Check pth.  9. Anemia of ckd - Hb 12.0,no ESA indicated at this time. 10. Dispo -caregiver no longer able to handle patient and will require placement. Awaiting SNF placement.  DErnest Haber PA-C [facility] Beeper [phone]/09/2021,3:35 PM  LOS: 15 days   Labs: Basic Metabolic Panel: Recent Labs  Lab 02/02/21 0050 02/03/21 1347  02/07/21 0328 02/08/21 0201  NA 133* 134* 135 135  K 5.6* 5.1 5.3* 6.2*  CL 97* 96* 98 99  CO2 20* '25 25 23  '$ GLUCOSE 163* 287* 174* 139*  BUN 69* 53* 57* 46*  CREATININE 10.22* 9.35* 8.16* 7.12*  CALCIUM 5.7* 6.5* 7.7* 6.9*  PHOS 4.5 4.3  --   --    Liver Function Tests: Recent Labs  Lab 02/02/21 0050 02/02/21 0053 02/03/21 1347  AST 26 25  --   ALT 29 30  --   ALKPHOS 98 97  --   BILITOT 0.8 0.9  --   PROT 5.9* 6.1*  --   ALBUMIN 2.4* 2.4* 2.4*   Recent Labs  Lab 02/02/21 0050  LIPASE 29   No results for input(s): AMMONIA in the last 168 hours. CBC: Recent Labs  Lab 02/02/21 0050 02/03/21 1347 02/08/21 0201  WBC 3.7* 3.0* 4.6  HGB 11.0* 11.2* 12.0*  HCT 35.8* 36.3* 37.8*  MCV 94.7 95.8 95.9  PLT 181 190 245   Cardiac Enzymes: No results for input(s): CKTOTAL, CKMB, CKMBINDEX, TROPONINI in the last 168 hours. CBG: Recent Labs  Lab 02/07/21 1129 02/07/21 1209 02/07/21 1608 02/07/21 2009 02/08/21 0649  GLUCAP 90 98 194* 192* 107*    Studies/Results: DG Chest 1  View  Result Date: 02/07/2021 CLINICAL DATA:  Diabetic ketoacidosis EXAM: CHEST  1 VIEW COMPARISON:  01/23/2021 FINDINGS: Lungs are clear. No pneumothorax or pleural effusion. Cardiac size within normal limits. Left internal jugular hemodialysis catheter tips within the superior right atrium. Vascular stents are noted within the axilla bilaterally, likely within the axillary veins. Cervical fusion hardware is partially visualized. IMPRESSION: No active disease. Electronically Signed   By: Fidela Salisbury MD   On: 02/07/2021 16:40   Medications:  . doxercalciferol      . heparin sodium (porcine)      . vitamin C  500 mg Oral Daily  . aspirin  81 mg Oral Daily  . atorvastatin  20 mg Oral Daily  . calcium acetate  1,334 mg Oral TID WC  . calcium carbonate  1,250 mg Oral QPM  . Chlorhexidine Gluconate Cloth  6 each Topical Q0600  . cholecalciferol  1,000 Units Oral Daily  . doxercalciferol  2 mcg  Intravenous Q T,Th,Sa-HD  . gabapentin  100 mg Oral TID  . heparin injection (subcutaneous)  5,000 Units Subcutaneous Q8H  . insulin aspart  0-5 Units Subcutaneous QHS  . insulin aspart  0-9 Units Subcutaneous TID WC  . insulin aspart  3 Units Subcutaneous TID WC  . insulin glargine  6 Units Subcutaneous Daily  . melatonin  3 mg Oral Once  . midodrine  10 mg Oral TID  . pantoprazole  20 mg Oral Daily  . zinc sulfate  220 mg Oral Daily

## 2021-02-08 NOTE — TOC Progression Note (Addendum)
Transition of Care [facility]) - Progression Note    Patient Details  Name: Jose Robinson MRN: [id number] Date of Birth: 09-29-1956  Transition of Care [facility]) CM/SW Contact  Sharin Mons, RN Phone Number: 02/08/2021, 4:18 PM  Clinical Narrative:    [facility] extended ALF bed to pt and pt accepted. Bed will be available  2/11.Per admissions pt needs to be admitted to facility by 1pm. Updated COVID needed, MD and nurse made aware.  TOC team will continue to follow and assit with needs...   Expected Discharge Plan: Assisted Living ([place]) Barriers to Discharge: Other (comment) (bed will be available 2/11)  Expected Discharge Plan and Services Expected Discharge Plan: Assisted Living ([place]) In-house Referral: NA Discharge Planning Services: CM Consult Post Acute Care Choice: [place] arrangements for the past 2 months: Single Family Home                 DME Arranged: Glucometer DME Agency: NA       HH Arranged: RN,PT,OT,Nurse's Aide HH Agency: [facility] Date [place]: 01/29/21 Time [place]: [id number] Representative spoke with at [place]: Page ([place]) Interventions    Readmission Risk Interventions No flowsheet data found.

## 2021-02-08 NOTE — Progress Notes (Signed)
[facility] PROGRESS NOTE    Progress Note  Jose Robinson  [id number] DOB: [birth year] DOA: 01/23/2021 PCP: Pcp, No     Brief Narrative:   Jose Robinson is an 65 y.o. male past medical history of end-stage renal disease with dialyzes Tuesday Thursdays and Saturdays, coronary artery disease, chronic systolic heart failure COPD uncontrolled diabetes mellitus presents to the ED she was found to have a blood glucose of 500 in the ED she was found to be hypotensive and unresponsive tested positive for Covid CCM was consulted as she required pressors.  He is, off pressors and now transferred to [facility] clinically appears to be at baseline  Awaiting placement to skilled nursing facility skilled nursing facility will have a face-to-face with the patient on 02/08/2021 for possible placement on 02/09/2021  Assessment/Plan:   Hyper glycemia/DKA (diabetic ketoacidosis) ([place]): DKA has resolved, he had episode of hypoglycemia. Insulin plus sliding scale.  Incidental COVID-19 virus infection No hypoxia, no steroids required he came off precautions on 02/03/2021, has completed his 3-day course of remdesivir.  Lactic acidosis: Likely due to DKA now resolved.  Elevated troponin: Likely due to demand ischemia now resolved.  Elevated LFTs: Question due to Covid and hypotension has resolved.  End-stage renal disease on hemodialysis: Continue HD per renal.  Chronic systolic heart failure: Continue volume management per dialysis.  DVT prophylaxis: lovenxo Family Communication:none Status is: Inpatient  Remains inpatient appropriate because:Hemodynamically unstable   Dispo: The patient is from: Home              Anticipated d/c is to: SNF              Anticipated d/c date is: 1days              Patient currently is medically stable to d/c.   Difficult to place patient Yes      Code Status:     Code Status Orders  (From admission, onward)         Start     Ordered    01/24/21 0003  Full code  Continuous        01/24/21 0005        Code Status History    This patient has a current code status but no historical code status.   Advance Care Planning Activity        IV Access:    Peripheral IV   Procedures and diagnostic studies:   DG Chest 1 View  Result Date: 02/07/2021 CLINICAL DATA:  Diabetic ketoacidosis EXAM: CHEST  1 VIEW COMPARISON:  01/23/2021 FINDINGS: Lungs are clear. No pneumothorax or pleural effusion. Cardiac size within normal limits. Left internal jugular hemodialysis catheter tips within the superior right atrium. Vascular stents are noted within the axilla bilaterally, likely within the axillary veins. Cervical fusion hardware is partially visualized. IMPRESSION: No active disease. Electronically Signed   By: Fidela Salisbury MD   On: 02/07/2021 16:40     Medical Consultants:    None.   Subjective:    Jose Robinson no complaints.  Objective:    Vitals:   02/07/21 2010 02/07/21 2141 02/08/21 0400 02/08/21 0813  BP: (!) 89/55 (!) 93/31 (!) 98/46 (!) 117/35  Pulse: 79  72 72  Resp: '18  16 17  '$ Temp: 98.2 F (36.8 C)  98 F (36.7 C) 98.5 F (36.9 C)  TempSrc:   Oral Oral  SpO2: 93%  95% 100%  Weight:      Height:  SpO2: 100 % O2 Flow Rate (L/min): 2 L/min   Intake/Output Summary (Last 24 hours) at 02/08/2021 0926 Last data filed at 02/07/2021 1700 Gross per 24 hour  Intake 240 ml  Output 2726 ml  Net -2486 ml   Filed Weights   02/03/21 1330 02/07/21 0721 02/07/21 1000  Weight: 78.5 kg 77.2 kg 74.4 kg    Exam: General exam: In no acute distress. Respiratory system: Good air movement and clear to auscultation. Cardiovascular system: S1 & S2 heard, RRR. No JVD. Gastrointestinal system: Abdomen is nondistended, soft and nontender.  Extremities: No pedal edema. Skin: No rashes, lesions or ulcers Psychiatry: Judgement and insight appear normal. Mood & affect appropriate.    Data Reviewed:     Labs: Basic Metabolic Panel: Recent Labs  Lab 02/02/21 0050 02/03/21 1347 02/07/21 0328 02/08/21 0201  NA 133* 134* 135 135  K 5.6* 5.1 5.3* 6.2*  CL 97* 96* 98 99  CO2 20* '25 25 23  '$ GLUCOSE 163* 287* 174* 139*  BUN 69* 53* 57* 46*  CREATININE 10.22* 9.35* 8.16* 7.12*  CALCIUM 5.7* 6.5* 7.7* 6.9*  PHOS 4.5 4.3  --   --    GFR Estimated Creatinine Clearance: 11.3 mL/min (A) (by C-G formula based on SCr of 7.12 mg/dL (H)). Liver Function Tests: Recent Labs  Lab 02/02/21 0050 02/02/21 0053 02/03/21 1347  AST 26 25  --   ALT 29 30  --   ALKPHOS 98 97  --   BILITOT 0.8 0.9  --   PROT 5.9* 6.1*  --   ALBUMIN 2.4* 2.4* 2.4*   Recent Labs  Lab 02/02/21 0050  LIPASE 29   No results for input(s): AMMONIA in the last 168 hours. Coagulation profile No results for input(s): INR, PROTIME in the last 168 hours. COVID-19 Labs  No results for input(s): DDIMER, FERRITIN, LDH, CRP in the last 72 hours.  Lab Results  Component Value Date   SARSCOV2NAA POSITIVE (A) 01/23/2021    CBC: Recent Labs  Lab 02/02/21 0050 02/03/21 1347  WBC 3.7* 3.0*  HGB 11.0* 11.2*  HCT 35.8* 36.3*  MCV 94.7 95.8  PLT 181 190   Cardiac Enzymes: No results for input(s): CKTOTAL, CKMB, CKMBINDEX, TROPONINI in the last 168 hours. BNP (last 3 results) No results for input(s): PROBNP in the last 8760 hours. CBG: Recent Labs  Lab 02/07/21 1129 02/07/21 1209 02/07/21 1608 02/07/21 2009 02/08/21 0649  GLUCAP 90 98 194* 192* 107*   D-Dimer: No results for input(s): DDIMER in the last 72 hours. Hgb A1c: No results for input(s): HGBA1C in the last 72 hours. Lipid Profile: No results for input(s): CHOL, HDL, LDLCALC, TRIG, CHOLHDL, LDLDIRECT in the last 72 hours. Thyroid function studies: No results for input(s): TSH, T4TOTAL, T3FREE, THYROIDAB in the last 72 hours.  Invalid input(s): FREET3 Anemia work up: No results for input(s): VITAMINB12, FOLATE, FERRITIN, TIBC, IRON,  RETICCTPCT in the last 72 hours. Sepsis Labs: Recent Labs  Lab 02/02/21 0050 02/03/21 1347  WBC 3.7* 3.0*   Microbiology No results found for this or any previous visit (from the past 240 hour(s)).   Medications:   . vitamin C  500 mg Oral Daily  . aspirin  81 mg Oral Daily  . atorvastatin  20 mg Oral Daily  . calcium acetate  1,334 mg Oral TID WC  . calcium carbonate  1,250 mg Oral QPM  . Chlorhexidine Gluconate Cloth  6 each Topical Q0600  . cholecalciferol  1,000 Units Oral  Daily  . doxercalciferol  2 mcg Intravenous Q T,Th,Sa-HD  . gabapentin  100 mg Oral TID  . heparin injection (subcutaneous)  5,000 Units Subcutaneous Q8H  . insulin aspart  0-5 Units Subcutaneous QHS  . insulin aspart  0-9 Units Subcutaneous TID WC  . insulin aspart  3 Units Subcutaneous TID WC  . insulin glargine  6 Units Subcutaneous Daily  . melatonin  3 mg Oral Once  . midodrine  10 mg Oral TID  . pantoprazole  20 mg Oral Daily  . zinc sulfate  220 mg Oral Daily   Continuous Infusions:    LOS: 15 days   Charlynne Cousins  [facility]  02/08/2021, 9:26 AM

## 2021-02-08 NOTE — Progress Notes (Signed)
PT Cancellation Note  Patient Details Name: [name] MRN: [id number] DOB: [birth year]   Cancelled Treatment:    Reason Eval/Treat Not Completed: (P) Patient at procedure or test/unavailable (Pt off unit for HD.  Will f/u per POC.)   [name] [name] 02/08/2021, 12:14 PM  [name] , PTA Acute Rehabilitation Services Pager [phone] Office [phone]

## 2021-02-09 DIAGNOSIS — E1310 Other specified diabetes mellitus with ketoacidosis without coma: Secondary | ICD-10-CM

## 2021-02-09 DIAGNOSIS — R579 Shock, unspecified: Secondary | ICD-10-CM

## 2021-02-09 DIAGNOSIS — N186 End stage renal disease: Secondary | ICD-10-CM | POA: Diagnosis not present

## 2021-02-09 DIAGNOSIS — U071 COVID-19: Secondary | ICD-10-CM | POA: Diagnosis not present

## 2021-02-09 DIAGNOSIS — I959 Hypotension, unspecified: Secondary | ICD-10-CM

## 2021-02-09 DIAGNOSIS — E0810 Diabetes mellitus due to underlying condition with ketoacidosis without coma: Secondary | ICD-10-CM

## 2021-02-09 DIAGNOSIS — I509 Heart failure, unspecified: Secondary | ICD-10-CM

## 2021-02-09 LAB — BASIC METABOLIC PANEL
Anion gap: 11 (ref 5–15)
BUN: 33 mg/dL — ABNORMAL HIGH (ref 8–23)
CO2: 25 mmol/L (ref 22–32)
Calcium: 7.2 mg/dL — ABNORMAL LOW (ref 8.9–10.3)
Chloride: 99 mmol/L (ref 98–111)
Creatinine, Ser: 6.42 mg/dL — ABNORMAL HIGH (ref 0.61–1.24)
GFR, Estimated: 9 mL/min — ABNORMAL LOW (ref >60)
Glucose, Bld: 182 mg/dL — ABNORMAL HIGH (ref 70–99)
Potassium: 4.3 mmol/L (ref 3.5–5.1)
Sodium: 135 mmol/L (ref 135–145)

## 2021-02-09 LAB — GLUCOSE, CAPILLARY: Glucose-Capillary: 151 mg/dL — ABNORMAL HIGH (ref 70–99)

## 2021-02-09 MED ORDER — ATORVASTATIN CALCIUM 20 MG PO TABS: 20.0000 mg | ORAL_TABLET | Freq: Every day | ORAL | 0 refills | Status: AC

## 2021-02-09 MED ORDER — ASPIRIN 81 MG PO CHEW: 81.0000 mg | CHEWABLE_TABLET | Freq: Every day | ORAL | 3 refills | Status: AC

## 2021-02-09 MED ORDER — PANTOPRAZOLE SODIUM 20 MG PO TBEC: 20.0000 mg | DELAYED_RELEASE_TABLET | Freq: Every day | ORAL | 0 refills | Status: DC

## 2021-02-09 NOTE — Progress Notes (Signed)
Discharge summary packet with pertinent documents provided to [facility] staff. Pt D/C to [facility] as ordered. Pt remains alert/oriented in no apparent distress. NO complaints voiced.

## 2021-02-09 NOTE — Discharge Summary (Signed)
Physician Discharge Summary  Jose Robinson VOZ:[id number] DOB: 02-14-1956 DOA: 01/23/2021  PCP: Pcp, No  Admit date: 01/23/2021 Discharge date: 02/09/2021  Admitted From: Home Disposition:  Home  Recommendations for Outpatient Follow-up:  1. Follow up with PCP in 1-2 weeks 2. Please obtain BMP/CBC in one week   Home Health:No Equipment/Devices:none  Discharge Condition:stable CODE STATUS:Full Diet recommendation: Heart Healthy  Brief/Interim Summary: 65 y.o. male past medical history of end-stage renal disease with dialyzes Tuesday Thursdays and Saturdays, coronary artery disease, chronic systolic heart failure COPD uncontrolled diabetes mellitus presents to the ED she was found to have a blood glucose of 500 in the ED she was found to be hypotensive and unresponsive tested positive for Covid CCM was consulted as she required pressors. He is, off pressors and now transferred to [facility] clinically appears to be at baseline  Discharge Diagnoses:  Principal Problem:   DKA (diabetic ketoacidosis) ([place]) Active Problems:   COVID-19 virus infection   Syncope   ESRD (end stage renal disease) ([place])   Congestive heart failure (CHF) ([place])   Hypotension   Shock ([place]) Hyperglycemia/DKA: He was started on IV insulin once his gap closed and his bicarbonate became greater than 20 he was transitioned to long-acting insulin plus sliding scale which is at fairly well-controlled blood glucose after this in house. Had an episode of hypoglycemia in house which was corrected with insulin adjustment.  Incidental COVID-19 viral infection: Also hypoxia no steroids he was treated empirically with a 3-day course of prophylactic remdesivir. He has completed his isolation on 02/03/2021.  Syncope: Likely due to hypotension in the setting of DKA now resolved.  Lactic acidosis: Likely due to K not resolved.  Elevated troponins/CAD: Likely demand ischemia now resolved. Continue aspirin as an  outpatient.  Elevated LFTs: Likely due to hypotension now resolved.  End-stage renal disease, Renal was consulted and he was continued on his regular dialysis treatment.  Chronic systolic heart failure: Fluid management was per renal he remained euvolemic.  Greater than   Discharge Instructions  Discharge Instructions    Diet - low sodium heart healthy   Complete by: As directed    Increase activity slowly   Complete by: As directed      Allergies as of 02/09/2021      Reactions   Acetaminophen Palpitations   Prednisone Other (See Comments)   Severe hallucinations/paranoid delusions after steroid premedications for contrast allergy, required IV haldol and restraints.   Ivp Dye [iodinated Diagnostic Agents] Other (See Comments)      Medication List    STOP taking these medications   multivitamin Tabs tablet     TAKE these medications   aspirin 81 MG chewable tablet Chew 1 tablet (81 mg total) by mouth daily. Start taking on: February 10, 2021   atorvastatin 20 MG tablet Commonly known as: LIPITOR Take 1 tablet (20 mg total) by mouth daily. Start taking on: February 10, 2021   blood glucose meter kit and supplies Kit Dispense based on patient and insurance preference. Use up to four times daily as directed. (FOR ICD-9 250.00, 250.01).   calcium acetate 667 MG capsule Commonly known as: PHOSLO Take 1,334 mg by mouth 3 (three) times daily with meals.   donepezil 10 MG tablet Commonly known as: ARICEPT Take 10 mg by mouth daily.   gabapentin 100 MG capsule Commonly known as: NEURONTIN Take 100 mg by mouth 3 (three) times daily.   glucagon 1 MG injection Inject 1 mg into the muscle  daily as needed (low blood sugar).   midodrine 5 MG tablet Commonly known as: PROAMATINE Take 5 mg by mouth See admin instructions. Take one tablet (5 mg) by mouth Tuesday, Thursday, Saturday before dialysis   naproxen sodium 220 MG tablet Commonly known as: ALEVE Take 440 mg  by mouth 2 (two) times daily as needed (pain/headache).   NovoLOG FlexPen 100 UNIT/ML FlexPen Generic drug: insulin aspart Inject 10 Units into the skin 3 (three) times daily as needed for high blood sugar.   nystatin ointment Commonly known as: MYCOSTATIN Apply 1 application topically daily.   omeprazole 20 MG capsule Commonly known as: PRILOSEC Take 20 mg by mouth 2 (two) times daily before a meal.   ondansetron 4 MG tablet Commonly known as: ZOFRAN Take 4 mg by mouth every 8 (eight) hours as needed for nausea or vomiting.   pantoprazole 20 MG tablet Commonly known as: PROTONIX Take 1 tablet (20 mg total) by mouth daily. Start taking on: February 10, 2021   rosuvastatin 10 MG tablet Commonly known as: CRESTOR Take 10 mg by mouth at bedtime.   Semglee 100 UNIT/ML Solostar Pen Generic drug: insulin glargine Inject into the skin daily.   sevelamer carbonate 800 MG tablet Commonly known as: RENVELA Take 800 mg by mouth 3 (three) times daily with meals.   Trulicity 1.5 DE/0.8XK Sopn Generic drug: Dulaglutide Inject 1.5 mg into the skin every Thursday.   Zenpep 5000-24000 units Cpep Generic drug: Pancrelipase (Lip-Prot-Amyl) Take 1 capsule by mouth 3 (three) times daily with meals.       Allergies  Allergen Reactions  . Acetaminophen Palpitations  . Prednisone Other (See Comments)    Severe hallucinations/paranoid delusions after steroid premedications for contrast allergy, required IV haldol and restraints.   [name] [Iodinated Diagnostic Agents] Other (See Comments)    Consultations:  Nephrology   Procedures/Studies: DG Chest 1 View  Result Date: 02/07/2021 CLINICAL DATA:  Diabetic ketoacidosis EXAM: CHEST  1 VIEW COMPARISON:  01/23/2021 FINDINGS: Lungs are clear. No pneumothorax or pleural effusion. Cardiac size within normal limits. Left internal jugular hemodialysis catheter tips within the superior right atrium. Vascular stents are noted within the  axilla bilaterally, likely within the axillary veins. Cervical fusion hardware is partially visualized. IMPRESSION: No active disease. Electronically Signed   By: Fidela Salisbury MD   On: 02/07/2021 16:40   DG Chest 2 View  Result Date: 01/23/2021 CLINICAL DATA:  Weakness, short of breath, diabetes, tobacco abuse EXAM: CHEST - 2 VIEW COMPARISON:  None. FINDINGS: Frontal and lateral views of the chest demonstrate unremarkable cardiac silhouette. Left internal jugular dialysis catheter tip at the atriocaval junction. Stents are seen within the bilateral axillary regions. Diffuse interstitial and ground-glass opacities are identified. Findings are greatest at the lung bases. No effusion or pneumothorax. No acute bony abnormalities. IMPRESSION: 1. Diffuse interstitial and ground-glass opacities, favor edema over infection. Electronically Signed   By: Randa Ngo M.D.   On: 01/23/2021 15:08   DG Abd 2 Views  Result Date: 02/01/2021 CLINICAL DATA:  Abdominal pain.  Nausea.  COVID positive. EXAM: ABDOMEN - 2 VIEW COMPARISON:  No prior. FINDINGS: Soft tissue structures are unremarkable. Nonspecific air-filled loops of small bowel noted. No bowel distention. Stool noted throughout the colon. No free air. Vascular sheath noted with tip over the cavoatrial junction. Prior lumbar spine fusion. Mild lumbar scoliosis concave right. Degenerative changes lumbar spine and both hips. IMPRESSION: No acute intra-abdominal abnormality identified. No bowel distention. Stool noted  throughout the colon. Electronically Signed   By: [name]  Register   On: 02/01/2021 08:33   ECHOCARDIOGRAM LIMITED  Result Date: 01/24/2021    ECHOCARDIOGRAM LIMITED REPORT   Patient Name:   AMATO SEVILLANO Hewett Date of Exam: 01/24/2021 Medical Rec #:  [id number]      Height: Accession #:    [id number]     Weight:       190.0 lb Date of Birth:  [birth year]     BSA:          1.951 m Patient Age:    67 years       BP:           130/57 mmHg Patient Gender: M               HR:           93 bpm. Exam Location:  Inpatient Procedure: Limited Echo, Cardiac Doppler and Color Doppler STAT ECHO Indications:    Abnormal ECG R94.31  History:        Patient has no prior history of Echocardiogram examinations.                 Risk Factors:Hypertension, Diabetes and Current Smoker.  Sonographer:    Vickie Epley RDCS Referring Phys: [id number] [facility]  Sonographer Comments: Covid positive. IMPRESSIONS  1. Abnormal septal motion and posterior lateral hypokinesis . Left ventricular ejection fraction, by estimation, is 45 to 50%. The left ventricle has mildly decreased function. The left ventricle demonstrates regional wall motion abnormalities (see scoring diagram/findings for description).  2. Right ventricular systolic function is normal. The right ventricular size is normal.  3. The mitral valve is normal in structure. No evidence of mitral valve regurgitation. No evidence of mitral stenosis.  4. The aortic valve is normal in structure. Aortic valve regurgitation is not visualized. No aortic stenosis is present.  5. The inferior vena cava is normal in size with greater than 50% respiratory variability, suggesting right atrial pressure of 3 mmHg. FINDINGS  Left Ventricle: Abnormal septal motion and posterior lateral hypokinesis. Left ventricular ejection fraction, by estimation, is 45 to 50%. The left ventricle has mildly decreased function. The left ventricle demonstrates regional wall motion abnormalities. The left ventricular internal cavity size was normal in size. There is no left ventricular hypertrophy. Right Ventricle: The right ventricular size is normal. No increase in right ventricular wall thickness. Right ventricular systolic function is normal. Left Atrium: Left atrial size was normal in size. Right Atrium: Right atrial size was normal in size. Pericardium: There is no evidence of pericardial effusion. Mitral Valve: The mitral valve is normal in structure. No evidence  of mitral valve stenosis. Tricuspid Valve: The tricuspid valve is normal in structure. Tricuspid valve regurgitation is not demonstrated. No evidence of tricuspid stenosis. Aortic Valve: The aortic valve is normal in structure. Aortic valve regurgitation is not visualized. No aortic stenosis is present. Pulmonic Valve: The pulmonic valve was normal in structure. Pulmonic valve regurgitation is not visualized. No evidence of pulmonic stenosis. Aorta: The aortic root is normal in size and structure. Venous: The inferior vena cava is normal in size with greater than 50% respiratory variability, suggesting right atrial pressure of 3 mmHg. IAS/Shunts: The interatrial septum was not well visualized. LEFT VENTRICLE PLAX 2D LVIDd:         3.90 cm      Diastology LVIDs:         3.30 cm      LV e'  medial:    4.68 cm/s LV PW:         0.80 cm      LV E/e' medial:  13.5 LV IVS:        0.80 cm      LV e' lateral:   5.44 cm/s LVOT diam:     2.20 cm      LV E/e' lateral: 11.6 LV SV:         47 LV SV Index:   24 LVOT Area:     3.80 cm  LV Volumes (MOD) LV vol d, MOD A2C: 122.0 ml LV vol d, MOD A4C: 100.0 ml LV vol s, MOD A2C: 76.2 ml LV vol s, MOD A4C: 64.8 ml LV SV MOD A2C:     45.8 ml LV SV MOD A4C:     100.0 ml LV SV MOD BP:      39.6 ml RIGHT VENTRICLE RV S prime:     8.05 cm/s TAPSE (M-mode): 1.4 cm LEFT ATRIUM         Index LA diam:    2.40 cm 1.23 cm/m  AORTIC VALVE LVOT Vmax:   77.00 cm/s LVOT Vmean:  49.200 cm/s LVOT VTI:    0.123 m  AORTA Ao Root diam: 3.30 cm MITRAL VALVE MV Area (PHT): 3.65 cm    SHUNTS MV Decel Time: 208 msec    Systemic VTI:  0.12 m MV E velocity: 63.00 cm/s  Systemic Diam: 2.20 cm MV A velocity: 84.80 cm/s MV E/A ratio:  0.74 Jenkins Rouge MD Electronically signed by Jenkins Rouge MD Signature Date/Time: 01/24/2021/8:04:00 AM    Final     (Echo, Carotid, EGD, Colonoscopy, ERCP)    Subjective:   Discharge Exam: Vitals:   02/08/21 1947 02/09/21 0806  BP: (!) 91/58 97/70  Pulse: 80 76   Resp: 17 17  Temp: 98 F (36.7 C) 98.7 F (37.1 C)  SpO2: 96% 95%   Vitals:   02/08/21 1405 02/08/21 1410 02/08/21 1947 02/09/21 0806  BP: (!) 93/59 108/63 (!) 91/58 97/70  Pulse:   80 76  Resp: _0 Temp:  (!) 96.6 F (35.9 C) 98 F (36.7 C) 98.7 F (37.1 C)  TempSrc:  Axillary  Oral  SpO2: 98%  96% 95%  Weight: 74.3 kg     Height:        General: Pt is alert, awake, not in acute distress Cardiovascular: RRR, S1/S2 +, no rubs, no gallops Respiratory: CTA bilaterally, no wheezing, no rhonchi Abdominal: Soft, NT, ND, bowel sounds + Extremities: no edema, no cyanosis    The results of significant diagnostics from this hospitalization (including imaging, microbiology, ancillary and laboratory) are listed below for reference.     Microbiology: No results found for this or any previous visit (from the past 240 hour(s)).   Labs: BNP (last 3 results) No results for input(s): BNP in the last 8760 hours. Basic Metabolic Panel: Recent Labs  Lab 02/03/21 1347 02/07/21 0328 02/08/21 0201 02/09/21 0143  NA 134* 135 135 135  K 5.1 5.3* 6.2* 4.3  CL 96* 98 99 99  CO2 _1 GLUCOSE 287* 174* 139* 182*  BUN 53* 57* 46* 33*  CREATININE 9.35* 8.16* 7.12* 6.42*  CALCIUM 6.5* 7.7* 6.9* 7.2*  PHOS 4.3  --   --   --    Liver Function Tests: Recent Labs  Lab 02/03/21 1347  ALBUMIN 2.4*   No results for input(s): LIPASE, AMYLASE  in the last 168 hours. No results for input(s): AMMONIA in the last 168 hours. CBC: Recent Labs  Lab 02/03/21 1347 02/08/21 0201 02/08/21 1625  WBC 3.0* 4.6 3.1*  HGB 11.2* 12.0* 10.0*  HCT 36.3* 37.8* 33.8*  MCV 95.8 95.9 99.4  PLT 190 245 204   Cardiac Enzymes: No results for input(s): CKTOTAL, CKMB, CKMBINDEX, TROPONINI in the last 168 hours. BNP: Invalid input(s): POCBNP CBG: Recent Labs  Lab 02/07/21 2009 02/08/21 0649 02/08/21 1631 02/08/21 1946 02/09/21 0645  GLUCAP 192* 107* 231* 264* 151*   D-Dimer No  results for input(s): DDIMER in the last 72 hours. Hgb A1c No results for input(s): HGBA1C in the last 72 hours. Lipid Profile No results for input(s): CHOL, HDL, LDLCALC, TRIG, CHOLHDL, LDLDIRECT in the last 72 hours. Thyroid function studies No results for input(s): TSH, T4TOTAL, T3FREE, THYROIDAB in the last 72 hours.  Invalid input(s): FREET3 Anemia work up No results for input(s): VITAMINB12, FOLATE, FERRITIN, TIBC, IRON, RETICCTPCT in the last 72 hours. Urinalysis No results found for: COLORURINE, APPEARANCEUR, LABSPEC, [place], GLUCOSEU, HGBUR, BILIRUBINUR, KETONESUR, PROTEINUR, UROBILINOGEN, NITRITE, LEUKOCYTESUR Sepsis Labs Invalid input(s): PROCALCITONIN,  WBC,  LACTICIDVEN Microbiology No results found for this or any previous visit (from the past 240 hour(s)).   Time coordinating discharge: Over 30 minutes  SIGNED:   Charlynne Cousins, MD  [facility] 02/09/2021, 9:47 AM Pager   If 7PM-7AM, please contact night-coverage [email or web address] Password [email or web address]

## 2021-02-09 NOTE — Progress Notes (Signed)
[facility] Progress Note   Subjective:   Seen in room. Reports feeling well. Says he feels better after volume removed with HD but still feels he has some excess volume. Denies SOB/orthopnea/dizziness. Planned to discharge today to ALF  Objective Vitals:   02/08/21 1405 02/08/21 1410 02/08/21 1947 02/09/21 0806  BP: (!) 93/59 108/63 (!) 91/58 97/70  Pulse:   80 76  Resp: '10 14 17 17  '$ Temp:  (!) 96.6 F (35.9 C) 98 F (36.7 C) 98.7 F (37.1 C)  TempSrc:  Axillary  Oral  SpO2: 98%  96% 95%  Weight: 74.3 kg     Height:       Physical Exam General: Well developed, alert male in NAD Heart: RRR, no murmurs, rubs or gallops Lungs: CTA bilaterally, respirations unlabored on RA Abdomen: Soft, non-tender, non-distended, +BS Extremities: No edema b/l lower extremities. R BKA Dialysis Access:  [facility]  Additional Objective Labs: Basic Metabolic Panel: Recent Labs  Lab 02/03/21 1347 02/07/21 0328 02/08/21 0201 02/09/21 0143  NA 134* 135 135 135  K 5.1 5.3* 6.2* 4.3  CL 96* 98 99 99  CO2 '25 25 23 25  '$ GLUCOSE 287* 174* 139* 182*  BUN 53* 57* 46* 33*  CREATININE 9.35* 8.16* 7.12* 6.42*  CALCIUM 6.5* 7.7* 6.9* 7.2*  PHOS 4.3  --   --   --    Liver Function Tests: Recent Labs  Lab 02/03/21 1347  ALBUMIN 2.4*   CBC: Recent Labs  Lab 02/03/21 1347 02/08/21 0201 02/08/21 1625  WBC 3.0* 4.6 3.1*  HGB 11.2* 12.0* 10.0*  HCT 36.3* 37.8* 33.8*  MCV 95.8 95.9 99.4  PLT 190 245 204   Blood Culture    Component Value Date/Time   SDES BLOOD RIGHT FOREARM 01/24/2021 2335   SPECREQUEST  01/24/2021 2335    AEROBIC BOTTLE ONLY Blood Culture results may not be optimal due to an inadequate volume of blood received in culture bottles   CULT  01/24/2021 2335    NO GROWTH 5 DAYS Performed at [facility], [place] [address] [place], [place] [address]    REPTSTATUS 01/30/2021 FINAL 01/24/2021 2335   CBG: Recent Labs  Lab 02/07/21 2009 02/08/21 0649  02/08/21 1631 02/08/21 1946 02/09/21 0645  GLUCAP 192* 107* 231* 264* 151*    Studies/Results: DG Chest 1 View  Result Date: 02/07/2021 CLINICAL DATA:  Diabetic ketoacidosis EXAM: CHEST  1 VIEW COMPARISON:  01/23/2021 FINDINGS: Lungs are clear. No pneumothorax or pleural effusion. Cardiac size within normal limits. Left internal jugular hemodialysis catheter tips within the superior right atrium. Vascular stents are noted within the axilla bilaterally, likely within the axillary veins. Cervical fusion hardware is partially visualized. IMPRESSION: No active disease. Electronically Signed   By: Fidela Salisbury MD   On: 02/07/2021 16:40   Medications:  . vitamin C  500 mg Oral Daily  . aspirin  81 mg Oral Daily  . atorvastatin  20 mg Oral Daily  . calcium acetate  1,334 mg Oral TID WC  . calcium carbonate  1,250 mg Oral QPM  . Chlorhexidine Gluconate Cloth  6 each Topical Q0600  . cholecalciferol  1,000 Units Oral Daily  . doxercalciferol  2 mcg Intravenous Q T,Th,Sa-HD  . gabapentin  100 mg Oral TID  . heparin  4,000 Units Dialysis Once in dialysis  . heparin injection (subcutaneous)  5,000 Units Subcutaneous Q8H  . insulin aspart  0-5 Units Subcutaneous QHS  . insulin aspart  0-9 Units  Subcutaneous TID WC  . insulin aspart  3 Units Subcutaneous TID WC  . insulin glargine  6 Units Subcutaneous Daily  . melatonin  3 mg Oral Once  . midodrine  10 mg Oral TID  . pantoprazole  20 mg Oral Daily  . zinc sulfate  220 mg Oral Daily    Dialysis Orders: TTS NW 4h 400/ 800 68.5kg 2/ 3.5Ca TDC Hep 4000 - hect 1ug - last HD 1/25  CXR 1/25 - IS edema bilat + patchy bilat infiltrates  Assessment/Plan:  1. DKA/DM 2- DKA resolved. Insulin per primary 2. COVID 19 pna - CXR1/25w/ infiltrates,currently on RA. Now off isolation precautions. S/p remdesivir. 3. Syncope -related to #1 and #2, resolved 4. ^trop/ hx CAD- no further w/u per cards 5. ESRD - Continue TTS  schedule, can return to his regular dialysis seat tomorrow. Continue renal diet (K+ was 6.2 when he was changed to a regular diet).  6. PAD -hx ofR BKA, bilat finger amps 7. BP/ volume -BPsoft, continue midodrine.Weights variable here. Patient still feels he has some excess fluid. Titrate weights down with outpatient HD 8. MBD ckd -Calcium running low. On hectorol, follow PTH outpatient. Continue 3.5Ca bath and calcium based binder.  9. Anemia of ckd - Hb 10.0,no ESA indicated at this time. 10. Dispo -caregiver no longer able to handle patient's care. Reports he is going to ALF today.    Anice Paganini, PA-C 02/09/2021, 10:08 AM  [facility] Pager: [phone]

## 2021-02-09 NOTE — Progress Notes (Signed)
[facility] PROGRESS NOTE    Progress Note  [name]  [id number] DOB: [birth year] DOA: 01/23/2021 PCP: Pcp, No     Brief Narrative:   Jose Robinson is an 65 y.o. male past medical history of end-stage renal disease with dialyzes Tuesday Thursdays and Saturdays, coronary artery disease, chronic systolic heart failure COPD uncontrolled diabetes mellitus presents to the ED she was found to have a blood glucose of 500 in the ED she was found to be hypotensive and unresponsive tested positive for Covid CCM was consulted as she required pressors.  He is, off pressors and now transferred to [facility] clinically appears to be at baseline  Awaiting placement to skilled nursing facility skilled nursing facility will have a face-to-face with the patient on 02/08/2021 for possible placement on 02/09/2021  Assessment/Plan:   Hyper glycemia/DKA (diabetic ketoacidosis) ([place]): DKA has resolved, he had episode of hypoglycemia. Blood glucose fairly well controlled continue long-acting insulin per sliding scale.  Incidental COVID-19 virus infection No hypoxia, no steroids required he came off precautions on 02/03/2021, has completed his 3-day course of prophylactic remdesivir.  Lactic acidosis: Likely due to DKA now resolved.  Elevated troponin: Likely due to demand ischemia now resolved.  Elevated LFTs: Question due to Covid and hypotension has resolved.  End-stage renal disease on hemodialysis: Continue HD per renal.  Chronic systolic heart failure: Continue volume management per dialysis.  DVT prophylaxis: lovenxo Family Communication:none Status is: Inpatient  Remains inpatient appropriate because:Hemodynamically unstable   Dispo: The patient is from: Home              Anticipated d/c is to: SNF              Anticipated d/c date is: 1days              Patient currently is medically stable to d/c.   Difficult to place patient Yes      Code Status:     Code Status  Orders  (From admission, onward)         Start     Ordered   01/24/21 0003  Full code  Continuous        01/24/21 0005        Code Status History    This patient has a current code status but no historical code status.   Advance Care Planning Activity        IV Access:    Peripheral IV   Procedures and diagnostic studies:   DG Chest 1 View  Result Date: 02/07/2021 CLINICAL DATA:  Diabetic ketoacidosis EXAM: CHEST  1 VIEW COMPARISON:  01/23/2021 FINDINGS: Lungs are clear. No pneumothorax or pleural effusion. Cardiac size within normal limits. Left internal jugular hemodialysis catheter tips within the superior right atrium. Vascular stents are noted within the axilla bilaterally, likely within the axillary veins. Cervical fusion hardware is partially visualized. IMPRESSION: No active disease. Electronically Signed   By: Fidela Salisbury MD   On: 02/07/2021 16:40     Medical Consultants:    None.   Subjective:    Jose Robinson no new complaints today.  Objective:    Vitals:   02/08/21 1405 02/08/21 1410 02/08/21 1947 02/09/21 0806  BP: (!) 93/59 108/63 (!) 91/58 97/70  Pulse:   80 76  Resp: '10 14 17 17  '$ Temp:  (!) 96.6 F (35.9 C) 98 F (36.7 C) 98.7 F (37.1 C)  TempSrc:  Axillary  Oral  SpO2: 98%  96% 95%  Weight: 74.3 kg     Height:       SpO2: 95 % O2 Flow Rate (L/min): 2 L/min   Intake/Output Summary (Last 24 hours) at 02/09/2021 0910 Last data filed at 02/08/2021 1700 Gross per 24 hour  Intake 240 ml  Output 3500 ml  Net -3260 ml   Filed Weights   02/07/21 1000 02/08/21 1130 02/08/21 1405  Weight: 74.4 kg 77.8 kg 74.3 kg    Exam: General exam: In no acute distress. Respiratory system: Good air movement and clear to auscultation. Cardiovascular system: S1 & S2 heard, RRR. No JVD. Gastrointestinal system: Abdomen is nondistended, soft and nontender.  Extremities: No pedal edema. Skin: No rashes, lesions or ulcers Psychiatry: Judgement  and insight appear normal. Mood & affect appropriate.  Data Reviewed:    Labs: Basic Metabolic Panel: Recent Labs  Lab 02/03/21 1347 02/07/21 0328 02/08/21 0201 02/09/21 0143  NA 134* 135 135 135  K 5.1 5.3* 6.2* 4.3  CL 96* 98 99 99  CO2 '25 25 23 25  '$ GLUCOSE 287* 174* 139* 182*  BUN 53* 57* 46* 33*  CREATININE 9.35* 8.16* 7.12* 6.42*  CALCIUM 6.5* 7.7* 6.9* 7.2*  PHOS 4.3  --   --   --    GFR Estimated Creatinine Clearance: 12.5 mL/min (A) (by C-G formula based on SCr of 6.42 mg/dL (H)). Liver Function Tests: Recent Labs  Lab 02/03/21 1347  ALBUMIN 2.4*   No results for input(s): LIPASE, AMYLASE in the last 168 hours. No results for input(s): AMMONIA in the last 168 hours. Coagulation profile No results for input(s): INR, PROTIME in the last 168 hours. COVID-19 Labs  No results for input(s): DDIMER, FERRITIN, LDH, CRP in the last 72 hours.  Lab Results  Component Value Date   SARSCOV2NAA POSITIVE (A) 01/23/2021    CBC: Recent Labs  Lab 02/03/21 1347 02/08/21 0201 02/08/21 1625  WBC 3.0* 4.6 3.1*  HGB 11.2* 12.0* 10.0*  HCT 36.3* 37.8* 33.8*  MCV 95.8 95.9 99.4  PLT 190 245 204   Cardiac Enzymes: No results for input(s): CKTOTAL, CKMB, CKMBINDEX, TROPONINI in the last 168 hours. BNP (last 3 results) No results for input(s): PROBNP in the last 8760 hours. CBG: Recent Labs  Lab 02/07/21 2009 02/08/21 0649 02/08/21 1631 02/08/21 1946 02/09/21 0645  GLUCAP 192* 107* 231* 264* 151*   D-Dimer: No results for input(s): DDIMER in the last 72 hours. Hgb A1c: No results for input(s): HGBA1C in the last 72 hours. Lipid Profile: No results for input(s): CHOL, HDL, LDLCALC, TRIG, CHOLHDL, LDLDIRECT in the last 72 hours. Thyroid function studies: No results for input(s): TSH, T4TOTAL, T3FREE, THYROIDAB in the last 72 hours.  Invalid input(s): FREET3 Anemia work up: No results for input(s): VITAMINB12, FOLATE, FERRITIN, TIBC, IRON, RETICCTPCT in the  last 72 hours. Sepsis Labs: Recent Labs  Lab 02/03/21 1347 02/08/21 0201 02/08/21 1625  WBC 3.0* 4.6 3.1*   Microbiology No results found for this or any previous visit (from the past 240 hour(s)).   Medications:   . vitamin C  500 mg Oral Daily  . aspirin  81 mg Oral Daily  . atorvastatin  20 mg Oral Daily  . calcium acetate  1,334 mg Oral TID WC  . calcium carbonate  1,250 mg Oral QPM  . Chlorhexidine Gluconate Cloth  6 each Topical Q0600  . cholecalciferol  1,000 Units Oral Daily  . doxercalciferol  2 mcg Intravenous Q T,Th,Sa-HD  . gabapentin  100 mg Oral  TID  . heparin  4,000 Units Dialysis Once in dialysis  . heparin injection (subcutaneous)  5,000 Units Subcutaneous Q8H  . insulin aspart  0-5 Units Subcutaneous QHS  . insulin aspart  0-9 Units Subcutaneous TID WC  . insulin aspart  3 Units Subcutaneous TID WC  . insulin glargine  6 Units Subcutaneous Daily  . melatonin  3 mg Oral Once  . midodrine  10 mg Oral TID  . pantoprazole  20 mg Oral Daily  . zinc sulfate  220 mg Oral Daily   Continuous Infusions:    LOS: 16 days   Charlynne Cousins  [facility]  02/09/2021, 9:10 AM

## 2021-02-09 NOTE — Progress Notes (Signed)
Physical Therapy Treatment Patient Details Name: Jose Robinson MRN: [id number] DOB: October 09, 1956 Today's Date: 02/09/2021    History of Present Illness 65yo with hx ESRD on TTS HD, CAD, PAD, chronic systolic chf, copd, DM, R BKA who presented with generalized weakness, found to have glucose just under 500. While in ED, became unresponsive and hypotensive, requiring IVF resuscitation. Tested covid pos.    PT Comments    Pt supine in bed on arrival this session.  Pt continues to benefit from skilled rehab in a home setting and will require a [place] aide as he required significant assistance to donn prosthesis and perform ADLs.  Lacks dexterity in hands to perform these tasks.  Plan to d/c today and excited to leave the hospital.     Follow Up Recommendations  Home health PT;Supervision for mobility/OOB     Equipment Recommendations  None recommended by PT    Recommendations for Other Services       Precautions / Restrictions Precautions Precautions: Fall Required Braces or Orthoses: Other Brace Other Brace: R prosthesis ( total assistance to donn limb) Restrictions Weight Bearing Restrictions: No    Mobility  Bed Mobility Overal bed mobility: Needs Assistance Bed Mobility: Rolling;Sidelying to Sit Rolling: Min assist Sidelying to sit: Mod assist       General bed mobility comments: Min to mod assistance to move into sitting.  Presents with posterior pelvic tilt and lean.  Once LE donned able to better maintain sitting balance.    Transfers Overall transfer level: Needs assistance Equipment used: Rolling walker (2 wheeled)   Sit to Stand: From elevated surface;Min guard         General transfer comment: Cues for hand placement to and from seated surface this session.  Pt continues to present with poor eccentric load to seated surface despite cues.  Ambulation/Gait Ambulation/Gait assistance: Min guard Gait Distance (Feet): 120 Feet Assistive device: Rolling walker (2  wheeled) Gait Pattern/deviations: Step-through pattern;Decreased stride length;Trunk flexed;Wide base of support;Shuffle     General Gait Details: Pt required cues fior R foot clearance, RW position and RW proximity.  Pt continues to require standing rest break to reset feet at times.   Stairs             Wheelchair Mobility    Modified Rankin (Stroke Patients Only)       Balance Overall balance assessment: Needs assistance Sitting-balance support: Single extremity supported;Feet supported Sitting balance-Leahy Scale: Fair Sitting balance - Comments: EOB without UE support     Standing balance-Leahy Scale: Poor Standing balance comment: RW for standing and gait                            Cognition Arousal/Alertness: Awake/alert Behavior During Therapy: WFL for tasks assessed/performed Overall Cognitive Status: Within Functional Limits for tasks assessed                                        Exercises      General Comments        Pertinent Vitals/Pain Pain Assessment: No/denies pain    Home Living                      Prior Function            PT Goals (current goals can now be found in the  care plan section) Acute Rehab PT Goals Patient Stated Goal: return home Potential to Achieve Goals: Good Progress towards PT goals: Progressing toward goals    Frequency    Min 3X/week      PT Plan Current plan remains appropriate    Co-evaluation              AM-PAC PT "6 Clicks" Mobility   Outcome Measure  Help needed turning from your back to your side while in a flat bed without using bedrails?: A Little Help needed moving from lying on your back to sitting on the side of a flat bed without using bedrails?: A Lot Help needed moving to and from a bed to a chair (including a wheelchair)?: A Little Help needed standing up from a chair using your arms (e.g., wheelchair or bedside chair)?: A Little Help  needed to walk in hospital room?: A Little Help needed climbing 3-5 steps with a railing? : A Lot 6 Click Score: 16    End of Session Equipment Utilized During Treatment: Gait belt Activity Tolerance: Patient tolerated treatment well Patient left: in chair;with call bell/phone within reach Nurse Communication: Mobility status PT Visit Diagnosis: Unsteadiness on feet (R26.81);Muscle weakness (generalized) (M62.81);Difficulty in walking, not elsewhere classified (R26.2)     Time: [id number] PT Time Calculation (min) (ACUTE ONLY): 28 min  Charges:  $Gait Training: 8-22 mins $Therapeutic Activity: 8-22 mins                     Jose Robinson , PTA Acute Rehabilitation Services Pager [phone] Office [phone]     Jose Robinson [name] 02/09/2021, 1:37 PM

## 2021-02-09 NOTE — Progress Notes (Signed)
Report given to Haynes Bast [employer].all questions and concerns were fully answered.

## 2021-02-09 NOTE — TOC Transition Note (Signed)
Transition of Care [facility]) - CM/SW Discharge Note   Patient Details  Name: RHIAN YEARTA MRN: [id number] Date of Birth: [birth year]  Transition of Care [facility]) CM/SW Contact:  Coralee Pesa, [place] Phone Number: 02/09/2021, 10:53 AM   Clinical Narrative:    Nurse to call report to [phone].   Final next level of care: Assisted Living Barriers to Discharge: Barriers Resolved   Patient Goals and CMS Choice Patient states their goals for this hospitalization and ongoing recovery are:: return home to previous living arrangements CMS Medicare.gov Compare Post Acute Care list provided to:: Patient Choice offered to / list presented to : Patient  Discharge Placement              Patient chooses bed at:  [facility]) Patient to be transferred to facility by: SAFE transport Name of family member notified: [place] Patient and family notified of of transfer: 02/09/21  Discharge Plan and Services In-house Referral: NA Discharge Planning Services: CM Consult Post Acute Care Choice: Home Health,Durable Medical Equipment          DME Arranged: Glucometer DME Agency: NA       HH Arranged: RN,PT,OT,Nurse's Aide HH Agency: [facility] Date [place]: 01/29/21 Time [place]: [place] Representative spoke with at [place]: [place] ([place]) Interventions     Readmission Risk Interventions No flowsheet data found.

## 2021-04-15 ENCOUNTER — Other Ambulatory Visit: Payer: Self-pay

## 2021-04-15 ENCOUNTER — Emergency Department (HOSPITAL_COMMUNITY): Payer: Medicare (Managed Care)

## 2021-04-15 ENCOUNTER — Observation Stay (HOSPITAL_COMMUNITY): Payer: Medicare (Managed Care)

## 2021-04-15 ENCOUNTER — Encounter (HOSPITAL_COMMUNITY): Payer: Self-pay

## 2021-04-15 ENCOUNTER — Observation Stay (HOSPITAL_COMMUNITY)
Admission: EM | Admit: 2021-04-15 | Discharge: 2021-04-18 | Disposition: A | Discharge status: Skilled Nursing Facility | Payer: Medicare (Managed Care) | Attending: Internal Medicine | Admitting: Internal Medicine

## 2021-04-15 DIAGNOSIS — E11649 Type 2 diabetes mellitus with hypoglycemia without coma: Secondary | ICD-10-CM | POA: Diagnosis not present

## 2021-04-15 DIAGNOSIS — I5022 Chronic systolic (congestive) heart failure: Secondary | ICD-10-CM | POA: Diagnosis present

## 2021-04-15 DIAGNOSIS — E1121 Type 2 diabetes mellitus with diabetic nephropathy: Secondary | ICD-10-CM | POA: Diagnosis present

## 2021-04-15 DIAGNOSIS — Z794 Long term (current) use of insulin: Secondary | ICD-10-CM | POA: Insufficient documentation

## 2021-04-15 DIAGNOSIS — N186 End stage renal disease: Secondary | ICD-10-CM | POA: Diagnosis not present

## 2021-04-15 DIAGNOSIS — Z79899 Other long term (current) drug therapy: Secondary | ICD-10-CM | POA: Insufficient documentation

## 2021-04-15 DIAGNOSIS — I132 Hypertensive heart and chronic kidney disease with heart failure and with stage 5 chronic kidney disease, or end stage renal disease: Secondary | ICD-10-CM | POA: Insufficient documentation

## 2021-04-15 DIAGNOSIS — Z7982 Long term (current) use of aspirin: Secondary | ICD-10-CM | POA: Insufficient documentation

## 2021-04-15 DIAGNOSIS — E119 Type 2 diabetes mellitus without complications: Secondary | ICD-10-CM | POA: Diagnosis present

## 2021-04-15 DIAGNOSIS — E162 Hypoglycemia, unspecified: Secondary | ICD-10-CM | POA: Diagnosis not present

## 2021-04-15 DIAGNOSIS — Z992 Dependence on renal dialysis: Secondary | ICD-10-CM | POA: Insufficient documentation

## 2021-04-15 DIAGNOSIS — Z20822 Contact with and (suspected) exposure to covid-19: Secondary | ICD-10-CM | POA: Diagnosis not present

## 2021-04-15 DIAGNOSIS — F17200 Nicotine dependence, unspecified, uncomplicated: Secondary | ICD-10-CM | POA: Insufficient documentation

## 2021-04-15 DIAGNOSIS — R4182 Altered mental status, unspecified: Secondary | ICD-10-CM

## 2021-04-15 DIAGNOSIS — R739 Hyperglycemia, unspecified: Secondary | ICD-10-CM | POA: Diagnosis present

## 2021-04-15 DIAGNOSIS — E1122 Type 2 diabetes mellitus with diabetic chronic kidney disease: Secondary | ICD-10-CM | POA: Insufficient documentation

## 2021-04-15 DIAGNOSIS — E1165 Type 2 diabetes mellitus with hyperglycemia: Secondary | ICD-10-CM | POA: Diagnosis present

## 2021-04-15 LAB — CBG MONITORING, ED
Glucose-Capillary: 83 mg/dL (ref 70–99)
Glucose-Capillary: 53 mg/dL — ABNORMAL LOW (ref 70–99)
Glucose-Capillary: 98 mg/dL (ref 70–99)
Glucose-Capillary: 40 mg/dL — CL (ref 70–99)
Glucose-Capillary: 146 mg/dL — ABNORMAL HIGH (ref 70–99)
Glucose-Capillary: 231 mg/dL — ABNORMAL HIGH (ref 70–99)

## 2021-04-15 LAB — COMPREHENSIVE METABOLIC PANEL
ALT: 34 U/L (ref 0–44)
AST: 30 U/L (ref 15–41)
Albumin: 2.9 g/dL — ABNORMAL LOW (ref 3.5–5.0)
Alkaline Phosphatase: 91 U/L (ref 38–126)
Anion gap: 13 (ref 5–15)
BUN: 30 mg/dL — ABNORMAL HIGH (ref 8–23)
CO2: 24 mmol/L (ref 22–32)
Calcium: 6.6 mg/dL — ABNORMAL LOW (ref 8.9–10.3)
Chloride: 98 mmol/L (ref 98–111)
Creatinine, Ser: 6.59 mg/dL — ABNORMAL HIGH (ref 0.61–1.24)
GFR, Estimated: 9 mL/min — ABNORMAL LOW (ref >60)
Glucose, Bld: 78 mg/dL (ref 70–99)
Potassium: 5.4 mmol/L — ABNORMAL HIGH (ref 3.5–5.1)
Sodium: 135 mmol/L (ref 135–145)
Total Bilirubin: 1.6 mg/dL — ABNORMAL HIGH (ref 0.3–1.2)
Total Protein: 6.8 g/dL (ref 6.5–8.1)

## 2021-04-15 LAB — LACTIC ACID, PLASMA
Lactic Acid, Venous: 1.1 mmol/L (ref 0.5–1.9)
Lactic Acid, Venous: 0.8 mmol/L (ref 0.5–1.9)

## 2021-04-15 LAB — CBC
HCT: 44.1 % (ref 39.0–52.0)
Hemoglobin: 13.6 g/dL (ref 13.0–17.0)
MCH: 31.2 pg (ref 26.0–34.0)
MCHC: 30.8 g/dL (ref 30.0–36.0)
MCV: 101.1 fL — ABNORMAL HIGH (ref 80.0–100.0)
Platelets: 175 10*3/uL (ref 150–400)
RBC: 4.36 MIL/uL (ref 4.22–5.81)
RDW: 14.8 % (ref 11.5–15.5)
WBC: 7.6 10*3/uL (ref 4.0–10.5)
nRBC: 0 % (ref 0.0–0.2)

## 2021-04-15 LAB — RESP PANEL BY RT-PCR (FLU A&B, COVID) ARPGX2
Influenza A by PCR: NEGATIVE
Influenza B by PCR: NEGATIVE
SARS Coronavirus 2 by RT PCR: NEGATIVE

## 2021-04-15 LAB — CORTISOL: Cortisol, Plasma: 6.9 ug/dL

## 2021-04-15 MED ORDER — DEXTROSE 10 % IV SOLN: INTRAVENOUS | Status: DC

## 2021-04-15 MED ORDER — PANCRELIPASE (LIP-PROT-AMYL) 5000-24000 UNITS PO CPEP: 1.0000 | ORAL_CAPSULE | Freq: Three times a day (TID) | ORAL | Status: DC

## 2021-04-15 MED ORDER — PANTOPRAZOLE SODIUM 40 MG PO TBEC
40.0000 mg | DELAYED_RELEASE_TABLET | Freq: Every day | ORAL | Status: DC
  Administered 2021-04-16 – 2021-04-18 (×3): 40 mg via ORAL
  Filled 2021-04-15 (×3): qty 1

## 2021-04-15 MED ORDER — SODIUM CHLORIDE 0.9 % IV BOLUS
500.0000 mL | Freq: Once | INTRAVENOUS | Status: AC
  Administered 2021-04-15: 500 mL via INTRAVENOUS

## 2021-04-15 MED ORDER — ATORVASTATIN CALCIUM 10 MG PO TABS
20.0000 mg | ORAL_TABLET | Freq: Every day | ORAL | Status: DC
  Administered 2021-04-16 – 2021-04-18 (×3): 20 mg via ORAL
  Filled 2021-04-15 (×3): qty 2

## 2021-04-15 MED ORDER — SEVELAMER CARBONATE 800 MG PO TABS
800.0000 mg | ORAL_TABLET | Freq: Three times a day (TID) | ORAL | Status: DC
  Administered 2021-04-16: 800 mg via ORAL
  Filled 2021-04-15: qty 1

## 2021-04-15 MED ORDER — ASPIRIN 81 MG PO CHEW
81.0000 mg | CHEWABLE_TABLET | Freq: Every day | ORAL | Status: DC
  Administered 2021-04-16 – 2021-04-18 (×3): 81 mg via ORAL
  Filled 2021-04-15 (×3): qty 1

## 2021-04-15 MED ORDER — DEXTROSE 50 % IV SOLN
25.0000 mL | Freq: Once | INTRAVENOUS | Status: AC
  Administered 2021-04-15: 25 mL via INTRAVENOUS
  Filled 2021-04-15: qty 50

## 2021-04-15 MED ORDER — DONEPEZIL HCL 10 MG PO TABS
10.0000 mg | ORAL_TABLET | Freq: Every day | ORAL | Status: DC
  Administered 2021-04-16 – 2021-04-18 (×2): 10 mg via ORAL
  Filled 2021-04-15 (×3): qty 1

## 2021-04-15 MED ORDER — MIDODRINE HCL 5 MG PO TABS
5.0000 mg | ORAL_TABLET | ORAL | Status: DC
  Administered 2021-04-17: 5 mg via ORAL
  Filled 2021-04-15: qty 1

## 2021-04-15 MED ORDER — CALCIUM ACETATE (PHOS BINDER) 667 MG PO CAPS
1334.0000 mg | ORAL_CAPSULE | Freq: Three times a day (TID) | ORAL | Status: DC
  Administered 2021-04-16 – 2021-04-18 (×6): 1334 mg via ORAL
  Filled 2021-04-15 (×6): qty 2

## 2021-04-15 MED ORDER — DEXTROSE 50 % IV SOLN
25.0000 g | Freq: Once | INTRAVENOUS | Status: AC
  Administered 2021-04-15: 25 g via INTRAVENOUS
  Filled 2021-04-15: qty 50

## 2021-04-15 NOTE — ED Triage Notes (Signed)
Pt BIB [facility] from [facility] center d/t staff reporting pt rolled OOB into floor. Pt reports he has been feeling weak for the past 4 days with n/v/d. He was last dialyzed yesterday, access in in Rt arm, v/s 108/62, 98 bpm, 98% on RA. EMS reports a right lower limb & amputation as well as a few fingers. Pt denies LOC, does take daily ASA, denies pain at this time.

## 2021-04-15 NOTE — ED Provider Notes (Signed)
[facility] EMERGENCY DEPARTMENT Provider Note   CSN: [id number] Arrival date & time: 04/15/21  1649     History Chief Complaint  Patient presents with  . Fall  . Weakness    [name] is a 65 y.o. male.  HPI  5 caveat secondary to altered mental status 65 year old man history of end-stage renal disease, on dialysis, syncope, Covid, DKA CHF presents today with reports that he has been feeling weak for the past 4 days with nausea, vomiting, diarrhea.  Reports he last had dialysis yesterday.  He reports that he has been "sick".  He states he has been sick to his stomach.    Past Medical History:  Diagnosis Date  . Diabetes mellitus without complication ([place])   . ESRD on hemodialysis ([place])   . Hypertension     Patient Active Problem List   Diagnosis Date Noted  . DKA (diabetic ketoacidosis) ([place]) 01/24/2021  . COVID-19 virus infection 01/24/2021  . Syncope 01/24/2021  . ESRD (end stage renal disease) ([place]) 01/24/2021  . Congestive heart failure (CHF) ([place]) 01/24/2021  . Hypotension 01/24/2021  . Shock ([place]) 01/24/2021    Past Surgical History:  Procedure Laterality Date  . BACK SURGERY    . BELOW KNEE LEG AMPUTATION         History reviewed. No pertinent family history.  Social History   Tobacco Use  . Smoking status: Current Every Day Smoker  . Smokeless tobacco: Never Used  Substance Use Topics  . Alcohol use: Not Currently  . Drug use: Not Currently    Home Medications Prior to Admission medications   Medication Sig Start Date End Date Taking? Authorizing Provider  aspirin 81 MG chewable tablet Chew 1 tablet (81 mg total) by mouth daily. 02/10/21   Charlynne Cousins, MD  atorvastatin (LIPITOR) 20 MG tablet Take 1 tablet (20 mg total) by mouth daily. 02/10/21   Charlynne Cousins, MD  blood glucose meter kit and supplies KIT Dispense based on patient and insurance preference. Use up to four times daily as directed. (FOR ICD-9  250.00, 250.01). 01/30/21   Donne Hazel, MD  calcium acetate (PHOSLO) 667 MG capsule Take 1,334 mg by mouth 3 (three) times daily with meals. 02/24/14   [provider]  donepezil (ARICEPT) 10 MG tablet Take 10 mg by mouth daily. 12/30/20   [provider]  Dulaglutide (TRULICITY) 1.5 BT/5.1VO SOPN Inject 1.5 mg into the skin every Thursday.    [provider]  gabapentin (NEURONTIN) 100 MG capsule Take 100 mg by mouth 3 (three) times daily. 12/30/20   [provider]  glucagon 1 MG injection Inject 1 mg into the muscle daily as needed (low blood sugar). 06/05/18   [provider]  insulin aspart (NOVOLOG FLEXPEN) 100 UNIT/ML FlexPen Inject 10 Units into the skin 3 (three) times daily as needed for high blood sugar.    [provider]  insulin glargine (SEMGLEE) 100 UNIT/ML Solostar Pen Inject into the skin daily.    [provider]  midodrine (PROAMATINE) 5 MG tablet Take 5 mg by mouth See admin instructions. Take one tablet (5 mg) by mouth Tuesday, Thursday, Saturday before dialysis 04/04/20   [provider]  naproxen sodium (ALEVE) 220 MG tablet Take 440 mg by mouth 2 (two) times daily as needed (pain/headache).    [provider]  omeprazole (PRILOSEC) 20 MG capsule Take 20 mg by mouth 2 (two) times daily before a meal.  [provider]  ondansetron (ZOFRAN) 4 MG tablet Take 4 mg by mouth every 8 (eight) hours as needed for nausea or vomiting. 02/02/18   [provider]  Pancrelipase, Lip-Prot-Amyl, (ZENPEP) 5000-24000 units CPEP Take 1 capsule by mouth 3 (three) times daily with meals.    [provider]  pantoprazole (PROTONIX) 20 MG tablet Take 1 tablet (20 mg total) by mouth daily. 02/10/21   Charlynne Cousins, MD  rosuvastatin (CRESTOR) 10 MG tablet Take 10 mg by mouth at bedtime. 12/30/20   [provider]  sevelamer carbonate (RENVELA) 800 MG tablet Take 800 mg by mouth 3 (three)  times daily with meals. 12/30/20   [provider]    Allergies    Acetaminophen, Prednisone, and Ivp dye [iodinated diagnostic agents]  Review of Systems   Review of Systems  Unable to perform ROS: Mental status change    Physical Exam Updated Vital Signs BP (!) 80/53   Pulse 91   Temp 98.5 F (36.9 C) (Oral)   Resp 20   SpO2 98%   Physical Exam Vitals and nursing note reviewed.  Constitutional:      General: He is not in acute distress.    Appearance: He is ill-appearing.     Comments: Patient somnolent on my evaluation  HENT:     Head: Normocephalic and atraumatic.     Right Ear: External ear normal.     Left Ear: External ear normal.     Nose: Nose normal.     Mouth/Throat:     Mouth: Mucous membranes are dry.  Eyes:     Pupils: Pupils are equal, round, and reactive to light.  Cardiovascular:     Rate and Rhythm: Normal rate and regular rhythm.     Pulses: Normal pulses.     Heart sounds: Normal heart sounds.  Pulmonary:     Effort: Pulmonary effort is normal.     Breath sounds: Normal breath sounds.  Abdominal:     General: Abdomen is flat.     Palpations: Abdomen is soft.  Musculoskeletal:        General: Normal range of motion.     Cervical back: Normal range of motion.     Comments: Right lower extremity with BKA and no signs of skin breakdown Dialysis access left chest wall No signs of erythema, warmth around catheter site  Skin:    General: Skin is warm and dry.     Capillary Refill: Capillary refill takes less than 2 seconds.  Neurological:     General: No focal deficit present.     Comments: Patient difficult to wake up and seems somewhat confused but tells me he has been "sick".  Requires constant verbal and tactile stimuli to wake     ED Results / Procedures / Treatments   Labs (all labs ordered are listed, but only abnormal results are displayed) Labs Reviewed  CULTURE, BLOOD (ROUTINE X 2)  CULTURE, BLOOD (ROUTINE X 2)  CBC   COMPREHENSIVE METABOLIC PANEL  RAPID URINE DRUG SCREEN, HOSP PERFORMED  LACTIC ACID, PLASMA  LACTIC ACID, PLASMA  URINALYSIS, ROUTINE W REFLEX MICROSCOPIC  CBG MONITORING, ED    EKG EKG Interpretation  Date/Time:  Sunday April 15 2021 17:01:51 EDT Ventricular Rate:  98 PR Interval:  207 QRS Duration: 129 QT Interval:  374 QTC Calculation: 478 R Axis:   -60 Text Interpretation: Sinus rhythm Borderline prolonged PR interval Nonspecific IVCD with LAD Anteroseptal infarct, old Nonspecific T abnormalities, lateral  leads Confirmed by Pattricia Boss [phone]) on 04/15/2021 8:57:01 PM   Radiology DG Chest Port 1 View  Result Date: 04/15/2021 CLINICAL DATA:  Weakness, hypertension EXAM: PORTABLE CHEST 1 VIEW COMPARISON:  02/07/2021 FINDINGS: Lungs are clear. No pneumothorax or pleural effusion. Cardiac size within normal limits. Left internal jugular hemodialysis catheter tips noted within the right atrium. Vascular stents noted within the right axilla. Cervical fusion hardware noted. No acute bone abnormality IMPRESSION: No active disease. Electronically Signed   By: Fidela Salisbury MD   On: 04/15/2021 19:01    Procedures .Critical Care Performed by: Pattricia Boss, MD Authorized by: Pattricia Boss, MD   Critical care provider statement:    Critical care time (minutes):  60   Critical care end time:  04/15/2021 9:53 PM   Critical care was necessary to treat or prevent imminent or life-threatening deterioration of the following conditions:  CNS failure or compromise   Critical care was time spent personally by me on the following activities:  Discussions with consultants, evaluation of patient's response to treatment, examination of patient, ordering and performing treatments and interventions, ordering and review of laboratory studies, ordering and review of radiographic studies, pulse oximetry, re-evaluation of patient's condition, obtaining history from patient or surrogate and review of old  charts     Medications Ordered in ED Medications  sodium chloride 0.9 % bolus 500 mL (has no administration in time range)    ED Course  I have reviewed the triage vital signs and the nursing notes.  Pertinent labs & imaging results that were available during my care of the patient were reviewed by me and considered in my medical decision making (see chart for details).  65 year old male on dialysis presents today with decreased mental status, hypotension, and hypoglycemia.  On my initial evaluation blood sugar was 80.  He received a 25 and blood sugar dropped to 40 again.  Blood sugar has not been back up patient is more responsive.  However he continues to have some hypotension with blood pressure 93/60 he has been given fluids judiciously.  Will not give full 30 cc/kg boluses blood pressure is 93/61 have 1 blood pressure of 75/55 which is rebounded with 500 cc nursing  Had blood cultures obtained.  Blood pressure has improved with 500 cc bolus. Leukocytosis noted and no probable source seen on exam. Patient has required multiple doses of IV glucose due to hyoglycemia and has had improved mental status with increased blood sugar. Plan admission ongoing blood sugar monitoring With Dr. Posey Pronto who will place him on nephrology to see this To Dr. Hal Hope who will see permission Additional D5025 g ordered and D10 to hang 50 mL/h MDM Rules/Calculators/A&P                           Final Clinical Impression(s) / ED Diagnoses Final diagnoses:  Hypoglycemia  Altered mental status, unspecified altered mental status type  ESRD (end stage renal disease) [facility])    Rx / DC Orders ED Discharge Orders    None       Pattricia Boss, MD 04/15/21 2153

## 2021-04-15 NOTE — H&P (Signed)
History and Physical    Jose Robinson VXY:[id number] DOB: June 10, 1956 DOA: 04/15/2021  PCP: Pcp, No  Patient coming from: Assisted living facility.  Chief Complaint: Fall and confusion.  HPI: Jose Robinson is a 65 y.o. male with history of ESRD on hemodialysis on Tuesday Thursdays and Saturday who has had dialysis yesterday presents to the ER after patient had a fall while try to get out of the bed and was found to be confused.  EMS was called patient blood sugar was found in the 70s.  Patient states over the last 3 days patient has been having nausea vomiting and diarrhea.  Per patient by other people in the facility having similar complaints.  Patient state despite not eating he was still taking his insulin.  ED Course: In the ER patient blood sugar has dropped to the 40s had to be started on D10.  Labs show mild hyperkalemia of 5.4 EKG shows sinus rhythm Covid test was negative.  Chest x-ray unremarkable.  Patient admitted for hypoglycemia.  CT head was unremarkable for any acute.  On-call nephrologist was consulted by the ER physician.  Review of Systems: As per HPI, rest all negative.   Past Medical History:  Diagnosis Date  . Diabetes mellitus without complication ([place])   . ESRD on hemodialysis ([place])   . Hypertension     Past Surgical History:  Procedure Laterality Date  . BACK SURGERY    . BELOW KNEE LEG AMPUTATION       reports that he has been smoking. He has never used smokeless tobacco. He reports previous alcohol use. He reports previous drug use.  Allergies  Allergen Reactions  . Acetaminophen Palpitations  . Prednisone Other (See Comments)    Severe hallucinations/paranoid delusions after steroid premedications for contrast allergy, required IV haldol and restraints.   [name] [Iodinated Diagnostic Agents] Other (See Comments)    Family History  Family history unknown: Yes    Prior to Admission medications   Medication Sig Start Date End Date Taking?  Authorizing Provider  aspirin 81 MG chewable tablet Chew 1 tablet (81 mg total) by mouth daily. 02/10/21   Charlynne Cousins, MD  atorvastatin (LIPITOR) 20 MG tablet Take 1 tablet (20 mg total) by mouth daily. 02/10/21   Charlynne Cousins, MD  blood glucose meter kit and supplies KIT Dispense based on patient and insurance preference. Use up to four times daily as directed. (FOR ICD-9 250.00, 250.01). 01/30/21   Donne Hazel, MD  calcium acetate (PHOSLO) 667 MG capsule Take 1,334 mg by mouth 3 (three) times daily with meals. 02/24/14   [provider]  donepezil (ARICEPT) 10 MG tablet Take 10 mg by mouth daily. 12/30/20   [provider]  Dulaglutide (TRULICITY) 1.5 MO/7.0BE SOPN Inject 1.5 mg into the skin every Thursday.    [provider]  gabapentin (NEURONTIN) 100 MG capsule Take 100 mg by mouth 3 (three) times daily. 12/30/20   [provider]  glucagon 1 MG injection Inject 1 mg into the muscle daily as needed (low blood sugar). 06/05/18   [provider]  insulin aspart (NOVOLOG FLEXPEN) 100 UNIT/ML FlexPen Inject 10 Units into the skin 3 (three) times daily as needed for high blood sugar.    [provider]  insulin glargine (SEMGLEE) 100 UNIT/ML Solostar Pen Inject into the skin daily.    [provider]  midodrine (PROAMATINE) 5 MG tablet Take 5 mg by mouth See admin instructions.  Take one tablet (5 mg) by mouth Tuesday, Thursday, Saturday before dialysis 04/04/20   [provider]  naproxen sodium (ALEVE) 220 MG tablet Take 440 mg by mouth 2 (two) times daily as needed (pain/headache).    [provider]  omeprazole (PRILOSEC) 20 MG capsule Take 20 mg by mouth 2 (two) times daily before a meal.    [provider]  ondansetron (ZOFRAN) 4 MG tablet Take 4 mg by mouth every 8 (eight) hours as needed for nausea or vomiting. 02/02/18   [provider]  Pancrelipase, Lip-Prot-Amyl, (ZENPEP) 5000-24000  units CPEP Take 1 capsule by mouth 3 (three) times daily with meals.    [provider]  pantoprazole (PROTONIX) 20 MG tablet Take 1 tablet (20 mg total) by mouth daily. 02/10/21   Charlynne Cousins, MD  rosuvastatin (CRESTOR) 10 MG tablet Take 10 mg by mouth at bedtime. 12/30/20   [provider]  sevelamer carbonate (RENVELA) 800 MG tablet Take 800 mg by mouth 3 (three) times daily with meals. 12/30/20   [provider]    Physical Exam: Constitutional: Moderately built and nourished. Vitals:   04/15/21 2009 04/15/21 2026 04/15/21 2041 04/15/21 2058  BP: 93/65  93/61 121/87  Pulse: 82   86  Resp: '12  18 20  ' Temp:  97.9 F (36.6 C)    TempSrc:  Rectal    SpO2: 99%   99%   Eyes: Anicteric no pallor. ENMT: No discharge from the ears eyes nose or mouth. Neck: No mass felt.  No neck rigidity. Respiratory: No rhonchi or crepitations. Cardiovascular: S1-S2 heard. Abdomen: Soft nontender bowel sounds present. Musculoskeletal: Right BKA. Skin: No rash. Neurologic: Alert awake oriented time place and person.  Moves all extremities. Psychiatric: Appears normal.  Normal affect.   Labs on Admission: I have personally reviewed following labs and imaging studies  CBC: Recent Labs  Lab 04/15/21 1802  WBC 7.6  HGB 13.6  HCT 44.1  MCV 101.1*  PLT 573   Basic Metabolic Panel: Recent Labs  Lab 04/15/21 1802  NA 135  K 5.4*  CL 98  CO2 24  GLUCOSE 78  BUN 30*  CREATININE 6.59*  CALCIUM 6.6*   GFR: CrCl cannot be calculated (Unknown ideal weight.). Liver Function Tests: Recent Labs  Lab 04/15/21 1802  AST 30  ALT 34  ALKPHOS 91  BILITOT 1.6*  PROT 6.8  ALBUMIN 2.9*   No results for input(s): LIPASE, AMYLASE in the last 168 hours. No results for input(s): AMMONIA in the last 168 hours. Coagulation Profile: No results for input(s): INR, PROTIME in the last 168 hours. Cardiac Enzymes: No results for input(s): CKTOTAL, CKMB, CKMBINDEX,  TROPONINI in the last 168 hours. BNP (last 3 results) No results for input(s): PROBNP in the last 8760 hours. HbA1C: No results for input(s): HGBA1C in the last 72 hours. CBG: Recent Labs  Lab 04/15/21 1754 04/15/21 1838 04/15/21 1955 04/15/21 2152  GLUCAP 83 53* 98 40*   Lipid Profile: No results for input(s): CHOL, HDL, LDLCALC, TRIG, CHOLHDL, LDLDIRECT in the last 72 hours. Thyroid Function Tests: No results for input(s): TSH, T4TOTAL, FREET4, T3FREE, THYROIDAB in the last 72 hours. Anemia Panel: No results for input(s): VITAMINB12, FOLATE, FERRITIN, TIBC, IRON, RETICCTPCT in the last 72 hours. Urine analysis: No results found for: COLORURINE, APPEARANCEUR, LABSPEC, PHURINE, GLUCOSEU, HGBUR, BILIRUBINUR, KETONESUR, PROTEINUR, UROBILINOGEN, NITRITE, LEUKOCYTESUR Sepsis Labs: '@LABRCNTIP' (procalcitonin:4,lacticidven:4) )No results found for this or any previous visit (from the past 240 hour(s)).  Radiological Exams on Admission: DG Chest Port 1 View  Result Date: 04/15/2021 CLINICAL DATA:  Weakness, hypertension EXAM: PORTABLE CHEST 1 VIEW COMPARISON:  02/07/2021 FINDINGS: Lungs are clear. No pneumothorax or pleural effusion. Cardiac size within normal limits. Left internal jugular hemodialysis catheter tips noted within the right atrium. Vascular stents noted within the right axilla. Cervical fusion hardware noted. No acute bone abnormality IMPRESSION: No active disease. Electronically Signed   By: Fidela Salisbury MD   On: 04/15/2021 19:01    EKG: Independently reviewed.  Normal sinus rhythm.  Assessment/Plan Principal Problem:   Hypoglycemia Active Problems:   ESRD (end stage renal disease) (HCC)   Hyperglycemia    1. Hypoglycemia -likely precipitated by patient not eating well and taking insulin.  Patient is presently on D10.  We will closely follow CBGs.  Hold Lantus for now.  Note that patient was admitted for DKA 2 months ago at that time hemoglobin A1c was around  10.4. 2. Nausea vomiting diarrhea likely viral gastroenteritis.  Will follow LFTs.  Abdomen appears benign on exam.  If symptoms do not improve may consider further imaging. 3. ESRD on hemodialysis on Tuesday Thursday Saturday.  Nephrology was notified.  Follow metabolic panel.  Since patient is receiving fluids closely monitor respiratory status.   DVT prophylaxis: Heparin. Code Status: Full code. Family Communication: Discussed with patient. Disposition Plan: Back to facility when stable. Consults called: ER physician notified nephrology. Admission status: Observation.   Rise Patience MD [facility] Pager [phone].  If 7PM-7AM, please contact night-coverage [email or web address] Password [email or web address]  04/15/2021, 10:03 PM   '

## 2021-04-15 NOTE — ED Notes (Signed)
[facility] [place] notified of pts low bp

## 2021-04-15 NOTE — ED Notes (Signed)
One set of blood cultures obtained °

## 2021-04-16 DIAGNOSIS — E1121 Type 2 diabetes mellitus with diabetic nephropathy: Secondary | ICD-10-CM | POA: Diagnosis present

## 2021-04-16 DIAGNOSIS — E0821 Diabetes mellitus due to underlying condition with diabetic nephropathy: Secondary | ICD-10-CM

## 2021-04-16 DIAGNOSIS — E1165 Type 2 diabetes mellitus with hyperglycemia: Secondary | ICD-10-CM

## 2021-04-16 DIAGNOSIS — R4182 Altered mental status, unspecified: Secondary | ICD-10-CM

## 2021-04-16 DIAGNOSIS — E119 Type 2 diabetes mellitus without complications: Secondary | ICD-10-CM | POA: Diagnosis present

## 2021-04-16 DIAGNOSIS — Z992 Dependence on renal dialysis: Secondary | ICD-10-CM

## 2021-04-16 DIAGNOSIS — E160 Drug-induced hypoglycemia without coma: Secondary | ICD-10-CM

## 2021-04-16 DIAGNOSIS — E162 Hypoglycemia, unspecified: Secondary | ICD-10-CM | POA: Diagnosis not present

## 2021-04-16 DIAGNOSIS — E118 Type 2 diabetes mellitus with unspecified complications: Secondary | ICD-10-CM

## 2021-04-16 DIAGNOSIS — E11649 Type 2 diabetes mellitus with hypoglycemia without coma: Secondary | ICD-10-CM | POA: Diagnosis not present

## 2021-04-16 DIAGNOSIS — I5022 Chronic systolic (congestive) heart failure: Secondary | ICD-10-CM | POA: Diagnosis not present

## 2021-04-16 DIAGNOSIS — T383X5A Adverse effect of insulin and oral hypoglycemic [antidiabetic] drugs, initial encounter: Secondary | ICD-10-CM

## 2021-04-16 LAB — COMPREHENSIVE METABOLIC PANEL
ALT: 30 U/L (ref 0–44)
AST: 23 U/L (ref 15–41)
Albumin: 2.8 g/dL — ABNORMAL LOW (ref 3.5–5.0)
Alkaline Phosphatase: 81 U/L (ref 38–126)
Anion gap: 13 (ref 5–15)
BUN: 35 mg/dL — ABNORMAL HIGH (ref 8–23)
CO2: 24 mmol/L (ref 22–32)
Calcium: 6.5 mg/dL — ABNORMAL LOW (ref 8.9–10.3)
Chloride: 99 mmol/L (ref 98–111)
Creatinine, Ser: 7.42 mg/dL — ABNORMAL HIGH (ref 0.61–1.24)
GFR, Estimated: 8 mL/min — ABNORMAL LOW (ref >60)
Glucose, Bld: 89 mg/dL (ref 70–99)
Potassium: 6.4 mmol/L (ref 3.5–5.1)
Sodium: 136 mmol/L (ref 135–145)
Total Bilirubin: 1.7 mg/dL — ABNORMAL HIGH (ref 0.3–1.2)
Total Protein: 6.8 g/dL (ref 6.5–8.1)

## 2021-04-16 LAB — HEMOGLOBIN A1C
Hgb A1c MFr Bld: 5.5 % (ref 4.8–5.6)
Mean Plasma Glucose: 111 mg/dL

## 2021-04-16 LAB — CBC WITH DIFFERENTIAL/PLATELET
Abs Immature Granulocytes: 0.02 10*3/uL (ref 0.00–0.07)
Basophils Absolute: 0 10*3/uL (ref 0.0–0.1)
Basophils Relative: 0 %
Eosinophils Absolute: 0.1 10*3/uL (ref 0.0–0.5)
Eosinophils Relative: 2 %
HCT: 42.3 % (ref 39.0–52.0)
Hemoglobin: 13.2 g/dL (ref 13.0–17.0)
Immature Granulocytes: 0 %
Lymphocytes Relative: 8 %
Lymphs Abs: 0.5 10*3/uL — ABNORMAL LOW (ref 0.7–4.0)
MCH: 31.1 pg (ref 26.0–34.0)
MCHC: 31.2 g/dL (ref 30.0–36.0)
MCV: 99.8 fL (ref 80.0–100.0)
Monocytes Absolute: 0.6 10*3/uL (ref 0.1–1.0)
Monocytes Relative: 10 %
Neutro Abs: 4.9 10*3/uL (ref 1.7–7.7)
Neutrophils Relative %: 80 %
Platelets: 171 10*3/uL (ref 150–400)
RBC: 4.24 MIL/uL (ref 4.22–5.81)
RDW: 14.7 % (ref 11.5–15.5)
WBC: 6.2 10*3/uL (ref 4.0–10.5)
nRBC: 0 % (ref 0.0–0.2)

## 2021-04-16 LAB — GLUCOSE, CAPILLARY
Glucose-Capillary: 66 mg/dL — ABNORMAL LOW (ref 70–99)
Glucose-Capillary: 81 mg/dL (ref 70–99)
Glucose-Capillary: 69 mg/dL — ABNORMAL LOW (ref 70–99)
Glucose-Capillary: 125 mg/dL — ABNORMAL HIGH (ref 70–99)
Glucose-Capillary: 222 mg/dL — ABNORMAL HIGH (ref 70–99)
Glucose-Capillary: 204 mg/dL — ABNORMAL HIGH (ref 70–99)

## 2021-04-16 LAB — HEPATITIS B SURFACE ANTIGEN: Hepatitis B Surface Ag: NONREACTIVE

## 2021-04-16 LAB — TROPONIN I (HIGH SENSITIVITY)
Troponin I (High Sensitivity): 16 ng/L (ref <18)
Troponin I (High Sensitivity): 16 ng/L (ref <18)
Troponin I (High Sensitivity): 15 ng/L (ref <18)

## 2021-04-16 LAB — MAGNESIUM: Magnesium: 1.9 mg/dL (ref 1.7–2.4)

## 2021-04-16 LAB — CBG MONITORING, ED
Glucose-Capillary: 66 mg/dL — ABNORMAL LOW (ref 70–99)
Glucose-Capillary: 72 mg/dL (ref 70–99)

## 2021-04-16 LAB — PHOSPHORUS: Phosphorus: 5.1 mg/dL — ABNORMAL HIGH (ref 2.5–4.6)

## 2021-04-16 LAB — MRSA PCR SCREENING: MRSA by PCR: POSITIVE — AB

## 2021-04-16 MED ORDER — CHLORHEXIDINE GLUCONATE CLOTH 2 % EX PADS
6.0000 | MEDICATED_PAD | Freq: Every day | CUTANEOUS | Status: DC
  Administered 2021-04-16 – 2021-04-18 (×3): 6 via TOPICAL

## 2021-04-16 MED ORDER — PROSOURCE PLUS PO LIQD
30.0000 mL | Freq: Three times a day (TID) | ORAL | Status: DC
  Administered 2021-04-16 – 2021-04-18 (×3): 30 mL via ORAL
  Filled 2021-04-16 (×3): qty 30

## 2021-04-16 MED ORDER — CHLORHEXIDINE GLUCONATE CLOTH 2 % EX PADS: 6.0000 | MEDICATED_PAD | Freq: Every day | CUTANEOUS | Status: DC

## 2021-04-16 MED ORDER — SODIUM ZIRCONIUM CYCLOSILICATE 10 G PO PACK: 10.0000 g | PACK | Freq: Once | ORAL | Status: DC

## 2021-04-16 MED ORDER — GABAPENTIN 100 MG PO CAPS
200.0000 mg | ORAL_CAPSULE | Freq: Two times a day (BID) | ORAL | Status: DC
  Administered 2021-04-16 – 2021-04-18 (×4): 200 mg via ORAL
  Filled 2021-04-16 (×4): qty 2

## 2021-04-16 MED ORDER — GABAPENTIN 100 MG PO CAPS: 200.0000 mg | ORAL_CAPSULE | Freq: Three times a day (TID) | ORAL | Status: DC

## 2021-04-16 MED ORDER — HEPARIN SODIUM (PORCINE) 1000 UNIT/ML IJ SOLN
INTRAMUSCULAR | Status: AC
  Administered 2021-04-16: 4800 [IU]
  Filled 2021-04-16: qty 5

## 2021-04-16 MED ORDER — LIDOCAINE 5 % EX PTCH
1.0000 | MEDICATED_PATCH | CUTANEOUS | Status: DC
  Administered 2021-04-16 – 2021-04-17 (×2): 1 via TRANSDERMAL
  Filled 2021-04-16 (×3): qty 1

## 2021-04-16 MED ORDER — GUAIFENESIN-DM 100-10 MG/5ML PO SYRP
5.0000 mL | ORAL_SOLUTION | ORAL | Status: DC | PRN
  Administered 2021-04-16: 5 mL via ORAL
  Filled 2021-04-16: qty 5

## 2021-04-16 MED ORDER — HEPARIN SODIUM (PORCINE) 5000 UNIT/ML IJ SOLN
5000.0000 [IU] | Freq: Three times a day (TID) | INTRAMUSCULAR | Status: DC
  Administered 2021-04-16 – 2021-04-18 (×5): 5000 [IU] via SUBCUTANEOUS
  Filled 2021-04-16 (×5): qty 1

## 2021-04-16 MED ORDER — CALCITRIOL 0.25 MCG PO CAPS
1.0000 ug | ORAL_CAPSULE | ORAL | Status: DC
  Administered 2021-04-17: 1 ug via ORAL
  Filled 2021-04-16: qty 4

## 2021-04-16 MED ORDER — RENA-VITE PO TABS
1.0000 | ORAL_TABLET | Freq: Every day | ORAL | Status: DC
  Administered 2021-04-16 – 2021-04-17 (×2): 1 via ORAL
  Filled 2021-04-16 (×2): qty 1

## 2021-04-16 MED ORDER — PANCRELIPASE (LIP-PROT-AMYL) 12000-38000 UNITS PO CPEP
24000.0000 [IU] | ORAL_CAPSULE | Freq: Three times a day (TID) | ORAL | Status: DC
  Administered 2021-04-16 – 2021-04-18 (×7): 24000 [IU] via ORAL
  Filled 2021-04-16 (×7): qty 2

## 2021-04-16 MED ORDER — NEPRO/CARBSTEADY PO LIQD: 237.0000 mL | Freq: Two times a day (BID) | ORAL | Status: DC

## 2021-04-16 MED ORDER — GUAIFENESIN-DM 100-10 MG/5ML PO SYRP
10.0000 mL | ORAL_SOLUTION | ORAL | Status: DC | PRN
Start: 2021-04-16 — End: 2021-04-18
  Administered 2021-04-16 – 2021-04-17 (×5): 10 mL via ORAL
  Filled 2021-04-16 (×6): qty 10

## 2021-04-16 MED ORDER — GLUCOSE 4 G PO CHEW
2.0000 | CHEWABLE_TABLET | Freq: Four times a day (QID) | ORAL | Status: DC
  Filled 2021-04-16 (×4): qty 2

## 2021-04-16 MED ORDER — MUPIROCIN 2 % EX OINT
1.0000 "application " | TOPICAL_OINTMENT | Freq: Two times a day (BID) | CUTANEOUS | Status: DC
  Administered 2021-04-16 – 2021-04-18 (×5): 1 via NASAL
  Filled 2021-04-16: qty 22

## 2021-04-16 MED ORDER — GLUCOSE 4 G PO CHEW
1.0000 | CHEWABLE_TABLET | ORAL | Status: DC | PRN
Start: 2021-04-16 — End: 2021-04-16
  Filled 2021-04-16: qty 2

## 2021-04-16 NOTE — Consult Note (Signed)
[facility] Renal Consultation Note    Indication for Consultation:  Management of ESRD/hemodialysis, anemia, hypertension/volume, and secondary hyperparathyroidism.  HPI: Jose Robinson is a 65 y.o. male with PMH including ESRD on dialysis TTS, HTN, and T2DM, who presented to the ER on 04/15/21 after falling at his SNF. Blood glucose was noted to be low by EMS. Patient reports he has felt poorly for about 4 days. Reports nausea, vomiting, subjective fevers and chills. He also reports they were unable to remove any fluid with dialysis on Saturday due to soft blood pressures, and he feels his is volume overloaded. Reports mild shortness of breath, chest pressure with deep breaths, and edema in his legs. Currently on O2 via  but appears SpO2 was stable on RA. CXR showed no active disease. In the ED, BS continued to drop to 40 and IV D10 was started. He was also noted to be hypotensive and was given several fluid boluses with improvement in BP. Labs notable for K+ 5.4, BUN 30, Cr 6.59, Alb 2.9 WBC 7.6, Hgb 13.6, Plt 175.   Past Medical History:  Diagnosis Date  . Diabetes mellitus without complication ([place])   . ESRD on hemodialysis ([place])   . Hypertension    Past Surgical History:  Procedure Laterality Date  . BACK SURGERY    . BELOW KNEE LEG AMPUTATION     Family History  Family history unknown: Yes   Social History:  reports that he has been smoking. He has never used smokeless tobacco. He reports previous alcohol use. He reports previous drug use.  ROS: As per HPI otherwise negative.  Review of Systems: Gen: Reports fever, chills, fatigue, weakness, decreased appetite HEENT: Denies headache and blurry vision CV: Reports orthopnea, PND, peripheral edema. Mild chest pressure with deep breaths. Denies dizziness or heart palpitations.  Resp: Reports orthopnea, dyspnea, and non-productive cough GI: Denies abdominal pain. Reports nausea.  GU: Makes very little urine. Denies  dysuria Derm: Denies pain/redness around [facility] site Heme: No abnormal bleeding reported Endocrine: + hypoglycemia  Physical Exam: Vitals:   04/15/21 2328 04/16/21 0115 04/16/21 0245 04/16/21 0333  BP: 101/68 110/74 125/80 106/71  Pulse:  84  76  Resp: 18 (!) '21 19 20  ' Temp:    97.6 F (36.4 C)  TempSrc:    Oral  SpO2: 98% 99% 98% 96%  Weight:    75.4 kg  Height:    '5\' 11"'  (1.803 m)     General: Well developed, fatigued appearing male, alert and oriented x3 Head: Normocephalic, atraumatic, sclera non-icteric, mucus membranes are moist. Neck: JVD not elevated. Lungs: Respirations unlabored. No wheezing, rhonchi or rales Heart: RRR with normal S1, S2. No murmurs, rubs, or gallops appreciated. Abdomen: Soft, non-tender, non-distended with normoactive bowel sounds. No rebound/guarding. No obvious abdominal masses. Musculoskeletal:  Strength and tone appear normal for age. Lower extremities: trace pretibial edema LLE Neuro: Alert and oriented X 3. Moves all extremities spontaneously. Psych:  Responds to questions appropriately with a normal affect. Dialysis Access: R IJ TDC with clean, dry bandage. No erythema or drainage noted.   Allergies  Allergen Reactions  . Acetaminophen Palpitations  . Prednisone Other (See Comments)    Severe hallucinations/paranoid delusions after steroid premedications for contrast allergy, required IV haldol and restraints.   [name] [Iodinated Diagnostic Agents] Other (See Comments)   Prior to Admission medications   Medication Sig Start Date End Date Taking? Authorizing Provider  aspirin 81 MG chewable tablet Chew 1 tablet (  81 mg total) by mouth daily. 02/10/21  Yes Charlynne Cousins, MD  atorvastatin (LIPITOR) 20 MG tablet Take 1 tablet (20 mg total) by mouth daily. 02/10/21  Yes Charlynne Cousins, MD  blood glucose meter kit and supplies KIT Dispense based on patient and insurance preference. Use up to four times daily as directed. (FOR ICD-9  250.00, 250.01). 01/30/21  Yes Donne Hazel, MD  calcium carbonate (TUMS - DOSED IN MG ELEMENTAL CALCIUM) 500 MG chewable tablet Chew 2 tablets by mouth daily as needed (gas).   Yes [provider]  donepezil (ARICEPT) 10 MG tablet Take 10 mg by mouth daily. 12/30/20  Yes [provider]  Dulaglutide (TRULICITY) 1.5 BP/1.0CH SOPN Inject 1.5 mg into the skin every Thursday.   Yes [provider]  gabapentin (NEURONTIN) 100 MG capsule Take 200 mg by mouth 3 (three) times daily. 12/30/20  Yes [provider]  glucagon 1 MG injection Inject 1 mg into the muscle daily as needed (low blood sugar). 06/05/18  Yes [provider]  insulin aspart (NOVOLOG FLEXPEN) 100 UNIT/ML FlexPen Inject 3 Units into the skin 3 (three) times daily with meals.   Yes [provider]  Insulin Glargine (LANTUS SOLOSTAR [place]) Inject 20 Units into the skin in the morning and at bedtime.   Yes [provider]  loperamide (IMODIUM A-D) 2 MG tablet Take 2-4 mg by mouth See admin instructions. 4 mg after 1st loose stool, then 2 mg after each loose stool. Max 4 tabs in 24 hours   Yes [provider]  midodrine (PROAMATINE) 5 MG tablet Take 5 mg by mouth See admin instructions. Take one tablet (5 mg) by mouth Tuesday, Thursday, Saturday before dialysis 04/04/20  Yes [provider]  naproxen sodium (ALEVE) 220 MG tablet Take 440 mg by mouth 2 (two) times daily as needed (pain/headache).   Yes [provider]  omeprazole (PRILOSEC) 20 MG capsule Take 20 mg by mouth 2 (two) times daily before a meal.   Yes [provider]  ondansetron (ZOFRAN) 4 MG tablet Take 4 mg by mouth every 8 (eight) hours as needed for nausea or vomiting. 02/02/18  Yes [provider]  Pancrelipase, Lip-Prot-Amyl, (ZENPEP) 5000-24000 units CPEP Take 1 capsule by mouth 3 (three) times daily with meals.   Yes [provider]  pantoprazole (PROTONIX) 20 MG tablet  Take 1 tablet (20 mg total) by mouth daily. 02/10/21  Yes Charlynne Cousins, MD   Current Facility-Administered Medications  Medication Dose Route Frequency Provider Last Rate Last Admin  . aspirin chewable tablet 81 mg  81 mg Oral Daily Rise Patience, MD      . atorvastatin (LIPITOR) tablet 20 mg  20 mg Oral Daily Rise Patience, MD      . calcium acetate (PHOSLO) capsule 1,334 mg  1,334 mg Oral TID WC Rise Patience, MD      . Chlorhexidine Gluconate Cloth 2 % PADS 6 each  6 each Topical Q0600 Rise Patience, MD      . dextrose 10 % infusion   Intravenous Continuous Rise Patience, MD 50 mL/hr at 04/16/21 0400 Infusion Verify at 04/16/21 0400  . donepezil (ARICEPT) tablet 10 mg  10 mg Oral Daily Rise Patience, MD      . feeding supplement (NEPRO CARB STEADY) liquid 237 mL  237 mL Oral BID BM Rise Patience, MD      . lipase/protease/amylase (CREON) capsule 24,000  Units  24,000 Units Oral TID WC Rise Patience, MD      . Derrill Memo ON 04/17/2021] midodrine (PROAMATINE) tablet 5 mg  5 mg Oral Q T,Th,S,Su Rise Patience, MD      . mupirocin ointment (BACTROBAN) 2 % 1 application  1 application Nasal BID Rise Patience, MD      . pantoprazole (PROTONIX) EC tablet 40 mg  40 mg Oral Daily Rise Patience, MD      . sevelamer carbonate (RENVELA) tablet 800 mg  800 mg Oral TID WC Rise Patience, MD       Labs: Basic Metabolic Panel: Recent Labs  Lab 04/15/21 1802  NA 135  K 5.4*  CL 98  CO2 24  GLUCOSE 78  BUN 30*  CREATININE 6.59*  CALCIUM 6.6*   Liver Function Tests: Recent Labs  Lab 04/15/21 1802  AST 30  ALT 34  ALKPHOS 91  BILITOT 1.6*  PROT 6.8  ALBUMIN 2.9*   CBC: Recent Labs  Lab 04/15/21 1802  WBC 7.6  HGB 13.6  HCT 44.1  MCV 101.1*  PLT 175   CBG: Recent Labs  Lab 04/15/21 2237 04/15/21 2258 04/16/21 0231 04/16/21 0308 04/16/21 0358  GLUCAP 231* 146* 72 66* 38*    Studies/Results: CT HEAD WO CONTRAST  Result Date: 04/16/2021 CLINICAL DATA:  Fall from bed to floor, weakness for 4 days EXAM: CT HEAD WITHOUT CONTRAST TECHNIQUE: Contiguous axial images were obtained from the base of the skull through the vertex without intravenous contrast. COMPARISON:  None. FINDINGS: Brain: No evidence of acute infarction, hemorrhage, hydrocephalus, extra-axial collection, visible mass lesion or mass effect. Symmetric prominence of the ventricles, cisterns and sulci compatible with mild senescent parenchymal volume loss with additional patchy areas of white matter hypoattenuation which could reflect mild chronic microvascular angio pathic change, not significantly age advanced. Vascular: Atherosclerotic calcification of the carotid siphons. No hyperdense vessel. Skull: Left periorbital and frontal scalp swelling/thickening without large hematoma. No subjacent calvarial fracture or visible facial bone fracture within the included margins of imaging. Hyperostotic changes calvaria. Sinuses/Orbits: Diffuse mild mural thickening with some scattered pneumatized secretions in the right maxillary sinus and layering air-fluid levels in the left maxillary sinus and left mastoid air cells with hyperostotic features which likely reflect some chronicity of these changes. No retro septal gas, stranding or hemorrhage. Prior bilateral lens extractions. No acute orbital abnormalities. Other: Absence of numerous dentition with multitude of carious lesions and periapical lucencies in the visible maxillary dentition which remains. IMPRESSION: 1. No acute intracranial abnormality. 2. Left periorbital and frontal scalp swelling/thickening without large hematoma. No subjacent calvarial fracture or visible facial bone fracture within the included margins of imaging. 3. Mild senescent parenchymal volume loss and chronic microvascular ischemic changes, not significantly age advanced. 4. Pneumatized secretions and  layering air-fluid levels most pronounced in the left maxillary sinus with a left mastoid effusion. Hyperostotic features could reflect some underlying acuity. Correlate for clinical features of acute sinusitis/mastoiditis. 5. Absence of numerous dentition with multitude of carious lesions and periapical lucencies in the visible maxillary dentition. Correlate with dental exam. Electronically Signed   By: Lovena Le M.D.   On: 04/16/2021 00:04   DG Chest Port 1 View  Result Date: 04/15/2021 CLINICAL DATA:  Weakness, hypertension EXAM: PORTABLE CHEST 1 VIEW COMPARISON:  02/07/2021 FINDINGS: Lungs are clear. No pneumothorax or pleural effusion. Cardiac size within normal limits. Left internal jugular hemodialysis catheter tips noted within the right atrium. Vascular stents  noted within the right axilla. Cervical fusion hardware noted. No acute bone abnormality IMPRESSION: No active disease. Electronically Signed   By: Fidela Salisbury MD   On: 04/15/2021 19:01    Dialysis Orders:  Center: [facility] on TTS. 180NRe, 4 hours, BFR 400, DFR 500, EDW 69kg, 2K, 3.5Ca, TDC, Heparin 4000 unit bolus Last post HD weight 71.6kg on 4/16 Calcitriol 1 mcg PO q HD No ESA Binder: Phoslo/Renvela?- no binder per outpatient records  Assessment/Plan: 1.  Hypoglycemia: Due to poor PO intake and continuing to take insulin. Currently on D10 infusion. Also reporting nausea and vomiting, viral gastroenteritis suspected. Blood cultures NGTD. Will need to consider TDC line holiday if blood cultures are positive.  2.  ESRD:  Typically dialyzes on TTS schedule. K+ 5.4. UF limited last HD due to hypotension and patient is developing symptoms of volume overload, see below. Will plan for extra abbreviated HD today, resume TTS schedule tomorrow.  3.  Hypertension/volume: CXR clear however patient feels increasingly short of breath over the past several days and is currently 6.4kg over his estimated dry weight.  Has required several fluid boluses due to sot BP. Takes midodrine 69m pre-HD, will continue here.  4.  Anemia: Hemoglobin 13.6. No ESA indicated.  5.  Metabolic bone disease: Corrected calcium 7.5. Continue 3.5Ca bath and VDRA. Phoslo and renvela ordered here but appears this was stopped at some point outpatient, last phos was 5.0 on 4/14. Will continue phoslo for now and trend phos level. Stopping renvela.  6.  Nutrition:  Albumin low, poor PO intake recently. Continue protein supplement.   SAnice Paganini PA-C 04/16/2021, 8:44 AM  [facility] Pager: [phone]

## 2021-04-16 NOTE — Plan of Care (Signed)
  Problem: Education: Goal: Knowledge of General Education information will improve Description: Including pain rating scale, medication(s)/side effects and non-pharmacologic comfort measures Outcome: Progressing   Problem: Health Behavior/Discharge Planning: Goal: Ability to manage health-related needs will improve Outcome: Progressing   Problem: Clinical Measurements: Goal: Ability to maintain clinical measurements within normal limits will improve Outcome: Progressing Goal: Diagnostic test results will improve Outcome: Progressing   Problem: Nutrition: Goal: Adequate nutrition will be maintained Outcome: Progressing   Problem: Elimination: Goal: Will not experience complications related to bowel motility Outcome: Progressing

## 2021-04-16 NOTE — Progress Notes (Signed)
PROGRESS NOTE    Jose Robinson  [id number] DOB: [birth year] DOA: 04/15/2021 PCP: Pcp, No     Brief Narrative:  Jose Robinson is a 65 y.o. 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  EMS was called patient blood sugar was found in the 70s.  Patient states over the last 3 days patient has been having nausea vomiting and diarrhea.  Per patient by other people in the facility having similar complaints.  Patient state despite not eating he was still taking his insulin.  ED Course: In the ER patient blood sugar has dropped to the 40s had to be started on D10.  Labs show mild hyperkalemia of 5.4 EKG shows sinus rhythm Covid test was negative.  Chest x-ray unremarkable.  Patient admitted for hypoglycemia.  CT head was unremarkable for any acute.  On-call nephrologist was consulted by the ER physician.    Subjective: Afebrile overnight, A/O x4.  Patient states that he has had a cold and been anorexic for the past few days however the staff at his facility continue to give him the same amount of insulin which resulted in his hyperglycemic state.   Assessment & Plan: Covid vaccination; vaccinated 3/3   Principal Problem:   Hypoglycemia Active Problems:   ESRD (end stage renal disease) (HCC)   Hyperglycemia   Diabetes mellitus type 2, uncontrolled, with complications (HCC)   Diabetic nephropathy (HCC)   Chronic systolic CHF (congestive heart failure) (HCC)  Hypoglycemia - Secondary to anorexia and patient continue intake insulin - Renal/carb modified diet - Glucose tablets 8 g QID  -Spoke at length with patient that he must consume at least 3 meals a day even if they are small meals.  In addition he must demand that staff at his SNF check his CBG prior  to administering insulin, and make appropriate adjustments to his insulin dosing.  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  VENTILATOR SETTINGS: Room air 4/18 SPO2 99%   Cultures 4/17 SARS coronavirus negative 4/17 influenza A/B negative   Antimicrobials:    Devices    LINES / TUBES:      Continuous Infusions: . dextrose 50 mL/hr at 04/16/21 0400     Objective: Vitals:   04/15/21 2328 04/16/21 0115 04/16/21 0245 04/16/21 0333  BP: 101/68 110/74 125/80 106/71  Pulse:  84  76  Resp: 18 (!) '21 19 20  '$ Temp:    97.6 F (36.4 C)  TempSrc:    Oral  SpO2: 98% 99% 98% 96%  Weight:    75.4 kg  Height:    '5\' 11"'$  (1.803 m)    Intake/Output Summary (Last 24 hours) at 04/16/2021 [id number] Last data filed at 04/16/2021 0400 Gross per 24 hour  Intake 508.48 ml  Output --  Net 508.48 ml   Filed Weights   04/16/21 0333  Weight: 75.4 kg    Examination:  General: A/O x4 No acute respiratory distress Eyes: negative scleral hemorrhage, negative anisocoria, negative icterus ENT: Negative Runny nose, negative gingival bleeding, Neck:  Negative scars, masses, torticollis, lymphadenopathy, JVD Lungs: Clear to auscultation bilaterally without  wheezes or crackles Cardiovascular: Regular rate and rhythm without murmur gallop or rub normal S1 and S2 Abdomen: negative abdominal pain, nondistended, positive soft, bowel sounds, no rebound, no ascites, no appreciable mass Extremities: No significant cyanosis, clubbing, or edema bilateral lower extremities Skin: Negative rashes, lesions, ulcers Psychiatric:  Negative depression, negative anxiety, negative fatigue, negative mania  Central nervous system:  Cranial nerves II through XII intact, tongue/uvula midline, all extremities muscle strength 5/5, sensation intact throughout, negative dysarthria, negative expressive aphasia, negative receptive aphasia.  .     Data Reviewed: Care during the described time interval was provided by me .  I have reviewed this patient's available data, including medical history, events of note, physical examination, and all test results as part of my evaluation.  CBC: Recent Labs  Lab 04/15/21 1802  WBC 7.6  HGB 13.6  HCT 44.1  MCV 101.1*  PLT 0000000   Basic Metabolic Panel: Recent Labs  Lab 04/15/21 1802  NA 135  K 5.4*  CL 98  CO2 24  GLUCOSE 78  BUN 30*  CREATININE 6.59*  CALCIUM 6.6*   GFR: Estimated Creatinine Clearance: 12.4 mL/min (A) (by C-G formula based on SCr of 6.59 mg/dL (H)). Liver Function Tests: Recent Labs  Lab 04/15/21 1802  AST 30  ALT 34  ALKPHOS 91  BILITOT 1.6*  PROT 6.8  ALBUMIN 2.9*   No results for input(s): LIPASE, AMYLASE in the last 168 hours. No results for input(s): AMMONIA in the last 168 hours. Coagulation Profile: No results for input(s): INR, PROTIME in the last 168 hours. Cardiac Enzymes: No results for input(s): CKTOTAL, CKMB, CKMBINDEX, TROPONINI in the last 168 hours. BNP (last 3 results) No results for input(s): PROBNP in the last 8760 hours. HbA1C: No results for input(s): HGBA1C in the last 72 hours. CBG: Recent Labs  Lab 04/15/21 2237 04/15/21 2258 04/16/21 0231 04/16/21 0308  04/16/21 0358  GLUCAP 231* 146* 72 66* 66*   Lipid Profile: No results for input(s): CHOL, HDL, LDLCALC, TRIG, CHOLHDL, LDLDIRECT in the last 72 hours. Thyroid Function Tests: No results for input(s): TSH, T4TOTAL, FREET4, T3FREE, THYROIDAB in the last 72 hours. Anemia Panel: No results for input(s): VITAMINB12, FOLATE, FERRITIN, TIBC, IRON, RETICCTPCT in the last 72 hours. Sepsis Labs: Recent Labs  Lab 04/15/21 1804 04/15/21 2300  LATICACIDVEN 1.1 0.8    Recent Results (from the past 240 hour(s))  Blood culture (routine x 2)     Status: None (Preliminary result)   Collection Time: 04/15/21  7:30 PM   Specimen: BLOOD  Result Value Ref Range Status   Specimen Description BLOOD RIGHT ANTECUBITAL  Final   Special Requests   Final    BOTTLES DRAWN AEROBIC  AND ANAEROBIC Blood Culture adequate volume   Culture   Final    NO GROWTH < 12 HOURS Performed at [facility] [address], [place], [place] [address]    Report Status PENDING  Incomplete  Resp Panel by RT-PCR (Flu A&B, Covid) Nasopharyngeal Swab     Status: None   Collection Time: 04/15/21 10:21 PM   Specimen: Nasopharyngeal Swab; Nasopharyngeal(NP) swabs in vial transport medium  Result Value Ref Range Status   SARS Coronavirus 2 by RT PCR NEGATIVE NEGATIVE Final    Comment: (NOTE) SARS-CoV-2 target nucleic acids are NOT DETECTED.  The SARS-CoV-2 RNA is generally detectable in upper respiratory specimens during the acute phase of infection. The lowest concentration of SARS-CoV-2 viral copies this assay can detect is 138 copies/mL. A negative result does not preclude SARS-Cov-2 infection and should not be used as the sole basis for treatment or other patient management decisions. A negative result may occur with  improper specimen collection/handling, submission of specimen other than nasopharyngeal swab, presence of viral mutation(s) within the areas targeted by this assay, and inadequate number of  viral copies(<138 copies/mL). A negative result must be combined with clinical observations, patient history, and epidemiological information. The expected result is Negative.  Fact Sheet for Patients:  EntrepreneurPulse.com.au  Fact Sheet for Healthcare Providers:  IncredibleEmployment.be  This test is no t yet approved or cleared by the [place] FDA and  has been authorized for detection and/or diagnosis of SARS-CoV-2 by FDA under an Emergency Use Authorization (EUA). This EUA will remain  in effect (meaning this test can be used) for the duration of the COVID-19 declaration under Section 564(b)(1) of the Act, 21 U.S.C.section 360bbb-3(b)(1), unless the authorization is terminated  or revoked sooner.       Influenza A by PCR NEGATIVE NEGATIVE Final   Influenza B by PCR NEGATIVE NEGATIVE Final    Comment: (NOTE) The Xpert Xpress SARS-CoV-2/FLU/RSV plus assay is intended as an aid in the diagnosis of influenza from Nasopharyngeal swab specimens and should not be used as a sole basis for treatment. Nasal washings and aspirates are unacceptable for Xpert Xpress SARS-CoV-2/FLU/RSV testing.  Fact Sheet for Patients: EntrepreneurPulse.com.au  Fact Sheet for Healthcare Providers: IncredibleEmployment.be  This test is not yet approved or cleared by the [place] FDA and has been authorized for detection and/or diagnosis of SARS-CoV-2 by FDA under an Emergency Use Authorization (EUA). This EUA will remain in effect (meaning this test can be used) for the duration of the COVID-19 declaration under Section 564(b)(1) of the Act, 21 U.S.C. section 360bbb-3(b)(1), unless the authorization is terminated or revoked.  Performed at [facility], [place] [address] [place], [place] [address]   MRSA PCR Screening     Status: Abnormal   Collection Time: 04/16/21  3:39 AM   Specimen: Nasal Mucosa;  Nasopharyngeal  Result Value Ref Range Status   MRSA by PCR POSITIVE (A) NEGATIVE Final    Comment:        The GeneXpert MRSA Assay (FDA approved for NASAL specimens only), is one component of a comprehensive MRSA colonization surveillance program. It is not intended to diagnose MRSA infection nor to guide or monitor treatment for MRSA infections. RESULT CALLED TO, READ BACK BY AND VERIFIED WITH: KIRBY,M RN 04/16/2021 AT [id number] SKEEN,P Performed at [facility], [place] [address] [place] [address], [place], [place] [address]          Radiology Studies: CT HEAD WO CONTRAST  Result Date:  04/16/2021 CLINICAL DATA:  Fall from bed to floor, weakness for 4 days EXAM: CT HEAD WITHOUT CONTRAST TECHNIQUE: Contiguous axial images were obtained from the base of the skull through the vertex without intravenous contrast. COMPARISON:  None. FINDINGS: Brain: No evidence of acute infarction, hemorrhage, hydrocephalus, extra-axial collection, visible mass lesion or mass effect. Symmetric prominence of the ventricles, cisterns and sulci compatible with mild senescent parenchymal volume loss with additional patchy areas of white matter hypoattenuation which could reflect mild chronic microvascular angio pathic change, not significantly age advanced. Vascular: Atherosclerotic calcification of the carotid siphons. No hyperdense vessel. Skull: Left periorbital and frontal scalp swelling/thickening without large hematoma. No subjacent calvarial fracture or visible facial bone fracture within the included margins of imaging. Hyperostotic changes calvaria. Sinuses/Orbits: Diffuse mild mural thickening with some scattered pneumatized secretions in the right maxillary sinus and layering air-fluid levels in the left maxillary sinus and left mastoid air cells with hyperostotic features which likely reflect some chronicity of these changes. No retro septal gas, stranding or hemorrhage. Prior bilateral lens extractions. No acute orbital  abnormalities. Other: Absence of numerous dentition with multitude of carious lesions and periapical lucencies in the visible maxillary dentition which remains. IMPRESSION: 1. No acute intracranial abnormality. 2. Left periorbital and frontal scalp swelling/thickening without large hematoma. No subjacent calvarial fracture or visible facial bone fracture within the included margins of imaging. 3. Mild senescent parenchymal volume loss and chronic microvascular ischemic changes, not significantly age advanced. 4. Pneumatized secretions and layering air-fluid levels most pronounced in the left maxillary sinus with a left mastoid effusion. Hyperostotic features could reflect some underlying acuity. Correlate for clinical features of acute sinusitis/mastoiditis. 5. Absence of numerous dentition with multitude of carious lesions and periapical lucencies in the visible maxillary dentition. Correlate with dental exam. Electronically Signed   By: Lovena Le M.D.   On: 04/16/2021 00:04   DG Chest Port 1 View  Result Date: 04/15/2021 CLINICAL DATA:  Weakness, hypertension EXAM: PORTABLE CHEST 1 VIEW COMPARISON:  02/07/2021 FINDINGS: Lungs are clear. No pneumothorax or pleural effusion. Cardiac size within normal limits. Left internal jugular hemodialysis catheter tips noted within the right atrium. Vascular stents noted within the right axilla. Cervical fusion hardware noted. No acute bone abnormality IMPRESSION: No active disease. Electronically Signed   By: Fidela Salisbury MD   On: 04/15/2021 19:01        Scheduled Meds: . aspirin  81 mg Oral Daily  . atorvastatin  20 mg Oral Daily  . calcium acetate  1,334 mg Oral TID WC  . Chlorhexidine Gluconate Cloth  6 each Topical Q0600  . donepezil  10 mg Oral Daily  . feeding supplement (NEPRO CARB STEADY)  237 mL Oral BID BM  . lipase/protease/amylase  24,000 Units Oral TID WC  . [START ON 04/17/2021] midodrine  5 mg Oral Q T,Th,S,Su  . mupirocin ointment  1  application Nasal BID  . pantoprazole  40 mg Oral Daily  . sevelamer carbonate  800 mg Oral TID WC   Continuous Infusions: . dextrose 50 mL/hr at 04/16/21 0400     LOS: 0 days    Time spent:40 min    Ottis Sarnowski, Geraldo Docker, MD [facility]   If 7PM-7AM, please contact night-coverage 04/16/2021, 8:42 AM

## 2021-04-16 NOTE — Procedures (Signed)
   I was present at this dialysis session, have reviewed the session itself and made  appropriate changes Kelly Splinter MD [place] pager [phone]   04/16/2021, 5:26 PM

## 2021-04-16 NOTE — Progress Notes (Signed)
Initial Nutrition Assessment  DOCUMENTATION CODES:   Not applicable  INTERVENTION:   -D/c Nepro shake -30 ml Prosource Plus TID, each supplement provides 100 kcals and 15 grams protein -Renal MVI daily  NUTRITION DIAGNOSIS:   Increased nutrient needs related to chronic illness (ESRD on HD) as evidenced by estimated needs.  GOAL:   Patient will meet greater than or equal to 90% of their needs  MONITOR:   PO intake,Supplement acceptance,Diet advancement,Labs,Weight trends,Skin,I & O's  REASON FOR ASSESSMENT:   Malnutrition Screening Tool    ASSESSMENT:   Jose Robinson is a 66 y.o. male with history of ESRD on hemodialysis on Tuesday Thursdays and Saturday who has had dialysis yesterday presents to the ER after patient had a fall while try to get out of the bed and was found to be confused.  EMS was called patient blood sugar was found in the 70s.  Patient states over the last 3 days patient has been having nausea vomiting and diarrhea.  Per patient by other people in the facility having similar complaints.  Patient state despite not eating he was still taking his insulin.  Pt admitted with hypoglycemia.   Reviewed I/O's: +509 ml x 24 hours  Attempted to speak with pt x 2, however, unavailable at times of both visits.   Per H&P, pt with decreased oral intake PTA. Meal completion 50%. He is refusing Nepro supplements.  Reviewed wt hx; wt has been stable. Per nephrology notes, EDW 71.6 kg. Pt above dry weight.   Medications reviewed and include phoslo, creon, and lokelma.   Lab Results  Component Value Date   HGBA1C 10.4 (H) 01/24/2021   PTA DM medications are 3 units insulin aspart TID and 1.5 mg duaglutide weekly.   Labs reviewed:K: 6.4, Phos: 5.1, CBGS: 69-81 (inpatient orders for glycemic control are none).   Diet Order:   Diet Order            Diet renal/carb modified with fluid restriction Diet-HS Snack? Nothing; Fluid restriction: 1200 mL Fluid; Room service  appropriate? Yes; Fluid consistency: Thin  Diet effective now                 EDUCATION NEEDS:   No education needs have been identified at this time  Skin:  Skin Assessment: Reviewed RN Assessment  Last BM:  04/16/21  Height:   Ht Readings from Last 1 Encounters:  04/16/21 '5\' 11"'$  (1.803 m)    Weight:   Wt Readings from Last 1 Encounters:  04/16/21 77.3 kg    Ideal Body Weight:  78.2 kg  BMI:  Body mass index is 23.77 kg/m.  Estimated Nutritional Needs:   Kcal:  2150-2350  Protein:  105-120 grams  Fluid:  1000 ml +UOP    Loistine Chance, RD, LDN, [place] Registered Dietitian II Certified Diabetes Care and Education Specialist Please refer to [facility] for RD and/or RD on-call/weekend/after hours pager

## 2021-04-17 ENCOUNTER — Other Ambulatory Visit (HOSPITAL_COMMUNITY): Payer: Self-pay

## 2021-04-17 DIAGNOSIS — E118 Type 2 diabetes mellitus with unspecified complications: Secondary | ICD-10-CM | POA: Diagnosis not present

## 2021-04-17 DIAGNOSIS — R4182 Altered mental status, unspecified: Secondary | ICD-10-CM | POA: Diagnosis not present

## 2021-04-17 DIAGNOSIS — E162 Hypoglycemia, unspecified: Secondary | ICD-10-CM | POA: Diagnosis not present

## 2021-04-17 DIAGNOSIS — I5022 Chronic systolic (congestive) heart failure: Secondary | ICD-10-CM | POA: Diagnosis not present

## 2021-04-17 DIAGNOSIS — E11649 Type 2 diabetes mellitus with hypoglycemia without coma: Secondary | ICD-10-CM | POA: Diagnosis not present

## 2021-04-17 LAB — CBC
HCT: 41.7 % (ref 39.0–52.0)
Hemoglobin: 12.7 g/dL — ABNORMAL LOW (ref 13.0–17.0)
MCH: 30.8 pg (ref 26.0–34.0)
MCHC: 30.5 g/dL (ref 30.0–36.0)
MCV: 101.2 fL — ABNORMAL HIGH (ref 80.0–100.0)
Platelets: 175 10*3/uL (ref 150–400)
RBC: 4.12 MIL/uL — ABNORMAL LOW (ref 4.22–5.81)
RDW: 14.5 % (ref 11.5–15.5)
WBC: 4.6 10*3/uL (ref 4.0–10.5)
nRBC: 0 % (ref 0.0–0.2)

## 2021-04-17 LAB — RENAL FUNCTION PANEL
Albumin: 2.7 g/dL — ABNORMAL LOW (ref 3.5–5.0)
Anion gap: 13 (ref 5–15)
BUN: 34 mg/dL — ABNORMAL HIGH (ref 8–23)
CO2: 26 mmol/L (ref 22–32)
Calcium: 7.2 mg/dL — ABNORMAL LOW (ref 8.9–10.3)
Chloride: 97 mmol/L — ABNORMAL LOW (ref 98–111)
Creatinine, Ser: 6.19 mg/dL — ABNORMAL HIGH (ref 0.61–1.24)
GFR, Estimated: 10 mL/min — ABNORMAL LOW (ref >60)
Glucose, Bld: 119 mg/dL — ABNORMAL HIGH (ref 70–99)
Phosphorus: 3.7 mg/dL (ref 2.5–4.6)
Potassium: 5 mmol/L (ref 3.5–5.1)
Sodium: 136 mmol/L (ref 135–145)

## 2021-04-17 LAB — GLUCOSE, CAPILLARY
Glucose-Capillary: 112 mg/dL — ABNORMAL HIGH (ref 70–99)
Glucose-Capillary: 165 mg/dL — ABNORMAL HIGH (ref 70–99)
Glucose-Capillary: 212 mg/dL — ABNORMAL HIGH (ref 70–99)

## 2021-04-17 MED ORDER — PANTOPRAZOLE SODIUM 40 MG PO TBEC
40.0000 mg | DELAYED_RELEASE_TABLET | Freq: Every day | ORAL | 0 refills | Status: AC
  Filled 2021-04-17: qty 30, 30d supply, fill #0

## 2021-04-17 MED ORDER — GUAIFENESIN-DM 100-10 MG/5ML PO SYRP
10.0000 mL | ORAL_SOLUTION | ORAL | 0 refills | Status: AC | PRN
  Filled 2021-04-17: qty 118, 2d supply, fill #0

## 2021-04-17 MED ORDER — CALCIUM ACETATE (PHOS BINDER) 667 MG PO CAPS
1334.0000 mg | ORAL_CAPSULE | Freq: Three times a day (TID) | ORAL | 0 refills | Status: AC
  Filled 2021-04-17: qty 180, 30d supply, fill #0

## 2021-04-17 MED ORDER — LIDOCAINE 5 % EX PTCH
1.0000 | MEDICATED_PATCH | CUTANEOUS | 0 refills | Status: DC
Start: 2021-04-17 — End: 2022-02-04
  Filled 2021-04-17: qty 30, 30d supply, fill #0

## 2021-04-17 MED ORDER — CALCITRIOL 0.5 MCG PO CAPS
1.0000 ug | ORAL_CAPSULE | ORAL | 0 refills | Status: AC
  Filled 2021-04-17: qty 28, 32d supply, fill #0

## 2021-04-17 MED ORDER — HEPARIN SODIUM (PORCINE) 1000 UNIT/ML IJ SOLN
INTRAMUSCULAR | Status: AC
  Administered 2021-04-17: 3800 [IU]
  Filled 2021-04-17: qty 4

## 2021-04-17 MED ORDER — NOVOLOG FLEXPEN 100 UNIT/ML ~~LOC~~ SOPN
3.0000 [IU] | PEN_INJECTOR | Freq: Three times a day (TID) | SUBCUTANEOUS | 11 refills | Status: AC | PRN
  Filled 2021-04-17: qty 15, 167d supply, fill #0

## 2021-04-17 MED ORDER — GLUCOSE 4 G PO CHEW
2.0000 | CHEWABLE_TABLET | Freq: Four times a day (QID) | ORAL | 0 refills | Status: AC | PRN
  Filled 2021-04-17: qty 50, 7d supply, fill #0

## 2021-04-17 MED ORDER — MIDODRINE HCL 5 MG PO TABS
5.0000 mg | ORAL_TABLET | ORAL | 0 refills | Status: DC
  Filled 2021-04-17: qty 16, 28d supply, fill #0

## 2021-04-17 NOTE — Plan of Care (Signed)
  Problem: Education: Goal: Knowledge of General Education information will improve Description: Including pain rating scale, medication(s)/side effects and non-pharmacologic comfort measures Outcome: Progressing   Problem: Clinical Measurements: Goal: Ability to maintain clinical measurements within normal limits will improve Outcome: Progressing   Problem: Clinical Measurements: Goal: Will remain free from infection Outcome: Progressing   Problem: Clinical Measurements: Goal: Diagnostic test results will improve Outcome: Progressing   Problem: Nutrition: Goal: Adequate nutrition will be maintained Outcome: Progressing   Problem: Pain Managment: Goal: General experience of comfort will improve Outcome: Progressing   Problem: Skin Integrity: Goal: Risk for impaired skin integrity will decrease Outcome: Progressing

## 2021-04-17 NOTE — Discharge Summary (Signed)
Physician Discharge Summary  Jose Robinson MVE:[id number] DOB: 1956/09/25 DOA: 04/15/2021  PCP: Pcp, No  Admit date: 04/15/2021 Discharge date: 04/17/2021  Time spent: 35 minutes  Recommendations for Outpatient Follow-up:    Covid vaccination; vaccinated 3/3   Hypoglycemia - Secondary to anorexia and patient continue intake insulin - Renal/carb modified diet - Glucose tablets 8 g QID  -Spoke at length with patient that he must consume at least 3 meals a day even if they are small meals.  In addition he must demand that staff at his SNF check his CBG prior to administering insulin, and make appropriate adjustments to his insulin dosing. -Resolved  DM type II uncontrolled with complication - 2/83 Hemoglobin A1c= 10.4 -  Currently controlled without diabetic medication - Will have to monitor CBG qac & qhs and make adjustments on his insulin to stay within guidelines. - Trulicity 1.5 mg every Thursday - NovoLog 3 units qac PRN CBG>180 - Lantus 20 units daily (do not discharge on) would allow PCP to restart when/if patient requires given that his hypoglycemia so severe that it required hospitalization.  Nausea/Vomiting/Diarrhea - Resolved  Cough - Robitussin-DM 63m PRN cough  ESRD on HD T/TH/SAT  - HD per nephrology - 4/18 s/p HD   Hypotension chronic - Midodrine 5 mg T/TH/S/SU  Hyperkalemia -Lokelma 10 g x 1 - HD yesterday/today.  May discharge after HD   Discharge Diagnoses:  Principal Problem:   Hypoglycemia Active Problems:   ESRD (end stage renal disease) ([facility]   Hyperglycemia   Diabetes mellitus type 2, uncontrolled, with complications (HCC)   Diabetic nephropathy (HCC)   Chronic systolic CHF (congestive heart failure) ([facility]   Discharge Condition: Stable  Diet recommendation: Carb modified  Filed Weights   04/16/21 1042 04/16/21 1401 04/17/21 0814  Weight: 78.2 kg 77.3 kg 74.6 kg    History of present illness:  Jose O Youngis a 65y.o.BM  PMHx chronic systolic CHF (EF 40 to 466%01/24/2021) HTN, DM type II uncontrolled with complication, DM nephropathy, ESRD on HD T/TH/SAT who has had dialysis yesterday   Presents to the ER after patient had a fall while try to get out of the bed and was found to be confused. EMS was called patient blood sugar was found in the 70s. Patient states over the last 3 days patient has been having nausea vomiting and diarrhea. Per patient by other people in the facility having similar complaints. Patient state despite not eating he was still taking his insulin.  ED Course:In the ER patient blood sugar has dropped to the 40s had to be started on D10. Labs show mild hyperkalemia of 5.4 EKG shows sinus rhythm Covid test was negative. Chest x-ray unremarkable. Patient admitted for hypoglycemia. CT head was unremarkable for any acute. On-call nephrologist was consulted by the ER physician.  Hospital Course:  See above    Consultations: Nephrology  Cultures  4/17 SARS coronavirus negative 4/17 influenza A/B negative    Discharge Exam: Vitals:   04/17/21 0400 04/17/21 0814 04/17/21 0819 04/17/21 0830  BP: 107/74 134/82 130/79 126/74  Pulse: 81     Resp: 16 (!) 21 19 (!) 71  Temp: 97.8 F (36.6 C)     TempSrc: Oral     SpO2: 95% 94%    Weight:  74.6 kg    Height:       General: A/O x4 No acute respiratory distress Eyes: negative scleral hemorrhage, negative anisocoria, negative icterus ENT: Negative Runny nose, negative gingival bleeding,  Neck:  Negative scars, masses, torticollis, lymphadenopathy, JVD Lungs: Clear to auscultation bilaterally without wheezes or crackles Cardiovascular: Regular rate and rhythm without murmur gallop or rub normal S1 and S2   Discharge Instructions   Allergies as of 04/17/2021      Reactions   Acetaminophen Palpitations   Prednisone Other (See Comments)   Severe hallucinations/paranoid delusions after steroid premedications for contrast allergy,  required IV haldol and restraints.   Ivp Dye [iodinated Diagnostic Agents] Other (See Comments)      Medication List    STOP taking these medications   glucagon 1 MG injection   LANTUS SOLOSTAR [name]   omeprazole 20 MG capsule Commonly known as: PRILOSEC Replaced by: pantoprazole 40 MG tablet   pantoprazole 20 MG tablet Commonly known as: PROTONIX     TAKE these medications   aspirin 81 MG chewable tablet Chew 1 tablet (81 mg total) by mouth daily.   atorvastatin 20 MG tablet Commonly known as: LIPITOR Take 1 tablet (20 mg total) by mouth daily.   blood glucose meter kit and supplies Kit Dispense based on patient and insurance preference. Use up to four times daily as directed. (FOR ICD-9 250.00, 250.01).   calcitRIOL 0.5 MCG capsule Commonly known as: ROCALTROL Take 2 capsules (1 mcg total) by mouth Every Tuesday,Thursday,and Saturday with dialysis.   calcium acetate 667 MG capsule Commonly known as: PHOSLO Take 2 capsules (1,334 mg total) by mouth 3 (three) times daily with meals.   calcium carbonate 500 MG chewable tablet Commonly known as: TUMS - dosed in mg elemental calcium Chew 2 tablets by mouth daily as needed (gas).   donepezil 10 MG tablet Commonly known as: ARICEPT Take 10 mg by mouth daily.   gabapentin 100 MG capsule Commonly known as: NEURONTIN Take 200 mg by mouth 3 (three) times daily.   glucose 4 GM chewable tablet Chew 2 tablets (8 g total) by mouth 4 (four) times daily as needed for low blood sugar (for CBG<80).   guaiFENesin-dextromethorphan 100-10 MG/5ML syrup Commonly known as: ROBITUSSIN DM Take 10 mLs by mouth every 4 (four) hours as needed for cough (chest congestion).   lidocaine 5 % Commonly known as: LIDODERM Place 1 patch onto the skin daily. Remove & Discard patch within 12 hours or as directed by MD   loperamide 2 MG tablet Commonly known as: IMODIUM A-D Take 2-4 mg by mouth See admin instructions. 4 mg after 1st loose stool,  then 2 mg after each loose stool. Max 4 tabs in 24 hours   midodrine 5 MG tablet Commonly known as: PROAMATINE Take 1 tablet (5 mg total) by mouth every Tuesday, Thursday, Saturday, and Sunday. What changed:   when to take this  additional instructions   naproxen sodium 220 MG tablet Commonly known as: ALEVE Take 440 mg by mouth 2 (two) times daily as needed (pain/headache).   NovoLOG FlexPen 100 UNIT/ML FlexPen Generic drug: insulin aspart Inject 3 Units into the skin 3 (three) times daily with meals as needed for high blood sugar (CBG>180). What changed:   when to take this  reasons to take this   ondansetron 4 MG tablet Commonly known as: ZOFRAN Take 4 mg by mouth every 8 (eight) hours as needed for nausea or vomiting.   pantoprazole 40 MG tablet Commonly known as: PROTONIX Take 1 tablet (40 mg total) by mouth daily. Replaces: omeprazole 20 MG capsule   Trulicity 1.5 PO/2.4MP Sopn Generic drug: Dulaglutide Inject 1.5 mg into the skin  every Thursday.   Zenpep 5000-24000 units Cpep Generic drug: Pancrelipase (Lip-Prot-Amyl) Take 1 capsule by mouth 3 (three) times daily with meals.      Allergies  Allergen Reactions  . Acetaminophen Palpitations  . Prednisone Other (See Comments)    Severe hallucinations/paranoid delusions after steroid premedications for contrast allergy, required IV haldol and restraints.   [name] [Iodinated Diagnostic Agents] Other (See Comments)      The results of significant diagnostics from this hospitalization (including imaging, microbiology, ancillary and laboratory) are listed below for reference.    Significant Diagnostic Studies: CT HEAD WO CONTRAST  Result Date: 04/16/2021 CLINICAL DATA:  Fall from bed to floor, weakness for 4 days EXAM: CT HEAD WITHOUT CONTRAST TECHNIQUE: Contiguous axial images were obtained from the base of the skull through the vertex without intravenous contrast. COMPARISON:  None. FINDINGS: Brain: No  evidence of acute infarction, hemorrhage, hydrocephalus, extra-axial collection, visible mass lesion or mass effect. Symmetric prominence of the ventricles, cisterns and sulci compatible with mild senescent parenchymal volume loss with additional patchy areas of white matter hypoattenuation which could reflect mild chronic microvascular angio pathic change, not significantly age advanced. Vascular: Atherosclerotic calcification of the carotid siphons. No hyperdense vessel. Skull: Left periorbital and frontal scalp swelling/thickening without large hematoma. No subjacent calvarial fracture or visible facial bone fracture within the included margins of imaging. Hyperostotic changes calvaria. Sinuses/Orbits: Diffuse mild mural thickening with some scattered pneumatized secretions in the right maxillary sinus and layering air-fluid levels in the left maxillary sinus and left mastoid air cells with hyperostotic features which likely reflect some chronicity of these changes. No retro septal gas, stranding or hemorrhage. Prior bilateral lens extractions. No acute orbital abnormalities. Other: Absence of numerous dentition with multitude of carious lesions and periapical lucencies in the visible maxillary dentition which remains. IMPRESSION: 1. No acute intracranial abnormality. 2. Left periorbital and frontal scalp swelling/thickening without large hematoma. No subjacent calvarial fracture or visible facial bone fracture within the included margins of imaging. 3. Mild senescent parenchymal volume loss and chronic microvascular ischemic changes, not significantly age advanced. 4. Pneumatized secretions and layering air-fluid levels most pronounced in the left maxillary sinus with a left mastoid effusion. Hyperostotic features could reflect some underlying acuity. Correlate for clinical features of acute sinusitis/mastoiditis. 5. Absence of numerous dentition with multitude of carious lesions and periapical lucencies in the  visible maxillary dentition. Correlate with dental exam. Electronically Signed   By: Lovena Le M.D.   On: 04/16/2021 00:04   DG Chest Port 1 View  Result Date: 04/15/2021 CLINICAL DATA:  Weakness, hypertension EXAM: PORTABLE CHEST 1 VIEW COMPARISON:  02/07/2021 FINDINGS: Lungs are clear. No pneumothorax or pleural effusion. Cardiac size within normal limits. Left internal jugular hemodialysis catheter tips noted within the right atrium. Vascular stents noted within the right axilla. Cervical fusion hardware noted. No acute bone abnormality IMPRESSION: No active disease. Electronically Signed   By: Fidela Salisbury MD   On: 04/15/2021 19:01    Microbiology: Recent Results (from the past 240 hour(s))  Blood culture (routine x 2)     Status: None (Preliminary result)   Collection Time: 04/15/21  7:30 PM   Specimen: BLOOD  Result Value Ref Range Status   Specimen Description BLOOD RIGHT ANTECUBITAL  Final   Special Requests   Final    BOTTLES DRAWN AEROBIC AND ANAEROBIC Blood Culture adequate volume   Culture   Final    NO GROWTH 2 DAYS Performed at [facility]  Lab, [address], [place], [place] [address]    Report Status PENDING  Incomplete  Resp Panel by RT-PCR (Flu A&B, Covid) Nasopharyngeal Swab     Status: None   Collection Time: 04/15/21 10:21 PM   Specimen: Nasopharyngeal Swab; Nasopharyngeal(NP) swabs in vial transport medium  Result Value Ref Range Status   SARS Coronavirus 2 by RT PCR NEGATIVE NEGATIVE Final    Comment: (NOTE) SARS-CoV-2 target nucleic acids are NOT DETECTED.  The SARS-CoV-2 RNA is generally detectable in upper respiratory specimens during the acute phase of infection. The lowest concentration of SARS-CoV-2 viral copies this assay can detect is 138 copies/mL. A negative result does not preclude SARS-Cov-2 infection and should not be used as the sole basis for treatment or other patient management decisions. A negative result may occur with  improper  specimen collection/handling, submission of specimen other than nasopharyngeal swab, presence of viral mutation(s) within the areas targeted by this assay, and inadequate number of viral copies(<138 copies/mL). A negative result must be combined with clinical observations, patient history, and epidemiological information. The expected result is Negative.  Fact Sheet for Patients:  EntrepreneurPulse.com.au  Fact Sheet for Healthcare Providers:  IncredibleEmployment.be  This test is no t yet approved or cleared by the [place] FDA and  has been authorized for detection and/or diagnosis of SARS-CoV-2 by FDA under an Emergency Use Authorization (EUA). This EUA will remain  in effect (meaning this test can be used) for the duration of the COVID-19 declaration under Section 564(b)(1) of the Act, 21 U.S.C.section 360bbb-3(b)(1), unless the authorization is terminated  or revoked sooner.       Influenza A by PCR NEGATIVE NEGATIVE Final   Influenza B by PCR NEGATIVE NEGATIVE Final    Comment: (NOTE) The Xpert Xpress SARS-CoV-2/FLU/RSV plus assay is intended as an aid in the diagnosis of influenza from Nasopharyngeal swab specimens and should not be used as a sole basis for treatment. Nasal washings and aspirates are unacceptable for Xpert Xpress SARS-CoV-2/FLU/RSV testing.  Fact Sheet for Patients: EntrepreneurPulse.com.au  Fact Sheet for Healthcare Providers: IncredibleEmployment.be  This test is not yet approved or cleared by the [place] FDA and has been authorized for detection and/or diagnosis of SARS-CoV-2 by FDA under an Emergency Use Authorization (EUA). This EUA will remain in effect (meaning this test can be used) for the duration of the COVID-19 declaration under Section 564(b)(1) of the Act, 21 U.S.C. section 360bbb-3(b)(1), unless the authorization is terminated or revoked.  Performed at  [facility], [place] [address], [place], [place] [address]   MRSA PCR Screening     Status: Abnormal   Collection Time: 04/16/21  3:39 AM   Specimen: Nasal Mucosa; Nasopharyngeal  Result Value Ref Range Status   MRSA by PCR POSITIVE (A) NEGATIVE Final    Comment:        The GeneXpert MRSA Assay (FDA approved for NASAL specimens only), is one component of a comprehensive MRSA colonization surveillance program. It is not intended to diagnose MRSA infection nor to guide or monitor treatment for MRSA infections. RESULT CALLED TO, READ BACK BY AND VERIFIED WITH: KIRBY,M RN 04/16/2021 AT 1224 SKEEN,P Performed at [facility], [place] [address], [place], [place] [address]      Labs: Basic Metabolic Panel: Recent Labs  Lab 04/15/21 1802 04/16/21 0908 04/17/21 0859  NA 135 136 136  K 5.4* 6.4* 5.0  CL 98 99 97*  CO2 '24 24 26  ' GLUCOSE 78 89  119*  BUN 30* 35* 34*  CREATININE 6.59* 7.42* 6.19*  CALCIUM 6.6* 6.5* 7.2*  MG  --  1.9  --   PHOS  --  5.1* 3.7   Liver Function Tests: Recent Labs  Lab 04/15/21 1802 04/16/21 0908 04/17/21 0859  AST 30 23  --   ALT 34 30  --   ALKPHOS 91 81  --   BILITOT 1.6* 1.7*  --   PROT 6.8 6.8  --   ALBUMIN 2.9* 2.8* 2.7*   No results for input(s): LIPASE, AMYLASE in the last 168 hours. No results for input(s): AMMONIA in the last 168 hours. CBC: Recent Labs  Lab 04/15/21 1802 04/16/21 0908 04/17/21 0858  WBC 7.6 6.2 4.6  NEUTROABS  --  4.9  --   HGB 13.6 13.2 12.7*  HCT 44.1 42.3 41.7  MCV 101.1* 99.8 101.2*  PLT 175 171 175   Cardiac Enzymes: No results for input(s): CKTOTAL, CKMB, CKMBINDEX, TROPONINI in the last 168 hours. BNP: BNP (last 3 results) No results for input(s): BNP in the last 8760 hours.  ProBNP (last 3 results) No results for input(s): PROBNP in the last 8760 hours.  CBG: Recent Labs  Lab 04/16/21 1511 04/16/21 1643 04/16/21 1955 04/16/21 2120 04/17/21 0534  GLUCAP 69* 125* 222* 204*  112*       Signed:  Dia Crawford, MD [facility]

## 2021-04-17 NOTE — Progress Notes (Signed)
[facility] Progress Note  Subjective: seen on HD, no new c/o  Vitals:   04/17/21 1000 04/17/21 1030 04/17/21 1100 04/17/21 1130  BP: 1'02/75 93/73 93/61 '$ (P) 94/65  Pulse:      Resp: '20 16 15 '$ (P) 20  Temp:      TempSrc:      SpO2:      Weight:      Height:        Exam:   alert, nad   no jvd  Chest cta bilat  Cor reg no RG  Abd soft ntnd no ascites   Ext no LE edema   Alert, NF, ox3    TDC chest    OP HD: NW TTS  4h  400/500  69kg  2K/3.5 bath  Hep 4000  TDC Last post HD weight 71.6kg on 4/16 Calcitriol 1 mcg PO q HD No ESA Binder: Phoslo/Renvela?- no binder per outpatient records  Assessment/Plan: 1.  Hypoglycemia: Due to poor PO intake and continuing to take insulin. SP D10 infusion.  Also reported nausea and vomiting, viral gastroenteritis suspected. Feeling better. Blood cx's negative. Low grade temp resolved. Per primary team.  2.  ESRD - on HD TTS. HD again today to get back on sched 3.  Hypertension/volume: CXR clear. Takes midodrine '5mg'$  pre-HD, will continue here. Up 5-6kg pre HD this am, UF goal 3L today.  4.  Anemia: Hemoglobin 13.6. No ESA indicated.  5.  Metabolic bone disease: Corrected calcium 7.5. Continue 3.5Ca bath and VDRA. Phoslo and renvela ordered here but appears this was stopped at some point outpatient, last phos was 5.0 on 4/14. Will continue phoslo for now and trend phos level. Stopping renvela.  6.  Nutrition:  Albumin low, poor PO intake recently. Continue protein supplement.  7. Dispo - possible dc today. OK for dc from renal standpoint.      Rob Oskar Cretella 04/17/2021, 12:21 PM   Recent Labs  Lab 04/16/21 0908 04/17/21 0858 04/17/21 0859  K 6.4*  --  5.0  BUN 35*  --  34*  CREATININE 7.42*  --  6.19*  CALCIUM 6.5*  --  7.2*  PHOS 5.1*  --  3.7  HGB 13.2 12.7*  --    Inpatient medications: . (feeding supplement) PROSource Plus  30 mL Oral TID BM  . aspirin  81 mg Oral Daily  . atorvastatin  20 mg Oral Daily  .  calcitRIOL  1 mcg Oral Q T,Th,Sa-HD  . calcium acetate  1,334 mg Oral TID WC  . Chlorhexidine Gluconate Cloth  6 each Topical Q0600  . Chlorhexidine Gluconate Cloth  6 each Topical Q0600  . donepezil  10 mg Oral Daily  . gabapentin  200 mg Oral BID  . glucose  2 tablet Oral QID  . heparin injection (subcutaneous)  5,000 Units Subcutaneous Q8H  . lidocaine  1 patch Transdermal Q24H  . lipase/protease/amylase  24,000 Units Oral TID WC  . midodrine  5 mg Oral Q T,Th,S,Su  . multivitamin  1 tablet Oral QHS  . mupirocin ointment  1 application Nasal BID  . pantoprazole  40 mg Oral Daily  . sodium zirconium cyclosilicate  10 g Oral Once    guaiFENesin-dextromethorphan

## 2021-04-17 NOTE — TOC Progression Note (Signed)
Transition of Care [facility]) - Progression Note    Patient Details  Name: ELDO WOHLER MRN: [id number] Date of Birth: 07-Nov-1956  Transition of Care [facility]) CM/SW Contact  Reece Agar, [place] Phone Number: 04/17/2021, 4:11 PM  Clinical Narrative:    4:00pm- [facility] contacted CSW to go over needed changes for pt to return. They wanted flex pen and blood sugar instructions to be more in detail and they cannot do renal diet it has to be normal or concentrated sugar CSW shared this information with RN and MD. CSW will follow up.        Expected Discharge Plan and Services           Expected Discharge Date: 04/17/21                                     Social Determinants of Health (SDOH) Interventions    Readmission Risk Interventions No flowsheet data found.

## 2021-04-17 NOTE — NC FL2 (Addendum)
[place] MEDICAID FL2 LEVEL OF CARE SCREENING TOOL     IDENTIFICATION  Patient Name: Jose Robinson Birthdate: 04-16-1956 Sex: male Admission Date (Current Location): 04/15/2021  [facility] and [place] Number:  Herbalist and Address:  [facility] [place], [place] [address], [place],  [address]      Provider Number: [id number]  Attending Physician Name and Address:  [name], MD  Relative Name and Phone Number:  [place], Burkeen (Daughter)   [phone]    Current Level of Care: Hospital Recommended Level of Care: [place] Prior Approval Number:    Date Approved/Denied:   PASRR Number:    Discharge Plan: Other (Comment)    Current Diagnoses: Patient Active Problem List   Diagnosis Date Noted  . Diabetes mellitus type 2, uncontrolled, with complications ([place]) 0000000  . Diabetic nephropathy ([place]) 04/16/2021  . Chronic systolic CHF (congestive heart failure) ([employer]) 04/16/2021  . Hypoglycemia 04/15/2021  . Hyperglycemia 04/15/2021  . DKA (diabetic ketoacidosis) ([place]) 01/24/2021  . COVID-19 virus infection 01/24/2021  . Syncope 01/24/2021  . ESRD (end stage renal disease) ([place]) 01/24/2021  . Congestive heart failure (CHF) ([place]) 01/24/2021  . Hypotension 01/24/2021  . Shock ([place]) 01/24/2021    Orientation RESPIRATION BLADDER Height & Weight     Self,Time,Situation,Place  Normal Continent Weight: 157 lb 10.1 oz (71.5 kg) Height:  '5\' 11"'$  (180.3 cm)  BEHAVIORAL SYMPTOMS/MOOD NEUROLOGICAL BOWEL NUTRITION STATUS      Incontinent Diet (Renal; Carb Modified; Fluid restrictions)  AMBULATORY STATUS COMMUNICATION OF NEEDS Skin   Limited Assist Verbally Other (Comment) (Leg Amputation)                       Personal Care Assistance Level of Assistance  Bathing,Dressing,Feeding Bathing Assistance: Limited assistance Feeding assistance: Independent Dressing Assistance: Limited assistance     Functional  Limitations Info  Sight,Hearing,Speech Sight Info: Adequate Hearing Info: Adequate Speech Info: Adequate    SPECIAL CARE FACTORS FREQUENCY  PT (By licensed PT),OT (By licensed OT)     PT Frequency: 3x a week OT Frequency: 3x a week            Contractures Contractures Info: Not present    Additional Factors Info  Code Status,Allergies Code Status Info: Full Allergies Info: Acetaminophen   Prednisone   Ivp Dye (Iodinated Diagnostic Agents)           Current Medications (04/17/2021):  This is the current hospital active medication list Medications 04/17/21 04/18/21 04/19/21 04/20/21 04/21/21 04/22/21 04/23/21  (feeding supplement) PROSource Plus liquid 30 mL Dose: 30 mL Freq: 3 times daily between meals Route: PO Start: 04/16/21 1900  Admin Instructions:  This product is intended for oral use.Contact Registered Dietitian/Pharmacist if questions.     (1333)   1345   1900      1000   1400   1900      1000   1400   1900      1000   1400   1900      1000   1400   1900      1000   1400   1900      1000   1400   1900     aspirin chewable tablet 81 mg Dose: 81 mg Freq: Daily Route: PO Start: 04/16/21 1000     1317      1000      1000      1000  1000      1000      1000     atorvastatin (LIPITOR) tablet 20 mg Dose: 20 mg Freq: Daily Route: PO Start: 04/16/21 1000     1313      1000      1000      1000      1000      1000      1000     calcitRIOL (ROCALTROL) capsule 1 mcg Dose: 1 mcg Freq: Every T-Th-Sa (Hemodialysis) Route: PO Start: 04/17/21 1200     1315       1200       1200       calcium acetate (PHOSLO) capsule 1,334 mg Dose: 1,334 mg Freq: 3 times daily with meals Route: PO Start: 04/16/21 0800  Admin Instructions:  Give with food.     1313   (1333)   1700      0800   1200   1700      0800   1200   1700      0800   1200   1700       0800   1200   1700      0800   1200   1700      0800   1200   1700     Chlorhexidine Gluconate Cloth 2 % PADS 6 each Dose: 6 each Freq: Daily Route: TP Start: 04/16/21 1000     0617      0600      0600      0600      0600      0600      0600     Chlorhexidine Gluconate Cloth 2 % PADS 6 each Dose: 6 each Freq: Daily Route: TP Start: 04/16/21 1000 End: 04/21/21 0559  Admin Instructions:  Apply chlorhexidine with firm massage to remove bacteria. Skin may feel sticky for a few minutes after application.  Do NOT wipe off.Allow to air dry. 6 - cloth bath instructions: 1. Neck, shoulders, and chest 2. Both arms and hands 3. Abdomen and groin 4. Right leg and foot 5. Left leg and foot 6. Back and buttocks      0617      0600      0600      0600      0559-D/C'd      donepezil (ARICEPT) tablet 10 mg Dose: 10 mg Freq: Daily Route: PO Start: 04/16/21 1000     (1320)      1000      1000      1000      1000      1000      1000     gabapentin (NEURONTIN) capsule 200 mg Dose: 200 mg Freq: 2 times daily Route: PO Start: 04/16/21 2200  Admin Instructions:  Note dose     1316   2200      1000   2200      1000   2200      1000   2200      1000   2200      1000   2200      1000   2200     glucose chewable tablet 8 g Dose: 2 tablet Freq: 4 times daily Route: PO Start: 04/16/21 1130  Admin Instructions:  Hold dose if cBGs>140 per MD     (1000)   1350  $'1800   2200      1000   1400   1800   2200      1000   1400   1800   2200      1000   1400   1800   2200      1000   1400   1800   2200      1000   1400   1800   2200      1000   1400   1800   'h$ 2200     heparin injection 5,000 Units Dose: 5,000 Units Freq: Every 8 hours Route: Strong Start: 04/16/21 2200     0616   1400   2200      0600   1400   2200       0600   1400   2200      0600   1400   2200      0600   1400   2200      0600   1400   2200      0600   1400   2200     lidocaine (LIDODERM) 5 % 1 patch Dose: 1 patch Freq: Every 24 hours Route: TD Start: 04/16/21 2100  Admin Instructions:  Apply to area of neck pain.     1351   2100      2100      2100      2100      2100      2100      2100     lipase/protease/amylase (CREON) capsule 24,000 Units Dose: 24,000 Units Freq: 3 times daily with meals Route: PO Start: 04/16/21 0700     0616   1346   1700      0700   1200   1700      0700   1200   1700      0700   1200   1700      0700   1200   1700      0700   1200   1700      0700   1200   1700     midodrine (PROAMATINE) tablet 5 mg Dose: 5 mg Freq: Every T-Th-S-Su Route: PO Start: 04/17/21 1000  Admin Instructions:  Do NOT hold for dialysis. GIVE DOSE DURING DAYLIGHT HOURS ONLY     1314       1000       1000      1000      multivitamin (RENA-VIT) tablet 1 tablet Dose: 1 tablet Freq: Daily at bedtime Route: PO Start: 04/16/21 2200  Admin Instructions:  Can be crushed and given per tube.     2200      2200      2200      2200      2200      2200      2200     mupirocin ointment (BACTROBAN) 2 % 1 application Dose: 1 application Freq: 2 times daily Route: NA Start: 04/16/21 1000 End: 04/21/21 0959  Admin Instructions:  For mupirocin (BACTROBAN) Nasal - with cotton tip swab, apply pea sized amount into each nare.Gently press sides of nose together to spread oint. AVOID contact with eyes.     1348   2200      1000   2200      1000   2200  1000   2200      0959-D/C'd      pantoprazole (PROTONIX) EC tablet 40 mg Dose: 40 mg Freq: Daily Route: PO Start: 04/16/21 1000     1317      1000      1000      1000      1000      1000      1000     sodium  zirconium cyclosilicate (LOKELMA) packet 10 g Dose: 10 g Freq: Once Route: PO Start: 04/16/21 1100  Admin Instructions:  Dissolve each packet in at least 45 mL of water, stir well, and administer immediately. Administer at least 2 hours before or after other oral medications. For per tube admin, continuously shake syringe to prevent settlement. Flush tube well after admin.           Medications 04/17/21 04/18/21 04/19/21 04/20/21 04/21/21 04/22/21 04/23/21      Continuous Meds Sorted by Name for Dhruva, Fearnley as of 04/17/21 1411 Legend:                    Inactive    Active    Linked            Medications 04/17/21 04/18/21 04/19/21 04/20/21 04/21/21 04/22/21 04/23/21     PRN Meds Sorted by Name for Enri, [place] as of 04/17/21 1411 Legend:                    Inactive    Active    Linked            Medications 04/17/21 04/18/21 04/19/21 04/20/21 04/21/21 04/22/21 04/23/21  glucose chewable tablet 4-8 g Dose: 1-2 tablet Freq: Every 4 hours PRN Route: PO PRN Reason: low blood sugar PRN Comment: CBG<80 Start: 04/16/21 1007 End: 04/16/21 1035           guaiFENesin-dextromethorphan (ROBITUSSIN DM) 100-10 MG/5ML syrup 10 mL Dose: 10 mL Freq: Every 4 hours PRN Route: PO PRN Reason: cough PRN Comment: chest congestion Start: 04/16/21 1709     0223   0617   1345           guaiFENesin-dextromethorphan (ROBITUSSIN DM) 100-10 MG/5ML syrup 5 mL Dose: 5 mL Freq: Every 4 hours PRN Route: PO PRN Reason: cough PRN Comment: chest congestion Start: 04/16/21 [id number] End: 04/16/21 1709              Discharge Medications: Please see discharge summary for a list of discharge medications.  Relevant Imaging Results:  Relevant Lab Results:   Additional Information HD TTS -SSN [id number]  Reece Agar, LCSWA

## 2021-04-18 DIAGNOSIS — E11649 Type 2 diabetes mellitus with hypoglycemia without coma: Secondary | ICD-10-CM | POA: Diagnosis not present

## 2021-04-18 LAB — GLUCOSE, CAPILLARY
Glucose-Capillary: 119 mg/dL — ABNORMAL HIGH (ref 70–99)
Glucose-Capillary: 170 mg/dL — ABNORMAL HIGH (ref 70–99)

## 2021-04-18 NOTE — TOC Transition Note (Signed)
Transition of Care [facility]) - CM/SW Discharge Note   Patient Details  Name: Jose Robinson MRN: [id number] Date of Birth: [birth year]  [facility] CM/SW Contact:  Tresa Endo Phone Number: 04/18/2021, 11:58 AM   Clinical Narrative:    Patient will DC to: [facility] Anticipated DC date: 04/18/2021 Family notified: Pt Transport by: Corey Harold   Per MD patient ready for DC to [facility]. RN to call report prior to discharge [phone]). RN, patient, patient's family, and facility notified of DC. Discharge Summary and FL2 sent to facility. DC packet on chart. Ambulance transport requested for patient.   CSW will sign off for now as social work intervention is no longer needed. Please consult [place] again if new needs arise.            Patient Goals and CMS Choice        Discharge Placement                       Discharge Plan and Services                                     Social Determinants of Health (SDOH) Interventions     Readmission Risk Interventions No flowsheet data found.

## 2021-04-18 NOTE — Discharge Summary (Addendum)
Addendum to Dr. Madelin Rear d/c summary dated 4/19:  DIET: low carb (diabetic diet) ie no concentrated sweets -please check blood sugars prior to each meal:   novolog sliding scale:   CBG 70 - 120: 0 units  CBG 121 - 150: 0 units  CBG 151 - 200: 1 unit  CBG 201-250: 2 units  CBG 251-300: 3 units  CBG 301-350: 4 units  CBG 351-400: 5 units  CORRECT d/c med list on 4/20:   Allergies as of 04/18/2021      Reactions   Acetaminophen Palpitations   Prednisone Other (See Comments)   Severe hallucinations/paranoid delusions after steroid premedications for contrast allergy, required IV haldol and restraints.   Ivp Dye [iodinated Diagnostic Agents] Other (See Comments)      Medication List    STOP taking these medications   glucagon 1 MG injection   LANTUS SOLOSTAR    omeprazole 20 MG capsule Commonly known as: PRILOSEC Replaced by: pantoprazole 40 MG tablet   pantoprazole 20 MG tablet Commonly known as: PROTONIX     TAKE these medications   aspirin 81 MG chewable tablet Chew 1 tablet (81 mg total) by mouth daily.   atorvastatin 20 MG tablet Commonly known as: LIPITOR Take 1 tablet (20 mg total) by mouth daily.   blood glucose meter kit and supplies Kit Dispense based on patient and insurance preference. Use up to four times daily as directed. (FOR ICD-9 250.00, 250.01).   calcitRIOL 0.5 MCG capsule Commonly known as: ROCALTROL Take 2 capsules (1 mcg total) by mouth Every Tuesday,Thursday,and Saturday with dialysis.   calcium acetate 667 MG capsule Commonly known as: PHOSLO Take 2 capsules (1,334 mg total) by mouth 3 (three) times daily with meals.   calcium carbonate 500 MG chewable tablet Commonly known as: TUMS - dosed in mg elemental calcium Chew 2 tablets by mouth daily as needed (gas).   donepezil 10 MG tablet Commonly known as: ARICEPT Take 10 mg by mouth daily.   gabapentin 100 MG capsule Commonly known as: NEURONTIN Take 200 mg by mouth 3 (three) times  daily.   glucose 4 GM chewable tablet Chew 2 tablets (8 g total) by mouth 4 (four) times daily as needed for low blood sugar (for CBG<80).   guaiFENesin-dextromethorphan 100-10 MG/5ML syrup Commonly known as: ROBITUSSIN DM Take 10 mLs by mouth every 4 (four) hours as needed for cough (chest congestion).   lidocaine 5 % Commonly known as: LIDODERM Place 1 patch onto the skin daily. Remove & Discard patch within 12 hours or as directed by MD   loperamide 2 MG tablet Commonly known as: IMODIUM A-D Take 2-4 mg by mouth See admin instructions. 4 mg after 1st loose stool, then 2 mg after each loose stool. Max 4 tabs in 24 hours   midodrine 5 MG tablet Commonly known as: PROAMATINE Take 1 tablet (5 mg total) by mouth every Tuesday, Thursday, Saturday, and Sunday. What changed:   when to take this  additional instructions   naproxen sodium 220 MG tablet Commonly known as: ALEVE Take 440 mg by mouth 2 (two) times daily as needed (pain/headache).   NovoLOG FlexPen 100 UNIT/ML FlexPen Generic drug: insulin aspart CBG 70 - 120: 0 units  CBG 121 - 150: 0 units  CBG 151 - 200: 1 unit  CBG 201-250: 2 units  CBG 251-300: 3 units  CBG 301-350: 4 units  CBG 351-400: 5 units  ondansetron 4 MG tablet Commonly known as: ZOFRAN Take 4 mg  by mouth every 8 (eight) hours as needed for nausea or vomiting.   pantoprazole 40 MG tablet Commonly known as: PROTONIX Take 1 tablet (40 mg total) by mouth daily. Replaces: omeprazole 20 MG capsule   Trulicity 1.5 KE/0.9HA Sopn Generic drug: Dulaglutide Inject 1.5 mg into the skin every Thursday.   Zenpep 5000-24000 units Cpep Generic drug: Pancrelipase (Lip-Prot-Amyl) Take 1 capsule by mouth 3 (three) times daily with meals.       If confusion re-occurs in absence of hypoglycemia would decrease Neurontin.  Eulogio Bear DO

## 2021-04-18 NOTE — Progress Notes (Signed)
Gave a report to Wells Guiles, Therapist, sports at [facility]. Removed PIV access and awaiting for [facility]. HS [employer]

## 2021-04-18 NOTE — Progress Notes (Signed)
Patient took his all belongings. PT[facility] picked up the patient. HS [employer]

## 2021-04-18 NOTE — Care Management CC44 (Signed)
Condition Code 44 Documentation Completed  Patient Details  Name: Jose Robinson MRN: [id number] Date of Birth: Aug 24, 1956   Condition Code 44 given:  Yes Patient signature on Condition Code 44 notice:  Yes Documentation of 2 MD's agreement:  Yes Code 44 added to claim:  Yes    Zenon Mayo, RN 04/18/2021, 1:49 PM

## 2021-04-18 NOTE — Care Management Obs Status (Signed)
[place] NOTIFICATION   Patient Details  Name: Jose Robinson MRN: [id number] Date of Birth: 1956-11-13   Medicare Observation Status Notification Given:  Yes    Zenon Mayo, RN 04/18/2021, 1:49 PM

## 2021-04-20 LAB — CULTURE, BLOOD (ROUTINE X 2)
Culture: NO GROWTH
Special Requests: ADEQUATE

## 2021-05-08 NOTE — Therapy (Incomplete)
OUTPATIENT OCCUPATIONAL THERAPY NEURO EVALUATION  Patient Name: LOSSIE JURGENSEN MRN: [id number] DOB:[birth year], 65 y.o., male Today's Date: 05/08/2021  PCP: Pcp, No REFERRING PROVIDER: Buzzy Han*     Past Medical History:  Diagnosis Date  . Diabetes mellitus without complication ([place])   . ESRD on hemodialysis ([place])   . Hypertension    Past Surgical History:  Procedure Laterality Date  . BACK SURGERY    . BELOW KNEE LEG AMPUTATION     Patient Active Problem List   Diagnosis Date Noted  . Diabetes mellitus type 2, uncontrolled, with complications ([place]) 0000000  . Diabetic nephropathy ([place]) 04/16/2021  . Chronic systolic CHF (congestive heart failure) ([place]) 04/16/2021  . Hypoglycemia 04/15/2021  . Hyperglycemia 04/15/2021  . DKA (diabetic ketoacidosis) ([place]) 01/24/2021  . COVID-19 virus infection 01/24/2021  . Syncope 01/24/2021  . ESRD (end stage renal disease) ([place]) 01/24/2021  . Congestive heart failure (CHF) ([place]) 01/24/2021  . Hypotension 01/24/2021  . Shock ([place]) 01/24/2021    REFERRING DIAG: M24.50 (ICD-10-CM) - Contracture, unspecified joint    THERAPY DIAG:  No diagnosis found.  SUBJECTIVE:   PATIENT HISTORY: Pt is a 65 year old that presents to Neuro OPOT with referral for contracture of unspecified joint. Pt ***                                                                                                                                                                                                              PAIN:  Are you having pain? {yes/no:20286} VAS scale: ***/10 Pain location: *** Pain orientation: {Pain Orientation:25161}  PAIN TYPE: {type:313116} Pain description: {PAIN DESCRIPTION:21022940}  Aggravating factors: *** Relieving factors: ***  PRECAUTIONS: {Therapy precautions:24002}  WEIGHT BEARING RESTRICTIONS {Yes ***/No:24003}  FALLS: Has patient fallen in last 6 months? {yes/no:20286}, Number of falls: ***  LIVING  ENVIRONMENT: Lives with: {places; lives with:5711::"lives with their family":1} Lives in: {CHL Living Situation:16014002} Stairs: {yes/no:20286}; {Stairs:24000} Has following equipment at home: {Assistive devices:23999}  PLOF: {PLOF:24004}  PATIENT GOALS ***  OBJECTIVE:   DIAGNOSTIC FINDINGS: ***  COGNITION: Overall cognitive status: {cognition:24006} Areas of impairment: {cognitive impairment:24009} Commands: {commands:24018} Attention: {intact/deficits:24005} Memory: {intact/deficits:24005} Awareness: {intact/deficits:24005} Problem solving: {intact/deficits:24005} Executive function:{Executive functioning:24008} Bradyphrenia: {yes/no:20286} Behavior: {behavior:24019}  ADLs: Overall ADLs: *** Eating: *** Grooming: *** UB Dressing: *** LB Dressing: *** Toileting: *** Bathing: ***   IADLs: Shopping: *** Light housekeeping: *** Meal Prep: *** Community mobility: *** Medication management: *** Financial management: ***   MOBILITY STATUS: {OTMOBILITY:25360}  WRITTEN EXPRESSION:  Dominant hand: {RIGHT/LEFT:20294} Handwriting: {OTWRITTENEXPRESSION:25361} Written  experience: {OTWRITTENEXPERIENCE:25362}  VISION: Subjective report: *** Baseline vision: {OTBASELINEVISION:25363} Visual history: {OTVISUALHISTORY:25364}  VISION ASSESSMENT: Eye alignment: {WFL-Impaired:25365} Reading acuity: {OTREADINGACUITY:25366} Acuity:  Ocular ROM: {OTOCULARROM:25367} Gaze preference/alignment: {OTGAZEPREFERENCE/ALIGNMENT:25368} Tracking/Visual pursuits: {OTTRACKING/VISUALPURSUITS:25369} Saccades: {OTSACCADES:25370} Convergence: {WFL-Impaired:25365} Visual Fields: {OTVISUALFIELDS:25371} Diplopia assessment: {OTDIPLOPIA:25373} Depth perception: ***  Patient has difficulty with following activities due to visual impairments: ***  ACTIVITY TOLERANCE: Activity tolerance: {ACTIVITY TOLERANCE:25482} Sitting balance: {sitting balance:25483}  PATIENT SURVEYS: {rehab  surveys:24030}  POSTURE COMMENTS: ***  SENSATION: Light touch: {IMPAIRED:25374} Stereognosis: {IMPAIRED:25374} Hot/Cold: {IMPAIRED:25374} Proprioception: {IMPAIRED:25374} Semmes Weinstein Monofilament scale: {semmes weinstein monofilament scale:25375}  COORDINATION: Finger Nose Finger test: *** Heel Shin test: *** 9 Hoe Peg test: Right: *** sec; Left: *** sec Box and Blocks: Comments: ***  PERCEPTION: {WFL-Impaired:25365}  PRAXIS: {WFL-Impaired:25365}  EDEMA: ***  TONE:***  SKIN INTEGRITY: ***  PALPATION: ***  UE AROM/PROM:  A/PROM Right 05/08/2021 Left 05/08/2021  Shoulder flexion *** deg *** deg  Shoulder abduction *** deg *** deg  Shoulder adduction *** deg *** deg  Shoulder extension *** deg *** deg  Shoulder internal rotation *** deg *** deg  Shoulder external rotation *** deg *** deg  Elbow flexion *** deg *** deg  Elbow extension *** deg *** deg  Wrist flexion *** deg *** deg  Wrist extension *** deg *** deg  Wrist ulnar deviation *** deg *** deg  Wrist radial deviation *** deg *** deg  Wrist pronation *** deg *** deg  Wrist supination *** deg *** deg   HAND A/PROM:  A/PROM Right 05/08/2021 Left 05/08/2021  Thumb MCP (0-60) *** deg *** deg  Thumb IP (0-80) *** deg *** deg  Thumb Radial abd/add (0-55) *** deg *** deg  Thumb Palmar abd/add (0-45) *** deg *** deg  Thumb opposition to index *** deg *** deg  Index MCP (0-90) *** deg *** deg  Index PIP (0-100) *** deg *** deg  Index DIP (0-70) *** deg *** deg  Long MCP (0-90) *** deg *** deg  Long PIP (0-100) *** deg *** deg  Long DIP (0-70) *** deg *** deg  Ring MCP (0-90_ *** deg *** deg  Ring PIP (0-100) *** deg *** deg  Ring DIP (0-70) *** deg *** deg  Little MCP (0-90) *** deg *** deg  Little PIP (0-100) *** deg *** deg  Little DIP (0-70) *** deg *** deg    UE MMT:  MMT Right 05/08/2021 Left 05/08/2021  Shoulder flexion ***/5 ***/5  Shoulder abduction ***/5 ***/5  Shoulder adduction  ***/5 ***/5  Shoulder extension ***/5 ***/5  Middle trapezius ***/5 ***/5  Lower trapezius ***/5 ***/5  Elbow flexion ***/5 ***/5  Elbow extension ***/5 ***/5  Wrist flexion ***/5 ***/5  Wrist extension ***/5 ***/5  Wrist ulnar deviation ***/5 ***/5  Wrist radial deviation ***/5 ***/5  Wrist pronation ***/5 ***/5  Wrist supination ***/5 ***/5    HAND FUNCTION:  Grip strength: Right: *** lbs; Left: *** lbs Lateral pinch: Right: *** lbs, Left: *** lbs 3 point pinch: Right: *** lbs, Left: *** lbs Comments: ***  TODAY'S TREATMENT:  ***   PATIENT EDUCATION: Education details: *** Person educated: {Person educated:25204} Education method: {Education Method:25205} Education comprehension: {Education Comprehension:25206}   HOME EXERCISE PROGRAM: ***  ASSESSMENT:  CLINICAL IMPRESSION: Patient is a *** y.o. *** who was seen today for occupational therapy evaluation and treatment for ***. Patient has performance deficits in functional skills including {OT physical skills:25468}, cognitive skills including {OT cognitive skills:25469}, and psychosocial skills including {OT psychosocial skills:25470}. These impairments are limiting  patient from {OT performance deficits:25471}. Patient {Comorbidities:25485} that affects occupational performance. Patient will benefit from skilled OT to address above impairments and improve overall function.  MODIFICATION OR ASSISTANCE TO COMPLETE EVALUATION: {OT modification:25474}  OT OCCUPATIONAL PROFILE AND HISTORY: {OT PROFILE AND HISTORY:25484}  CLINICAL DECISION MAKING: {OT CDM:25475}  REHAB POTENTIAL: {rehabpotential:25112}  EVALUATION COMPLEXITY: {Evaluation complexity:25115}     GOALS: Goals reviewed with patient? {yes/no:20286}  SHORT TERM GOALS:  STG Name Target Date Goal status  1 *** Comments:  *** {GOALSTATUS:25110}  2 *** Comments:  *** {GOALSTATUS:25110}  3 *** Comments:  *** {GOALSTATUS:25110}  4 *** Comments:  ***  {GOALSTATUS:25110}  5 *** Comments:  *** {GOALSTATUS:25110}  6 *** Comments:  *** {GOALSTATUS:25110}  7 *** Comments:  *** {GOALSTATUS:25110}   LONG TERM GOALS:   LTG Name Target Date Goal status  1 *** Comments:  *** {GOALSTATUS:25110}  2 *** Comments:  *** {GOALSTATUS:25110}  3 *** Comments:  *** {GOALSTATUS:25110}  4 *** Comments:  *** {GOALSTATUS:25110}  5 *** Comments:  *** {GOALSTATUS:25110}  6 *** Comments: *** {GOALSTATUS:25110}  7 *** Comments:  *** {GOALSTATUS:25110}   PLAN: OT FREQUENCY: {rehab frequency:25116}  OT DURATION: {rehab duration:25117}  PLANNED INTERVENTIONS: {OT Interventions:25467}  PLAN FOR NEXT SESSION: ***  RECOMMENDED OTHER SERVICES: ***  CONSULTED AND AGREED WITH PLAN OF CARE: [id number]   Zachery Conch 05/08/2021, 5:54 PM  [facility] [address] [place], [place], [address] Phone: [phone]   Fax:  [phone]  Patient name: IVO ACKERS MRN: [id number] DOB: 09-28-1956

## 2021-05-09 ENCOUNTER — Ambulatory Visit: Payer: Medicare (Managed Care) | Admitting: Occupational Therapy

## 2021-05-30 ENCOUNTER — Ambulatory Visit: Payer: Medicare (Managed Care) | Admitting: Occupational Therapy

## 2021-06-06 ENCOUNTER — Other Ambulatory Visit: Payer: Self-pay

## 2021-06-06 ENCOUNTER — Ambulatory Visit: Payer: Medicare (Managed Care) | Attending: Family Medicine | Admitting: Occupational Therapy

## 2021-06-06 ENCOUNTER — Encounter: Payer: Self-pay | Admitting: Occupational Therapy

## 2021-06-06 DIAGNOSIS — M6281 Muscle weakness (generalized): Secondary | ICD-10-CM | POA: Diagnosis present

## 2021-06-06 DIAGNOSIS — M25622 Stiffness of left elbow, not elsewhere classified: Secondary | ICD-10-CM

## 2021-06-06 DIAGNOSIS — R2689 Other abnormalities of gait and mobility: Secondary | ICD-10-CM | POA: Diagnosis present

## 2021-06-06 DIAGNOSIS — M25621 Stiffness of right elbow, not elsewhere classified: Secondary | ICD-10-CM

## 2021-06-06 DIAGNOSIS — R208 Other disturbances of skin sensation: Secondary | ICD-10-CM

## 2021-06-06 DIAGNOSIS — M25641 Stiffness of right hand, not elsewhere classified: Secondary | ICD-10-CM

## 2021-06-06 DIAGNOSIS — Z89511 Acquired absence of right leg below knee: Secondary | ICD-10-CM | POA: Insufficient documentation

## 2021-06-06 DIAGNOSIS — R2681 Unsteadiness on feet: Secondary | ICD-10-CM | POA: Diagnosis present

## 2021-06-06 DIAGNOSIS — R600 Localized edema: Secondary | ICD-10-CM | POA: Diagnosis not present

## 2021-06-06 DIAGNOSIS — M25642 Stiffness of left hand, not elsewhere classified: Secondary | ICD-10-CM

## 2021-06-06 NOTE — Therapy (Signed)
[facility] [address] [place], [place], [address] Phone: [phone]   Fax:  [phone]  Occupational Therapy Evaluation  Patient Details  Name: Jose Robinson MRN: [id number] Date of Birth: April 23, 1956 Referring Provider (OT): Tobias Alexander   Encounter Date: 06/06/2021   OT End of Session - 06/06/21 1538    Visit Number 1    Number of Visits 17    Date for OT Re-Evaluation 08/20/21    OT Start Time 1330    OT Stop Time 1400    OT Time Calculation (min) 30 min    Activity Tolerance Patient tolerated treatment well    Behavior During Therapy [facility] for tasks assessed/performed           Past Medical History:  Diagnosis Date  . Diabetes mellitus without complication ([place])   . ESRD on hemodialysis ([place])   . Hypertension     Past Surgical History:  Procedure Laterality Date  . BACK SURGERY    . BELOW KNEE LEG AMPUTATION      There were no vitals filed for this visit.   Subjective Assessment - 06/06/21 1337    Subjective  I need some good upper body strength    Currently in Pain? Yes    Pain Score 8     Pain Location Leg    Pain Orientation Left    Pain Descriptors / Indicators Aching    Pain Type Chronic pain             [facility] OT Assessment - 06/06/21 0001      Assessment   Medical Diagnosis contractures    Referring Provider (OT) [place]Any[place], A    Onset Date/Surgical Date 12/31/19    Hand Dominance Right    Prior Therapy hospital      Precautions   Precautions Other (comment)   Dialysis T/TH/Sat   Precaution Comments ESRD-HD, DM      Balance Screen   Has the patient fallen in the past 6 months No      Prior Function   Level of Independence Independent with basic ADLs      ADL   Eating/Feeding Minimal assistance    Grooming Moderate assistance    Upper Body Bathing Maximal assistance    Lower Body Bathing + 1 Total assistance    Upper Body Dressing Moderate assistance     Lower Body Dressing +1 Total aassistance    Toilet Transfer + 1 Total assistance    Toileting - Clothing Manipulation Maximal assistance    Toileting -  Hygiene + 1 Total assistance    ADL comments Cannot shower perm cath      Written Expression   Dominant Hand Right    Handwriting 50% legible      Vision Assessment   Comment "I got to go to an eye specialist"  Has not been in awhile      Activity Tolerance   Activity Tolerance Comments Especially limited on dialysis days      Observation/Other Assessments   Focus on Therapeutic Outcomes (FOTO)  NA      Sensation   Light Touch Impaired by gross assessment    Additional Comments Reports numbness      Coordination   Gross Motor Movements are Fluid and Coordinated No    Fine Motor Movements are Fluid and Coordinated No    Coordination Limited elbow extension, left supination, grasp/release      Edema   Edema Bilateral hands  ROM / Strength   AROM / PROM / Strength AROM;Strength      AROM   Overall AROM  Deficits    Overall AROM Comments elbow extension - 40 right, -45 left, supination limited to neutral left, end rnage shoulder flex limited      Strength   Overall Strength Deficits    Overall Strength Comments 4-/5 Bilateral shoulders      Hand Function   Right Hand Gross Grasp Impaired    Right Hand Grip (lbs) 9    Left Hand Gross Grasp Impaired    Left Hand Grip (lbs) 9.7                             OT Short Term Goals - 06/06/21 1549      OT SHORT TERM GOAL #1   Title Patient will complete an HEP designed to improve range of motion in bilateral hands, forearms, and elbows    Baseline No HEP    Time 4    Period Weeks    Status New    Target Date 07/21/21      OT SHORT TERM GOAL #2   Title Patient will complete an HEP designed to build upper body strength    Baseline No HEP    Time 4    Period Weeks    Status New      OT SHORT TERM GOAL #3   Title Patient will don pull over  shirt with set up assistance    Baseline per patient mod assist at times    Time 4    Period Weeks    Status New      OT SHORT TERM GOAL #4   Title Patient will verbalize understanding of process to don prosthesis for RLE    Baseline Dependent    Time 4    Period Weeks    Status New      OT SHORT TERM GOAL #5   Title Patient will demonstrate ability to cut food on plate with set up assistance    Time 4    Period Weeks    Status New             OT Long Term Goals - 06/06/21 1553      OT LONG TERM GOAL #1   Title Patient will complete updated HEP for BUE rane, strength and functional use.    Baseline No HEP    Time 8    Period Weeks    Status New    Target Date 08/20/21      OT LONG TERM GOAL #2   Title Patient will don elastic waist shorts or pants with min assist    Baseline dependent    Time 8    Period Weeks    Status New      OT LONG TERM GOAL #3   Title Patient will demonstrate 5 lb increase in grip strength in BUE to aide in helping to don/doff prosthesis    Baseline less than 10 lbs    Time 8    Period Weeks    Status New      OT LONG TERM GOAL #4   Title Patient will toilet himself with no more than min assist    Baseline total assistance    Time 8    Period Weeks    Status New      OT LONG TERM GOAL #5  Title Patient will demonstrate 90% composite flexion in BUE to aide in functional grasp    Baseline 50% left, 75% right    Time 8    Period Weeks    Status New                 Plan - 06/06/21 1541    Clinical Impression Statement Patient is a 65yo with hx ESRD on T,T,S HD, CAD, PAD, chronic systolic chf, copd, DM who presented to ED in April with generalized weakness, found to have glucose just under 500. While in ED, became unresponsive and hypotensive, requiring IVF resuscitation. Tested covid pos.  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  Patient currrently resides in ALF where he indicates he cannot get therapy.  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  Patient has significant weakness, decreased balance, decreased activity tolerance, decreased range of motion in BUE L>R all of which impede participation in basic self care skills.    OT Occupational Profile and History Detailed Assessment- Review of Records and additional review of physical, cognitive, psychosocial history related to current functional performance    Occupational performance deficits (Please refer to evaluation for details): ADL's    Body Structure / Function / Physical Skills ADL;Dexterity;Flexibility;ROM;Strength;IADL;FMC;Edema;Coordination;Balance;Body mechanics;Decreased knowledge of precautions;Endurance;Pain;Sensation;UE functional use;Skin integrity;Mobility;GMC;Fascial restriction;Decreased knowledge of use of DME    Rehab Potential Fair    Clinical Decision Making Several treatment options, min-mod task modification necessary    Comorbidities Affecting Occupational Performance: May have comorbidities impacting occupational performance    Modification or Assistance to Complete Evaluation  Min-Moderate modification of tasks or assist with assess necessary to complete eval    OT Frequency 2x / week    OT Duration 8 weeks    OT Treatment/Interventions Self-care/ADL training;Therapeutic exercise;DME and/or AE instruction;Functional Mobility Training;Balance training;Fluidtherapy;Neuromuscular education;Manual Therapy;Scar mobilization;Splinting;Patient/family education;Therapeutic activities;Passive range of motion    Plan Need to establish stretching program for hands, forearms, elbows.  Need to address dressing skills and determine where he can assist with ADL    Consulted and Agree with Plan of Care Patient           Patient will benefit from skilled therapeutic intervention in order to improve the following deficits and impairments:   Body Structure / Function / Physical Skills:  ADL,Dexterity,Flexibility,ROM,Strength,IADL,FMC,Edema,Coordination,Balance,Body mechanics,Decreased knowledge of precautions,Endurance,Pain,Sensation,UE functional use,Skin integrity,Mobility,GMC,Fascial restriction,Decreased knowledge of use of DME       Visit Diagnosis: Localized edema - Plan: Ot plan of care cert/re-cert  Unsteadiness on feet - Plan: Ot plan of care cert/re-cert  Muscle weakness (generalized) - Plan: Ot plan of care cert/re-cert  Stiffness of left hand, not elsewhere classified - Plan: Ot plan of care cert/re-cert  Stiffness of right hand, not elsewhere classified - Plan: Ot plan of care cert/re-cert  Stiffness of left elbow, not elsewhere classified - Plan: Ot plan of care cert/re-cert  Stiffness of right elbow, not elsewhere classified - Plan: Ot plan of care cert/re-cert  Other disturbances of skin sensation - Plan: Ot plan of care cert/re-cert    Problem List Patient Active Problem List   Diagnosis Date Noted  . Diabetes mellitus type 2, uncontrolled, with complications ([place]) 0000000  . Diabetic nephropathy ([place]) 04/16/2021  . Chronic systolic CHF (congestive heart failure) ([place]) 04/16/2021  . Hypoglycemia 04/15/2021  . Hyperglycemia 04/15/2021  . DKA (diabetic ketoacidosis) ([place]) 01/24/2021  . COVID-19 virus infection 01/24/2021  . Syncope 01/24/2021  . ESRD (end stage renal disease) ([place]) 01/24/2021  .  Congestive heart failure (CHF) ([place]) 01/24/2021  . Hypotension 01/24/2021  . Shock ([place]) 01/24/2021    Mariah Milling, OTR/L 06/06/2021, 4:08 PM  [facility] [address] [place], [place], [address] Phone: [phone]   Fax:  [phone]  Name: Jose Robinson MRN: [id number] Date of Birth: [birth year]

## 2021-06-15 ENCOUNTER — Ambulatory Visit: Payer: Medicare (Managed Care) | Admitting: Occupational Therapy

## 2021-06-15 ENCOUNTER — Encounter: Payer: Self-pay | Admitting: Occupational Therapy

## 2021-06-15 ENCOUNTER — Other Ambulatory Visit: Payer: Self-pay

## 2021-06-15 DIAGNOSIS — R600 Localized edema: Secondary | ICD-10-CM | POA: Diagnosis not present

## 2021-06-15 DIAGNOSIS — M25642 Stiffness of left hand, not elsewhere classified: Secondary | ICD-10-CM

## 2021-06-15 DIAGNOSIS — M6281 Muscle weakness (generalized): Secondary | ICD-10-CM

## 2021-06-15 DIAGNOSIS — M25621 Stiffness of right elbow, not elsewhere classified: Secondary | ICD-10-CM

## 2021-06-15 DIAGNOSIS — R2681 Unsteadiness on feet: Secondary | ICD-10-CM

## 2021-06-15 DIAGNOSIS — M25622 Stiffness of left elbow, not elsewhere classified: Secondary | ICD-10-CM

## 2021-06-15 DIAGNOSIS — R208 Other disturbances of skin sensation: Secondary | ICD-10-CM

## 2021-06-15 DIAGNOSIS — M25641 Stiffness of right hand, not elsewhere classified: Secondary | ICD-10-CM

## 2021-06-15 NOTE — Therapy (Signed)
Jose Robinson [address] [place], [place], [address] Phone: [phone]   Fax:  [phone]  Occupational Therapy Treatment  Patient Details  Name: Jose Robinson MRN: [id number] Date of Birth: Mar 03, 1956 Referring Provider (OT): Jose Robinson, A   Encounter Date: 06/15/2021   OT End of Session - 06/15/21 1359     Visit Number 2    Number of Visits 17    Date for OT Re-Evaluation 08/20/21    Authorization Type [employer] Medicare/ Medicaid    OT Start Time 1358    OT Stop Time 1445    OT Time Calculation (min) 47 min    Activity Tolerance Patient tolerated treatment well    Behavior During Therapy [facility] for tasks assessed/performed             Past Medical History:  Diagnosis Date   Diabetes mellitus without complication ([place])    ESRD on hemodialysis ([place])    Hypertension     Past Surgical History:  Procedure Laterality Date   BACK SURGERY     BELOW KNEE LEG AMPUTATION      There were no vitals filed for this visit.   Subjective Assessment - 06/15/21 1403     Subjective  I live in a facility right now and then I'll be looking for a new place.    Currently in Pain? No/denies    Pain Score 6     Pain Location Leg    Pain Orientation Right    Pain Descriptors / Indicators Sore    Pain Type Acute pain             PROM self PROM for HEP - supination, pronation, wrist extension/flexion, composite flexion/extension, elbow extension and table slides.  ADLs pt reports he can get himself to the toilet and stand up from wheelchair but has difficulty with his hands and managing his pants and hygiene.   Color Cylindrical Pegs with RUE with no difficulty and with appropriate time. LUE with increased difficulty but no drops.                   OT Education - 06/15/21 1438     Education Details PROM for BUE elbow and distal.    Person(s) Educated Patient    Methods  Explanation;Demonstration    Comprehension Need further instruction;Returned demonstration;Verbalized understanding              OT Short Term Goals - 06/15/21 1438       OT SHORT TERM GOAL #1   Title Patient will complete an HEP designed to improve range of motion in bilateral hands, forearms, and elbows    Baseline No HEP    Time 4    Period Weeks    Status On-going    Target Date 07/21/21      OT SHORT TERM GOAL #2   Title Patient will complete an HEP designed to build upper body strength    Baseline No HEP    Time 4    Period Weeks    Status New      OT SHORT TERM GOAL #3   Title Patient will don pull over shirt with set up assistance    Baseline per patient mod assist at times    Time 4    Period Weeks    Status New      OT SHORT TERM GOAL #4   Title Patient will verbalize understanding of process to don prosthesis for  RLE    Baseline Dependent    Time 4    Period Weeks    Status New      OT SHORT TERM GOAL #5   Title Patient will demonstrate ability to cut food on plate with set up assistance    Time 4    Period Weeks    Status New               OT Long Term Goals - 06/06/21 1553       OT LONG TERM GOAL #1   Title Patient will complete updated HEP for BUE rane, strength and functional use.    Baseline No HEP    Time 8    Period Weeks    Status New    Target Date 08/20/21      OT LONG TERM GOAL #2   Title Patient will don elastic waist shorts or pants with min assist    Baseline dependent    Time 8    Period Weeks    Status New      OT LONG TERM GOAL #3   Title Patient will demonstrate 5 lb increase in grip strength in BUE to aide in helping to don/doff prosthesis    Baseline less than 10 lbs    Time 8    Period Weeks    Status New      OT LONG TERM GOAL #4   Title Patient will toilet himself with no more than min assist    Baseline total assistance    Time 8    Period Weeks    Status New      OT LONG TERM GOAL #5   Title  Patient will demonstrate 90% composite flexion in BUE to aide in functional grasp    Baseline 50% left, 75% right    Time 8    Period Weeks    Status New                   Plan - 06/15/21 1435     Clinical Impression Statement Pt progressing towards goals. Pt agreeable to goals set forth at evaluation.    OT Occupational Profile and History Detailed Assessment- Review of Records and additional review of physical, cognitive, psychosocial history related to current functional performance    Occupational performance deficits (Please refer to evaluation for details): ADL's    Body Structure / Function / Physical Skills ADL;Dexterity;Flexibility;ROM;Strength;IADL;FMC;Edema;Coordination;Balance;Body mechanics;Decreased knowledge of precautions;Endurance;Pain;Sensation;UE functional use;Skin integrity;Mobility;GMC;Fascial restriction;Decreased knowledge of use of DME    Rehab Potential Fair    Clinical Decision Making Several treatment options, min-mod task modification necessary    Comorbidities Affecting Occupational Performance: May have comorbidities impacting occupational performance    Modification or Assistance to Complete Evaluation  Min-Moderate modification of tasks or assist with assess necessary to complete eval    OT Frequency 2x / week    OT Duration 8 weeks    OT Treatment/Interventions Self-care/ADL training;Therapeutic exercise;DME and/or AE instruction;Functional Mobility Training;Balance training;Fluidtherapy;Neuromuscular education;Manual Therapy;Scar mobilization;Splinting;Patient/family education;Therapeutic activities;Passive range of motion    Plan Need to address dressing skills and determine where he can assist with ADL    Consulted and Agree with Plan of Care Patient             Patient will benefit from skilled therapeutic intervention in order to improve the following deficits and impairments:   Body Structure / Function / Physical Skills: ADL, Dexterity,  Flexibility, ROM, Strength, IADL, FMC, Edema, Coordination, Balance, Body  mechanics, Decreased knowledge of precautions, Endurance, Pain, Sensation, UE functional use, Skin integrity, Mobility, GMC, Fascial restriction, Decreased knowledge of use of DME       Visit Diagnosis: Unsteadiness on feet  Localized edema  Stiffness of left hand, not elsewhere classified  Muscle weakness (generalized)  Stiffness of right hand, not elsewhere classified  Stiffness of left elbow, not elsewhere classified  Stiffness of right elbow, not elsewhere classified  Other disturbances of skin sensation    Problem List Patient Active Problem List   Diagnosis Date Noted   Diabetes mellitus type 2, uncontrolled, with complications ([place]) 0000000   Diabetic nephropathy ([place]) 0000000   Chronic systolic CHF (congestive heart failure) ([place]) 04/16/2021   Hypoglycemia 04/15/2021   Hyperglycemia 04/15/2021   DKA (diabetic ketoacidosis) ([place]) 01/24/2021   COVID-19 virus infection 01/24/2021   Syncope 01/24/2021   ESRD (end stage renal disease) ([place]) 01/24/2021   Congestive heart failure (CHF) ([place]) 01/24/2021   Hypotension 01/24/2021   Shock ([place]) 01/24/2021    Zachery Conch MOT, OTR/L  06/15/2021, 2:45 PM  [facility] [address] [place], [place], [address] Phone: [phone]   Fax:  [phone]  Name: Jose Robinson MRN: [id number] Date of Birth: [birth year]

## 2021-06-20 ENCOUNTER — Ambulatory Visit: Payer: Medicare (Managed Care) | Admitting: Occupational Therapy

## 2021-06-20 ENCOUNTER — Other Ambulatory Visit: Payer: Self-pay

## 2021-06-20 ENCOUNTER — Encounter: Payer: Self-pay | Admitting: Occupational Therapy

## 2021-06-20 DIAGNOSIS — R600 Localized edema: Secondary | ICD-10-CM

## 2021-06-20 DIAGNOSIS — M25642 Stiffness of left hand, not elsewhere classified: Secondary | ICD-10-CM

## 2021-06-20 DIAGNOSIS — M25622 Stiffness of left elbow, not elsewhere classified: Secondary | ICD-10-CM

## 2021-06-20 DIAGNOSIS — R208 Other disturbances of skin sensation: Secondary | ICD-10-CM

## 2021-06-20 DIAGNOSIS — R2681 Unsteadiness on feet: Secondary | ICD-10-CM

## 2021-06-20 DIAGNOSIS — M25641 Stiffness of right hand, not elsewhere classified: Secondary | ICD-10-CM

## 2021-06-20 DIAGNOSIS — M6281 Muscle weakness (generalized): Secondary | ICD-10-CM

## 2021-06-20 DIAGNOSIS — M25621 Stiffness of right elbow, not elsewhere classified: Secondary | ICD-10-CM

## 2021-06-20 NOTE — Therapy (Signed)
[facility] [address] [place], [place], [address] Phone: [phone]   Fax:  [phone]  Occupational Therapy Treatment  Patient Details  Name: Jose Robinson MRN: [id number] Date of Birth: 03-22-1956 Referring Provider (OT): Tobias Alexander   Encounter Date: 06/20/2021   OT End of Session - 06/20/21 1521     Visit Number 3    Number of Visits 17    Date for OT Re-Evaluation 08/20/21    Authorization Type [employer] Medicare/ Medicaid    OT Start Time 1315    OT Stop Time 1400    OT Time Calculation (min) 45 min    Activity Tolerance Patient tolerated treatment well    Behavior During Therapy WFL for tasks assessed/performed             Past Medical History:  Diagnosis Date   Diabetes mellitus without complication ([place])    ESRD on hemodialysis ([place])    Hypertension     Past Surgical History:  Procedure Laterality Date   BACK SURGERY     BELOW KNEE LEG AMPUTATION      There were no vitals filed for this visit.   Subjective Assessment - 06/20/21 1338     Subjective  Pain in my stomach over past few days    Currently in Pain? Yes    Pain Score 6     Pain Location Abdomen    Pain Orientation Right    Pain Descriptors / Indicators Aching    Pain Type Acute pain    Pain Onset In the past 7 days    Pain Frequency Intermittent    Aggravating Factors  Unsure    Pain Relieving Factors Unsure                          OT Treatments/Exercises (OP) - 06/20/21 0001       ADLs   Grooming Worked with patient with long handled mirrir to allow him to check all aspects of residual limb.  Patient would benefit from a long handled mirrir for skin inspection.    UB Dressing Patient reports that he has assistance for dressing especially on dialysis days where he needs to be dressed early.  Patient reports his aides get shirt over his head and then he places arms in.  He was able to doff pull  over shirt independently, and don with increased time and without assistance (1.04 min)  Patient was instructed to continue to work on putting his shirt on with goal of less than one minute.    LB Dressing Patient has difficulty reaching to left foot to remove his sock.  Dem'd dressing stick and he was able to remove sock and retrieve from floor with ease.  Patient educated in sock aide.  After demo - patient able to don sock with modified independence.  Patient needed assistance to doff right prosthesis - unlock and pull.  Patient was able to remove liner and sock.  He was surprised he could do this without help.  Patient unable to don liner, or sock independently due to poor grip strength and hand dexterity.                    OT Education - 06/20/21 1521     Education Details sock aide, dressing stick, long handled mirror    Person(s) Educated Patient    Methods Explanation;Demonstration;Tactile cues;Verbal cues    Comprehension Verbalized understanding;Returned  demonstration              OT Short Term Goals - 06/20/21 1524       OT SHORT TERM GOAL #1   Title Patient will complete an HEP designed to improve range of motion in bilateral hands, forearms, and elbows    Baseline No HEP    Time 4    Period Weeks    Status On-going    Target Date 07/21/21      OT SHORT TERM GOAL #2   Title Patient will complete an HEP designed to build upper body strength    Baseline No HEP    Time 4    Period Weeks    Status On-going      OT SHORT TERM GOAL #3   Title Patient will don pull over shirt with set up assistance    Baseline per patient mod assist at times    Time 4    Period Weeks    Status Achieved      OT SHORT TERM GOAL #4   Title Patient will verbalize understanding of process to don prosthesis for RLE    Baseline Dependent    Time 4    Period Weeks    Status Achieved      OT SHORT TERM GOAL #5   Title Patient will demonstrate ability to cut food on plate with  set up assistance    Time 4    Period Weeks    Status On-going               OT Long Term Goals - 06/20/21 1524       OT LONG TERM GOAL #1   Title Patient will complete updated HEP for BUE rane, strength and functional use.    Baseline No HEP    Time 8    Period Weeks    Status On-going      OT LONG TERM GOAL #2   Title Patient will don elastic waist shorts or pants with min assist    Baseline dependent    Time 8    Period Weeks    Status On-going      OT LONG TERM GOAL #3   Title Patient will demonstrate 5 lb increase in grip strength in BUE to aide in helping to don/doff prosthesis    Baseline less than 10 lbs    Time 8    Period Weeks    Status On-going      OT LONG TERM GOAL #4   Title Patient will toilet himself with no more than min assist    Baseline total assistance    Time 8    Period Weeks    Status On-going      OT LONG TERM GOAL #5   Title Patient will demonstrate 90% composite flexion in BUE to aide in functional grasp    Baseline 50% left, 75% right    Time 8    Period Weeks    Status On-going                   Plan - 06/20/21 1522     Clinical Impression Statement Pt very motivated to improve independence with ADL to allow him to move out of facility and into a home with intermittent assistance.    OT Occupational Profile and History Detailed Assessment- Review of Records and additional review of physical, cognitive, psychosocial history related to current functional performance    Occupational performance  deficits (Please refer to evaluation for details): ADL's    Body Structure / Function / Physical Skills ADL;Dexterity;Flexibility;ROM;Strength;IADL;FMC;Edema;Coordination;Balance;Body mechanics;Decreased knowledge of precautions;Endurance;Pain;Sensation;UE functional use;Skin integrity;Mobility;GMC;Fascial restriction;Decreased knowledge of use of DME    Rehab Potential Fair    Clinical Decision Making Several treatment options,  min-mod task modification necessary    Comorbidities Affecting Occupational Performance: May have comorbidities impacting occupational performance    Modification or Assistance to Complete Evaluation  Min-Moderate modification of tasks or assist with assess necessary to complete eval    OT Frequency 2x / week    OT Duration 8 weeks    OT Treatment/Interventions Self-care/ADL training;Therapeutic exercise;DME and/or AE instruction;Functional Mobility Training;Balance training;Fluidtherapy;Neuromuscular education;Manual Therapy;Scar mobilization;Splinting;Patient/family education;Therapeutic activities;Passive range of motion    Plan Need to address dressing skills and determine where he can assist with ADL.  Sit to stand, pulling up clothing, need to build grip strength.  May benefit from long handled mirror, elastic laces.    Consulted and Agree with Plan of Care Patient             Patient will benefit from skilled therapeutic intervention in order to improve the following deficits and impairments:   Body Structure / Function / Physical Skills: ADL, Dexterity, Flexibility, ROM, Strength, IADL, FMC, Edema, Coordination, Balance, Body mechanics, Decreased knowledge of precautions, Endurance, Pain, Sensation, UE functional use, Skin integrity, Mobility, GMC, Fascial restriction, Decreased knowledge of use of DME       Visit Diagnosis: Stiffness of left hand, not elsewhere classified  Muscle weakness (generalized)  Stiffness of right hand, not elsewhere classified  Stiffness of left elbow, not elsewhere classified  Stiffness of right elbow, not elsewhere classified  Localized edema  Unsteadiness on feet  Other disturbances of skin sensation    Problem List Patient Active Problem List   Diagnosis Date Noted   Diabetes mellitus type 2, uncontrolled, with complications ([place]) 0000000   Diabetic nephropathy ([place]) 0000000   Chronic systolic CHF (congestive heart failure)  ([place]) 04/16/2021   Hypoglycemia 04/15/2021   Hyperglycemia 04/15/2021   DKA (diabetic ketoacidosis) ([place]) 01/24/2021   COVID-19 virus infection 01/24/2021   Syncope 01/24/2021   ESRD (end stage renal disease) ([place]) 01/24/2021   Congestive heart failure (CHF) ([place]) 01/24/2021   Hypotension 01/24/2021   Shock ([place]) 01/24/2021    Mariah Milling, OTR/L 06/20/2021, 3:25 PM  [facility] [address] [place], [place], [address] Phone: [phone]   Fax:  [phone]  Name: Jose Robinson MRN: [id number] Date of Birth: [birth year]

## 2021-06-22 ENCOUNTER — Ambulatory Visit: Payer: Medicare (Managed Care) | Admitting: Occupational Therapy

## 2021-06-22 ENCOUNTER — Other Ambulatory Visit: Payer: Self-pay

## 2021-06-22 ENCOUNTER — Ambulatory Visit: Payer: Medicare (Managed Care)

## 2021-06-22 DIAGNOSIS — M25642 Stiffness of left hand, not elsewhere classified: Secondary | ICD-10-CM

## 2021-06-22 DIAGNOSIS — M25622 Stiffness of left elbow, not elsewhere classified: Secondary | ICD-10-CM

## 2021-06-22 DIAGNOSIS — R2681 Unsteadiness on feet: Secondary | ICD-10-CM

## 2021-06-22 DIAGNOSIS — R600 Localized edema: Secondary | ICD-10-CM | POA: Diagnosis not present

## 2021-06-22 DIAGNOSIS — M6281 Muscle weakness (generalized): Secondary | ICD-10-CM

## 2021-06-22 DIAGNOSIS — Z89511 Acquired absence of right leg below knee: Secondary | ICD-10-CM

## 2021-06-22 DIAGNOSIS — R2689 Other abnormalities of gait and mobility: Secondary | ICD-10-CM

## 2021-06-22 DIAGNOSIS — M25641 Stiffness of right hand, not elsewhere classified: Secondary | ICD-10-CM

## 2021-06-22 NOTE — Therapy (Addendum)
[facility] [address] [place], [place], [address] Phone: [phone]   Fax:  [phone]  Physical Therapy Evaluation  Patient Details  Name: Jose Robinson MRN: [id number] Date of Birth: 1956/09/27 Referring Provider (PT): Buzzy Han, MD  Encounter Date: 06/22/2021   PT End of Session - 06/22/21 0911     Visit Number 1    Number of Visits 11    Date for PT Re-Evaluation 08/31/21    Authorization Type [employer] Medicare/Medicaid (follow Medicare guidelines)    Progress Note Due on Visit 10    PT Start Time 0900    PT Stop Time 0930    PT Time Calculation (min) 30 min    Equipment Utilized During Treatment Gait belt    Activity Tolerance Patient tolerated treatment well    Behavior During Therapy WFL for tasks assessed/performed             Past Medical History:  Diagnosis Date   Diabetes mellitus without complication ([place])    ESRD on hemodialysis ([place])    Hypertension     Past Surgical History:  Procedure Laterality Date   BACK SURGERY     BELOW KNEE LEG AMPUTATION      There were no vitals filed for this visit.    Subjective Assessment - 06/22/21 0928     Subjective Pt lives in assisted living facility [facility]). PT needs help with putting don/doff clothes, self care activities. He is going to be in assisted care facility for 2 months. He is planning on going home with caregiver assistance. Pt reports he is getting transportation to outpatient therapy from assisted living.    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 no issues    How long can you stand comfortably? <30 sec with walker    How long can you walk comfortably? within his room    Patient Stated Goals Improve balance    Currently in Pain? No/denies                [facility] PT Assessment -  06/22/21 0913       Assessment   Medical Diagnosis Deconditioning    Referring Provider (PT) Buzzy Han, MD    Onset Date/Surgical Date 04/06/21      Precautions   Precautions Fall    Precaution Comments R BKA    Required Braces or Orthoses Other Brace/Splint   R BKA     Restrictions   Weight Bearing Restrictions No      Balance Screen   Has the patient fallen in the past 6 months No      [place] - 2 wheels      Prior Function   Level of Independence Needs assistance with ADLs;Needs assistance with homemaking      Cognition   Overall Cognitive Status Within Functional Limits for tasks assessed      Observation/Other Assessments   Skin Integrity WNL on residula leg      Standardized Balance Assessment   Standardized Balance Assessment Timed Up and Go Test;Berg Balance Test;10 meter walk test    10 Meter Walk 0.42 m/s with RW      Berg Balance Test   Sit to Stand Able to stand  independently using hands    Standing Unsupported Unable to  stand 30 seconds unassisted    Sitting with Back Unsupported but Feet Supported on Floor or Stool Able to sit safely and securely 2 minutes    Stand to Sit Controls descent by using hands    Transfers Able to transfer safely, definite need of hands    Standing Unsupported with Eyes Closed Needs help to keep from falling    Standing Unsupported with Feet Together Needs help to attain position and unable to hold for 15 seconds    From Standing, Reach Forward with Outstretched Arm Loses balance while trying/requires external support    From Standing Position, Pick up Object from Floor Unable to try/needs assist to keep balance    From Standing Position, Turn to Look Behind Over each Shoulder Needs assist to keep from losing balance and falling    Turn 360 Degrees Needs assistance while turning    Standing Unsupported, Alternately Place Feet on Step/Stool  Needs assistance to keep from falling or unable to try    Standing Unsupported, One Foot in [employer] balance while stepping or standing    Standing on One Leg Unable to try or needs assist to prevent fall    Total Score 13      Timed Up and Go Test   Normal TUG (seconds) 43   With RW                       Objective measurements completed on examination: See above findings.                 PT Short Term Goals - 06/22/21 1028       PT SHORT TERM GOAL #1   Title Pt will demo 16/56 on BBS to decrease fall risk and improve walking endurance    Baseline 13/56 (06/22/21)    Time 4    Period Weeks    Status New    Target Date 07/20/21      PT SHORT TERM GOAL #2   Title Pt will demo <38 sec with TUG wit RW to improve functional mobility    Baseline 43 sec with RW (06/22/21)    Time 4    Period Weeks    Status New    Target Date 07/20/21               PT Long Term Goals - 06/22/21 1029       PT LONG TERM GOAL #1   Title Pt will demo at least 0.15 m/s improvement in his gait speed with RW to improve functional mobility    Baseline 0.67ms with RW (06/22/21)    Time 8    Period Weeks    Status New    Target Date 08/17/21      PT LONG TERM GOAL #2   Title Pt will demo <35 sec with TUG with RW to improve functional mobility and decrease fall risk    Baseline 43 sec with RW (06/22/21)    Time 8    Period Weeks    Status New    Target Date 08/17/21      PT LONG TERM GOAL #3   Title Pt will demo >19/56 on BBS to improve functional balance and reduce fall risk    Baseline 13/56 (06/22/21)    Time 8    Period Weeks    Status New    Target Date 08/17/21  Plan - 06/22/21 1036     Clinical Impression Statement Patient is a 65 y.o. male who was seen today for generalized deconditioning and gait and mobility disorder. Patient has hx of R BKA. Objective impairments include decreased generalized fnctional strength,  mobility, and decreased functional balance which is putting patient at very high risk for fall. Patient will benefit from skilled PT to address his functional, strength, mobility, and balance to improve his indpendence (as he is trying to return back to his Independent living with caregier assitance at his home from currently residing in assisted living in 2 months of time) and reduce fall risk.    Personal Factors and Comorbidities Age;Comorbidity 3+    Comorbidities ESRD on HD TTS, CAD, PAD, chronic systolic heart failure, COPD, insulin-dependent diabetes, history of right BKA    Examination-Activity Limitations Bathing;Bed Mobility;Carry;Dressing;Hygiene/Grooming;Lift;Self Feeding;Sleep;Squat;Stairs;Stand;Toileting;Transfers    Examination-Participation Restrictions Cleaning;Community Activity;Laundry;Medication Management;Meal Prep;Occupation;Shop;Church    Stability/Clinical Decision Making Stable/Uncomplicated    Clinical Decision Making Moderate    Rehab Potential Good    PT Frequency 2x / week    PT Duration 8 weeks    PT Treatment/Interventions ADLs/Self Care Home Management;Cryotherapy;Moist Heat;Gait training;Stair training;Functional mobility training;Therapeutic activities;Therapeutic exercise;Balance training;Neuromuscular re-education;Patient/family education;Orthotic Fit/Training;Prosthetic Training;Manual techniques;Scar mobilization;Passive range of motion;Energy conservation;Joint Manipulations    PT Next Visit Plan work on Conservation officer, nature, sit to stand    PT Home Exercise Plan none issued on eval    Consulted and Agree with Plan of Care Patient             Patient will benefit from skilled therapeutic intervention in order to improve the following deficits and impairments:  Abnormal gait, Decreased activity tolerance, Decreased balance, Decreased coordination, Decreased endurance, Decreased mobility, Decreased range of motion, Decreased safety awareness, Difficulty walking,  Decreased strength, Increased fascial restricitons, Impaired flexibility, Improper body mechanics, Postural dysfunction, Prosthetic Dependency  Visit Diagnosis: Hx of right BKA ([place]) - Plan: PT plan of care cert/re-cert  Muscle weakness (generalized) - Plan: PT plan of care cert/re-cert  Unsteadiness on feet - Plan: PT plan of care cert/re-cert  Other abnormalities of gait and mobility - Plan: PT plan of care cert/re-cert     Problem List Patient Active Problem List   Diagnosis Date Noted   Diabetes mellitus type 2, uncontrolled, with complications ([place]) 0000000   Diabetic nephropathy ([place]) 0000000   Chronic systolic CHF (congestive heart failure) ([place]) 04/16/2021   Hypoglycemia 04/15/2021   Hyperglycemia 04/15/2021   DKA (diabetic ketoacidosis) ([place]) 01/24/2021   COVID-19 virus infection 01/24/2021   Syncope 01/24/2021   ESRD (end stage renal disease) ([place]) 01/24/2021   Congestive heart failure (CHF) ([place]) 01/24/2021   Hypotension 01/24/2021   Shock ([place]) 01/24/2021    Kerrie Pleasure, PT 06/22/2021, 10:53 AM  [facility] [address] [place], [place], [address] Phone: [phone]   Fax:  [phone]  Name: MORDCHA BURDETT MRN: [id number] Date of Birth: 09/24/1956

## 2021-06-22 NOTE — Therapy (Signed)
[facility] [address] [place], [place], [address] Phone: [phone]   Fax:  [phone]  Occupational Therapy Treatment  Patient Details  Name: Jose Robinson MRN: [id number] Date of Birth: 1956-07-02 Referring Provider (OT): Tobias Alexander   Encounter Date: 06/22/2021   OT End of Session - 06/22/21 1117     Visit Number 4    Number of Visits 17    Date for OT Re-Evaluation 08/20/21    Authorization Type [employer] Medicare/ Medicaid    OT Start Time 0933    OT Stop Time 1015    OT Time Calculation (min) 42 min    Activity Tolerance Patient tolerated treatment well    Behavior During Therapy WFL for tasks assessed/performed             Past Medical History:  Diagnosis Date   Diabetes mellitus without complication ([place])    ESRD on hemodialysis ([place])    Hypertension     Past Surgical History:  Procedure Laterality Date   BACK SURGERY     BELOW KNEE LEG AMPUTATION      There were no vitals filed for this visit.   Subjective Assessment - 06/22/21 0933     Subjective  Pt continues to report pain in stomach/abdomen. Pt reports he called his doctor and she said if it continues to linger to call her.    Currently in Pain? Yes    Pain Score 5     Pain Location Abdomen    Pain Orientation Right    Pain Descriptors / Indicators Aching    Pain Type Acute pain    Pain Onset In the past 7 days    Pain Frequency Intermittent             LB Dressing to don and doff gym shorts over shorts. Pt req'd min A for getting shorts over LLE shoe. Pt was surprised to know he could do as much as he did during dressing. Pt was able to thread RLE and pull pants up with 1x LOB anteriorly. Pt encouraged to have someone present but to try and do as much as possible for ADLs and dressing with his aid.   Table Slides x 10 for shoulder ROM, BUE  Resistance Clothespins 1-8# with BUE - pt placed red, yellow and green  on antenna with BUE and placed blue and black on horizontal poles with RUE.   Medium Pegs Using LUE to pull medium pegs out of board for increase in coordination and strength.                      OT Short Term Goals - 06/20/21 1524       OT SHORT TERM GOAL #1   Title Patient will complete an HEP designed to improve range of motion in bilateral hands, forearms, and elbows    Baseline No HEP    Time 4    Period Weeks    Status On-going    Target Date 07/21/21      OT SHORT TERM GOAL #2   Title Patient will complete an HEP designed to build upper body strength    Baseline No HEP    Time 4    Period Weeks    Status On-going      OT SHORT TERM GOAL #3   Title Patient will don pull over shirt with set up assistance    Baseline per patient mod assist at times  Time 4    Period Weeks    Status Achieved      OT SHORT TERM GOAL #4   Title Patient will verbalize understanding of process to don prosthesis for RLE    Baseline Dependent    Time 4    Period Weeks    Status Achieved      OT SHORT TERM GOAL #5   Title Patient will demonstrate ability to cut food on plate with set up assistance    Time 4    Period Weeks    Status On-going               OT Long Term Goals - 06/20/21 1524       OT LONG TERM GOAL #1   Title Patient will complete updated HEP for BUE rane, strength and functional use.    Baseline No HEP    Time 8    Period Weeks    Status On-going      OT LONG TERM GOAL #2   Title Patient will don elastic waist shorts or pants with min assist    Baseline dependent    Time 8    Period Weeks    Status On-going      OT LONG TERM GOAL #3   Title Patient will demonstrate 5 lb increase in grip strength in BUE to aide in helping to don/doff prosthesis    Baseline less than 10 lbs    Time 8    Period Weeks    Status On-going      OT LONG TERM GOAL #4   Title Patient will toilet himself with no more than min assist    Baseline total  assistance    Time 8    Period Weeks    Status On-going      OT LONG TERM GOAL #5   Title Patient will demonstrate 90% composite flexion in BUE to aide in functional grasp    Baseline 50% left, 75% right    Time 8    Period Weeks    Status On-going                   Plan - 06/22/21 1128     Clinical Impression Statement Pt surprised by increased ability to don shorts this day. Continue working towards increased independence and grip strength.    OT Occupational Profile and History Detailed Assessment- Review of Records and additional review of physical, cognitive, psychosocial history related to current functional performance    Occupational performance deficits (Please refer to evaluation for details): ADL's    Body Structure / Function / Physical Skills ADL;Dexterity;Flexibility;ROM;Strength;IADL;FMC;Edema;Coordination;Balance;Body mechanics;Decreased knowledge of precautions;Endurance;Pain;Sensation;UE functional use;Skin integrity;Mobility;GMC;Fascial restriction;Decreased knowledge of use of DME    Rehab Potential Fair    Clinical Decision Making Several treatment options, min-mod task modification necessary    Comorbidities Affecting Occupational Performance: May have comorbidities impacting occupational performance    Modification or Assistance to Complete Evaluation  Min-Moderate modification of tasks or assist with assess necessary to complete eval    OT Frequency 2x / week    OT Duration 8 weeks    OT Treatment/Interventions Self-care/ADL training;Therapeutic exercise;DME and/or AE instruction;Functional Mobility Training;Balance training;Fluidtherapy;Neuromuscular education;Manual Therapy;Scar mobilization;Splinting;Patient/family education;Therapeutic activities;Passive range of motion    Plan Need to address dressing skills and determine where he can assist with ADL.  Sit to stand, pulling up clothing, need to build grip strength.  May benefit from long handled  mirror, elastic laces.  Consulted and Agree with Plan of Care Patient             Patient will benefit from skilled therapeutic intervention in order to improve the following deficits and impairments:   Body Structure / Function / Physical Skills: ADL, Dexterity, Flexibility, ROM, Strength, IADL, FMC, Edema, Coordination, Balance, Body mechanics, Decreased knowledge of precautions, Endurance, Pain, Sensation, UE functional use, Skin integrity, Mobility, GMC, Fascial restriction, Decreased knowledge of use of DME       Visit Diagnosis: Muscle weakness (generalized)  Stiffness of left hand, not elsewhere classified  Stiffness of right hand, not elsewhere classified  Stiffness of left elbow, not elsewhere classified  Unsteadiness on feet    Problem List Patient Active Problem List   Diagnosis Date Noted   Diabetes mellitus type 2, uncontrolled, with complications ([place]) 0000000   Diabetic nephropathy ([place]) 0000000   Chronic systolic CHF (congestive heart failure) ([place]) 04/16/2021   Hypoglycemia 04/15/2021   Hyperglycemia 04/15/2021   DKA (diabetic ketoacidosis) ([place]) 01/24/2021   COVID-19 virus infection 01/24/2021   Syncope 01/24/2021   ESRD (end stage renal disease) ([place]) 01/24/2021   Congestive heart failure (CHF) ([place]) 01/24/2021   Hypotension 01/24/2021   Shock ([place]) 01/24/2021    Zachery Conch MOT, OTR/L  06/22/2021, 11:29 AM  [facility] [address] [place], [place], [address] Phone: [phone]   Fax:  [phone]  Name: Jose Robinson MRN: [id number] Date of Birth: 15-Dec-1956

## 2021-06-27 ENCOUNTER — Ambulatory Visit: Payer: Medicare (Managed Care) | Admitting: Occupational Therapy

## 2021-06-27 ENCOUNTER — Other Ambulatory Visit: Payer: Self-pay

## 2021-06-27 DIAGNOSIS — M25621 Stiffness of right elbow, not elsewhere classified: Secondary | ICD-10-CM

## 2021-06-27 DIAGNOSIS — M25642 Stiffness of left hand, not elsewhere classified: Secondary | ICD-10-CM

## 2021-06-27 DIAGNOSIS — M25641 Stiffness of right hand, not elsewhere classified: Secondary | ICD-10-CM

## 2021-06-27 DIAGNOSIS — R2681 Unsteadiness on feet: Secondary | ICD-10-CM

## 2021-06-27 DIAGNOSIS — M6281 Muscle weakness (generalized): Secondary | ICD-10-CM

## 2021-06-27 DIAGNOSIS — R600 Localized edema: Secondary | ICD-10-CM | POA: Diagnosis not present

## 2021-06-27 DIAGNOSIS — M25622 Stiffness of left elbow, not elsewhere classified: Secondary | ICD-10-CM

## 2021-06-27 NOTE — Therapy (Signed)
[facility] [address] [place], [place], [address] Phone: [phone]   Fax:  [phone]  Occupational Therapy Treatment  Patient Details  Name: Jose Robinson MRN: [id number] Date of Birth: [birth year] Referring Provider (OT): Darlina Sicilian, A   Encounter Date: 06/27/2021   OT End of Session - 06/27/21 1524     Visit Number 5    Number of Visits 17    Date for OT Re-Evaluation 08/20/21    Authorization Type [employer] Medicare/ Medicaid    OT Start Time 1442    OT Stop Time 1530    OT Time Calculation (min) 48 min    Activity Tolerance Patient tolerated treatment well    Behavior During Therapy WFL for tasks assessed/performed             Past Medical History:  Diagnosis Date   Diabetes mellitus without complication ([place])    ESRD on hemodialysis ([place])    Hypertension     Past Surgical History:  Procedure Laterality Date   BACK SURGERY     BELOW KNEE LEG AMPUTATION      There were no vitals filed for this visit.   Subjective Assessment - 06/27/21 1620     Subjective  Pt reports no pain today. "I want to be your poster  child"    Currently in Pain? No/denies            ADLs doffed LLE sock and shoe and donned with min A for tying shoes only. Pt req'd verbal cues and visual cues for technique for using adapted equipment. Pt used dressing stick for doffing (pulling down) sock and sock aid for donning socks. Pt demonstrated how to look and monitor for wounds and checking of stump and feet with long handled mirror. Pt issued handout on purchasing information for equipment.  Resistance Clothespins 1-8# with bilateral UE for grip strength and reach for LUE and elbow extension.   Yellow Theraputty - HEP see pt instructions                    OT Education - 06/27/21 1507     Education Details issued handout on purchasing information for hip kit (sock aid and dressing stick), long  handled mirror ([place]). practiced with these tools for LB Dressing (socks and shoes)  yellow theraputty    Person(s) Educated Patient    Methods Explanation;Demonstration;Handout    Comprehension Verbalized understanding;Returned demonstration;Need further instruction              OT Short Term Goals - 06/20/21 1524       OT SHORT TERM GOAL #1   Title Patient will complete an HEP designed to improve range of motion in bilateral hands, forearms, and elbows    Baseline No HEP    Time 4    Period Weeks    Status On-going    Target Date 07/21/21      OT SHORT TERM GOAL #2   Title Patient will complete an HEP designed to build upper body strength    Baseline No HEP    Time 4    Period Weeks    Status On-going      OT SHORT TERM GOAL #3   Title Patient will don pull over shirt with set up assistance    Baseline per patient mod assist at times    Time 4    Period Weeks    Status Achieved      OT  SHORT TERM GOAL #4   Title Patient will verbalize understanding of process to don prosthesis for RLE    Baseline Dependent    Time 4    Period Weeks    Status Achieved      OT SHORT TERM GOAL #5   Title Patient will demonstrate ability to cut food on plate with set up assistance    Time 4    Period Weeks    Status On-going               OT Long Term Goals - 06/20/21 1524       OT LONG TERM GOAL #1   Title Patient will complete updated HEP for BUE rane, strength and functional use.    Baseline No HEP    Time 8    Period Weeks    Status On-going      OT LONG TERM GOAL #2   Title Patient will don elastic waist shorts or pants with min assist    Baseline dependent    Time 8    Period Weeks    Status On-going      OT LONG TERM GOAL #3   Title Patient will demonstrate 5 lb increase in grip strength in BUE to aide in helping to don/doff prosthesis    Baseline less than 10 lbs    Time 8    Period Weeks    Status On-going      OT LONG TERM GOAL #4   Title  Patient will toilet himself with no more than min assist    Baseline total assistance    Time 8    Period Weeks    Status On-going      OT LONG TERM GOAL #5   Title Patient will demonstrate 90% composite flexion in BUE to aide in functional grasp    Baseline 50% left, 75% right    Time 8    Period Weeks    Status On-going                   Plan - 06/27/21 1524     Clinical Impression Statement Pt continues to progress. Pt with good demonstration using adapted equipment for LB dressing this day.    OT Occupational Profile and History Detailed Assessment- Review of Records and additional review of physical, cognitive, psychosocial history related to current functional performance    Occupational performance deficits (Please refer to evaluation for details): ADL's    Body Structure / Function / Physical Skills ADL;Dexterity;Flexibility;ROM;Strength;IADL;FMC;Edema;Coordination;Balance;Body mechanics;Decreased knowledge of precautions;Endurance;Pain;Sensation;UE functional use;Skin integrity;Mobility;GMC;Fascial restriction;Decreased knowledge of use of DME    Rehab Potential Fair    Clinical Decision Making Several treatment options, min-mod task modification necessary    Comorbidities Affecting Occupational Performance: May have comorbidities impacting occupational performance    Modification or Assistance to Complete Evaluation  Min-Moderate modification of tasks or assist with assess necessary to complete eval    OT Frequency 2x / week    OT Duration 8 weeks    OT Treatment/Interventions Self-care/ADL training;Therapeutic exercise;DME and/or AE instruction;Functional Mobility Training;Balance training;Fluidtherapy;Neuromuscular education;Manual Therapy;Scar mobilization;Splinting;Patient/family education;Therapeutic activities;Passive range of motion    Plan Need to address dressing skills and determine where he can assist with ADL.  Sit to stand, pulling up clothing, need to  build grip strength.  May benefit from long handled mirror, elastic laces.    Consulted and Agree with Plan of Care Patient  Patient will benefit from skilled therapeutic intervention in order to improve the following deficits and impairments:   Body Structure / Function / Physical Skills: ADL, Dexterity, Flexibility, ROM, Strength, IADL, FMC, Edema, Coordination, Balance, Body mechanics, Decreased knowledge of precautions, Endurance, Pain, Sensation, UE functional use, Skin integrity, Mobility, GMC, Fascial restriction, Decreased knowledge of use of DME       Visit Diagnosis: Muscle weakness (generalized)  Stiffness of right hand, not elsewhere classified  Stiffness of left elbow, not elsewhere classified  Stiffness of left hand, not elsewhere classified  Stiffness of right elbow, not elsewhere classified  Unsteadiness on feet    Problem List Patient Active Problem List   Diagnosis Date Noted   Diabetes mellitus type 2, uncontrolled, with complications ([place]) 01/65/8006   Diabetic nephropathy ([place]) 34/94/9447   Chronic systolic CHF (congestive heart failure) ([place]) 04/16/2021   Hypoglycemia 04/15/2021   Hyperglycemia 04/15/2021   DKA (diabetic ketoacidosis) ([place]) 01/24/2021   COVID-19 virus infection 01/24/2021   Syncope 01/24/2021   ESRD (end stage renal disease) ([place]) 01/24/2021   Congestive heart failure (CHF) ([place]) 01/24/2021   Hypotension 01/24/2021   Shock ([place]) 01/24/2021    Zachery Conch MOT, OTR/L  06/27/2021, 4:21 PM  [facility] [address] [place], [place], [address] Phone: [phone]   Fax:  [phone]  Name: SRIHARI SHELLHAMMER MRN: [id number] Date of Birth: April 09, 1956

## 2021-06-27 NOTE — Patient Instructions (Signed)
1. Grip Strengthening (Resistive Putty)   Squeeze putty using thumb and all fingers. Repeat _20___ times. Do __2__ sessions per day.   2. Roll putty into tube on table and pinch between each finger and thumb x 10 reps each. (can do ring and small finger together)     Copyright  [employer]. All rights reserved.   

## 2021-06-29 ENCOUNTER — Other Ambulatory Visit: Payer: Self-pay

## 2021-06-29 ENCOUNTER — Ambulatory Visit: Payer: Medicare (Managed Care) | Attending: Family Medicine | Admitting: Occupational Therapy

## 2021-06-29 ENCOUNTER — Encounter: Payer: Self-pay | Admitting: Occupational Therapy

## 2021-06-29 DIAGNOSIS — R600 Localized edema: Secondary | ICD-10-CM | POA: Diagnosis present

## 2021-06-29 DIAGNOSIS — R2689 Other abnormalities of gait and mobility: Secondary | ICD-10-CM | POA: Diagnosis present

## 2021-06-29 DIAGNOSIS — M25622 Stiffness of left elbow, not elsewhere classified: Secondary | ICD-10-CM

## 2021-06-29 DIAGNOSIS — M25641 Stiffness of right hand, not elsewhere classified: Secondary | ICD-10-CM | POA: Diagnosis present

## 2021-06-29 DIAGNOSIS — M6281 Muscle weakness (generalized): Secondary | ICD-10-CM

## 2021-06-29 DIAGNOSIS — Z89511 Acquired absence of right leg below knee: Secondary | ICD-10-CM | POA: Diagnosis present

## 2021-06-29 DIAGNOSIS — R208 Other disturbances of skin sensation: Secondary | ICD-10-CM | POA: Insufficient documentation

## 2021-06-29 DIAGNOSIS — R2681 Unsteadiness on feet: Secondary | ICD-10-CM | POA: Diagnosis present

## 2021-06-29 DIAGNOSIS — M25642 Stiffness of left hand, not elsewhere classified: Secondary | ICD-10-CM | POA: Diagnosis present

## 2021-06-29 DIAGNOSIS — M25621 Stiffness of right elbow, not elsewhere classified: Secondary | ICD-10-CM | POA: Diagnosis present

## 2021-06-29 NOTE — Patient Instructions (Signed)
Access Code: [email or web address] URL: [email or web address] Date: 06/29/2021 Prepared by: Waldo Laine  Exercises Seated Shoulder Horizontal Abduction with Resistance - 1 x daily - 7 x weekly - 3 sets - 10 reps Seated Elbow Flexion with Resistance - 1 x daily - 7 x weekly - 3 sets - 10 reps Seated Shoulder Forward Press with Resistance - 1 x daily - 7 x weekly - 3 sets - 10 reps Seated Scapular Retraction with Resistance - 1 x daily - 7 x weekly - 3 sets - 10 reps Wheelchair Push-Up (AKA) - 1 x daily - 7 x weekly - 3 sets - 10 reps

## 2021-06-29 NOTE — Therapy (Signed)
[facility] [address] [place], [place], [address] Phone: [phone]   Fax:  [phone]  Occupational Therapy Treatment  Patient Details  Name: Jose Robinson MRN: [id number] Date of Birth: 26-Aug-1956 Referring Provider (OT): Tobias Alexander   Encounter Date: 06/29/2021   OT End of Session - 06/29/21 1322     Visit Number 6    Number of Visits 17    Date for OT Re-Evaluation 08/20/21    Authorization Type [employer] Medicare/ Medicaid    OT Start Time 1316    OT Stop Time 1400    OT Time Calculation (min) 44 min    Activity Tolerance Patient tolerated treatment well    Behavior During Therapy WFL for tasks assessed/performed             Past Medical History:  Diagnosis Date   Diabetes mellitus without complication ([place])    ESRD on hemodialysis ([place])    Hypertension     Past Surgical History:  Procedure Laterality Date   BACK SURGERY     BELOW KNEE LEG AMPUTATION      There were no vitals filed for this visit.   Subjective Assessment - 06/29/21 1322     Subjective  Todays a good day, it's a great day. Pt reports wanting to do more "arm strengthening".    Currently in Pain? No/denies              Theraband - yellow x 15 reps  Wheelchair pushups x 15  see pt instructions.                OT Education - 06/29/21 1341     Education Details Yellow Theraband - Access Code: [email or web address]    Person(s) Educated Patient    Methods Explanation;Demonstration;Handout    Comprehension Verbalized understanding;Returned demonstration;Need further instruction              OT Short Term Goals - 06/20/21 1524       OT SHORT TERM GOAL #1   Title Patient will complete an HEP designed to improve range of motion in bilateral hands, forearms, and elbows    Baseline No HEP    Time 4    Period Weeks    Status On-going    Target Date 07/21/21      OT SHORT TERM GOAL #2   Title  Patient will complete an HEP designed to build upper body strength    Baseline No HEP    Time 4    Period Weeks    Status On-going      OT SHORT TERM GOAL #3   Title Patient will don pull over shirt with set up assistance    Baseline per patient mod assist at times    Time 4    Period Weeks    Status Achieved      OT SHORT TERM GOAL #4   Title Patient will verbalize understanding of process to don prosthesis for RLE    Baseline Dependent    Time 4    Period Weeks    Status Achieved      OT SHORT TERM GOAL #5   Title Patient will demonstrate ability to cut food on plate with set up assistance    Time 4    Period Weeks    Status On-going               OT Long Term Goals - 06/20/21 1524  OT LONG TERM GOAL #1   Title Patient will complete updated HEP for BUE rane, strength and functional use.    Baseline No HEP    Time 8    Period Weeks    Status On-going      OT LONG TERM GOAL #2   Title Patient will don elastic waist shorts or pants with min assist    Baseline dependent    Time 8    Period Weeks    Status On-going      OT LONG TERM GOAL #3   Title Patient will demonstrate 5 lb increase in grip strength in BUE to aide in helping to don/doff prosthesis    Baseline less than 10 lbs    Time 8    Period Weeks    Status On-going      OT LONG TERM GOAL #4   Title Patient will toilet himself with no more than min assist    Baseline total assistance    Time 8    Period Weeks    Status On-going      OT LONG TERM GOAL #5   Title Patient will demonstrate 90% composite flexion in BUE to aide in functional grasp    Baseline 50% left, 75% right    Time 8    Period Weeks    Status On-going                   Plan - 06/29/21 1358     Clinical Impression Statement Pt progressing towards goals. Pt continues to benefit from OT for strengthening, range of motion and increased independence.    OT Occupational Profile and History Detailed Assessment-  Review of Records and additional review of physical, cognitive, psychosocial history related to current functional performance    Occupational performance deficits (Please refer to evaluation for details): ADL's    Body Structure / Function / Physical Skills ADL;Dexterity;Flexibility;ROM;Strength;IADL;FMC;Edema;Coordination;Balance;Body mechanics;Decreased knowledge of precautions;Endurance;Pain;Sensation;UE functional use;Skin integrity;Mobility;GMC;Fascial restriction;Decreased knowledge of use of DME    Rehab Potential Fair    Clinical Decision Making Several treatment options, min-mod task modification necessary    Comorbidities Affecting Occupational Performance: May have comorbidities impacting occupational performance    Modification or Assistance to Complete Evaluation  Min-Moderate modification of tasks or assist with assess necessary to complete eval    OT Frequency 2x / week    OT Duration 8 weeks    OT Treatment/Interventions Self-care/ADL training;Therapeutic exercise;DME and/or AE instruction;Functional Mobility Training;Balance training;Fluidtherapy;Neuromuscular education;Manual Therapy;Scar mobilization;Splinting;Patient/family education;Therapeutic activities;Passive range of motion    Plan Need to address dressing skills and determine where he can assist with ADL.  Sit to stand, pulling up clothing, need to build grip strength.  May benefit from long handled mirror, elastic laces.    Consulted and Agree with Plan of Care Patient             Patient will benefit from skilled therapeutic intervention in order to improve the following deficits and impairments:   Body Structure / Function / Physical Skills: ADL, Dexterity, Flexibility, ROM, Strength, IADL, FMC, Edema, Coordination, Balance, Body mechanics, Decreased knowledge of precautions, Endurance, Pain, Sensation, UE functional use, Skin integrity, Mobility, GMC, Fascial restriction, Decreased knowledge of use of DME        Visit Diagnosis: Muscle weakness (generalized)  Stiffness of right hand, not elsewhere classified  Stiffness of left elbow, not elsewhere classified  Stiffness of right elbow, not elsewhere classified  Stiffness of left hand, not elsewhere classified  Unsteadiness  on feet    Problem List Patient Active Problem List   Diagnosis Date Noted   Diabetes mellitus type 2, uncontrolled, with complications ([place]) 0000000   Diabetic nephropathy ([place]) 0000000   Chronic systolic CHF (congestive heart failure) ([place]) 04/16/2021   Hypoglycemia 04/15/2021   Hyperglycemia 04/15/2021   DKA (diabetic ketoacidosis) ([place]) 01/24/2021   COVID-19 virus infection 01/24/2021   Syncope 01/24/2021   ESRD (end stage renal disease) ([place]) 01/24/2021   Congestive heart failure (CHF) ([place]) 01/24/2021   Hypotension 01/24/2021   Shock ([place]) 01/24/2021    Zachery Conch MOT, OTR/L  06/29/2021, 1:59 PM  [facility] [address] [place], [place], [address] Phone: [phone]   Fax:  [phone]  Name: Jose Robinson MRN: [id number] Date of Birth: 30-Nov-1956

## 2021-07-04 ENCOUNTER — Other Ambulatory Visit: Payer: Self-pay

## 2021-07-04 ENCOUNTER — Ambulatory Visit: Payer: Medicare (Managed Care) | Admitting: Occupational Therapy

## 2021-07-04 DIAGNOSIS — R2681 Unsteadiness on feet: Secondary | ICD-10-CM

## 2021-07-04 DIAGNOSIS — M6281 Muscle weakness (generalized): Secondary | ICD-10-CM | POA: Diagnosis not present

## 2021-07-04 NOTE — Therapy (Signed)
[facility] [address] [place], [place], [address] Phone: [phone]   Fax:  [phone]  Occupational Therapy Treatment  Patient Details  Name: Jose Robinson MRN: [id number] Date of Birth: 08/23/1956 Referring Provider (OT): Tobias Alexander   Encounter Date: 07/04/2021   OT End of Session - 07/04/21 1732     Visit Number 7    Number of Visits 17    Date for OT Re-Evaluation 08/20/21    Authorization Type [employer] Medicare/ Medicaid    OT Start Time [id number]    OT Stop Time 1620    OT Time Calculation (min) 45 min    Activity Tolerance Patient tolerated treatment well    Behavior During Therapy WFL for tasks assessed/performed             Past Medical History:  Diagnosis Date   Diabetes mellitus without complication ([place])    ESRD on hemodialysis ([place])    Hypertension     Past Surgical History:  Procedure Laterality Date   BACK SURGERY     BELOW KNEE LEG AMPUTATION      There were no vitals filed for this visit.   Subjective Assessment - 07/04/21 1540     Subjective  I don't have much hand strength    Pertinent History Rt BKA, DM, neuropathy    Currently in Pain? No/denies              Practiced LE dressing with A/E including dressing stick to doff Lt sock, sock aide to don sock w/ mod assist required d/t hand weakness (to get sock over sock aide and to pull up), and shoe horn to don shoe w/ mod assist to get shoe horn to very back of heel. Dependent for tying shoes. Pt also simulated donning shorts w/ reacher with difficulty grasping handle/trigger of reacher d/t hand weakness. Recommended shorter reacher and sock aide w/ handles for easier grip. Also discussed/reviewed recommendation of LH bendable mirror to check bottom of Lt foot and elastic shoe laces. Simulated donning/doffing pants over hips in standing with close supervision  W/C push ups x 10. Yellow theraband for rows and horizontal  abd x 10 reps each.                       OT Short Term Goals - 06/20/21 1524       OT SHORT TERM GOAL #1   Title Patient will complete an HEP designed to improve range of motion in bilateral hands, forearms, and elbows    Baseline No HEP    Time 4    Period Weeks    Status On-going    Target Date 07/21/21      OT SHORT TERM GOAL #2   Title Patient will complete an HEP designed to build upper body strength    Baseline No HEP    Time 4    Period Weeks    Status On-going      OT SHORT TERM GOAL #3   Title Patient will don pull over shirt with set up assistance    Baseline per patient mod assist at times    Time 4    Period Weeks    Status Achieved      OT SHORT TERM GOAL #4   Title Patient will verbalize understanding of process to don prosthesis for RLE    Baseline Dependent    Time 4    Period Weeks  Status Achieved      OT SHORT TERM GOAL #5   Title Patient will demonstrate ability to cut food on plate with set up assistance    Time 4    Period Weeks    Status On-going               OT Long Term Goals - 06/20/21 1524       OT LONG TERM GOAL #1   Title Patient will complete updated HEP for BUE rane, strength and functional use.    Baseline No HEP    Time 8    Period Weeks    Status On-going      OT LONG TERM GOAL #2   Title Patient will don elastic waist shorts or pants with min assist    Baseline dependent    Time 8    Period Weeks    Status On-going      OT LONG TERM GOAL #3   Title Patient will demonstrate 5 lb increase in grip strength in BUE to aide in helping to don/doff prosthesis    Baseline less than 10 lbs    Time 8    Period Weeks    Status On-going      OT LONG TERM GOAL #4   Title Patient will toilet himself with no more than min assist    Baseline total assistance    Time 8    Period Weeks    Status On-going      OT LONG TERM GOAL #5   Title Patient will demonstrate 90% composite flexion in BUE to aide  in functional grasp    Baseline 50% left, 75% right    Time 8    Period Weeks    Status On-going                   Plan - 07/04/21 1733     Clinical Impression Statement Pt progressing towards goals. Pt continues to benefit from OT for strengthening, range of motion and increased independence.    OT Occupational Profile and History Detailed Assessment- Review of Records and additional review of physical, cognitive, psychosocial history related to current functional performance    Occupational performance deficits (Please refer to evaluation for details): ADL's    Body Structure / Function / Physical Skills ADL;Dexterity;Flexibility;ROM;Strength;IADL;FMC;Edema;Coordination;Balance;Body mechanics;Decreased knowledge of precautions;Endurance;Pain;Sensation;UE functional use;Skin integrity;Mobility;GMC;Fascial restriction;Decreased knowledge of use of DME    Rehab Potential Fair    Clinical Decision Making Several treatment options, min-mod task modification necessary    Comorbidities Affecting Occupational Performance: May have comorbidities impacting occupational performance    Modification or Assistance to Complete Evaluation  Min-Moderate modification of tasks or assist with assess necessary to complete eval    OT Frequency 2x / week    OT Duration 8 weeks    OT Treatment/Interventions Self-care/ADL training;Therapeutic exercise;DME and/or AE instruction;Functional Mobility Training;Balance training;Fluidtherapy;Neuromuscular education;Manual Therapy;Scar mobilization;Splinting;Patient/family education;Therapeutic activities;Passive range of motion    Plan work on hand strength (to use sock aide and for functional tasks), standing balance with prosthetic    Consulted and Agree with Plan of Care Patient             Patient will benefit from skilled therapeutic intervention in order to improve the following deficits and impairments:   Body Structure / Function / Physical Skills:  ADL, Dexterity, Flexibility, ROM, Strength, IADL, FMC, Edema, Coordination, Balance, Body mechanics, Decreased knowledge of precautions, Endurance, Pain, Sensation, UE functional use, Skin integrity, Mobility, GMC, Fascial  restriction, Decreased knowledge of use of DME       Visit Diagnosis: Muscle weakness (generalized)  Unsteadiness on feet    Problem List Patient Active Problem List   Diagnosis Date Noted   Diabetes mellitus type 2, uncontrolled, with complications ([facility]) 0000000   Diabetic nephropathy ([place]) 0000000   Chronic systolic CHF (congestive heart failure) ([place]) 04/16/2021   Hypoglycemia 04/15/2021   Hyperglycemia 04/15/2021   DKA (diabetic ketoacidosis) ([place]) 01/24/2021   COVID-19 virus infection 01/24/2021   Syncope 01/24/2021   ESRD (end stage renal disease) ([place]) 01/24/2021   Congestive heart failure (CHF) ([place]) 01/24/2021   Hypotension 01/24/2021   Shock ([place]) 01/24/2021    Carey Bullocks, OTR/L 07/04/2021, 5:34 PM  [facility] [address] [place], [place], [address] Phone: [phone]   Fax:  [phone]  Name: Jose Robinson MRN: [id number] Date of Birth: [birth year]

## 2021-07-06 ENCOUNTER — Encounter: Payer: Self-pay | Admitting: Occupational Therapy

## 2021-07-06 ENCOUNTER — Other Ambulatory Visit: Payer: Self-pay

## 2021-07-06 ENCOUNTER — Ambulatory Visit: Payer: Medicare (Managed Care) | Admitting: Occupational Therapy

## 2021-07-06 DIAGNOSIS — M25641 Stiffness of right hand, not elsewhere classified: Secondary | ICD-10-CM

## 2021-07-06 DIAGNOSIS — M25622 Stiffness of left elbow, not elsewhere classified: Secondary | ICD-10-CM

## 2021-07-06 DIAGNOSIS — M25621 Stiffness of right elbow, not elsewhere classified: Secondary | ICD-10-CM

## 2021-07-06 DIAGNOSIS — R2681 Unsteadiness on feet: Secondary | ICD-10-CM

## 2021-07-06 DIAGNOSIS — M25642 Stiffness of left hand, not elsewhere classified: Secondary | ICD-10-CM

## 2021-07-06 DIAGNOSIS — M6281 Muscle weakness (generalized): Secondary | ICD-10-CM | POA: Diagnosis not present

## 2021-07-06 NOTE — Therapy (Signed)
[facility] [address] [place], [place], [address] Phone: [phone]   Fax:  [phone]  Occupational Therapy Treatment  Patient Details  Name: Jose Robinson MRN: [id number] Date of Birth: [birth year] Referring Provider (OT): Tobias Alexander   Encounter Date: 07/06/2021   OT End of Session - 07/06/21 0938     Visit Number 8    Number of Visits 17    Date for OT Re-Evaluation 08/20/21    Authorization Type [employer] Medicare/ Medicaid    OT Start Time 0932    OT Stop Time 1015    OT Time Calculation (min) 43 min    Activity Tolerance Patient tolerated treatment well    Behavior During Therapy WFL for tasks assessed/performed             Past Medical History:  Diagnosis Date   Diabetes mellitus without complication ([place])    ESRD on hemodialysis ([facility] [place])    Hypertension     Past Surgical History:  Procedure Laterality Date   BACK SURGERY     BELOW KNEE LEG AMPUTATION      There were no vitals filed for this visit.   Subjective Assessment - 07/06/21 0937     Subjective  "I want to be one of your early ones when you have your coffee"    Pertinent History Rt BKA, DM, neuropathy    Currently in Pain? No/denies             Pt received in lobby. Pt seated in wheelchair.  Wheelchair Push Ups x 10 reps  Transfer w/c <> arm bike with [facility] assist and min A support.  Arm Bike: for conditioning and reciprocal movements on Level 2 for 6 minutes (forward and backward)  Fluidotherapy x 10 minutes for BUE to address stiffness. No adverse reactions. AROM for composite flexion and extension in BUE while modality was running.                      OT Short Term Goals - 06/20/21 1524       OT SHORT TERM GOAL #1   Title Patient will complete an HEP designed to improve range of motion in bilateral hands, forearms, and elbows    Baseline No HEP    Time 4    Period Weeks    Status  On-going    Target Date 07/21/21      OT SHORT TERM GOAL #2   Title Patient will complete an HEP designed to build upper body strength    Baseline No HEP    Time 4    Period Weeks    Status On-going      OT SHORT TERM GOAL #3   Title Patient will don pull over shirt with set up assistance    Baseline per patient mod assist at times    Time 4    Period Weeks    Status Achieved      OT SHORT TERM GOAL #4   Title Patient will verbalize understanding of process to don prosthesis for RLE    Baseline Dependent    Time 4    Period Weeks    Status Achieved      OT SHORT TERM GOAL #5   Title Patient will demonstrate ability to cut food on plate with set up assistance    Time 4    Period Weeks    Status On-going  OT Long Term Goals - 06/20/21 1524       OT LONG TERM GOAL #1   Title Patient will complete updated HEP for BUE rane, strength and functional use.    Baseline No HEP    Time 8    Period Weeks    Status On-going      OT LONG TERM GOAL #2   Title Patient will don elastic waist shorts or pants with min assist    Baseline dependent    Time 8    Period Weeks    Status On-going      OT LONG TERM GOAL #3   Title Patient will demonstrate 5 lb increase in grip strength in BUE to aide in helping to don/doff prosthesis    Baseline less than 10 lbs    Time 8    Period Weeks    Status On-going      OT LONG TERM GOAL #4   Title Patient will toilet himself with no more than min assist    Baseline total assistance    Time 8    Period Weeks    Status On-going      OT LONG TERM GOAL #5   Title Patient will demonstrate 90% composite flexion in BUE to aide in functional grasp    Baseline 50% left, 75% right    Time 8    Period Weeks    Status On-going                   Plan - 07/06/21 1133     Clinical Impression Statement Pt continues to progress towards goals. Benefitting from strenthening, ADL reeducation and modalities for stiffness.     OT Occupational Profile and History Detailed Assessment- Review of Records and additional review of physical, cognitive, psychosocial history related to current functional performance    Occupational performance deficits (Please refer to evaluation for details): ADL's    Body Structure / Function / Physical Skills ADL;Dexterity;Flexibility;ROM;Strength;IADL;FMC;Edema;Coordination;Balance;Body mechanics;Decreased knowledge of precautions;Endurance;Pain;Sensation;UE functional use;Skin integrity;Mobility;GMC;Fascial restriction;Decreased knowledge of use of DME    Rehab Potential Fair    Clinical Decision Making Several treatment options, min-mod task modification necessary    Comorbidities Affecting Occupational Performance: May have comorbidities impacting occupational performance    Modification or Assistance to Complete Evaluation  Min-Moderate modification of tasks or assist with assess necessary to complete eval    OT Frequency 2x / week    OT Duration 8 weeks    OT Treatment/Interventions Self-care/ADL training;Therapeutic exercise;DME and/or AE instruction;Functional Mobility Training;Balance training;Fluidtherapy;Neuromuscular education;Manual Therapy;Scar mobilization;Splinting;Patient/family education;Therapeutic activities;Passive range of motion    Plan work on hand strength (to use sock aide and for functional tasks), standing balance with prosthetic    Consulted and Agree with Plan of Care Patient             Patient will benefit from skilled therapeutic intervention in order to improve the following deficits and impairments:   Body Structure / Function / Physical Skills: ADL, Dexterity, Flexibility, ROM, Strength, IADL, FMC, Edema, Coordination, Balance, Body mechanics, Decreased knowledge of precautions, Endurance, Pain, Sensation, UE functional use, Skin integrity, Mobility, GMC, Fascial restriction, Decreased knowledge of use of DME       Visit Diagnosis: Muscle  weakness (generalized)  Unsteadiness on feet  Stiffness of right hand, not elsewhere classified  Stiffness of left elbow, not elsewhere classified  Stiffness of right elbow, not elsewhere classified  Stiffness of left hand, not elsewhere classified    Problem List Patient  Active Problem List   Diagnosis Date Noted   Diabetes mellitus type 2, uncontrolled, with complications ([place]) 0000000   Diabetic nephropathy ([place]) 0000000   Chronic systolic CHF (congestive heart failure) ([place]) 04/16/2021   Hypoglycemia 04/15/2021   Hyperglycemia 04/15/2021   DKA (diabetic ketoacidosis) ([place]) 01/24/2021   COVID-19 virus infection 01/24/2021   Syncope 01/24/2021   ESRD (end stage renal disease) ([place]) 01/24/2021   Congestive heart failure (CHF) ([place]) 01/24/2021   Hypotension 01/24/2021   Shock ([place]) 01/24/2021    Zachery Conch MOT, OTR/L  07/06/2021, 11:34 AM  [facility] [address] [place], [place], [address] Phone: [phone]   Fax:  [phone]  Name: ASANTI VILLA MRN: [id number] Date of Birth: [birth year]

## 2021-07-11 ENCOUNTER — Ambulatory Visit: Payer: Medicare (Managed Care)

## 2021-07-11 ENCOUNTER — Encounter: Payer: Self-pay | Admitting: Occupational Therapy

## 2021-07-11 ENCOUNTER — Ambulatory Visit: Payer: Medicare (Managed Care) | Admitting: Occupational Therapy

## 2021-07-11 ENCOUNTER — Other Ambulatory Visit: Payer: Self-pay

## 2021-07-11 DIAGNOSIS — M25642 Stiffness of left hand, not elsewhere classified: Secondary | ICD-10-CM

## 2021-07-11 DIAGNOSIS — M6281 Muscle weakness (generalized): Secondary | ICD-10-CM

## 2021-07-11 DIAGNOSIS — R600 Localized edema: Secondary | ICD-10-CM

## 2021-07-11 DIAGNOSIS — R2689 Other abnormalities of gait and mobility: Secondary | ICD-10-CM

## 2021-07-11 DIAGNOSIS — R2681 Unsteadiness on feet: Secondary | ICD-10-CM

## 2021-07-11 DIAGNOSIS — M25621 Stiffness of right elbow, not elsewhere classified: Secondary | ICD-10-CM

## 2021-07-11 DIAGNOSIS — R208 Other disturbances of skin sensation: Secondary | ICD-10-CM

## 2021-07-11 DIAGNOSIS — M25641 Stiffness of right hand, not elsewhere classified: Secondary | ICD-10-CM

## 2021-07-11 DIAGNOSIS — M25622 Stiffness of left elbow, not elsewhere classified: Secondary | ICD-10-CM

## 2021-07-11 NOTE — Patient Instructions (Signed)
Access Code: [email or web address] URL: [email or web address] Date: 07/11/2021 Prepared by: Baldomero Lamy  Exercises Sit to Stand with Armchair - 1 x daily - 5 x weekly - 1 sets - 10 reps Side to Side Weight Shift with Counter Support - 1 x daily - 5 x weekly - 1 sets - 10 reps Forward Backward Weight Shift with Counter Support - 1 x daily - 5 x weekly - 1 sets - 10 reps

## 2021-07-11 NOTE — Therapy (Signed)
[facility] [address] [place], [place], [address] Phone: [phone]   Fax:  [phone]  Physical Therapy Treatment  Patient Details  Name: Jose Robinson MRN: [id number] Date of Birth: 10-11-1956 Referring Provider (PT): Buzzy Han, MD   Encounter Date: 07/11/2021   PT End of Session - 07/11/21 1322     Visit Number 2    Number of Visits 11    Date for PT Re-Evaluation 08/31/21    Authorization Type [employer] Medicare/Medicaid (follow Medicare guidelines)    Progress Note Due on Visit 10    PT Start Time 1317    PT Stop Time 1400    PT Time Calculation (min) 43 min    Equipment Utilized During Treatment Gait belt    Activity Tolerance Patient tolerated treatment well    Behavior During Therapy WFL for tasks assessed/performed             Past Medical History:  Diagnosis Date   Diabetes mellitus without complication ([place])    ESRD on hemodialysis ([facility])    Hypertension     Past Surgical History:  Procedure Laterality Date   BACK SURGERY     BELOW KNEE LEG AMPUTATION      There were no vitals filed for this visit.   Subjective Assessment - 07/11/21 1323     Subjective Patient denies changes since initial evaluation. Denies falls.    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 no issues    How long can you stand comfortably? <30 sec with walker    How long can you walk comfortably? within his room    Patient Stated Goals Improve balance    Currently in Pain? No/denies    Pain Onset In the past 7 days                 Prosthetics Assessment - 07/11/21 0001       Prosthetics   Current prosthetic wear tolerance (#hours/day)  all waking hours, 7x/week    Residual limb condition  denies skin issues              [facility] Adult PT Treatment/Exercise -  07/11/21 0001       Transfers   Transfers Sit to Stand;Stand to Sit    Number of Reps 10 reps;1 set    Comments x 10 reps at countertop, focus on standing up tall with proper posture. Added to HEP.      Ambulation/Gait   Ambulation/Gait Yes    Ambulation/Gait Assistance 4: Min guard    Ambulation/Gait Assistance Details Completed gait training with RW 115 ft x 2 reps. CGA throughout. PT providing cues for step length as well as upright posture with ambulation. Rest break required between gait trial due to fatigue.    Ambulation Distance (Feet) 115 Feet   x 2   Assistive device Rolling walker   R Prosthetic   Ambulation Surface Level;Indoor      Neuro Re-ed    Neuro Re-ed Details  Standing at countertop with  BUE support: completed lateral weight shift R/L x 10 reps with focus on engaging glute to maintain hip extension with weight shift onto RLE. Then completed A/P weight shift, initially difficult with posterior weight shift but able to demo improvements with cues and faciliation at pelvis. Completed lateral stepping to R x 10 reps, followed by to  L x 10 reps to promote stance phase and balance. Slowly progressed from BUE support to single UE support to further challenge balance. HEP initiated. See medbridge for details.            Established Initial HEP:  Access Code: [email or web address] URL: [email or web address] Date: 07/11/2021 Prepared by: Baldomero Lamy  Exercises Sit to Stand with Armchair - 1 x daily - 5 x weekly - 1 sets - 10 reps Side to Side Weight Shift with Counter Support - 1 x daily - 5 x weekly - 1 sets - 10 reps Forward Backward Weight Shift with Counter Support - 1 x daily - 5 x weekly - 1 sets - 10 reps        PT Education - 07/11/21 1324     Education Details Educated on Initial HEP    Person(s) Educated Patient    Methods Explanation;Demonstration;Handout    Comprehension Verbalized understanding;Returned demonstration              PT  Short Term Goals - 06/22/21 1028       PT SHORT TERM GOAL #1   Title Pt will demo 16/56 on BBS to decrease fall risk and improve walking endurance    Baseline 13/56 (06/22/21)    Time 4    Period Weeks    Status New    Target Date 07/20/21      PT SHORT TERM GOAL #2   Title Pt will demo <38 sec with TUG wit RW to improve functional mobility    Baseline 43 sec with RW (06/22/21)    Time 4    Period Weeks    Status New    Target Date 07/20/21               PT Long Term Goals - 06/22/21 1029       PT LONG TERM GOAL #1   Title Pt will demo at least 0.15 m/s improvement in his gait speed with RW to improve functional mobility    Baseline 0.27ms with RW (06/22/21)    Time 8    Period Weeks    Status New    Target Date 08/17/21      PT LONG TERM GOAL #2   Title Pt will demo <35 sec with TUG with RW to improve functional mobility and decrease fall risk    Baseline 43 sec with RW (06/22/21)    Time 8    Period Weeks    Status New    Target Date 08/17/21      PT LONG TERM GOAL #3   Title Pt will demo >19/56 on BBS to improve functional balance and reduce fall risk    Baseline 13/56 (06/22/21)    Time 8    Period Weeks    Status New    Target Date 08/17/21                   Plan - 07/11/21 1800     Clinical Impression Statement Today's skilled PT session focused on continued gait training with RW, patietn continue to demo decreased endurance. Patient able to ambulate approx 115 ft prior to needing seated rest break at this time. Rest of session focused on establishing initial HEP focused on standing balance and sit <> stand with patient tolerating well. WIll continue to progress toward all LTGs.    Personal Factors and Comorbidities Age;Comorbidity 3+    Comorbidities ESRD on HD TTS, CAD, PAD, chronic systolic heart failure, COPD,  insulin-dependent diabetes, history of right BKA    Examination-Activity Limitations Bathing;Bed  Mobility;Carry;Dressing;Hygiene/Grooming;Lift;Self Feeding;Sleep;Squat;Stairs;Stand;Toileting;Transfers    Examination-Participation Restrictions Cleaning;Community Activity;Laundry;Medication Management;Meal Prep;Occupation;Shop;Church    Stability/Clinical Decision Making Stable/Uncomplicated    Rehab Potential Good    PT Frequency 2x / week    PT Duration 8 weeks    PT Treatment/Interventions ADLs/Self Care Home Management;Cryotherapy;Moist Heat;Gait training;Stair training;Functional mobility training;Therapeutic activities;Therapeutic exercise;Balance training;Neuromuscular re-education;Patient/family education;Orthotic Fit/Training;Prosthetic Training;Manual techniques;Scar mobilization;Passive range of motion;Energy conservation;Joint Manipulations    PT Next Visit Plan How was HEP? continue gait training focused on ireased ambulation distance. Static Standing Balance, Weight Shift Activities    PT Home Exercise Plan Access Code: [email or web address]    Consulted and Agree with Plan of Care Patient             Patient will benefit from skilled therapeutic intervention in order to improve the following deficits and impairments:  Abnormal gait, Decreased activity tolerance, Decreased balance, Decreased coordination, Decreased endurance, Decreased mobility, Decreased range of motion, Decreased safety awareness, Difficulty walking, Decreased strength, Increased fascial restricitons, Impaired flexibility, Improper body mechanics, Postural dysfunction, Prosthetic Dependency  Visit Diagnosis: Unsteadiness on feet  Muscle weakness (generalized)  Other abnormalities of gait and mobility     Problem List Patient Active Problem List   Diagnosis Date Noted   Diabetes mellitus type 2, uncontrolled, with complications ([place]) 0000000   Diabetic nephropathy ([place]) 0000000   Chronic systolic CHF (congestive heart failure) ([place]) 04/16/2021   Hypoglycemia 04/15/2021   Hyperglycemia 04/15/2021   DKA  (diabetic ketoacidosis) ([facility]) 01/24/2021   COVID-19 virus infection 01/24/2021   Syncope 01/24/2021   ESRD (end stage renal disease) ([place]) 01/24/2021   Congestive heart failure (CHF) ([facility]) 01/24/2021   Hypotension 01/24/2021   Shock ([place]) 01/24/2021    Jones Bales, PT, DPT 07/11/2021, 6:04 PM  [facility] [address] [place], [place], [address] Phone: [phone]   Fax:  [phone]  Name: LABAN MCCLAUGHRY MRN: [id number] Date of Birth: [birth year]

## 2021-07-11 NOTE — Therapy (Signed)
[facility] [address] [place], [place], [address] Phone: [phone]   Fax:  [phone]  Occupational Therapy Treatment  Patient Details  Name: Jose Robinson MRN: [id number] Date of Birth: 1956/10/02 Referring Provider (OT): Tobias Alexander   Encounter Date: 07/11/2021   OT End of Session - 07/11/21 1458     Visit Number 9    Number of Visits 17    Date for OT Re-Evaluation 08/20/21    Authorization Type [employer] Medicare/ Medicaid    OT Start Time 1233    OT Stop Time 1315    OT Time Calculation (min) 42 min    Activity Tolerance Patient tolerated treatment well    Behavior During Therapy WFL for tasks assessed/performed             Past Medical History:  Diagnosis Date   Diabetes mellitus without complication ([place])    ESRD on hemodialysis ([place])    Hypertension     Past Surgical History:  Procedure Laterality Date   BACK SURGERY     BELOW KNEE LEG AMPUTATION      There were no vitals filed for this visit.   Subjective Assessment - 07/11/21 1238     Subjective  I feel like my arms are getting  stronger    Pertinent History Rt BKA, DM, neuropathy    Currently in Pain? No/denies    Pain Score 0-No pain                          OT Treatments/Exercises (OP) - 07/11/21 0001       ADLs   Eating Reviewed adaptive knives for cutting food on plate.  Patient with best result with t-shaped rocker knife - given resource to purchase.    LB Dressing Discussed obtaining dressing stick to aide with pulling pants up in bathroom. Showed patient how dressing stick could attach to walker and then available when and where needed.    ADL Comments Patient issued built up foam roll red and blue to allow him to build up eating and grooming utensils.  [place]an to assist with grip on handles, and walker bag to increase functional ambulation and safety.                    OT Education -  07/11/21 1456     Education Details walker bag, rocker knife, built up foam    Person(s) Educated Patient    Methods Explanation;Demonstration    Comprehension Verbalized understanding;Returned demonstration              OT Short Term Goals - 07/11/21 1500       OT SHORT TERM GOAL #1   Title Patient will complete an HEP designed to improve range of motion in bilateral hands, forearms, and elbows    Baseline No HEP    Time 4    Period Weeks    Status On-going    Target Date 07/21/21      OT SHORT TERM GOAL #2   Title Patient will complete an HEP designed to build upper body strength    Baseline No HEP    Time 4    Period Weeks    Status Achieved      OT SHORT TERM GOAL #3   Title Patient will don pull over shirt with set up assistance    Baseline per patient mod assist at times    Time  4    Period Weeks    Status Achieved      OT SHORT TERM GOAL #4   Title Patient will verbalize understanding of process to don prosthesis for RLE    Baseline Dependent    Time 4    Period Weeks    Status Achieved      OT SHORT TERM GOAL #5   Title Patient will demonstrate ability to cut food on plate with set up assistance    Time 4    Period Weeks    Status On-going               OT Long Term Goals - 07/11/21 1501       OT LONG TERM GOAL #1   Title Patient will complete updated HEP for BUE rane, strength and functional use.    Baseline No HEP    Time 8    Period Weeks    Status On-going      OT LONG TERM GOAL #2   Title Patient will don elastic waist shorts or pants with min assist    Baseline dependent    Time 8    Period Weeks    Status On-going      OT LONG TERM GOAL #3   Title Patient will demonstrate 5 lb increase in grip strength in BUE to aide in helping to don/doff prosthesis    Baseline less than 10 lbs    Time 8    Period Weeks    Status On-going      OT LONG TERM GOAL #4   Title Patient will toilet himself with no more than min assist     Baseline total assistance    Time 8    Period Weeks    Status On-going      OT LONG TERM GOAL #5   Title Patient will demonstrate 90% composite flexion in BUE to aide in functional grasp    Baseline 50% left, 75% right    Time 8    Period Weeks    Status On-going                   Plan - 07/11/21 1458     Clinical Impression Statement Patient pleased with progress, and eager to improve strength, functional mobility, and independence with ADL/IADL    OT Occupational Profile and History Detailed Assessment- Review of Records and additional review of physical, cognitive, psychosocial history related to current functional performance    Occupational performance deficits (Please refer to evaluation for details): ADL's    Body Structure / Function / Physical Skills ADL;Dexterity;Flexibility;ROM;Strength;IADL;FMC;Edema;Coordination;Balance;Body mechanics;Decreased knowledge of precautions;Endurance;Pain;Sensation;UE functional use;Skin integrity;Mobility;GMC;Fascial restriction;Decreased knowledge of use of DME    Rehab Potential Fair    Clinical Decision Making Several treatment options, min-mod task modification necessary    Comorbidities Affecting Occupational Performance: May have comorbidities impacting occupational performance    Modification or Assistance to Complete Evaluation  Min-Moderate modification of tasks or assist with assess necessary to complete eval    OT Frequency 2x / week    OT Duration 8 weeks    OT Treatment/Interventions Self-care/ADL training;Therapeutic exercise;DME and/or AE instruction;Functional Mobility Training;Balance training;Fluidtherapy;Neuromuscular education;Manual Therapy;Scar mobilization;Splinting;Patient/family education;Therapeutic activities;Passive range of motion    Plan Check remaining STG - progress note:  work on hand strength (to use sock aide and for functional tasks), standing balance with prosthetic    Consulted and Agree with Plan of  Care Patient  Patient will benefit from skilled therapeutic intervention in order to improve the following deficits and impairments:   Body Structure / Function / Physical Skills: ADL, Dexterity, Flexibility, ROM, Strength, IADL, FMC, Edema, Coordination, Balance, Body mechanics, Decreased knowledge of precautions, Endurance, Pain, Sensation, UE functional use, Skin integrity, Mobility, GMC, Fascial restriction, Decreased knowledge of use of DME       Visit Diagnosis: Muscle weakness (generalized)  Stiffness of right hand, not elsewhere classified  Stiffness of left elbow, not elsewhere classified  Stiffness of right elbow, not elsewhere classified  Stiffness of left hand, not elsewhere classified  Unsteadiness on feet  Localized edema  Other disturbances of skin sensation    Problem List Patient Active Problem List   Diagnosis Date Noted   Diabetes mellitus type 2, uncontrolled, with complications ([place]) 0000000   Diabetic nephropathy ([place]) 0000000   Chronic systolic CHF (congestive heart failure) ([place]) 04/16/2021   Hypoglycemia 04/15/2021   Hyperglycemia 04/15/2021   DKA (diabetic ketoacidosis) ([place]) 01/24/2021   COVID-19 virus infection 01/24/2021   Syncope 01/24/2021   ESRD (end stage renal disease) ([place]) 01/24/2021   Congestive heart failure (CHF) ([place]) 01/24/2021   Hypotension 01/24/2021   Shock ([place]) 01/24/2021    Mariah Milling, OTR/L 07/11/2021, 3:02 PM  [facility] [address] [place], [place], [address] Phone: [phone]   Fax:  [phone]  Name: Jose Robinson MRN: [id number] Date of Birth: 1956/07/29

## 2021-07-13 ENCOUNTER — Other Ambulatory Visit: Payer: Self-pay

## 2021-07-13 ENCOUNTER — Ambulatory Visit: Payer: Medicare (Managed Care)

## 2021-07-13 DIAGNOSIS — R2689 Other abnormalities of gait and mobility: Secondary | ICD-10-CM

## 2021-07-13 DIAGNOSIS — M6281 Muscle weakness (generalized): Secondary | ICD-10-CM | POA: Diagnosis not present

## 2021-07-13 DIAGNOSIS — R2681 Unsteadiness on feet: Secondary | ICD-10-CM

## 2021-07-13 NOTE — Therapy (Signed)
[facility] [address] [place], [place], [address] Phone: [phone]   Fax:  [phone]  Physical Therapy Treatment  Patient Details  Name: Jose Robinson MRN: [id number] Date of Birth: [birth year] Referring Provider (PT): Buzzy Han, MD   Encounter Date: 07/13/2021   PT End of Session - 07/13/21 1329     Visit Number 3    Number of Visits 11    Date for PT Re-Evaluation 08/31/21    Authorization Type [employer] Medicare/Medicaid (follow Medicare guidelines)    Progress Note Due on Visit 10    PT Start Time 1315    PT Stop Time 1400    PT Time Calculation (min) 45 min    Equipment Utilized During Treatment Gait belt    Activity Tolerance Patient tolerated treatment well    Behavior During Therapy WFL for tasks assessed/performed             Past Medical History:  Diagnosis Date   Diabetes mellitus without complication ([place])    ESRD on hemodialysis ([place])    Hypertension     Past Surgical History:  Procedure Laterality Date   BACK SURGERY     BELOW KNEE LEG AMPUTATION      There were no vitals filed for this visit.   Subjective Assessment - 07/13/21 1330     Subjective Pt reports no changes.    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 no issues    How long can you stand comfortably? <30 sec with walker    How long can you walk comfortably? within his room    Patient Stated Goals Improve balance    Pain Onset In the past 7 days                Gait training: 1 x 115', 2 x 230' RW with cues for erect posture. Fwd step with anterior weight shift: no AD, min to mod A for balance: 10x R and L Lateral step with lateral weight shift: no AD, mod A for balance: 10x R and L Fwd and lateral steps with weight shifts with st. Cane in R UE: 10x R and L each- min  to mod A Prone on elbow extensions: 5 x 30"                          PT Short Term Goals - 06/22/21 1028       PT SHORT TERM GOAL #1   Title Pt will demo 16/56 on BBS to decrease fall risk and improve walking endurance    Baseline 13/56 (06/22/21)    Time 4    Period Weeks    Status New    Target Date 07/20/21      PT SHORT TERM GOAL #2   Title Pt will demo <38 sec with TUG wit RW to improve functional mobility    Baseline 43 sec with RW (06/22/21)    Time 4    Period Weeks    Status New    Target Date 07/20/21               PT Long Term Goals - 06/22/21 1029       PT LONG TERM GOAL #1   Title Pt will demo at least 0.15 m/s improvement in his gait speed with RW to improve  functional mobility    Baseline 0.96ms with RW (06/22/21)    Time 8    Period Weeks    Status New    Target Date 08/17/21      PT LONG TERM GOAL #2   Title Pt will demo <35 sec with TUG with RW to improve functional mobility and decrease fall risk    Baseline 43 sec with RW (06/22/21)    Time 8    Period Weeks    Status New    Target Date 08/17/21      PT LONG TERM GOAL #3   Title Pt will demo >19/56 on BBS to improve functional balance and reduce fall risk    Baseline 13/56 (06/22/21)    Time 8    Period Weeks    Status New    Target Date 08/17/21                   Plan - 07/13/21 1352     Clinical Impression Statement Today's skilled session was focused on revewing HEP and progressing his weight shifts with decreasing independnece. Pt is highly motivated and will beneift from continued PT to improve balance.    Personal Factors and Comorbidities Age;Comorbidity 3+    Comorbidities ESRD on HD TTS, CAD, PAD, chronic systolic heart failure, COPD, insulin-dependent diabetes, history of right BKA    Examination-Activity Limitations Bathing;Bed Mobility;Carry;Dressing;Hygiene/Grooming;Lift;Self Feeding;Sleep;Squat;Stairs;Stand;Toileting;Transfers     Examination-Participation Restrictions Cleaning;Community Activity;Laundry;Medication Management;Meal Prep;Occupation;Shop;Church    Stability/Clinical Decision Making Stable/Uncomplicated    Rehab Potential Good    PT Frequency 2x / week    PT Duration 8 weeks    PT Treatment/Interventions ADLs/Self Care Home Management;Cryotherapy;Moist Heat;Gait training;Stair training;Functional mobility training;Therapeutic activities;Therapeutic exercise;Balance training;Neuromuscular re-education;Patient/family education;Orthotic Fit/Training;Prosthetic Training;Manual techniques;Scar mobilization;Passive range of motion;Energy conservation;Joint Manipulations    PT Next Visit Plan How was HEP? continue gait training focused on ireased ambulation distance. Static Standing Balance, Weight Shift Activities    PT Home Exercise Plan Access Code: [email or web address]   Consulted and Agree with Plan of Care Patient             Patient will benefit from skilled therapeutic intervention in order to improve the following deficits and impairments:  Abnormal gait, Decreased activity tolerance, Decreased balance, Decreased coordination, Decreased endurance, Decreased mobility, Decreased range of motion, Decreased safety awareness, Difficulty walking, Decreased strength, Increased fascial restricitons, Impaired flexibility, Improper body mechanics, Postural dysfunction, Prosthetic Dependency  Visit Diagnosis: Unsteadiness on feet  Muscle weakness (generalized)  Other abnormalities of gait and mobility     Problem List Patient Active Problem List   Diagnosis Date Noted   Diabetes mellitus type 2, uncontrolled, with complications ([facility] [place] 00000000  Diabetic nephropathy ([facility] 00000000  Chronic systolic CHF (congestive heart failure) ([facility] 04/16/2021   Hypoglycemia 04/15/2021   Hyperglycemia 04/15/2021   DKA (diabetic ketoacidosis) ([facility] 01/24/2021   COVID-19 virus infection 01/24/2021   Syncope 01/24/2021    ESRD (end stage renal disease) ([facility] 01/24/2021   Congestive heart failure (CHF) ([facility] 01/24/2021   Hypotension 01/24/2021   Shock ([facility] 01/24/2021    KKerrie Pleasure PT 07/13/2021, 1:56 PM  [facility] [address]Phone: [phone]  Fax:  [phone] Name: Jose SHERROWMRN: [id number] of Birth: 111-12-1955

## 2021-07-18 ENCOUNTER — Other Ambulatory Visit: Payer: Self-pay

## 2021-07-18 ENCOUNTER — Ambulatory Visit: Payer: Medicare (Managed Care) | Admitting: Occupational Therapy

## 2021-07-18 ENCOUNTER — Ambulatory Visit: Payer: Medicare (Managed Care)

## 2021-07-18 ENCOUNTER — Encounter: Payer: Self-pay | Admitting: Occupational Therapy

## 2021-07-18 DIAGNOSIS — M25642 Stiffness of left hand, not elsewhere classified: Secondary | ICD-10-CM

## 2021-07-18 DIAGNOSIS — R2681 Unsteadiness on feet: Secondary | ICD-10-CM

## 2021-07-18 DIAGNOSIS — M25622 Stiffness of left elbow, not elsewhere classified: Secondary | ICD-10-CM

## 2021-07-18 DIAGNOSIS — M6281 Muscle weakness (generalized): Secondary | ICD-10-CM

## 2021-07-18 DIAGNOSIS — R208 Other disturbances of skin sensation: Secondary | ICD-10-CM

## 2021-07-18 DIAGNOSIS — R2689 Other abnormalities of gait and mobility: Secondary | ICD-10-CM

## 2021-07-18 DIAGNOSIS — M25621 Stiffness of right elbow, not elsewhere classified: Secondary | ICD-10-CM

## 2021-07-18 DIAGNOSIS — R600 Localized edema: Secondary | ICD-10-CM

## 2021-07-18 DIAGNOSIS — M25641 Stiffness of right hand, not elsewhere classified: Secondary | ICD-10-CM

## 2021-07-18 NOTE — Therapy (Signed)
[facility] [address] [place], [place], [address] Phone: [phone]   Fax:  [phone]  Physical Therapy Treatment  Patient Details  Name: Jose Robinson MRN: [id number] Date of Birth: February 26, 1956 Referring Provider (PT): Buzzy Han, MD   Encounter Date: 07/18/2021   PT End of Session - 07/18/21 1236     Visit Number 4    Number of Visits 11    Date for PT Re-Evaluation 08/31/21    Authorization Type [employer] Medicare/Medicaid (follow Medicare guidelines)    Progress Note Due on Visit 10    PT Start Time 1230    PT Stop Time 1315    PT Time Calculation (min) 45 min    Equipment Utilized During Treatment Gait belt    Activity Tolerance Patient tolerated treatment well    Behavior During Therapy WFL for tasks assessed/performed             Past Medical History:  Diagnosis Date   Diabetes mellitus without complication ([facility])    ESRD on hemodialysis ([place])    Hypertension     Past Surgical History:  Procedure Laterality Date   BACK SURGERY     BELOW KNEE LEG AMPUTATION      There were no vitals filed for this visit.   Subjective Assessment - 07/18/21 1236     Subjective Pt reports no changes.    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 no issues    How long can you stand comfortably? <30 sec with walker    How long can you walk comfortably? within his room    Patient Stated Goals Improve balance    Pain Onset In the past 7 days                [facility] PT Assessment - 07/18/21 1253       Berg Balance Test   Sit to Stand Able to stand  independently using hands    Standing Unsupported Needs several tries to stand 30 seconds unsupported    Sitting with Back Unsupported but Feet Supported on Floor or Stool Able to sit safely and securely 2 minutes     Stand to Sit Controls descent by using hands    Transfers Able to transfer safely, definite need of hands    Standing Unsupported with Eyes Closed Needs help to keep from falling    Standing Unsupported with Feet Together Needs help to attain position and unable to hold for 15 seconds    From Standing, Reach Forward with Outstretched Arm Loses balance while trying/requires external support    From Standing Position, Pick up Object from Floor Unable to try/needs assist to keep balance    From Standing Position, Turn to Look Behind Over each Shoulder Needs assist to keep from losing balance and falling    Turn 360 Degrees Needs assistance while turning    Standing Unsupported, Alternately Place Feet on Step/Stool Needs assistance to keep from falling or unable to try    Standing Unsupported, One Foot in [employer] balance while stepping or standing    Standing on One Leg Unable to try or needs assist to prevent fall    Total Score 14      Timed Up and Go Test   Normal TUG (seconds) 47   RW          Passive  stretch for knee to chest, piriformis (knee to shoulder and knee away from shoulder), hamstrings Lower trunk rotations: 10x TUG performed twice BBS performed Standing lumbar extensions with back against countertop: 2 x 10 Gait training: 2 x 115'                           PT Short Term Goals - 07/18/21 1300       PT SHORT TERM GOAL #1   Title Pt will demo 16/56 on BBS to decrease fall risk and improve walking endurance    Baseline 13/56 (06/22/21); 14/56 (07/18/21)    Time 4    Period Weeks    Status On-going    Target Date 07/20/21      PT SHORT TERM GOAL #2   Title Pt will demo <38 sec with TUG wit RW to improve functional mobility    Baseline 43 sec with RW (06/22/21); 36 sec with RW (07/18/21)    Time 4    Period Weeks    Status Achieved    Target Date 07/20/21               PT Long Term Goals - 06/22/21 1029       PT LONG TERM GOAL #1    Title Pt will demo at least 0.15 m/s improvement in his gait speed with RW to improve functional mobility    Baseline 0.32ms with RW (06/22/21)    Time 8    Period Weeks    Status New    Target Date 08/17/21      PT LONG TERM GOAL #2   Title Pt will demo <35 sec with TUG with RW to improve functional mobility and decrease fall risk    Baseline 43 sec with RW (06/22/21)    Time 8    Period Weeks    Status New    Target Date 08/17/21      PT LONG TERM GOAL #3   Title Pt will demo >19/56 on BBS to improve functional balance and reduce fall risk    Baseline 13/56 (06/22/21)    Time 8    Period Weeks    Status New    Target Date 08/17/21                   Plan - 07/18/21 1307     Clinical Impression Statement Met short term goal 2 today. Making only small progress towards his berg balance scale. Today's session focused on improving flexibility to improve up right posture.    Personal Factors and Comorbidities Age;Comorbidity 3+    Comorbidities ESRD on HD TTS, CAD, PAD, chronic systolic heart failure, COPD, insulin-dependent diabetes, history of right BKA    Examination-Activity Limitations Bathing;Bed Mobility;Carry;Dressing;Hygiene/Grooming;Lift;Self Feeding;Sleep;Squat;Stairs;Stand;Toileting;Transfers    Examination-Participation Restrictions Cleaning;Community Activity;Laundry;Medication Management;Meal Prep;Occupation;Shop;Church    Stability/Clinical Decision Making Stable/Uncomplicated    Rehab Potential Good    PT Frequency 2x / week    PT Duration 8 weeks    PT Treatment/Interventions ADLs/Self Care Home Management;Cryotherapy;Moist Heat;Gait training;Stair training;Functional mobility training;Therapeutic activities;Therapeutic exercise;Balance training;Neuromuscular re-education;Patient/family education;Orthotic Fit/Training;Prosthetic Training;Manual techniques;Scar mobilization;Passive range of motion;Energy conservation;Joint Manipulations    PT Next Visit Plan  How was HEP? continue gait training focused on ireased ambulation distance. Static Standing Balance, Weight Shift Activities    PT Home Exercise Plan Access Code: [email or web address]   Consulted and Agree with Plan of Care Patient  Patient will benefit from skilled therapeutic intervention in order to improve the following deficits and impairments:  Abnormal gait, Decreased activity tolerance, Decreased balance, Decreased coordination, Decreased endurance, Decreased mobility, Decreased range of motion, Decreased safety awareness, Difficulty walking, Decreased strength, Increased fascial restricitons, Impaired flexibility, Improper body mechanics, Postural dysfunction, Prosthetic Dependency  Visit Diagnosis: Unsteadiness on feet  Muscle weakness (generalized)  Other abnormalities of gait and mobility     Problem List Patient Active Problem List   Diagnosis Date Noted   Diabetes mellitus type 2, uncontrolled, with complications ([place]) 01/02/458   Diabetic nephropathy ([place]) 13/68/5992   Chronic systolic CHF (congestive heart failure) ([place]) 04/16/2021   Hypoglycemia 04/15/2021   Hyperglycemia 04/15/2021   DKA (diabetic ketoacidosis) ([place]) 01/24/2021   COVID-19 virus infection 01/24/2021   Syncope 01/24/2021   ESRD (end stage renal disease) ([place]) 01/24/2021   Congestive heart failure (CHF) ([place]) 01/24/2021   Hypotension 01/24/2021   Shock ([place]) 01/24/2021    Kerrie Pleasure, PT 07/18/2021, 1:08 PM  [facility] [address] [place], [place], [address] Phone: [phone]   Fax:  [phone]  Name: Jose Robinson MRN: [id number] Date of Birth: [birth year]

## 2021-07-18 NOTE — Therapy (Signed)
[facility] [address] [place], [place], [address] Phone: [phone]   Fax:  [phone]  Occupational Therapy Treatment and Progress Report  Patient Details  Name: Jose Robinson MRN: [id number] Date of Birth: 07-13-1956 Referring Provider (OT): Tobias Alexander   Encounter Date: 07/18/2021   OT End of Session - 07/18/21 1527     Visit Number 10    Number of Visits 17    Date for OT Re-Evaluation 08/20/21    Authorization Type [employer] Medicare/ Medicaid    OT Start Time 13    OT Stop Time 1315    OT Time Calculation (min) 45 min    Activity Tolerance Patient tolerated treatment well    Behavior During Therapy WFL for tasks assessed/performed             Past Medical History:  Diagnosis Date   Diabetes mellitus without complication ([place])    ESRD on hemodialysis ([place])    Hypertension     Past Surgical History:  Procedure Laterality Date   BACK SURGERY     BELOW KNEE LEG AMPUTATION      There were no vitals filed for this visit.   Subjective Assessment - 07/18/21 1149     Subjective  This heat is brutal.    Pertinent History Rt BKA, DM, neuropathy    Currently in Pain? Yes    Pain Score 7     Pain Location Abdomen    Pain Orientation Right    Pain Descriptors / Indicators Aching    Pain Type Chronic pain    Pain Onset More than a month ago    Pain Frequency Intermittent    Aggravating Factors  Unsure    Pain Relieving Factors Unsure                          OT Treatments/Exercises (OP) - 07/18/21 0001       ADLs   Eating Patient reports improved ability to cut food.  Daughter ordering rocker knife    LB Dressing Improved report of pulling up pants from thighs over hips      Modalities   Modalities Fluidotherapy      RUE Fluidotherapy   Number Minutes Fluidotherapy 10 Minutes    RUE Fluidotherapy Location Hand;Wrist;Forearm      LUE Fluidotherapy   Number  Minutes Fluidotherapy 10 Minutes    LUE Fluidotherapy Location Hand;Wrist;Forearm      Manual Therapy   Manual Therapy Passive ROM    Manual therapy comments composite flexion and extension digits and wrist, elbow, shoulder flex/ext/ abd/add, IR/ER                      OT Short Term Goals - 07/18/21 1150       OT SHORT TERM GOAL #1   Title Patient will complete an HEP designed to improve range of motion in bilateral hands, forearms, and elbows    Baseline No HEP    Time 4    Period Weeks    Status Achieved      OT SHORT TERM GOAL #2   Title Patient will complete an HEP designed to build upper body strength    Baseline No HEP    Time 4    Period Weeks    Status Achieved      OT SHORT TERM GOAL #3   Title Patient will don pull over shirt with set  up assistance    Baseline per patient mod assist at times    Time 4    Period Weeks    Status Achieved      OT SHORT TERM GOAL #4   Title Patient will verbalize understanding of process to don prosthesis for RLE    Baseline Dependent    Time 4    Period Weeks    Status Achieved      OT SHORT TERM GOAL #5   Title Patient will demonstrate ability to cut food on plate with set up assistance    Time 4    Period Weeks    Status Achieved               OT Long Term Goals - 07/18/21 1155       OT LONG TERM GOAL #1   Title Patient will complete updated HEP for BUE rane, strength and functional use.    Baseline No HEP    Period Weeks    Status On-going      OT LONG TERM GOAL #2   Title Patient will don elastic waist shorts or pants with min assist    Baseline dependent    Time 8    Period Weeks      OT LONG TERM GOAL #3   Baseline less than 10 lbs on eval.  7/20 - 12lb right 10 lbs left    Time 8    Period Weeks    Status On-going      OT LONG TERM GOAL #4   Title Patient will toilet himself with no more than min assist    Baseline total assistance    Time 8    Period Weeks    Status On-going       OT LONG TERM GOAL #5   Title Patient will demonstrate 90% composite flexion in BUE to aide in functional grasp    Baseline 50% left, 75% right    Time 8    Period Weeks    Status On-going                   Plan - 07/18/21 1528     Clinical Impression Statement Patient has met all short term goals and is progressing toward long term goals.  This progress report covers dates of service from 6/8-7/20/22.    OT Occupational Profile and History Detailed Assessment- Review of Records and additional review of physical, cognitive, psychosocial history related to current functional performance    Occupational performance deficits (Please refer to evaluation for details): ADL's    Body Structure / Function / Physical Skills ADL;Dexterity;Flexibility;ROM;Strength;IADL;FMC;Edema;Coordination;Balance;Body mechanics;Decreased knowledge of precautions;Endurance;Pain;Sensation;UE functional use;Skin integrity;Mobility;GMC;Fascial restriction;Decreased knowledge of use of DME    Rehab Potential Fair    Clinical Decision Making Several treatment options, min-mod task modification necessary    Comorbidities Affecting Occupational Performance: May have comorbidities impacting occupational performance    Modification or Assistance to Complete Evaluation  Min-Moderate modification of tasks or assist with assess necessary to complete eval    OT Frequency 2x / week    OT Duration 8 weeks    OT Treatment/Interventions Self-care/ADL training;Therapeutic exercise;DME and/or AE instruction;Functional Mobility Training;Balance training;Fluidtherapy;Neuromuscular education;Manual Therapy;Scar mobilization;Splinting;Patient/family education;Therapeutic activities;Passive range of motion    Plan work on hand strength (to use sock aide and for functional tasks), standing balance with prosthetic    Consulted and Agree with Plan of Care Patient  Patient will benefit from skilled therapeutic  intervention in order to improve the following deficits and impairments:   Body Structure / Function / Physical Skills: ADL, Dexterity, Flexibility, ROM, Strength, IADL, FMC, Edema, Coordination, Balance, Body mechanics, Decreased knowledge of precautions, Endurance, Pain, Sensation, UE functional use, Skin integrity, Mobility, GMC, Fascial restriction, Decreased knowledge of use of DME       Visit Diagnosis: Stiffness of right hand, not elsewhere classified  Stiffness of left elbow, not elsewhere classified  Stiffness of right elbow, not elsewhere classified  Stiffness of left hand, not elsewhere classified  Localized edema  Other disturbances of skin sensation  Muscle weakness (generalized)  Unsteadiness on feet    Problem List Patient Active Problem List   Diagnosis Date Noted   Diabetes mellitus type 2, uncontrolled, with complications ([facility]) 67/73/7366   Diabetic nephropathy ([facility]) 81/59/4707   Chronic systolic CHF (congestive heart failure) ([facility]) 04/16/2021   Hypoglycemia 04/15/2021   Hyperglycemia 04/15/2021   DKA (diabetic ketoacidosis) ([facility]) 01/24/2021   COVID-19 virus infection 01/24/2021   Syncope 01/24/2021   ESRD (end stage renal disease) ([facility]) 01/24/2021   Congestive heart failure (CHF) ([facility]) 01/24/2021   Hypotension 01/24/2021   Shock ([place] [facility]) 01/24/2021    Mariah Milling, OTR/L 07/18/2021, 3:30 PM  [facility] [address] [place], [place], [address] Phone: [phone]   Fax:  [phone]  Name: Jose Robinson MRN: [id number] Date of Birth: 1956-08-11

## 2021-07-20 ENCOUNTER — Encounter: Payer: Self-pay | Admitting: Occupational Therapy

## 2021-07-20 ENCOUNTER — Ambulatory Visit: Payer: Medicare (Managed Care)

## 2021-07-20 ENCOUNTER — Ambulatory Visit: Payer: Medicare (Managed Care) | Admitting: Occupational Therapy

## 2021-07-20 ENCOUNTER — Other Ambulatory Visit: Payer: Self-pay

## 2021-07-20 DIAGNOSIS — R2681 Unsteadiness on feet: Secondary | ICD-10-CM

## 2021-07-20 DIAGNOSIS — Z89511 Acquired absence of right leg below knee: Secondary | ICD-10-CM

## 2021-07-20 DIAGNOSIS — M25622 Stiffness of left elbow, not elsewhere classified: Secondary | ICD-10-CM

## 2021-07-20 DIAGNOSIS — R2689 Other abnormalities of gait and mobility: Secondary | ICD-10-CM

## 2021-07-20 DIAGNOSIS — M6281 Muscle weakness (generalized): Secondary | ICD-10-CM | POA: Diagnosis not present

## 2021-07-20 DIAGNOSIS — M25621 Stiffness of right elbow, not elsewhere classified: Secondary | ICD-10-CM

## 2021-07-20 NOTE — Therapy (Signed)
[facility] [address] [place], [place], [address] Phone: [phone]   Fax:  [phone]  Physical Therapy Treatment  Patient Details  Name: Jose Robinson MRN: [id number] Date of Birth: 02-10-1956 Referring Provider (PT): Buzzy Han, MD   Encounter Date: 07/20/2021   PT End of Session - 07/20/21 1152     Visit Number 5    Number of Visits 11    Date for PT Re-Evaluation 08/31/21    Authorization Type [employer] Medicare/Medicaid (follow Medicare guidelines)    Progress Note Due on Visit 10    PT Start Time 1100    PT Stop Time 1145    PT Time Calculation (min) 45 min    Equipment Utilized During Treatment Gait belt    Activity Tolerance Patient tolerated treatment well    Behavior During Therapy WFL for tasks assessed/performed             Past Medical History:  Diagnosis Date   Diabetes mellitus without complication ([facility])    ESRD on hemodialysis ([place])    Hypertension     Past Surgical History:  Procedure Laterality Date   BACK SURGERY     BELOW KNEE LEG AMPUTATION      There were no vitals filed for this visit.   Subjective Assessment - 07/20/21 1153     Subjective I felt good after stretcing last time. I have been doing the back stretching but it is making my back sore.    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 no issues    How long can you stand comfortably? <30 sec with walker    How long can you walk comfortably? within his room    Patient Stated Goals Improve balance    Pain Onset In the past 7 days               Pt educated on not holding back stretch for 15 sec and extending back only until he feels mild and comfortable stretch and avoiding strong stretch/pain   Passive hip flexion/abduction and flexion/adduction: 20x each R and  L Passive hip extension bil: 20x R and L Passive hip flexor stretching: 3 x 30" R and L in sidelying position Prone HS curls: 2 x 10, pt required A on R LE Prone hip extensions: 3 x 5 R and L Gait training: 1 x 115' cues to relax shoulders to reduce WB through UP, erect posture Sit to stand: 10x with bil UE, pt maintains standing balance without UE support for 5 sec before sitting down, required min A with standing balance Pt wheeled to outside of building per patient's request to wait for his ride.                      PT Short Term Goals - 07/18/21 1300       PT SHORT TERM GOAL #1   Title Pt will demo 16/56 on BBS to decrease fall risk and improve walking endurance    Baseline 13/56 (06/22/21); 14/56 (07/18/21)    Time 4    Period Weeks    Status On-going    Target Date 07/20/21      PT SHORT TERM GOAL #2   Title Pt will demo <38 sec with TUG wit RW to improve functional mobility    Baseline 43 sec with RW (06/22/21);  36 sec with RW (07/18/21)    Time 4    Period Weeks    Status Achieved    Target Date 07/20/21               PT Long Term Goals - 06/22/21 1029       PT LONG TERM GOAL #1   Title Pt will demo at least 0.15 m/s improvement in his gait speed with RW to improve functional mobility    Baseline 0.60ms with RW (06/22/21)    Time 8    Period Weeks    Status New    Target Date 08/17/21      PT LONG TERM GOAL #2   Title Pt will demo <35 sec with TUG with RW to improve functional mobility and decrease fall risk    Baseline 43 sec with RW (06/22/21)    Time 8    Period Weeks    Status New    Target Date 08/17/21      PT LONG TERM GOAL #3   Title Pt will demo >19/56 on BBS to improve functional balance and reduce fall risk    Baseline 13/56 (06/22/21)    Time 8    Period Weeks    Status New    Target Date 08/17/21                   Plan - 07/20/21 1123     Clinical Impression Statement Pt demonstrated significant weakness in  bil hamstrings and gluts with prone exercises. Will benefit from prone exercises to strengthen posterior chain.    Personal Factors and Comorbidities Age;Comorbidity 3+    Comorbidities ESRD on HD TTS, CAD, PAD, chronic systolic heart failure, COPD, insulin-dependent diabetes, history of right BKA    Examination-Activity Limitations Bathing;Bed Mobility;Carry;Dressing;Hygiene/Grooming;Lift;Self Feeding;Sleep;Squat;Stairs;Stand;Toileting;Transfers    Examination-Participation Restrictions Cleaning;Community Activity;Laundry;Medication Management;Meal Prep;Occupation;Shop;Church    Stability/Clinical Decision Making Stable/Uncomplicated    Rehab Potential Good    PT Frequency 2x / week    PT Duration 8 weeks    PT Treatment/Interventions ADLs/Self Care Home Management;Cryotherapy;Moist Heat;Gait training;Stair training;Functional mobility training;Therapeutic activities;Therapeutic exercise;Balance training;Neuromuscular re-education;Patient/family education;Orthotic Fit/Training;Prosthetic Training;Manual techniques;Scar mobilization;Passive range of motion;Energy conservation;Joint Manipulations    PT Next Visit Plan How was HEP? continue gait training focused on ireased ambulation distance. Static Standing Balance, Weight Shift Activities    PT Home Exercise Plan Access Code: [email or web address]   Consulted and Agree with Plan of Care Patient             Patient will benefit from skilled therapeutic intervention in order to improve the following deficits and impairments:  Abnormal gait, Decreased activity tolerance, Decreased balance, Decreased coordination, Decreased endurance, Decreased mobility, Decreased range of motion, Decreased safety awareness, Difficulty walking, Decreased strength, Increased fascial restricitons, Impaired flexibility, Improper body mechanics, Postural dysfunction, Prosthetic Dependency  Visit Diagnosis: Unsteadiness on feet  Muscle weakness (generalized)  Other  abnormalities of gait and mobility  Hx of right BKA ([facility]     Problem List Patient Active Problem List   Diagnosis Date Noted   Diabetes mellitus type 2, uncontrolled, with complications ([place] 00000000  Diabetic nephropathy ([facility] 00000000  Chronic systolic CHF (congestive heart failure) ([facility] [place] 04/16/2021   Hypoglycemia 04/15/2021   Hyperglycemia 04/15/2021   DKA (diabetic ketoacidosis) ([facility] 01/24/2021   COVID-19 virus infection 01/24/2021   Syncope 01/24/2021   ESRD (end stage renal disease) ([facility] 01/24/2021   Congestive heart failure (CHF) ([facility] 01/24/2021   Hypotension 01/24/2021  Shock ([place]) 01/24/2021    Kerrie Pleasure, PT 07/20/2021, 11:54 AM  [facility] [address] [place], [place], [address] Phone: [phone]   Fax:  [phone]  Name: Jose Robinson MRN: [id number] Date of Birth: [birth year]

## 2021-07-20 NOTE — Therapy (Signed)
[facility] [address] [place], [place], [address] Phone: [phone]   Fax:  [phone]  Occupational Therapy Treatment  Patient Details  Name: Jose Robinson MRN: [id number] Date of Birth: 1956-01-16 Referring Provider (OT): Tobias Alexander   Encounter Date: 07/20/2021   OT End of Session - 07/20/21 1020     Visit Number 11    Number of Visits 17    Date for OT Re-Evaluation 08/20/21    Authorization Type [employer] Medicare/ Medicaid    OT Start Time 1018    OT Stop Time 1100    OT Time Calculation (min) 42 min    Activity Tolerance Patient tolerated treatment well    Behavior During Therapy WFL for tasks assessed/performed             Past Medical History:  Diagnosis Date   Diabetes mellitus without complication ([facility])    ESRD on hemodialysis ([facility])    Hypertension     Past Surgical History:  Procedure Laterality Date   BACK SURGERY     BELOW KNEE LEG AMPUTATION      There were no vitals filed for this visit.   Subjective Assessment - 07/20/21 1020     Subjective  "i'm tired - I've been doing a lot of exercises"    Pertinent History Rt BKA, DM, neuropathy    Currently in Pain? No/denies                          OT Treatments/Exercises (OP) - 07/20/21 1021       ADLs   Toileting "took myself to the bathroom and pulled my clothes up real good - they came in and checked and I did everything well and got myself cleaned up"      Exercises   Exercises Shoulder      Shoulder Exercises: Supine   Other Supine Exercises supine shoulder exercises with unweighted dowel - shoulder flexion, chest press, horizontal abdution, ER/PR and abduction - BUE exercises for increased range of motion                      OT Short Term Goals - 07/18/21 1150       OT SHORT TERM GOAL #1   Title Patient will complete an HEP designed to improve range of motion in bilateral hands,  forearms, and elbows    Baseline No HEP    Time 4    Period Weeks    Status Achieved      OT SHORT TERM GOAL #2   Title Patient will complete an HEP designed to build upper body strength    Baseline No HEP    Time 4    Period Weeks    Status Achieved      OT SHORT TERM GOAL #3   Title Patient will don pull over shirt with set up assistance    Baseline per patient mod assist at times    Time 4    Period Weeks    Status Achieved      OT SHORT TERM GOAL #4   Title Patient will verbalize understanding of process to don prosthesis for RLE    Baseline Dependent    Time 4    Period Weeks    Status Achieved      OT SHORT TERM GOAL #5   Title Patient will demonstrate ability to cut food on plate with set up  assistance    Time 4    Period Weeks    Status Achieved               OT Long Term Goals - 07/18/21 1155       OT LONG TERM GOAL #1   Title Patient will complete updated HEP for BUE rane, strength and functional use.    Baseline No HEP    Period Weeks    Status On-going      OT LONG TERM GOAL #2   Title Patient will don elastic waist shorts or pants with min assist    Baseline dependent    Time 8    Period Weeks      OT LONG TERM GOAL #3   Baseline less than 10 lbs on eval.  7/20 - 12lb right 10 lbs left    Time 8    Period Weeks    Status On-going      OT LONG TERM GOAL #4   Title Patient will toilet himself with no more than min assist    Baseline total assistance    Time 8    Period Weeks    Status On-going      OT LONG TERM GOAL #5   Title Patient will demonstrate 90% composite flexion in BUE to aide in functional grasp    Baseline 50% left, 75% right    Time 8    Period Weeks    Status On-going                   Plan - 07/20/21 1312     Clinical Impression Statement Pt is progressing towards unmet goals. Continue progress towards increasing independence with ADLs. Pt reports completing toileting with mod I    OT Occupational  Profile and History Detailed Assessment- Review of Records and additional review of physical, cognitive, psychosocial history related to current functional performance    Occupational performance deficits (Please refer to evaluation for details): ADL's    Body Structure / Function / Physical Skills ADL;Dexterity;Flexibility;ROM;Strength;IADL;FMC;Edema;Coordination;Balance;Body mechanics;Decreased knowledge of precautions;Endurance;Pain;Sensation;UE functional use;Skin integrity;Mobility;GMC;Fascial restriction;Decreased knowledge of use of DME    Rehab Potential Fair    Clinical Decision Making Several treatment options, min-mod task modification necessary    Comorbidities Affecting Occupational Performance: May have comorbidities impacting occupational performance    Modification or Assistance to Complete Evaluation  Min-Moderate modification of tasks or assist with assess necessary to complete eval    OT Frequency 2x / week    OT Duration 8 weeks    OT Treatment/Interventions Self-care/ADL training;Therapeutic exercise;DME and/or AE instruction;Functional Mobility Training;Balance training;Fluidtherapy;Neuromuscular education;Manual Therapy;Scar mobilization;Splinting;Patient/family education;Therapeutic activities;Passive range of motion    Plan work on hand strength (to use sock aide and for functional tasks), standing balance with prosthetic    Consulted and Agree with Plan of Care Patient             Patient will benefit from skilled therapeutic intervention in order to improve the following deficits and impairments:   Body Structure / Function / Physical Skills: ADL, Dexterity, Flexibility, ROM, Strength, IADL, FMC, Edema, Coordination, Balance, Body mechanics, Decreased knowledge of precautions, Endurance, Pain, Sensation, UE functional use, Skin integrity, Mobility, GMC, Fascial restriction, Decreased knowledge of use of DME       Visit Diagnosis: Unsteadiness on feet  Muscle  weakness (generalized)  Stiffness of left elbow, not elsewhere classified  Other abnormalities of gait and mobility  Stiffness of right elbow, not elsewhere classified  Problem List Patient Active Problem List   Diagnosis Date Noted   Diabetes mellitus type 2, uncontrolled, with complications ([place]) 0000000   Diabetic nephropathy ([facility]) 0000000   Chronic systolic CHF (congestive heart failure) ([facility]) 04/16/2021   Hypoglycemia 04/15/2021   Hyperglycemia 04/15/2021   DKA (diabetic ketoacidosis) ([facility]) 01/24/2021   COVID-19 virus infection 01/24/2021   Syncope 01/24/2021   ESRD (end stage renal disease) ([facility]) 01/24/2021   Congestive heart failure (CHF) ([facility]) 01/24/2021   Hypotension 01/24/2021   Shock ([place]) 01/24/2021    Zachery Conch MOT, OTR/L  07/20/2021, 1:13 PM  [facility] [address] [place], [place], [address] Phone: [phone]   Fax:  [phone]  Name: Jose Robinson MRN: [id number] Date of Birth: [birth year]

## 2021-07-25 ENCOUNTER — Encounter: Payer: Self-pay | Admitting: Physical Therapy

## 2021-07-25 ENCOUNTER — Ambulatory Visit: Payer: Medicare (Managed Care) | Admitting: Occupational Therapy

## 2021-07-25 ENCOUNTER — Ambulatory Visit: Payer: Medicare (Managed Care) | Admitting: Physical Therapy

## 2021-07-25 ENCOUNTER — Other Ambulatory Visit: Payer: Self-pay

## 2021-07-25 DIAGNOSIS — R2689 Other abnormalities of gait and mobility: Secondary | ICD-10-CM

## 2021-07-25 DIAGNOSIS — R2681 Unsteadiness on feet: Secondary | ICD-10-CM

## 2021-07-25 DIAGNOSIS — M25642 Stiffness of left hand, not elsewhere classified: Secondary | ICD-10-CM

## 2021-07-25 DIAGNOSIS — M25622 Stiffness of left elbow, not elsewhere classified: Secondary | ICD-10-CM

## 2021-07-25 DIAGNOSIS — M6281 Muscle weakness (generalized): Secondary | ICD-10-CM

## 2021-07-25 DIAGNOSIS — M25621 Stiffness of right elbow, not elsewhere classified: Secondary | ICD-10-CM

## 2021-07-25 DIAGNOSIS — M25641 Stiffness of right hand, not elsewhere classified: Secondary | ICD-10-CM
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
## 2021-07-25 NOTE — Therapy (Signed)
[facility] [address] [place], [place], [address] Phone: [phone]   Fax:  [phone]  Physical Therapy Treatment  Patient Details  Name: Jose Robinson MRN: [id number] Date of Birth: 15-Mar-1956 Referring Provider (PT): Buzzy Han, MD   Encounter Date: 07/25/2021   PT End of Session - 07/25/21 1236     Visit Number 6    Number of Visits 11    Date for PT Re-Evaluation 08/31/21    Authorization Type [employer] Medicare/Medicaid (follow Medicare guidelines)    Progress Note Due on Visit 10    PT Start Time 1233    PT Stop Time 1315    PT Time Calculation (min) 42 min    Equipment Utilized During Treatment Gait belt    Activity Tolerance Patient tolerated treatment well    Behavior During Therapy WFL for tasks assessed/performed             Past Medical History:  Diagnosis Date   Diabetes mellitus without complication ([place])    ESRD on hemodialysis ([place])    Hypertension     Past Surgical History:  Procedure Laterality Date   BACK SURGERY     BELOW KNEE LEG AMPUTATION      There were no vitals filed for this visit.   Subjective Assessment - 07/25/21 1232     Subjective No new complaints. No falls. Reports HEP is going well at home.    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 no issues    How long can you stand comfortably? <30 sec with walker    How long can you walk comfortably? within his room    Currently in Pain? Yes    Pain Score 7     Pain Location Neck    Pain Descriptors / Indicators Sore;Tender    Pain Type Chronic pain    Pain Onset More than a month ago    Pain Frequency Intermittent    Aggravating Factors  not sure    Pain Relieving Factors stretching                               [facility] Adult PT Treatment/Exercise -  07/25/21 1236       Transfers   Transfers Sit to Stand;Stand to Sit    Sit to Stand 5: Supervision;With upper extremity assist;From bed;From chair/3-in-1    Stand to Sit 5: Supervision;With upper extremity assist;To bed;To chair/3-in-1      Ambulation/Gait   Ambulation/Gait Yes    Ambulation/Gait Assistance 4: Min guard    Ambulation/Gait Assistance Details pt able to stand taller with cues with increased hip/trunk extension noted. cues for equal step length with decreased left step length noted.    Ambulation Distance (Feet) 115 Feet   x2   Assistive device Rolling walker;Prosthesis    Gait Pattern Step-through pattern;Decreased stride length;Decreased hip/knee flexion - right;Decreased hip/knee flexion - left;Trunk flexed;Narrow base of support   flexed hips   Ambulation Surface Level;Indoor      Knee/Hip Exercises: Supine   Bridges AROM;AAROM;Strengthening;Both;10 reps;Limitations    Bridges Limitations with standard bridges pt needed assist to stabilize bil knees to prevent them from falling out; with second set had pt work on squeezing a yoga ball with assist to further address VMO strengthening.    Bridges with [employer]  Squeeze AROM;AAROM;Strengthening;Both;1 set;10 reps;Limitations    Other Supine Knee/Hip Exercises hooklying with red band around knees- hip fall outs for 10 reps each side with assist needed to stabilize non moving LE and PTA hand as a target    Other Supine Knee/Hip Exercises with LE"s extended out adn feet resting on pillow:      Knee/Hip Exercises: Prone   Hamstring Curl 1 set;10 reps;Limitations    Hamstring Curl Limitations AAROM bil sides with cues for slow, controlled movements    Other Prone Exercises prone passive quad/hip flexor stretch with overpressure at pelvis for 30 sec holds x 3 reps each side                      PT Short Term Goals - 07/18/21 1300       PT SHORT TERM GOAL #1   Title Pt will demo 16/56 on BBS to decrease fall risk and  improve walking endurance    Baseline 13/56 (06/22/21); 14/56 (07/18/21)    Time 4    Period Weeks    Status On-going    Target Date 07/20/21      PT SHORT TERM GOAL #2   Title Pt will demo <38 sec with TUG wit RW to improve functional mobility    Baseline 43 sec with RW (06/22/21); 36 sec with RW (07/18/21)    Time 4    Period Weeks    Status Achieved    Target Date 07/20/21               PT Long Term Goals - 06/22/21 1029       PT LONG TERM GOAL #1   Title Pt will demo at least 0.15 m/s improvement in his gait speed with RW to improve functional mobility    Baseline 0.63ms with RW (06/22/21)    Time 8    Period Weeks    Status New    Target Date 08/17/21      PT LONG TERM GOAL #2   Title Pt will demo <35 sec with TUG with RW to improve functional mobility and decrease fall risk    Baseline 43 sec with RW (06/22/21)    Time 8    Period Weeks    Status New    Target Date 08/17/21      PT LONG TERM GOAL #3   Title Pt will demo >19/56 on BBS to improve functional balance and reduce fall risk    Baseline 13/56 (06/22/21)    Time 8    Period Weeks    Status New    Target Date 08/17/21                   Plan - 07/25/21 1236     Clinical Impression Statement Today's skilled session continued to focus on LE/core strengthening and gait with prosthesis/RW with emphasis on tall posture and equal step lenght with gait. Pt able to correct with cues. The pt is progressing toward goals and should benefit from continued PT to progress toward unmet goals.    Personal Factors and Comorbidities Age;Comorbidity 3+    Comorbidities ESRD on HD TTS, CAD, PAD, chronic systolic heart failure, COPD, insulin-dependent diabetes, history of right BKA    Examination-Activity Limitations Bathing;Bed Mobility;Carry;Dressing;Hygiene/Grooming;Lift;Self Feeding;Sleep;Squat;Stairs;Stand;Toileting;Transfers    Examination-Participation Restrictions Cleaning;Community  Activity;Laundry;Medication Management;Meal Prep;Occupation;Shop;Church    Stability/Clinical Decision Making Stable/Uncomplicated    Rehab Potential Good    PT Frequency 2x / week  PT Duration 8 weeks    PT Treatment/Interventions ADLs/Self Care Home Management;Cryotherapy;Moist Heat;Gait training;Stair training;Functional mobility training;Therapeutic activities;Therapeutic exercise;Balance training;Neuromuscular re-education;Patient/family education;Orthotic Fit/Training;Prosthetic Training;Manual techniques;Scar mobilization;Passive range of motion;Energy conservation;Joint Manipulations    PT Next Visit Plan continue gait training focused on ireased ambulation distance. Static Standing Balance, Weight Shift Activities    PT Home Exercise Plan Access Code: [email or web address]    Consulted and Agree with Plan of Care Patient             Patient will benefit from skilled therapeutic intervention in order to improve the following deficits and impairments:  Abnormal gait, Decreased activity tolerance, Decreased balance, Decreased coordination, Decreased endurance, Decreased mobility, Decreased range of motion, Decreased safety awareness, Difficulty walking, Decreased strength, Increased fascial restricitons, Impaired flexibility, Improper body mechanics, Postural dysfunction, Prosthetic Dependency  Visit Diagnosis: Unsteadiness on feet  Muscle weakness (generalized)  Other abnormalities of gait and mobility     Problem List Patient Active Problem List   Diagnosis Date Noted   Diabetes mellitus type 2, uncontrolled, with complications ([place]) 0000000   Diabetic nephropathy ([place]) 0000000   Chronic systolic CHF (congestive heart failure) ([place]) 04/16/2021   Hypoglycemia 04/15/2021   Hyperglycemia 04/15/2021   DKA (diabetic ketoacidosis) ([place]) 01/24/2021   COVID-19 virus infection 01/24/2021   Syncope 01/24/2021   ESRD (end stage renal disease) ([place]) 01/24/2021   Congestive heart  failure (CHF) ([place]) 01/24/2021   Hypotension 01/24/2021   Shock ([place]) 01/24/2021    Willow Ora, PTA, [facility] [address] [place], [place] [address] [phone] 07/25/21, 7:10 PM   Name: Jose Robinson MRN: [id number] Date of Birth: [birth year]

## 2021-07-27 ENCOUNTER — Other Ambulatory Visit: Payer: Self-pay

## 2021-07-27 ENCOUNTER — Encounter: Payer: Self-pay | Admitting: Physical Therapy

## 2021-07-27 ENCOUNTER — Ambulatory Visit: Payer: Medicare (Managed Care) | Admitting: Physical Therapy

## 2021-07-27 ENCOUNTER — Ambulatory Visit: Payer: Medicare (Managed Care) | Admitting: Occupational Therapy

## 2021-07-27 DIAGNOSIS — R2681 Unsteadiness on feet: Secondary | ICD-10-CM

## 2021-07-27 DIAGNOSIS — R2689 Other abnormalities of gait and mobility: Secondary | ICD-10-CM

## 2021-07-27 DIAGNOSIS — M6281 Muscle weakness (generalized): Secondary | ICD-10-CM

## 2021-07-27 NOTE — Therapy (Signed)
[facility] [address] [place], [place], [address] Phone: [phone]   Fax:  [phone]  Physical Therapy Treatment  Patient Details  Name: Jose Robinson MRN: [id number] Date of Birth: [birth year] Referring Provider (PT): Buzzy Han, MD   Encounter Date: 07/27/2021   PT End of Session - 07/27/21 0940     Visit Number 7    Number of Visits 11    Date for PT Re-Evaluation 08/31/21    Authorization Type [employer] Medicare/Medicaid (follow Medicare guidelines)    Progress Note Due on Visit 10    PT Start Time 0935    PT Stop Time 1015    PT Time Calculation (min) 40 min    Equipment Utilized During Treatment Gait belt    Activity Tolerance Patient tolerated treatment well    Behavior During Therapy WFL for tasks assessed/performed             Past Medical History:  Diagnosis Date   Diabetes mellitus without complication ([place])    ESRD on hemodialysis ([place])    Hypertension     Past Surgical History:  Procedure Laterality Date   BACK SURGERY     BELOW KNEE LEG AMPUTATION      There were no vitals filed for this visit.   Subjective Assessment - 07/27/21 1058     Subjective No new complaints. No falls. Does report his BP meds were increased, med list updated.    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 no issues    How long can you stand comfortably? <30 sec with walker    How long can you walk comfortably? within his room    Patient Stated Goals Improve balance    Currently in Pain? No/denies    Pain Score 0-No pain                    [facility] Adult PT Treatment/Exercise - 07/27/21 0942       Transfers   Transfers Sit to Stand;Stand to Sit    Sit to Stand 5: Supervision;With upper extremity assist;From bed;From chair/3-in-1    Stand to Sit 5:  Supervision;With upper extremity assist;To bed;To chair/3-in-1      Ambulation/Gait   Ambulation/Gait Yes    Ambulation/Gait Assistance 4: Min guard    Ambulation/Gait Assistance Details cues for more upright posture/hipe extension and to stay within walker with gait.    Ambulation Distance (Feet) 40 Feet   x1, 150 x1   Assistive device Rolling walker;Prosthesis    Gait Pattern Step-through pattern;Decreased stride length;Decreased hip/knee flexion - right;Decreased hip/knee flexion - left;Trunk flexed;Narrow base of support    Ambulation Surface Level;Indoor      Self-Care   Self-Care Other Self-Care Comments    Other Self-Care Comments  pt's cushion inflated by PT to be more supportive. Pt also reports having new wheelchair at home from [employer] that is too small . Pt is to bring this wheelchair to next session for therapy to check it out. [employer] has told him they will replace it with a larger one with a MD order. Once wheelchair is assessed in therapy if a larger one is deemed needed therapy can reach to to MD to help facilitate this process.      Knee/Hip Exercises: Aerobic   Other Aerobic BP before 103/47. HR 88 Scifit Ue/LE's level 2.5 x 8  minutes with goal >/= 60 steps per minute for strengthening and activity tolerance. BP 98/63, hr 87 afterwards.                 Balance Exercises - 07/27/21 1011       Balance Exercises: Standing   Standing Eyes Closed Wide (BOA);Solid surface;Other reps (comment);10 secs;Limitations    Standing Eyes Closed Limitations standing on floor, letting go of RW to work on static standing balance with eyes closed. min to mod assist as pt tend to have forward lean/balance loss with vision removed. worked on this in bloc practice with max time with eyes closed 10 seconds.                 PT Short Term Goals - 07/18/21 1300       PT SHORT TERM GOAL #1   Title Pt will demo 16/56 on BBS to decrease fall risk and improve walking endurance     Baseline 13/56 (06/22/21); 14/56 (07/18/21)    Time 4    Period Weeks    Status On-going    Target Date 07/20/21      PT SHORT TERM GOAL #2   Title Pt will demo <38 sec with TUG wit RW to improve functional mobility    Baseline 43 sec with RW (06/22/21); 36 sec with RW (07/18/21)    Time 4    Period Weeks    Status Achieved    Target Date 07/20/21               PT Long Term Goals - 06/22/21 1029       PT LONG TERM GOAL #1   Title Pt will demo at least 0.15 m/s improvement in his gait speed with RW to improve functional mobility    Baseline 0.41ms with RW (06/22/21)    Time 8    Period Weeks    Status New    Target Date 08/17/21      PT LONG TERM GOAL #2   Title Pt will demo <35 sec with TUG with RW to improve functional mobility and decrease fall risk    Baseline 43 sec with RW (06/22/21)    Time 8    Period Weeks    Status New    Target Date 08/17/21      PT LONG TERM GOAL #3   Title Pt will demo >19/56 on BBS to improve functional balance and reduce fall risk    Baseline 13/56 (06/22/21)    Time 8    Period Weeks    Status New    Target Date 08/17/21                   Plan - 07/27/21 0941     Clinical Impression Statement Today's skilled session continued to focus on strengthening and gait with RW. Pt able to increase his overall gait distance this session. Also worked on unsupported standing balance progressing to vision removed with up to mod assist needed due to anterior lean/balance loss. Pt will need additional practice with this before adding to HEP. The pt is making steady progress and should benefit from continued PT to progress toward unmet goals.    Personal Factors and Comorbidities Age;Comorbidity 3+    Comorbidities ESRD on HD TTS, CAD, PAD, chronic systolic heart failure, COPD, insulin-dependent diabetes, history of right BKA    Examination-Activity Limitations Bathing;Bed Mobility;Carry;Dressing;Hygiene/Grooming;Lift;Self  Feeding;Sleep;Squat;Stairs;Stand;Toileting;Transfers    Examination-Participation Restrictions Cleaning;Community Activity;Laundry;Medication Management;Meal Prep;Occupation;Shop;Church  Stability/Clinical Decision Making Stable/Uncomplicated    Rehab Potential Good    PT Frequency 2x / week    PT Duration 8 weeks    PT Treatment/Interventions ADLs/Self Care Home Management;Cryotherapy;Moist Heat;Gait training;Stair training;Functional mobility training;Therapeutic activities;Therapeutic exercise;Balance training;Neuromuscular re-education;Patient/family education;Orthotic Fit/Training;Prosthetic Training;Manual techniques;Scar mobilization;Passive range of motion;Energy conservation;Joint Manipulations    PT Next Visit Plan continue gait training focused on ireased ambulation distance. Static Standing Balance, Weight Shift Activities    PT Home Exercise Plan Access Code: [email or web address]    Consulted and Agree with Plan of Care Patient             Patient will benefit from skilled therapeutic intervention in order to improve the following deficits and impairments:  Abnormal gait, Decreased activity tolerance, Decreased balance, Decreased coordination, Decreased endurance, Decreased mobility, Decreased range of motion, Decreased safety awareness, Difficulty walking, Decreased strength, Increased fascial restricitons, Impaired flexibility, Improper body mechanics, Postural dysfunction, Prosthetic Dependency  Visit Diagnosis: Unsteadiness on feet  Muscle weakness (generalized)  Other abnormalities of gait and mobility     Problem List Patient Active Problem List   Diagnosis Date Noted   Diabetes mellitus type 2, uncontrolled, with complications ([place]) 0000000   Diabetic nephropathy ([place]) 0000000   Chronic systolic CHF (congestive heart failure) ([place]) 04/16/2021   Hypoglycemia 04/15/2021   Hyperglycemia 04/15/2021   DKA (diabetic ketoacidosis) ([place]) 01/24/2021   COVID-19 virus  infection 01/24/2021   Syncope 01/24/2021   ESRD (end stage renal disease) ([place]) 01/24/2021   Congestive heart failure (CHF) ([place]) 01/24/2021   Hypotension 01/24/2021   Shock ([place]) 01/24/2021    Willow Ora, PTA, [facility] [address] [place], [place] [address] [phone] 07/27/21, 10:58 AM   Name: Jose Robinson MRN: [id number] Date of Birth: 1956/03/30

## 2021-07-31 ENCOUNTER — Other Ambulatory Visit (HOSPITAL_COMMUNITY): Payer: Self-pay | Admitting: Nephrology

## 2021-07-31 DIAGNOSIS — N186 End stage renal disease: Secondary | ICD-10-CM

## 2021-08-01 ENCOUNTER — Encounter (HOSPITAL_COMMUNITY): Payer: Self-pay

## 2021-08-01 ENCOUNTER — Ambulatory Visit: Payer: Medicare (Managed Care) | Admitting: Occupational Therapy

## 2021-08-01 ENCOUNTER — Emergency Department (HOSPITAL_COMMUNITY): Payer: Medicare (Managed Care)

## 2021-08-01 ENCOUNTER — Ambulatory Visit (HOSPITAL_COMMUNITY)
Admission: RE | Admit: 2021-08-01 | Discharge: 2021-08-01 | Disposition: A | Discharge status: Home / Self Care | Payer: Medicare (Managed Care) | Source: Ambulatory Visit | Attending: Nephrology | Admitting: Nephrology

## 2021-08-01 ENCOUNTER — Other Ambulatory Visit: Payer: Self-pay

## 2021-08-01 ENCOUNTER — Emergency Department (HOSPITAL_COMMUNITY)
Admission: EM | Admit: 2021-08-01 | Discharge: 2021-08-01 | Disposition: A | Discharge status: Home / Self Care | Payer: Medicare (Managed Care) | Attending: Emergency Medicine | Admitting: Emergency Medicine

## 2021-08-01 ENCOUNTER — Ambulatory Visit: Payer: Medicare (Managed Care)

## 2021-08-01 DIAGNOSIS — R197 Diarrhea, unspecified: Secondary | ICD-10-CM | POA: Insufficient documentation

## 2021-08-01 DIAGNOSIS — R10817 Generalized abdominal tenderness: Secondary | ICD-10-CM | POA: Diagnosis not present

## 2021-08-01 DIAGNOSIS — I5022 Chronic systolic (congestive) heart failure: Secondary | ICD-10-CM | POA: Insufficient documentation

## 2021-08-01 DIAGNOSIS — E1122 Type 2 diabetes mellitus with diabetic chronic kidney disease: Secondary | ICD-10-CM | POA: Diagnosis not present

## 2021-08-01 DIAGNOSIS — E1110 Type 2 diabetes mellitus with ketoacidosis without coma: Secondary | ICD-10-CM | POA: Insufficient documentation

## 2021-08-01 DIAGNOSIS — R1110 Vomiting, unspecified: Secondary | ICD-10-CM | POA: Diagnosis not present

## 2021-08-01 DIAGNOSIS — Z794 Long term (current) use of insulin: Secondary | ICD-10-CM | POA: Insufficient documentation

## 2021-08-01 DIAGNOSIS — Z8616 Personal history of COVID-19: Secondary | ICD-10-CM | POA: Diagnosis not present

## 2021-08-01 DIAGNOSIS — I132 Hypertensive heart and chronic kidney disease with heart failure and with stage 5 chronic kidney disease, or end stage renal disease: Secondary | ICD-10-CM | POA: Diagnosis not present

## 2021-08-01 DIAGNOSIS — N186 End stage renal disease: Secondary | ICD-10-CM | POA: Insufficient documentation

## 2021-08-01 DIAGNOSIS — Z992 Dependence on renal dialysis: Secondary | ICD-10-CM | POA: Insufficient documentation

## 2021-08-01 DIAGNOSIS — Z7982 Long term (current) use of aspirin: Secondary | ICD-10-CM | POA: Insufficient documentation

## 2021-08-01 DIAGNOSIS — F17200 Nicotine dependence, unspecified, uncomplicated: Secondary | ICD-10-CM | POA: Insufficient documentation

## 2021-08-01 DIAGNOSIS — R079 Chest pain, unspecified: Secondary | ICD-10-CM | POA: Insufficient documentation

## 2021-08-01 LAB — CBC WITH DIFFERENTIAL/PLATELET
Abs Immature Granulocytes: 0.02 10*3/uL (ref 0.00–0.07)
Basophils Absolute: 0 10*3/uL (ref 0.0–0.1)
Basophils Relative: 0 %
Eosinophils Absolute: 0 10*3/uL (ref 0.0–0.5)
Eosinophils Relative: 0 %
HCT: 55.4 % — ABNORMAL HIGH (ref 39.0–52.0)
Hemoglobin: 17.7 g/dL — ABNORMAL HIGH (ref 13.0–17.0)
Immature Granulocytes: 0 %
Lymphocytes Relative: 15 %
Lymphs Abs: 0.8 10*3/uL (ref 0.7–4.0)
MCH: 29.8 pg (ref 26.0–34.0)
MCHC: 31.9 g/dL (ref 30.0–36.0)
MCV: 93.3 fL (ref 80.0–100.0)
Monocytes Absolute: 0.5 10*3/uL (ref 0.1–1.0)
Monocytes Relative: 10 %
Neutro Abs: 3.7 10*3/uL (ref 1.7–7.7)
Neutrophils Relative %: 75 %
Platelets: 188 10*3/uL (ref 150–400)
RBC: 5.94 MIL/uL — ABNORMAL HIGH (ref 4.22–5.81)
RDW: 15 % (ref 11.5–15.5)
WBC: 5.1 10*3/uL (ref 4.0–10.5)
nRBC: 0 % (ref 0.0–0.2)

## 2021-08-01 LAB — COMPREHENSIVE METABOLIC PANEL
ALT: 21 U/L (ref 0–44)
AST: 20 U/L (ref 15–41)
Albumin: 3.4 g/dL — ABNORMAL LOW (ref 3.5–5.0)
Alkaline Phosphatase: 70 U/L (ref 38–126)
Anion gap: 14 (ref 5–15)
BUN: 22 mg/dL (ref 8–23)
CO2: 26 mmol/L (ref 22–32)
Calcium: 9.2 mg/dL (ref 8.9–10.3)
Chloride: 92 mmol/L — ABNORMAL LOW (ref 98–111)
Creatinine, Ser: 7.55 mg/dL — ABNORMAL HIGH (ref 0.61–1.24)
GFR, Estimated: 8 mL/min — ABNORMAL LOW (ref >60)
Glucose, Bld: 145 mg/dL — ABNORMAL HIGH (ref 70–99)
Potassium: 4.8 mmol/L (ref 3.5–5.1)
Sodium: 132 mmol/L — ABNORMAL LOW (ref 135–145)
Total Bilirubin: 1 mg/dL (ref 0.3–1.2)
Total Protein: 7.5 g/dL (ref 6.5–8.1)

## 2021-08-01 LAB — TROPONIN I (HIGH SENSITIVITY): Troponin I (High Sensitivity): 15 ng/L (ref <18)

## 2021-08-01 LAB — LIPASE, BLOOD: Lipase: 35 U/L (ref 11–51)

## 2021-08-01 MED ORDER — FAMOTIDINE 20 MG PO TABS: 20.0000 mg | ORAL_TABLET | Freq: Every day | ORAL | 0 refills | Status: DC

## 2021-08-01 MED ORDER — OXYCODONE HCL 5 MG PO TABS
5.0000 mg | ORAL_TABLET | ORAL | Status: AC
Start: 2021-08-01 — End: 2021-08-01
  Administered 2021-08-01: 5 mg via ORAL
  Filled 2021-08-01: qty 1

## 2021-08-01 MED ORDER — ALUM & MAG HYDROXIDE-SIMETH 200-200-20 MG/5ML PO SUSP
30.0000 mL | Freq: Once | ORAL | Status: AC
  Administered 2021-08-01: 30 mL via ORAL
  Filled 2021-08-01: qty 30

## 2021-08-01 MED ORDER — FAMOTIDINE 20 MG PO TABS: 10.0000 mg | ORAL_TABLET | Freq: Every day | ORAL | 0 refills | Status: AC

## 2021-08-01 MED ORDER — FENTANYL CITRATE (PF) 100 MCG/2ML IJ SOLN
50.0000 ug | INTRAMUSCULAR | Status: DC | PRN
  Filled 2021-08-01: qty 2

## 2021-08-01 MED ORDER — ONDANSETRON 4 MG PO TBDP: 4.0000 mg | ORAL_TABLET | Freq: Three times a day (TID) | ORAL | 0 refills | Status: AC | PRN

## 2021-08-01 MED ORDER — ONDANSETRON HCL 4 MG/2ML IJ SOLN
4.0000 mg | Freq: Once | INTRAMUSCULAR | Status: AC
  Administered 2021-08-01: 4 mg via INTRAVENOUS
  Filled 2021-08-01: qty 2

## 2021-08-01 MED ORDER — FENTANYL CITRATE (PF) 100 MCG/2ML IJ SOLN: 100.0000 ug | INTRAMUSCULAR | Status: DC | PRN

## 2021-08-01 MED ORDER — ONDANSETRON 4 MG PO TBDP
8.0000 mg | ORAL_TABLET | Freq: Once | ORAL | Status: AC
  Administered 2021-08-01: 8 mg via ORAL
  Filled 2021-08-01: qty 2

## 2021-08-01 MED ORDER — LIDOCAINE VISCOUS HCL 2 % MT SOLN
15.0000 mL | Freq: Once | OROMUCOSAL | Status: AC
  Administered 2021-08-01: 15 mL via ORAL
  Filled 2021-08-01: qty 15

## 2021-08-01 NOTE — Discharge Instructions (Addendum)
Please follow-up with a gastroenterologist.  If you do not have a gastroenterologist please follow-up with either [facility] which I have put the information for in your discharge paperwork or follow-up with [facility] gastroenterology.  Please follow-up with your primary care provider.  Please continue to get dialysis at your scheduled intervals.  You may always return to the ER for any new or concerning symptoms.  It may be helpful to buy some over-the-counter Maalox.  I have prescribed you Pepcid which I would like you to take once daily.

## 2021-08-01 NOTE — ED Notes (Signed)
Report given to Noralyn Pick, RN

## 2021-08-01 NOTE — ED Provider Notes (Signed)
[facility] EMERGENCY DEPARTMENT Provider Note   CSN: [id number] Arrival date & time: 08/01/21  1229     History Chief Complaint  Patient presents with   Chest Pain    Pt arrived via [facility] c/c of chest pain with N/V x3. Pt is coming from [place] home. Pt states he has vomited x6 since yesterday and chest pain radiates down to stomach. Pt also states he has had diarrhea for past 3x days. Pt has also complaints of low intake PO for last 2 days.   NSR with PVC, 127/75, 86HR, BS 202 324 ASA     Jose Robinson is a 65 y.o. male.  HPI 3-4 days of sharp sternal CP rads to abdomen and entire abd pain began after pizza. Vomited numerous times yesterday/today/once 2 days ago.  No blood or bile in vomit that he saw.  Diarrhea x2. No recent abx/travel denies any bloody or bilious emesis.  Denies any blood in his stool.  He states the pain in his chest seems to be burning and achy.  Over the past 6 and half hours that he has been in the ER he has had no episodes of vomiting or diarrhea he states.  Denies any lightheadedness or dizziness.  No back or neck pain. No other associate symptoms.  No aggravating factors.  States that he does however have decreased appetite and feels it is related to him eating that it causes the burning pain.  Also some more supine down.    Past Medical History:  Diagnosis Date   Diabetes mellitus without complication ([place])    ESRD on hemodialysis ([place])    Hypertension     Patient Active Problem List   Diagnosis Date Noted   Diabetes mellitus type 2, uncontrolled, with complications ([place]) 74/14/2395   Diabetic nephropathy ([place]) 32/01/3342   Chronic systolic CHF (congestive heart failure) ([place]) 04/16/2021   Hypoglycemia 04/15/2021   Hyperglycemia 04/15/2021   DKA (diabetic ketoacidosis) ([place]) 01/24/2021   COVID-19 virus infection 01/24/2021   Syncope 01/24/2021   ESRD (end stage renal disease) ([place]) 01/24/2021   Congestive  heart failure (CHF) ([place]) 01/24/2021   Hypotension 01/24/2021   Shock ([place]) 01/24/2021    Past Surgical History:  Procedure Laterality Date   BACK SURGERY     BELOW KNEE LEG AMPUTATION         Family History  Family history unknown: Yes    Social History   Tobacco Use   Smoking status: Every Day   Smokeless tobacco: Never  Substance Use Topics   Alcohol use: Not Currently   Drug use: Not Currently    Home Medications Prior to Admission medications   Medication Sig Start Date End Date Taking? Authorizing Provider  aspirin 81 MG chewable tablet Chew 1 tablet (81 mg total) by mouth daily. 02/10/21  Yes Charlynne Cousins, MD  atorvastatin (LIPITOR) 20 MG tablet Take 1 tablet (20 mg total) by mouth daily. 02/10/21  Yes Charlynne Cousins, MD  blood glucose meter kit and supplies KIT Dispense based on patient and insurance preference. Use up to four times daily as directed. (FOR ICD-9 250.00, 250.01). Patient taking differently: Inject 1 each into the skin See admin instructions. Dispense based on patient and insurance preference. Use up to four times daily as directed. (FOR ICD-9 250.00, 250.01). 01/30/21  Yes Donne Hazel, MD  brimonidine [facility]) 0.2 % ophthalmic solution Place 1 drop into both eyes in the morning and at bedtime.  Yes [provider]  calcitRIOL (ROCALTROL) 0.5 MCG capsule Take 2 capsules (1 mcg total) by mouth Every Tuesday,Thursday,and Saturday with dialysis. 04/17/21  Yes [name], MD  calcium acetate (PHOSLO) 667 MG capsule Take 2 capsules (1,334 mg total) by mouth 3 (three) times daily with meals. 04/17/21  Yes [name], MD  calcium elemental as carbonate (CALCIUM ANTACID ULTRA STRENGTH) 400 MG chewable tablet Chew 2,000 mg by mouth daily.   Yes [provider]  donepezil (ARICEPT) 10 MG tablet Take 10 mg by mouth daily. 12/30/20  Yes [provider]  Dulaglutide (TRULICITY) 1.5 KX/3.8HW SOPN Inject 1.5 mg into the  skin every Thursday.   Yes [provider]  gabapentin (NEURONTIN) 100 MG capsule Take 200 mg by mouth 3 (three) times daily. 12/30/20  Yes [provider]  glucose 4 GM chewable tablet Chew 2 tablets (8 g total) by mouth 4 (four) times daily as needed for low blood sugar (for CBG<80). 04/17/21  Yes [name], MD  guaiFENesin-dextromethorphan (ROBITUSSIN DM) 100-10 MG/5ML syrup Take 10 mLs by mouth every 4 (four) hours as needed for cough (chest congestion). 04/17/21  Yes [name], MD  insulin aspart (NOVOLOG FLEXPEN) 100 UNIT/ML FlexPen Inject 3 Units into the skin 3 (three) times daily with meals as needed for high blood sugar (CBG>180). Patient taking differently: Inject 1-8 Units into the skin See admin instructions. Check FSBS before each meal at bedtime and inject per SSI: 0-150 = 0 units  150-200 = 1 unit  201 - 250 = 2 units  251 - 250 = 3 units  350 - 400 = 5 units  Greater than 400 = 8 units (Prime pen with 2 units prior to each use). 04/17/21  Yes [name], MD  Insulin Glargine [facility]) 100 UNIT/ML Inject 8 Units into the skin at bedtime.   Yes [provider]  latanoprost (XALATAN) 0.005 % ophthalmic solution Place 1 drop into both eyes at bedtime.   Yes [provider]  loperamide (IMODIUM A-D) 2 MG tablet Take 2-4 mg by mouth See admin instructions. 4 mg after 1st loose stool, then 2 mg after each loose stool. Max 4 tabs in 24 hours   Yes [provider]  loratadine (CLARITIN) 10 MG tablet Take 10 mg by mouth daily.   Yes [provider]  midodrine (PROAMATINE) 5 MG tablet Take 1 tablet (5 mg total) by mouth every Tuesday, Thursday, Saturday, and Sunday. 04/17/21  Yes [name], MD  naproxen sodium (ALEVE) 220 MG tablet Take 440 mg by mouth 2 (two) times daily as needed (pain/headache).   Yes [provider]  nystatin ointment (MYCOSTATIN) Apply 1 application topically daily.   Yes [provider]  ondansetron (ZOFRAN ODT) 4 MG disintegrating tablet Take 1 tablet (4 mg total) by mouth every 8 (eight) hours as needed for nausea or vomiting. 08/01/21  Yes Brighton Pilley S, PA  ondansetron (ZOFRAN) 4 MG tablet Take 4 mg by mouth every 8 (eight) hours as needed for nausea or vomiting. 02/02/18  Yes [provider]  Pancrelipase, Lip-Prot-Amyl, (ZENPEP) 5000-24000 units CPEP Take 1 capsule by mouth 3 (three) times daily with meals.   Yes [provider]  pantoprazole (PROTONIX) 40 MG tablet Take 1 tablet (40 mg total) by mouth daily. 04/17/21  Yes [name], MD  famotidine (PEPCID) 20 MG tablet Take 0.5 tablets (10 mg total) by mouth daily. 08/01/21  Korbin Mapps S, PA  lidocaine (LIDODERM) 5 % Place 1 patch onto the skin daily. Remove & Discard patch within 12 hours or as directed by MD Patient not taking: Reported on 08/01/2021 04/17/21   [name], MD    Allergies    Acetaminophen, Prednisone, and Ivp dye [iodinated diagnostic agents]  Review of Systems   Review of Systems  Constitutional:  Negative for chills and fever.  HENT:  Negative for congestion.   Eyes:  Negative for pain.  Respiratory:  Negative for cough and shortness of breath.   Cardiovascular:  Positive for chest pain. Negative for leg swelling.  Gastrointestinal:  Positive for abdominal pain, diarrhea, nausea and vomiting.  Genitourinary:  Negative for dysuria.  Musculoskeletal:  Negative for myalgias.  Skin:  Negative for rash.  Neurological:  Negative for dizziness and headaches.   Physical Exam Updated Vital Signs BP 123/84   Pulse 84   Temp 98.8 F (37.1 C) (Oral)   Resp 11   Ht _0  (1.803 m)   Wt 66.2 kg   SpO2 96%   BMI 20.36 kg/m   Physical Exam Vitals and nursing note reviewed.  Constitutional:      General: He is not in acute distress.    Comments: Chronically ill-appearing but not acutely toxic appearing 65 year old male.  Sitting comfortably in bed.   Does not appear uncomfortable at all.  Asleep when I initially entered the room but woke up to my entrance.  HENT:     Head: Normocephalic and atraumatic.     Nose: Nose normal.  Eyes:     General: No scleral icterus. Cardiovascular:     Rate and Rhythm: Normal rate and regular rhythm.     Pulses: Normal pulses.     Heart sounds: Normal heart sounds.  Pulmonary:     Effort: Pulmonary effort is normal. No respiratory distress.     Breath sounds: No wheezing.  Abdominal:     Palpations: Abdomen is soft.     Tenderness: There is abdominal tenderness.     Comments: Diffuse abdominal tenderness.  Patient has been auscultated with stethoscope.  He does not react to painful stimuli when palpated with stethoscope and hand.  Musculoskeletal:     Cervical back: Normal range of motion.     Left lower leg: No edema.     Comments: No left lower extremity edema.  Right lower extremity BKA  Skin:    General: Skin is warm and dry.     Capillary Refill: Capillary refill takes less than 2 seconds.  Neurological:     Mental Status: He is alert. Mental status is at baseline.  Psychiatric:        Mood and Affect: Mood normal.        Behavior: Behavior normal.    ED Results / Procedures / Treatments   Labs (all labs ordered are listed, but only abnormal results are displayed) Labs Reviewed  CBC WITH DIFFERENTIAL/PLATELET - Abnormal; Notable for the following components:      Result Value   RBC 5.94 (*)    Hemoglobin 17.7 (*)    HCT 55.4 (*)    All other components within normal limits  COMPREHENSIVE METABOLIC PANEL - Abnormal; Notable for the following components:   Sodium 132 (*)    Chloride 92 (*)    Glucose, Bld 145 (*)    Creatinine, Ser 7.55 (*)    Albumin 3.4 (*)    GFR, Estimated 8 (*)  All other components within normal limits  LIPASE, BLOOD  TROPONIN I (HIGH SENSITIVITY)    EKG None  Radiology CT ABDOMEN PELVIS WO CONTRAST  Result Date: 08/01/2021 CLINICAL DATA:   Nonlocalized abdominal pain. EXAM: CT ABDOMEN AND PELVIS WITHOUT CONTRAST TECHNIQUE: Multidetector CT imaging of the abdomen and pelvis was performed following the standard protocol without IV contrast. COMPARISON:  None. FINDINGS: Lower chest: The heart is normal in size. No pericardial effusion. Hemodialysis catheter noted in the right atrium. No aortic calcifications. Diffuse distal esophageal wall thickening suspicious for esophagitis. No hiatal hernia or obvious mass but recommend clinical correlation with any symptoms of dysphagia. Esophagram or endoscopy may be helpful for further evaluation. Hepatobiliary: No hepatic lesions are identified without contrast. No intrahepatic biliary dilatation. Contracted versus is absent gallbladder. Moderate common bile duct dilatation measuring up to 12 mm. Recommend correlation with liver function studies. Pancreas: No mass, inflammation or ductal dilatation. Spleen: Normal size.  No focal lesions. Adrenals/Urinary Tract: Adrenal glands are unremarkable. Small atrophic kidneys consistent with chronic diabetic nephropathy and end-stage renal disease. Small scattered renal cysts. No worrisome renal lesions. The bladder is grossly normal. Stomach/Bowel: With stomach, duodenum, small bowel and colon are grossly normal without oral contrast. No acute inflammatory process or obstructive findings. The terminal ileum and appendix are normal. Vascular/Lymphatic: Advanced vascular calcifications but no aneurysm. No mesenteric or retroperitoneal mass or adenopathy. Reproductive: The prostate gland and seminal vesicles are unremarkable. The penile prosthesis is noted. Other: No pelvic mass or adenopathy. No free pelvic fluid collections. No inguinal mass or adenopathy. No abdominal wall hernia or subcutaneous lesions. Musculoskeletal: No acute bony findings. Changes of renal osteodystrophy are noted. Prior lumbar fusion noted with hardware at L3-4. Advanced T7-8 degenerative changes  are noted could not exclude amyloid spondyloarthropathy. IMPRESSION: 1. Diffuse distal esophageal wall thickening suspicious for esophagitis. No hiatal hernia or obvious mass but recommend clinical correlation with any symptoms of dysphagia. Esophagram or endoscopy may be helpful for further evaluation. 2. No acute abdominal/pelvic findings, mass lesions or adenopathy. 3. Small atrophic kidneys consistent with chronic diabetic nephropathy and end-stage renal disease. 4. Advanced vascular calcifications. 5. Changes of renal osteodystrophy. 6. Advanced T7-8 degenerative changes could not exclude amyloid spondyloarthropathy. Electronically Signed   By: Marijo Sanes M.D.   On: 08/01/2021 14:41   DG Chest 2 View  Result Date: 08/01/2021 CLINICAL DATA:  Midline chest pain. Nausea and vomiting since yesterday and diarrhea. EXAM: CHEST - 2 VIEW COMPARISON:  04/15/2021 FINDINGS: The left IJ dialysis catheter is in good position, unchanged. Bilateral subclavian vein stents are noted. The cardiac silhouette, mediastinal and hilar contours are within normal limits. The lungs are clear. No infiltrates, edema or effusions. The bony thorax is intact. IMPRESSION: No acute cardiopulmonary findings. Electronically Signed   By: Marijo Sanes M.D.   On: 08/01/2021 14:28    Procedures Ultrasound ED Peripheral IV (Provider)  Date/Time: 08/01/2021 5:28 PM Performed by: Tedd Sias, PA Authorized by: Tedd Sias, PA   Procedure details:    Indications: multiple failed IV attempts and poor IV access     Skin Prep: chlorhexidine gluconate     Location:  Right AC   Angiocath:  20 G   Bedside Ultrasound Guided: Yes     Images: not archived     Patient tolerated procedure without complications: Yes     Dressing applied: Yes     Medications Ordered in ED Medications  fentaNYL (SUBLIMAZE) injection 50 mcg (0 mcg Intravenous  Hold 08/01/21 1624)  alum & mag hydroxide-simeth (MAALOX/MYLANTA) 200-200-20 MG/5ML  suspension 30 mL (30 mLs Oral Given 08/01/21 1418)    And  lidocaine (XYLOCAINE) 2 % viscous mouth solution 15 mL (15 mLs Oral Given 08/01/21 1418)  ondansetron (ZOFRAN) injection 4 mg (4 mg Intravenous Given 08/01/21 1418)  oxyCODONE (Oxy IR/ROXICODONE) immediate release tablet 5 mg (5 mg Oral Given 08/01/21 1901)    ED Course  I have reviewed the triage vital signs and the nursing notes.  Pertinent labs & imaging results that were available during my care of the patient were reviewed by me and considered in my medical decision making (see chart for details).  Clinical Course as of 08/01/21 1911  Wed Aug 01, 2021  1320 3-4 days of sharp sternal CP rads to abdomen and entire abd pain began after pizza. Vomited numerous times yesterday/today/once 2 days ago.  No blood or bile in vomit that he saw.  Diarrhea x2. No recent abx/travel [WF]    Clinical Course User Index [WF] Tedd Sias, [place]   MDM Rules/Calculators/A&P                           Patient is a 65 year old male history of dialysis more detailed past medical history detailed in HPI  Physical exam notable for abdominal tenderness generalized with no focal tenderness however when using a stethoscope to auscultate patient I palpated patient's abdomen which elicited no discomfort.  CMP unremarkable no changes from prior.  Patient has elevated creatinine consistent with his condition.  Lipase of normal limits.  Troponin x1 within normal limits.  His pain has been ongoing since this morning.  Over the 6 hours  CBC with mild erythrocytosis.  No leukocytosis.  CT abdomen pelvis without abnormality apart from some questionable marginal thickening of the distal esophagus which may be evidence of esophagitis.  Recommend Maalox and given GI cocktail, 1 Roxicodone 1 Zofran sent home with Pepcid, Zofran and recommendation follow-up with gastroenterology.  Chest x-ray unremarkable.  EKG unremarkable. EKG reviewed my myself and attending physician.    Patient is ambulatory at time of discharge.  Tolerated p.o.  Overall well-appearing.  Final Clinical Impression(s) / ED Diagnoses Final diagnoses:  Chest pain, unspecified type    Rx / DC Orders ED Discharge Orders          Ordered    famotidine (PEPCID) 20 MG tablet  Daily,   Status:  Discontinued        08/01/21 1856    famotidine (PEPCID) 20 MG tablet  Daily        08/01/21 1857    ondansetron (ZOFRAN ODT) 4 MG disintegrating tablet  Every 8 hours PRN        08/01/21 1908             Tedd Sias, [place] 08/01/21 1914    Godfrey Pick, MD 08/03/21 [phone]

## 2021-08-01 NOTE — ED Notes (Signed)
Ptar called pt is number 10 on the list 

## 2021-08-01 NOTE — ED Triage Notes (Signed)
Pt arrived via [facility] c/c of chest pain with N/V x3. Pt is coming from [place] home. Pt states he has vomited x6 since yesterday and chest pain radiates down to stomach. Pt also states he has had diarrhea for past 3x days. Pt has also complaints of low intake PO for last 2 days.   NSR with PVC, 127/75, 86HR, BS 202 324 ASA

## 2021-08-03 ENCOUNTER — Ambulatory Visit: Payer: Medicare (Managed Care) | Attending: Family Medicine | Admitting: Occupational Therapy

## 2021-08-03 ENCOUNTER — Other Ambulatory Visit: Payer: Self-pay

## 2021-08-03 ENCOUNTER — Encounter: Payer: Self-pay | Admitting: Occupational Therapy

## 2021-08-03 DIAGNOSIS — Z89511 Acquired absence of right leg below knee: Secondary | ICD-10-CM | POA: Diagnosis present

## 2021-08-03 DIAGNOSIS — R2681 Unsteadiness on feet: Secondary | ICD-10-CM | POA: Diagnosis present

## 2021-08-03 DIAGNOSIS — M25642 Stiffness of left hand, not elsewhere classified: Secondary | ICD-10-CM | POA: Insufficient documentation

## 2021-08-03 DIAGNOSIS — M25621 Stiffness of right elbow, not elsewhere classified: Secondary | ICD-10-CM

## 2021-08-03 DIAGNOSIS — R2689 Other abnormalities of gait and mobility: Secondary | ICD-10-CM | POA: Insufficient documentation

## 2021-08-03 DIAGNOSIS — M6281 Muscle weakness (generalized): Secondary | ICD-10-CM | POA: Diagnosis present

## 2021-08-03 DIAGNOSIS — M25641 Stiffness of right hand, not elsewhere classified: Secondary | ICD-10-CM | POA: Insufficient documentation

## 2021-08-03 DIAGNOSIS — M25622 Stiffness of left elbow, not elsewhere classified: Secondary | ICD-10-CM | POA: Diagnosis present

## 2021-08-03 NOTE — Therapy (Signed)
[facility] [address] [place], [place], [address] Phone: [phone]   Fax:  [phone]  Occupational Therapy Treatment  Patient Details  Name: Jose Robinson MRN: [id number] Date of Birth: 10/10/1956 Referring Provider (OT): Tobias Alexander   Encounter Date: 08/03/2021   OT End of Session - 08/03/21 0939     Visit Number 13    Number of Visits 17    Date for OT Re-Evaluation 08/20/21    Authorization Type [employer]/ Medicaid    OT Start Time 0930    OT Stop Time 1015    OT Time Calculation (min) 45 min    Activity Tolerance Patient tolerated treatment well    Behavior During Therapy WFL for tasks assessed/performed             Past Medical History:  Diagnosis Date   Diabetes mellitus without complication ([place])    ESRD on hemodialysis ([place])    Hypertension     Past Surgical History:  Procedure Laterality Date   BACK SURGERY     BELOW KNEE LEG AMPUTATION      There were no vitals filed for this visit.   Subjective Assessment - 08/03/21 0937     Subjective  "I was in the hospital they said they think I have GERD but I have been throwing up all week and i haven't slept in 4 days."    Pertinent History Rt BKA, DM, neuropathy    Currently in Pain? Yes    Pain Score 9     Pain Location Abdomen   all over   Pain Orientation Anterior    Pain Descriptors / Indicators Sharp    Pain Type Acute pain    Pain Onset More than a month ago    Pain Frequency Constant    Aggravating Factors  laying certain position    Pain Relieving Factors sit up straight                          OT Treatments/Exercises (OP) - 08/03/21 0945       Exercises   Exercises Hand      Hand Exercises   Other Hand Exercises self PROM for BUE composite flexion and extension  grasp and release with LUE and color dowel pegs with no difficulty (velcro side down) - pt placed back in board with min  difficulty    Other Hand Exercises hand gripper level 1 with black spring with RUE and mod drop and mod difficulty with picking up 1 inch blocks                      OT Short Term Goals - 07/18/21 1150       OT SHORT TERM GOAL #1   Title Patient will complete an HEP designed to improve range of motion in bilateral hands, forearms, and elbows    Baseline No HEP    Time 4    Period Weeks    Status Achieved      OT SHORT TERM GOAL #2   Title Patient will complete an HEP designed to build upper body strength    Baseline No HEP    Time 4    Period Weeks    Status Achieved      OT SHORT TERM GOAL #3   Title Patient will don pull over shirt with set up assistance    Baseline per patient mod assist  at times    Time 4    Period Weeks    Status Achieved      OT SHORT TERM GOAL #4   Title Patient will verbalize understanding of process to don prosthesis for RLE    Baseline Dependent    Time 4    Period Weeks    Status Achieved      OT SHORT TERM GOAL #5   Title Patient will demonstrate ability to cut food on plate with set up assistance    Time 4    Period Weeks    Status Achieved               OT Long Term Goals - 08/03/21 0940       OT LONG TERM GOAL #1   Title Patient will complete updated HEP for BUE rane, strength and functional use.    Baseline No HEP    Period Weeks    Status On-going      OT LONG TERM GOAL #2   Title Patient will don elastic waist shorts or pants with min assist    Baseline dependent    Time 8    Period Weeks    Status On-going      OT LONG TERM GOAL #3   Title Patient will demonstrate 5 lb increase in grip strength in BUE to aide in helping to don/doff prosthesis    Baseline less than 10 lbs on eval.  7/20 - 12lb right 10 lbs left    Time 8    Period Weeks    Status On-going      OT LONG TERM GOAL #4   Title Patient will toilet himself with no more than min assist    Baseline total assistance    Time 8    Period  Weeks    Status On-going      OT LONG TERM GOAL #5   Title Patient will demonstrate 90% composite flexion in BUE to aide in functional grasp    Baseline 50% left, 75% right    Time 8    Period Weeks    Status On-going                   Plan - 08/03/21 1011     Clinical Impression Statement Pt still recovering from ED visit this week and fatigued during session. Pt did well with BUE strengthening and coordination activities this day at table top.    OT Occupational Profile and History Detailed Assessment- Review of Records and additional review of physical, cognitive, psychosocial history related to current functional performance    Occupational performance deficits (Please refer to evaluation for details): ADL's    Body Structure / Function / Physical Skills ADL;Dexterity;Flexibility;ROM;Strength;IADL;FMC;Edema;Coordination;Balance;Body mechanics;Decreased knowledge of precautions;Endurance;Pain;Sensation;UE functional use;Skin integrity;Mobility;GMC;Fascial restriction;Decreased knowledge of use of DME    Rehab Potential Fair    Clinical Decision Making Several treatment options, min-mod task modification necessary    Comorbidities Affecting Occupational Performance: May have comorbidities impacting occupational performance    Modification or Assistance to Complete Evaluation  Min-Moderate modification of tasks or assist with assess necessary to complete eval    OT Frequency 2x / week    OT Duration 8 weeks    OT Treatment/Interventions Self-care/ADL training;Therapeutic exercise;DME and/or AE instruction;Functional Mobility Training;Balance training;Fluidtherapy;Neuromuscular education;Manual Therapy;Scar mobilization;Splinting;Patient/family education;Therapeutic activities;Passive range of motion    Plan work on hand strength (to use sock aide and for functional tasks), standing balance with prosthetic    Consulted  and Agree with Plan of Care Patient             Patient  will benefit from skilled therapeutic intervention in order to improve the following deficits and impairments:   Body Structure / Function / Physical Skills: ADL, Dexterity, Flexibility, ROM, Strength, IADL, FMC, Edema, Coordination, Balance, Body mechanics, Decreased knowledge of precautions, Endurance, Pain, Sensation, UE functional use, Skin integrity, Mobility, GMC, Fascial restriction, Decreased knowledge of use of DME       Visit Diagnosis: Unsteadiness on feet  Muscle weakness (generalized)  Other abnormalities of gait and mobility  Stiffness of left hand, not elsewhere classified  Stiffness of left elbow, not elsewhere classified  Stiffness of right hand, not elsewhere classified  Stiffness of right elbow, not elsewhere classified    Problem List Patient Active Problem List   Diagnosis Date Noted   Diabetes mellitus type 2, uncontrolled, with complications ([place]) 0000000   Diabetic nephropathy ([place]) 0000000   Chronic systolic CHF (congestive heart failure) ([place]) 04/16/2021   Hypoglycemia 04/15/2021   Hyperglycemia 04/15/2021   DKA (diabetic ketoacidosis) ([place]) 01/24/2021   COVID-19 virus infection 01/24/2021   Syncope 01/24/2021   ESRD (end stage renal disease) ([place]) 01/24/2021   Congestive heart failure (CHF) ([place]) 01/24/2021   Hypotension 01/24/2021   Shock ([place]) 01/24/2021    Zachery Conch MOT, OTR/L  08/03/2021, 10:12 AM  [facility] [address] [place], [place], [address] Phone: [phone]   Fax:  [phone]  Name: Jose Robinson MRN: [id number] Date of Birth: 1956-06-23

## 2021-08-08 ENCOUNTER — Ambulatory Visit: Payer: Medicare (Managed Care)

## 2021-08-08 ENCOUNTER — Ambulatory Visit: Payer: Medicare (Managed Care) | Admitting: Occupational Therapy

## 2021-08-10 ENCOUNTER — Ambulatory Visit: Payer: Medicare (Managed Care) | Admitting: Physical Therapy

## 2021-08-10 ENCOUNTER — Other Ambulatory Visit: Payer: Self-pay

## 2021-08-10 ENCOUNTER — Encounter: Payer: Self-pay | Admitting: Physical Therapy

## 2021-08-10 DIAGNOSIS — R2689 Other abnormalities of gait and mobility: Secondary | ICD-10-CM

## 2021-08-10 DIAGNOSIS — M6281 Muscle weakness (generalized): Secondary | ICD-10-CM

## 2021-08-10 DIAGNOSIS — R2681 Unsteadiness on feet: Secondary | ICD-10-CM

## 2021-08-10 NOTE — Therapy (Signed)
[facility] [address] [place], [place], [address] Phone: [phone]   Fax:  [phone]  Physical Therapy Treatment  Patient Details  Name: Jose Robinson MRN: [id number] Date of Birth: 08-Oct-1956 Referring Provider (PT): Buzzy Han, MD   Encounter Date: 08/10/2021   PT End of Session - 08/10/21 1238     Visit Number 8    Number of Visits 11    Date for PT Re-Evaluation 08/31/21    Authorization Type [employer] Medicare/Medicaid (follow Medicare guidelines)    Progress Note Due on Visit 10    PT Start Time 1234    PT Stop Time 1315    PT Time Calculation (min) 41 min    Equipment Utilized During Treatment Gait belt    Activity Tolerance Patient tolerated treatment well    Behavior During Therapy WFL for tasks assessed/performed             Past Medical History:  Diagnosis Date   Diabetes mellitus without complication ([place])    ESRD on hemodialysis ([place])    Hypertension     Past Surgical History:  Procedure Laterality Date   BACK SURGERY     BELOW KNEE LEG AMPUTATION      There were no vitals filed for this visit.   Subjective Assessment - 08/10/21 1236     Subjective Was sick with Jerrye Bushy. Was treated for it. Feels he is getting better, starting to get his appetite back. Was having trouble sleeping with not feeling well. Sleeping has improved as of yesterday. No falls.    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 no issues    How long can you stand comfortably? <30 sec with walker    How long can you walk comfortably? within his room    Patient Stated Goals Improve balance    Currently in Pain? Yes    Pain Score 9     Pain Location Generalized   " my whole body"   Pain Descriptors / Indicators Sharp    Pain Type Acute pain;Chronic pain    Pain Radiating  Towards stomach    Pain Onset More than a month ago    Pain Frequency Constant    Aggravating Factors  recent illness    Pain Relieving Factors medication, sitting up straight                    [facility] Adult PT Treatment/Exercise - 08/10/21 1239       Transfers   Transfers Sit to Stand;Stand to Sit    Sit to Stand 5: Supervision;With upper extremity assist;From bed;From chair/3-in-1    Stand to Sit 5: Supervision;With upper extremity assist;To bed;To chair/3-in-1      Ambulation/Gait   Ambulation/Gait Yes    Ambulation/Gait Assistance 4: Min guard    Ambulation/Gait Assistance Details cues for upright posture and increased bil step length.    Ambulation Distance (Feet) 40 Feet   x1, 120 x1, plus around clinic with session   Assistive device Rolling walker;Prosthesis    Gait Pattern Step-through pattern;Decreased stride length;Decreased hip/knee flexion - right;Decreased hip/knee flexion - left;Trunk flexed;Narrow base of support    Ambulation Surface Level;Indoor      High Level Balance   High Level Balance Activities Side stepping;Marching forwards;Marching backwards    High Level Balance Comments in parallel bars: with bil UE support  with cues on posture and ex form/technique, 3-4 laps each with min guard assist for safety/balance.      Knee/Hip Exercises: Aerobic   Other Aerobic BP 92/66, HR 87 before. Scifit UE/LE's level 4.0 x 8 minutes with goal >/= 60 steps per minute for strengthening and activity tolerance. BP 104/71   HR 90 afterwards.                PT Short Term Goals - 07/18/21 1300       PT SHORT TERM GOAL #1   Title Pt will demo 16/56 on BBS to decrease fall risk and improve walking endurance    Baseline 13/56 (06/22/21); 14/56 (07/18/21)    Time 4    Period Weeks    Status On-going    Target Date 07/20/21      PT SHORT TERM GOAL #2   Title Pt will demo <38 sec with TUG wit RW to improve functional mobility    Baseline 43 sec with RW  (06/22/21); 36 sec with RW (07/18/21)    Time 4    Period Weeks    Status Achieved    Target Date 07/20/21               PT Long Term Goals - 06/22/21 1029       PT LONG TERM GOAL #1   Title Pt will demo at least 0.15 m/s improvement in his gait speed with RW to improve functional mobility    Baseline 0.16ms with RW (06/22/21)    Time 8    Period Weeks    Status New    Target Date 08/17/21      PT LONG TERM GOAL #2   Title Pt will demo <35 sec with TUG with RW to improve functional mobility and decrease fall risk    Baseline 43 sec with RW (06/22/21)    Time 8    Period Weeks    Status New    Target Date 08/17/21      PT LONG TERM GOAL #3   Title Pt will demo >19/56 on BBS to improve functional balance and reduce fall risk    Baseline 13/56 (06/22/21)    Time 8    Period Weeks    Status New    Target Date 08/17/21                   Plan - 08/10/21 1239     Clinical Impression Statement Today's skilled session continued to focus on strengthening, gait with RW and balance. No significant issues reported or noted in session. Pt does continue to fatique with activity. The pt is making steady progress toward goals and should benefit from continued PT to progress toward unmet goals.    Personal Factors and Comorbidities Age;Comorbidity 3+    Comorbidities ESRD on HD TTS, CAD, PAD, chronic systolic heart failure, COPD, insulin-dependent diabetes, history of right BKA    Examination-Activity Limitations Bathing;Bed Mobility;Carry;Dressing;Hygiene/Grooming;Lift;Self Feeding;Sleep;Squat;Stairs;Stand;Toileting;Transfers    Examination-Participation Restrictions Cleaning;Community Activity;Laundry;Medication Management;Meal Prep;Occupation;Shop;Church    Stability/Clinical Decision Making Stable/Uncomplicated    Rehab Potential Good    PT Frequency 2x / week    PT Duration 8 weeks    PT Treatment/Interventions ADLs/Self Care Home Management;Cryotherapy;Moist Heat;Gait  training;Stair training;Functional mobility training;Therapeutic activities;Therapeutic exercise;Balance training;Neuromuscular re-education;Patient/family education;Orthotic Fit/Training;Prosthetic Training;Manual techniques;Scar mobilization;Passive range of motion;Energy conservation;Joint Manipulations    PT Next Visit Plan LTGs due 08/17/21. continue gait training focused on increased ambulation distance. Static Standing Balance, Weight Shift Activities  PT Home Exercise Plan Access Code: [email or web address]    Consulted and Agree with Plan of Care Patient             Patient will benefit from skilled therapeutic intervention in order to improve the following deficits and impairments:  Abnormal gait, Decreased activity tolerance, Decreased balance, Decreased coordination, Decreased endurance, Decreased mobility, Decreased range of motion, Decreased safety awareness, Difficulty walking, Decreased strength, Increased fascial restricitons, Impaired flexibility, Improper body mechanics, Postural dysfunction, Prosthetic Dependency  Visit Diagnosis: Unsteadiness on feet  Muscle weakness (generalized)  Other abnormalities of gait and mobility     Problem List Patient Active Problem List   Diagnosis Date Noted   Diabetes mellitus type 2, uncontrolled, with complications ([place]) 0000000   Diabetic nephropathy ([place]) 0000000   Chronic systolic CHF (congestive heart failure) ([place]) 04/16/2021   Hypoglycemia 04/15/2021   Hyperglycemia 04/15/2021   DKA (diabetic ketoacidosis) ([place]) 01/24/2021   COVID-19 virus infection 01/24/2021   Syncope 01/24/2021   ESRD (end stage renal disease) ([place]) 01/24/2021   Congestive heart failure (CHF) ([place]) 01/24/2021   Hypotension 01/24/2021   Shock ([place]) 01/24/2021   Willow Ora, PTA, [facility] [address] [place], [place] [address] [phone] 08/10/21, 3:49 PM   Name: Jose Robinson MRN: [id number] Date of Birth:  [birth year]

## 2021-08-15 ENCOUNTER — Ambulatory Visit: Payer: Medicare (Managed Care) | Admitting: Physical Therapy

## 2021-08-15 ENCOUNTER — Ambulatory Visit: Payer: Medicare (Managed Care) | Admitting: Occupational Therapy

## 2021-08-17 ENCOUNTER — Ambulatory Visit: Payer: Medicare (Managed Care) | Admitting: Physical Therapy

## 2021-08-17 ENCOUNTER — Ambulatory Visit: Payer: Medicare (Managed Care) | Admitting: Occupational Therapy

## 2021-08-22 ENCOUNTER — Encounter: Payer: Medicare (Managed Care) | Admitting: Occupational Therapy

## 2021-08-22 ENCOUNTER — Ambulatory Visit: Payer: Medicare (Managed Care) | Admitting: Physical Therapy

## 2021-08-24 ENCOUNTER — Ambulatory Visit: Payer: Medicare (Managed Care)

## 2021-08-24 ENCOUNTER — Encounter: Payer: Medicare (Managed Care) | Admitting: Occupational Therapy

## 2021-08-24 ENCOUNTER — Ambulatory Visit: Payer: Medicare (Managed Care) | Admitting: Occupational Therapy

## 2021-08-24 ENCOUNTER — Other Ambulatory Visit: Payer: Self-pay

## 2021-08-24 ENCOUNTER — Encounter: Payer: Self-pay | Admitting: Occupational Therapy

## 2021-08-24 DIAGNOSIS — M25641 Stiffness of right hand, not elsewhere classified: Secondary | ICD-10-CM

## 2021-08-24 DIAGNOSIS — R2689 Other abnormalities of gait and mobility: Secondary | ICD-10-CM

## 2021-08-24 DIAGNOSIS — R2681 Unsteadiness on feet: Secondary | ICD-10-CM | POA: Diagnosis not present

## 2021-08-24 DIAGNOSIS — M25621 Stiffness of right elbow, not elsewhere classified: Secondary | ICD-10-CM

## 2021-08-24 DIAGNOSIS — M25642 Stiffness of left hand, not elsewhere classified: Secondary | ICD-10-CM

## 2021-08-24 DIAGNOSIS — M25622 Stiffness of left elbow, not elsewhere classified: Secondary | ICD-10-CM

## 2021-08-24 DIAGNOSIS — M6281 Muscle weakness (generalized): Secondary | ICD-10-CM

## 2021-08-24 DIAGNOSIS — Z89511 Acquired absence of right leg below knee: Secondary | ICD-10-CM

## 2021-08-24 NOTE — Therapy (Signed)
[facility] [address] [place], [place], [address] Phone: [phone]   Fax:  [phone]  Physical Therapy Treatment  Patient Details  Name: Jose Robinson MRN: [id number] Date of Birth: 04-02-1956 Referring Provider (PT): Buzzy Han, MD   Encounter Date: 08/24/2021    08/24/21 1236  PT Visits / Re-Eval  Visit Number 9  Number of Visits 25  Date for PT Re-Evaluation 10/21/21  Authorization  Authorization Type [employer] Medicare/Medicaid (follow Medicare guidelines); Recert [id number]  Progress Note Due on Visit 19  PT Time Calculation  PT Start Time 1230  PT Stop Time 1315  PT Time Calculation (min) 45 min  PT - End of Session  Equipment Utilized During Treatment Gait belt  Activity Tolerance Patient tolerated treatment well  Behavior During Therapy WFL for tasks assessed/performed     Past Medical History:  Diagnosis Date   Diabetes mellitus without complication ([place])    ESRD on hemodialysis ([place])    Hypertension     Past Surgical History:  Procedure Laterality Date   BACK SURGERY     BELOW KNEE LEG AMPUTATION      There were no vitals filed for this visit.   Subjective Assessment - 08/24/21 1236     Subjective I am feeling much better. I am doing little bit of walking as much as I can. I still want to work on my walking and balance. I haven't fallen since start of PT.    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 no issues    How long can you stand comfortably? <30 sec with walker    How long can you walk comfortably? within his room    Patient Stated Goals Improve balance    Currently in Pain? No/denies    Pain Onset More than a month ago               Assessment  Medical Diagnosis Deconditioning  Referring Provider (PT) Buzzy Han, MD  Onset Date/Surgical Date 04/06/21  Precautions  Precautions Fall  Precaution Comments R BKA  Required Braces or Orthoses Other Brace/Splint (R BKA)  Restrictions  Weight Bearing Restrictions No  Home Environment  Living Environment Assisted living  [place] - 2 wheels  Prior Function  Level of Independence Needs assistance with ADLs;Needs assistance with homemaking  Cognition  Overall Cognitive Status Within Functional Limits for tasks assessed  Observation/Other Assessments  Skin Integrity WNL on residula leg  Standardized Balance Assessment  Standardized Balance Assessment TUG;Berg Balance Test;10 meter walk test  10 Meter Walk 0.42 m/s with RW  Berg Balance Test  Sit to Stand 3  Standing Unsupported 0  Sitting with Back Unsupported but Feet Supported on Floor or Stool 4  Stand to Sit 3  Transfers 3  Standing Unsupported with Eyes Closed 0  Standing Unsupported with Feet Together 0  From Standing, Reach Forward with Outstretched Arm 0  From Standing Position, Pick up Object from Floor 0  From Standing Position, Turn to Look Behind Over each Shoulder 0  Turn 360 Degrees 0  Standing Unsupported, Alternately Place Feet on Step/Stool 0  Standing Unsupported, One Foot in Front 0  Standing on One Leg 0  Total Score 13  Timed Up and Go Test  Normal TUG (seconds) 43 (With RW)     08/24/21 1239  Standardized Balance  Assessment  10 Meter Walk 0.68ms  Berg Balance Test  Sit to Stand 3  Standing Unsupported 1  Sitting with Back Unsupported but Feet Supported on Floor or Stool 4  Stand to Sit 3  Transfers 3  Standing Unsupported with Eyes Closed 0  Standing Unsupported with Feet Together 0  From Standing, Reach Forward with Outstretched Arm 0  From Standing Position, Pick up Object from Floor 0  From Standing Position, Turn to Look Behind Over each Shoulder 0  Turn 360 Degrees 0  Standing Unsupported, Alternately Place Feet on Step/Stool 0   Standing Unsupported, One Foot in Front 0  Standing on One Leg 0  Total Score 14  Berg comment: 14/56       Marching with hip flexion hold for 10 sec: 10x R and L Standing unassisted next to countertop: for 15-20 sec, 3x Reviewed above exercises as HEP.                08/24/21 1400  PT SHORT TERM GOAL #1  Title Pt will demo 16/56 on BBS to decrease fall risk and improve walking endurance  Baseline 13/56 (06/22/21); 14/56 (07/18/21)  Time 4  Period Weeks  Status On-going  Target Date 09/21/21  PT SHORT TERM GOAL #2  Title Pt will demo <38 sec with TUG wit RW to improve functional mobility  Baseline 43 sec with RW (06/22/21); 36 sec with RW (07/18/21)  Time 4  Period Weeks  Status Achieved  Target Date 07/20/21  PT SHORT TERM GOAL #3  Title Patient will be able to stand unassisted for 2 minutes to be able to perform safety with ADLs  Baseline <10 sec (08/26/21)  Time 4  Period Weeks  Status Revised  Target Date 09/21/21       08/24/21 1257  PT LONG TERM GOAL #1  Title Pt will demo at least 0.15 m/s improvement in his gait speed with RW to improve functional mobility  Baseline 0.441m with RW (06/22/21); 0.4435mwith RW (08/24/21)  Time 8  Period Weeks  Status On-going  Target Date 10/19/21  PT LONG TERM GOAL #2  Title Pt will demo <35 sec with TUG with RW to improve functional mobility and decrease fall risk  Baseline 43 sec with RW (06/22/21); 37 sec with RW (08/24/21)  Time 8  Period Weeks  Status On-going  Target Date 10/19/21  PT LONG TERM GOAL #3  Title Pt will demo >19/56 on BBS to improve functional balance and reduce fall risk  Baseline 13/56 (06/22/21)  Time 8  Period Weeks  Status On-going  Target Date 10/19/21          08/24/21 1400  Plan  Clinical Impression Statement Patient has been seen for total of 9 sessions from 06/22/21 to 08/24/21 for 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 Patient is making  slower than expected progress towards his functional goals. Patient continues to demonstrate functional weakness, functionally decreased endurance, decreased balance and increased fall risk. Patient will benefit from continued skilled PT to addres his functional deficits.  Personal Factors and Comorbidities Age;Comorbidity 3+  Comorbidities ESRD on HD TTS, CAD, PAD, chronic systolic heart failure, COPD, insulin-dependent diabetes, history of right BKA  Examination-Activity Limitations Bathing;Bed Mobility;Carry;Dressing;Hygiene/Grooming;Lift;Self Feeding;Sleep;Squat;Stairs;Stand;Toileting;Transfers  Examination-Participation Restrictions Cleaning;Community Activity;Laundry;Medication Management;Meal Prep;Occupation;Shop;Church  Pt will benefit from skilled therapeutic intervention in order to improve on the following deficits Abnormal gait;Decreased activity tolerance;Decreased balance;Decreased coordination;Decreased endurance;Decreased mobility;Decreased range of motion;Decreased safety awareness;Difficulty walking;Decreased strength;Increased fascial restricitons;Impaired  flexibility;Improper body mechanics;Postural dysfunction;Prosthetic Dependency  Stability/Clinical Decision Making Stable/Uncomplicated  Rehab Potential Good  PT Frequency 2x / week  PT Duration 8 weeks  PT Treatment/Interventions ADLs/Self Care Home Management;Cryotherapy;Moist Heat;Gait training;Stair training;Functional mobility training;Therapeutic activities;Therapeutic exercise;Balance training;Neuromuscular re-education;Patient/family education;Orthotic Fit/Training;Prosthetic Training;Manual techniques;Scar mobilization;Passive range of motion;Energy conservation;Joint Manipulations  PT Next Visit Plan LTGs due 08/17/21. continue gait training focused on increased ambulation distance. Static Standing Balance, Weight Shift Activities  PT Home Exercise Plan Access Code: [email or web address]  Consulted and Agree with Plan of Care Patient                Patient will benefit from skilled therapeutic intervention in order to improve the following deficits and impairments:     Visit Diagnosis: Unsteadiness on feet  Muscle weakness (generalized)  Other abnormalities of gait and mobility  Hx of right BKA [facility])     Problem List Patient Active Problem List   Diagnosis Date Noted   Diabetes mellitus type 2, uncontrolled, with complications ([place]) 0000000   Diabetic nephropathy ([place]) 0000000   Chronic systolic CHF (congestive heart failure) ([place]) 04/16/2021   Hypoglycemia 04/15/2021   Hyperglycemia 04/15/2021   DKA (diabetic ketoacidosis) ([place]) 01/24/2021   COVID-19 virus infection 01/24/2021   Syncope 01/24/2021   ESRD (end stage renal disease) ([place]) 01/24/2021   Congestive heart failure (CHF) ([place]) 01/24/2021   Hypotension 01/24/2021   Shock ([place]) 01/24/2021    Kerrie Pleasure, PT 08/24/2021, 12:39 PM  [facility] [address] [place], [place], [address] Phone: [phone]   Fax:  [phone]  Name: Jose Robinson MRN: [id number] Date of Birth: 1956/06/16

## 2021-08-24 NOTE — Therapy (Signed)
[facility] [address] [place], [place], [address] Phone: [phone]   Fax:  [phone]  Occupational Therapy Treatment  Patient Details  Name: Jose Robinson MRN: [id number] Date of Birth: [birth year] Referring Provider (OT): Tobias Alexander   Encounter Date: 08/24/2021   OT End of Session - 08/24/21 1321     Visit Number 14    Number of Visits 30   16 visits added at renewal   Date for OT Re-Evaluation 11/02/21   10 weeks + at renewal for 16 visits and to accomodate any scheduling conflicts   Authorization Type [facility] Medicare/ Medicaid    OT Start Time 1319    OT Stop Time 1345   had to get scheduled before ride came   OT Time Calculation (min) 26 min    Activity Tolerance Patient tolerated treatment well    Behavior During Therapy WFL for tasks assessed/performed             Past Medical History:  Diagnosis Date   Diabetes mellitus without complication ([place])    ESRD on hemodialysis ([place])    Hypertension     Past Surgical History:  Procedure Laterality Date   BACK SURGERY     BELOW KNEE LEG AMPUTATION      There were no vitals filed for this visit.   Subjective Assessment - 08/24/21 1320     Subjective  Feeling better - [place] for the [place] 3 days.    Pertinent History Rt BKA, DM, neuropathy    Currently in Pain? No/denies    Pain Score 0-No pain    Pain Onset More than a month ago                          OT Treatments/Exercises (OP) - 08/24/21 0001       Hand Exercises   Other Hand Exercises hand gripper with level 1 with black spring with mod/max difficulty with LUE and with min difficulty with RUE. Pt with increased grip strength but continues to present with deficits impeding overall independence with LB dressing and other ADLS and IADLS.                      OT Short Term Goals - 07/18/21 1150       OT SHORT TERM GOAL #1   Title  Patient will complete an HEP designed to improve range of motion in bilateral hands, forearms, and elbows    Baseline No HEP    Time 4    Period Weeks    Status Achieved      OT SHORT TERM GOAL #2   Title Patient will complete an HEP designed to build upper body strength    Baseline No HEP    Time 4    Period Weeks    Status Achieved      OT SHORT TERM GOAL #3   Title Patient will don pull over shirt with set up assistance    Baseline per patient mod assist at times    Time 4    Period Weeks    Status Achieved      OT SHORT TERM GOAL #4   Title Patient will verbalize understanding of process to don prosthesis for RLE    Baseline Dependent    Time 4    Period Weeks    Status Achieved      OT SHORT  TERM GOAL #5   Title Patient will demonstrate ability to cut food on plate with set up assistance    Time 4    Period Weeks    Status Achieved               OT Long Term Goals - 08/24/21 1322       OT LONG TERM GOAL #1   Title Patient will complete updated HEP for BUE rane, strength and functional use.    Baseline No HEP    Period Weeks    Status On-going      OT LONG TERM GOAL #2   Title Patient will don elastic waist shorts or pants with min assist    Baseline dependent    Time 8    Period Weeks    Status On-going   someone has to assist with pulling shorts up at very end     OT LONG TERM GOAL #3   Title Patient will demonstrate 5 lb increase in grip strength in BUE to aide in helping to don/doff prosthesis    Baseline less than 10 lbs on eval  7/20 - 12lb right 10 lbs left  8/26: 15.4 R, 11.0 L    Time 8    Period Weeks    Status On-going   15.4 R, 11.0 L on 08/24/21     OT LONG TERM GOAL #4   Title Patient will toilet himself with no more than min assist    Baseline total assistance    Time 8    Period Weeks    Status On-going   assistance only for pulling shorts up and hygiene. 08/24/21     OT LONG TERM GOAL #5   Title Patient will demonstrate 90%  composite flexion in BUE to aide in functional grasp    Baseline 50% left, 75% right    Time 8    Period Weeks    Status On-going   approx 80% 08/24/21                  Plan - 08/24/21 1328     Clinical Impression Statement Renewal completed today for period 06/06/21 to 08/24/21. Pt has met all STGs and is progressing towards meeting LTGs. Pt has progressed with increasing independence with ADLs and strength and coordination in BUE but has had a slight decline with current episode of COVID. Skilled occupational therapy is recommended to target remaining goals and increase independence.    OT Occupational Profile and History Detailed Assessment- Review of Records and additional review of physical, cognitive, psychosocial history related to current functional performance    Occupational performance deficits (Please refer to evaluation for details): ADL's    Body Structure / Function / Physical Skills ADL;Dexterity;Flexibility;ROM;Strength;IADL;FMC;Edema;Coordination;Balance;Body mechanics;Decreased knowledge of precautions;Endurance;Pain;Sensation;UE functional use;Skin integrity;Mobility;GMC;Fascial restriction;Decreased knowledge of use of DME    Rehab Potential Fair    Clinical Decision Making Several treatment options, min-mod task modification necessary    Comorbidities Affecting Occupational Performance: May have comorbidities impacting occupational performance    Modification or Assistance to Complete Evaluation  Min-Moderate modification of tasks or assist with assess necessary to complete eval    OT Frequency 2x / week    OT Duration Other (comment)   Renewal: 16 visits over 10 weeks to accomodate for any scheduling conflicts   OT Treatment/Interventions Self-care/ADL training;Therapeutic exercise;DME and/or AE instruction;Functional Mobility Training;Balance training;Fluidtherapy;Neuromuscular education;Manual Therapy;Scar mobilization;Splinting;Patient/family education;Therapeutic  activities;Passive range of motion    Plan work on hand strength (to  use sock aide and for functional tasks), standing balance with prosthetic    Consulted and Agree with Plan of Care Patient             Patient will benefit from skilled therapeutic intervention in order to improve the following deficits and impairments:   Body Structure / Function / Physical Skills: ADL, Dexterity, Flexibility, ROM, Strength, IADL, FMC, Edema, Coordination, Balance, Body mechanics, Decreased knowledge of precautions, Endurance, Pain, Sensation, UE functional use, Skin integrity, Mobility, GMC, Fascial restriction, Decreased knowledge of use of DME       Visit Diagnosis: Stiffness of right elbow, not elsewhere classified  Muscle weakness (generalized)  Other abnormalities of gait and mobility  Stiffness of left elbow, not elsewhere classified  Stiffness of right hand, not elsewhere classified  Stiffness of left hand, not elsewhere classified    Problem List Patient Active Problem List   Diagnosis Date Noted   Diabetes mellitus type 2, uncontrolled, with complications ([place]) 79/02/8332   Diabetic nephropathy ([place]) 83/29/1916   Chronic systolic CHF (congestive heart failure) ([place]) 04/16/2021   Hypoglycemia 04/15/2021   Hyperglycemia 04/15/2021   DKA (diabetic ketoacidosis) ([place]) 01/24/2021   COVID-19 virus infection 01/24/2021   Syncope 01/24/2021   ESRD (end stage renal disease) ([place]) 01/24/2021   Congestive heart failure (CHF) ([place]) 01/24/2021   Hypotension 01/24/2021   Shock ([place]) 01/24/2021    Zachery Conch MOT, OTR/L  08/24/2021, 1:52 PM  [facility] [address] [place], [place], [address] Phone: [phone]   Fax:  [phone]  Name: Jose Robinson MRN: [id number] Date of Birth: 29-Jan-1956

## 2021-09-10 ENCOUNTER — Ambulatory Visit: Payer: Medicare (Managed Care)

## 2021-09-10 ENCOUNTER — Ambulatory Visit: Payer: Medicare (Managed Care) | Admitting: Occupational Therapy

## 2021-09-12 ENCOUNTER — Ambulatory Visit: Payer: Medicare (Managed Care) | Attending: Family Medicine

## 2021-09-12 ENCOUNTER — Ambulatory Visit: Payer: Medicare (Managed Care) | Admitting: Occupational Therapy

## 2021-09-12 ENCOUNTER — Other Ambulatory Visit: Payer: Self-pay

## 2021-09-12 VITALS — BP 118/80

## 2021-09-12 DIAGNOSIS — M6281 Muscle weakness (generalized): Secondary | ICD-10-CM | POA: Insufficient documentation

## 2021-09-12 DIAGNOSIS — M25641 Stiffness of right hand, not elsewhere classified: Secondary | ICD-10-CM | POA: Diagnosis present

## 2021-09-12 DIAGNOSIS — R2681 Unsteadiness on feet: Secondary | ICD-10-CM | POA: Diagnosis present

## 2021-09-12 DIAGNOSIS — M25622 Stiffness of left elbow, not elsewhere classified: Secondary | ICD-10-CM | POA: Diagnosis present

## 2021-09-12 DIAGNOSIS — M25642 Stiffness of left hand, not elsewhere classified: Secondary | ICD-10-CM | POA: Diagnosis present

## 2021-09-12 DIAGNOSIS — Z89511 Acquired absence of right leg below knee: Secondary | ICD-10-CM | POA: Insufficient documentation

## 2021-09-12 DIAGNOSIS — R2689 Other abnormalities of gait and mobility: Secondary | ICD-10-CM | POA: Insufficient documentation

## 2021-09-12 DIAGNOSIS — M25621 Stiffness of right elbow, not elsewhere classified: Secondary | ICD-10-CM | POA: Insufficient documentation

## 2021-09-12 NOTE — Therapy (Signed)
[facility] [address] [place], [place], [address] Phone: [phone]   Fax:  [phone]  Physical Therapy Treatment  Patient Details  Name: Jose Robinson MRN: [id number] Date of Birth: [birth year] Referring Provider (PT): Buzzy Han, MD   Encounter Date: 09/12/2021   PT End of Session - 09/12/21 1536     Visit Number 10    Number of Visits 25    Date for PT Re-Evaluation 10/21/21    Authorization Type [employer] Medicare/Medicaid (follow Medicare guidelines); Recert [id number] to [id number]    Progress Note Due on Visit 19    PT Start Time 1532    PT Stop Time 1610    PT Time Calculation (min) 38 min    Equipment Utilized During Treatment Gait belt    Activity Tolerance Patient tolerated treatment well    Behavior During Therapy WFL for tasks assessed/performed             Past Medical History:  Diagnosis Date   Diabetes mellitus without complication ([place])    ESRD on hemodialysis ([place])    Hypertension     Past Surgical History:  Procedure Laterality Date   BACK SURGERY     BELOW KNEE LEG AMPUTATION      Vitals:   09/12/21 1541  BP: 118/80     Subjective Assessment - 09/12/21 1533     Subjective Pt reports that he is having trouble with cushion on chair not holding air. He reports that he is feeling kind of stiff as has not been able to do much since he last left here because there was obstacles in his normal path.    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 no issues    How long can you stand comfortably? <30 sec with walker    How long can you walk comfortably? within his room    Patient Stated Goals Improve balance    Currently in Pain? Yes    Pain Location Generalized    Pain Descriptors / Indicators Aching    Pain Type Chronic pain    Pain Onset More  than a month ago    Pain Frequency Constant                               [facility] Adult PT Treatment/Exercise - 09/12/21 1537       Transfers   Transfers Sit to Stand;Stand to [employer] Transfers    Sit to Stand 5: Supervision;With upper extremity assist    Stand to Sit 5: Supervision    Stand Pivot Transfers 5: Supervision    Stand Pivot Transfer Details (indicate cue type and reason) with walker      Ambulation/Gait   Ambulation/Gait Yes    Ambulation/Gait Assistance 4: Min guard    Ambulation/Gait Assistance Details Pt was cued to stay up in walker and try to stand up tall.    Ambulation Distance (Feet) 115 Feet   x 2.   Assistive device Rolling walker;Prosthesis    Gait Pattern Step-through pattern;Decreased step length - right;Decreased step length - left;Trunk flexed    Ambulation Surface Level;Indoor    Gait Comments Pt performed standing at walker working on upright posture trying to let go briefly x 3 about 5-10 sec each CGA and tactile cues at truck. Pt tends  to lose balance posterior. Instructed to keep weight more towards toes. Also noted bend at left knee with pelvis lower on right. Trialed 3m and 127mlift under prosthestic foot. Pelvis leveled off with less left knee flexion on 1131mift. Pt's prosthesis does seem to be short. He reports he is due to a new one and needs order from MD to pursue. Also discussed changing shoes can have affect on his walking if heel height is different.      Prosthetics   Prosthetic Care Comments  Pt wearing 5 ply sock    Current prosthetic wear tolerance (days/week)  daily    Current prosthetic wear tolerance (#hours/day)  all awake hours                       PT Short Term Goals - 08/24/21 1400       PT SHORT TERM GOAL #1   Title Pt will demo 16/56 on BBS to decrease fall risk and improve walking endurance    Baseline 13/56 (06/22/21); 14/56 (07/18/21)    Time 4    Period Weeks    Status  On-going    Target Date 09/21/21      PT SHORT TERM GOAL #2   Title Pt will demo <38 sec with TUG wit RW to improve functional mobility    Baseline 43 sec with RW (06/22/21); 36 sec with RW (07/18/21)    Time 4    Period Weeks    Status Achieved    Target Date 07/20/21      PT SHORT TERM GOAL #3   Title Patient will be able to stand unassisted for 2 minutes to be able to perform safety with ADLs    Baseline <10 sec (08/26/21)    Time 4    Period Weeks    Status Revised    Target Date 09/21/21               PT Long Term Goals - 08/24/21 1257       PT LONG TERM GOAL #1   Title Pt will demo at least 0.15 m/s improvement in his gait speed with RW to improve functional mobility    Baseline 0.63m72mith RW (06/22/21); 0.70m/45mth RW (08/24/21)    Time 8    Period Weeks    Status On-going    Target Date 10/19/21      PT LONG TERM GOAL #2   Title Pt will demo <35 sec with TUG with RW to improve functional mobility and decrease fall risk    Baseline 43 sec with RW (06/22/21); 37 sec with RW (08/24/21)    Time 8    Period Weeks    Status On-going    Target Date 10/19/21      PT LONG TERM GOAL #3   Title Pt will demo >19/56 on BBS to improve functional balance and reduce fall risk    Baseline 13/56 (06/22/21)    Time 8    Period Weeks    Status On-going    Target Date 10/19/21                   Plan - 09/12/21 2106     Clinical Impression Statement Pt able to increase gait bouts and distance with RW. He does have flexed posture. Tried to work more on upright posture and decreasing UE support but pt loses balance posterior. Also noted that prosthesis seems short.    Personal  Factors and Comorbidities Age;Comorbidity 3+    Comorbidities ESRD on HD TTS, CAD, PAD, chronic systolic heart failure, COPD, insulin-dependent diabetes, history of right BKA    Examination-Activity Limitations Bathing;Bed Mobility;Carry;Dressing;Hygiene/Grooming;Lift;Self  Feeding;Sleep;Squat;Stairs;Stand;Toileting;Transfers    Examination-Participation Restrictions Cleaning;Community Activity;Laundry;Medication Management;Meal Prep;Occupation;Shop;Church    Stability/Clinical Decision Making Stable/Uncomplicated    Rehab Potential Good    PT Frequency 2x / week    PT Duration 8 weeks    PT Treatment/Interventions ADLs/Self Care Home Management;Cryotherapy;Moist Heat;Gait training;Stair training;Functional mobility training;Therapeutic activities;Therapeutic exercise;Balance training;Neuromuscular re-education;Patient/family education;Orthotic Fit/Training;Prosthetic Training;Manual techniques;Scar mobilization;Passive range of motion;Energy conservation;Joint Manipulations    PT Next Visit Plan continue gait training focused on increased ambulation distance. Static Standing Balance, Weight Shift Activities, improving upright posture. Has process been started to get new prosthesis?current one also too short.    PT Home Exercise Plan Access Code: [email or web address]    Consulted and Agree with Plan of Care Patient             Patient will benefit from skilled therapeutic intervention in order to improve the following deficits and impairments:  Abnormal gait, Decreased activity tolerance, Decreased balance, Decreased coordination, Decreased endurance, Decreased mobility, Decreased range of motion, Decreased safety awareness, Difficulty walking, Decreased strength, Increased fascial restricitons, Impaired flexibility, Improper body mechanics, Postural dysfunction, Prosthetic Dependency  Visit Diagnosis: Other abnormalities of gait and mobility  Muscle weakness (generalized)  Unsteadiness on feet     Problem List Patient Active Problem List   Diagnosis Date Noted   Diabetes mellitus type 2, uncontrolled, with complications ([place]) 0000000   Diabetic nephropathy ([place]) 0000000   Chronic systolic CHF (congestive heart failure) ([place]) 04/16/2021   Hypoglycemia  04/15/2021   Hyperglycemia 04/15/2021   DKA (diabetic ketoacidosis) ([place]) 01/24/2021   COVID-19 virus infection 01/24/2021   Syncope 01/24/2021   ESRD (end stage renal disease) ([place]) 01/24/2021   Congestive heart failure (CHF) ([place]) 01/24/2021   Hypotension 01/24/2021   Shock ([place]) 01/24/2021    Electa Sniff, PT, DPT, NCS 09/12/2021, 9:10 PM  [facility] [address] [place], [place], [address] Phone: [phone]   Fax:  [phone]  Name: Jose Robinson MRN: [id number] Date of Birth: [birth year]

## 2021-09-19 ENCOUNTER — Other Ambulatory Visit: Payer: Self-pay

## 2021-09-19 ENCOUNTER — Encounter: Payer: Self-pay | Admitting: Occupational Therapy

## 2021-09-19 ENCOUNTER — Ambulatory Visit: Payer: Medicare (Managed Care)

## 2021-09-19 ENCOUNTER — Ambulatory Visit: Payer: Medicare (Managed Care) | Admitting: Occupational Therapy

## 2021-09-19 DIAGNOSIS — M6281 Muscle weakness (generalized): Secondary | ICD-10-CM

## 2021-09-19 DIAGNOSIS — M25621 Stiffness of right elbow, not elsewhere classified: Secondary | ICD-10-CM

## 2021-09-19 DIAGNOSIS — Z89511 Acquired absence of right leg below knee: Secondary | ICD-10-CM

## 2021-09-19 DIAGNOSIS — M25642 Stiffness of left hand, not elsewhere classified: Secondary | ICD-10-CM

## 2021-09-19 DIAGNOSIS — R2689 Other abnormalities of gait and mobility: Secondary | ICD-10-CM

## 2021-09-19 DIAGNOSIS — R2681 Unsteadiness on feet: Secondary | ICD-10-CM

## 2021-09-19 DIAGNOSIS — M25641 Stiffness of right hand, not elsewhere classified: Secondary | ICD-10-CM

## 2021-09-19 DIAGNOSIS — M25622 Stiffness of left elbow, not elsewhere classified: Secondary | ICD-10-CM

## 2021-09-19 NOTE — Therapy (Signed)
[facility] [address] [place], [place], [address] Phone: [phone]   Fax:  [phone]  Occupational Therapy Treatment  Patient Details  Name: Jose Robinson MRN: [id number] Date of Birth: 04-26-1956 Referring Provider (OT): Tobias Alexander   Encounter Date: 09/19/2021   OT End of Session - 09/19/21 1448     Visit Number 15    Number of Visits 30   16 visits added at renewal   Date for OT Re-Evaluation 11/02/21   10 weeks + at renewal for 16 visits and to accomodate any scheduling conflicts   Authorization Type [facility] Medicare/ Medicaid    OT Start Time 1448    OT Stop Time 1530    OT Time Calculation (min) 42 min    Activity Tolerance Patient tolerated treatment well    Behavior During Therapy WFL for tasks assessed/performed             Past Medical History:  Diagnosis Date   Diabetes mellitus without complication ([place])    ESRD on hemodialysis ([place])    Hypertension     Past Surgical History:  Procedure Laterality Date   BACK SURGERY     BELOW KNEE LEG AMPUTATION      There were no vitals filed for this visit.     Supine Dowel Exercises see pt instructions. MedBridge Access Code: [email or web address]   Standing Balance x 2 with reaching across body and out of midline x 2 with no LOB with unilateral support. Stood without UE support x 2 with LOB x 2 anteriorly and posteriorly. Pt able to maintain standing balance without UE support with SBA x 10 seconds.                  OT Education - 09/19/21 1505     Education Details Access Code: [email or web address] supine dowel exercises AAROM    Person(s) Educated Patient    Methods Explanation;Demonstration;Handout    Comprehension Verbalized understanding;Returned demonstration              OT Short Term Goals - 07/18/21 1150       OT SHORT TERM GOAL #1   Title Patient will complete an HEP designed to improve range of motion in bilateral  hands, forearms, and elbows    Baseline No HEP    Time 4    Period Weeks    Status Achieved      OT SHORT TERM GOAL #2   Title Patient will complete an HEP designed to build upper body strength    Baseline No HEP    Time 4    Period Weeks    Status Achieved      OT SHORT TERM GOAL #3   Title Patient will don pull over shirt with set up assistance    Baseline per patient mod assist at times    Time 4    Period Weeks    Status Achieved      OT SHORT TERM GOAL #4   Title Patient will verbalize understanding of process to don prosthesis for RLE    Baseline Dependent    Time 4    Period Weeks    Status Achieved      OT SHORT TERM GOAL #5   Title Patient will demonstrate ability to cut food on plate with set up assistance    Time 4    Period Weeks    Status Achieved  OT Long Term Goals - 08/24/21 1322       OT LONG TERM GOAL #1   Title Patient will complete updated HEP for BUE rane, strength and functional use.    Baseline No HEP    Period Weeks    Status On-going      OT LONG TERM GOAL #2   Title Patient will don elastic waist shorts or pants with min assist    Baseline dependent    Time 8    Period Weeks    Status On-going   someone has to assist with pulling shorts up at very end     OT LONG TERM GOAL #3   Title Patient will demonstrate 5 lb increase in grip strength in BUE to aide in helping to don/doff prosthesis    Baseline less than 10 lbs on eval  7/20 - 12lb right 10 lbs left  8/26: 15.4 R, 11.0 L    Time 8    Period Weeks    Status On-going   15.4 R, 11.0 L on 08/24/21     OT LONG TERM GOAL #4   Title Patient will toilet himself with no more than min assist    Baseline total assistance    Time 8    Period Weeks    Status On-going   assistance only for pulling shorts up and hygiene. 08/24/21     OT LONG TERM GOAL #5   Title Patient will demonstrate 90% composite flexion in BUE to aide in functional grasp    Baseline 50% left, 75%  right    Time 8    Period Weeks    Status On-going   approx 80% 08/24/21                  Plan - 09/19/21 1626     Clinical Impression Statement Pt progressing towards goals. Pt with continued deficits with BUE strength and ROM impeding overall independence with ADLs and IADLs.    OT Occupational Profile and History Detailed Assessment- Review of Records and additional review of physical, cognitive, psychosocial history related to current functional performance    Occupational performance deficits (Please refer to evaluation for details): ADL's    Body Structure / Function / Physical Skills ADL;Dexterity;Flexibility;ROM;Strength;IADL;FMC;Edema;Coordination;Balance;Body mechanics;Decreased knowledge of precautions;Endurance;Pain;Sensation;UE functional use;Skin integrity;Mobility;GMC;Fascial restriction;Decreased knowledge of use of DME    Rehab Potential Fair    Clinical Decision Making Several treatment options, min-mod task modification necessary    Comorbidities Affecting Occupational Performance: May have comorbidities impacting occupational performance    Modification or Assistance to Complete Evaluation  Min-Moderate modification of tasks or assist with assess necessary to complete eval    OT Frequency 2x / week    OT Duration Other (comment)   Renewal: 16 visits over 10 weeks to accomodate for any scheduling conflicts   OT Treatment/Interventions Self-care/ADL training;Therapeutic exercise;DME and/or AE instruction;Functional Mobility Training;Balance training;Fluidtherapy;Neuromuscular education;Manual Therapy;Scar mobilization;Splinting;Patient/family education;Therapeutic activities;Passive range of motion    Plan work on hand strength (to use sock aide and for functional tasks), standing balance with prosthetic    Consulted and Agree with Plan of Care Patient             Patient will benefit from skilled therapeutic intervention in order to improve the following  deficits and impairments:   Body Structure / Function / Physical Skills: ADL, Dexterity, Flexibility, ROM, Strength, IADL, FMC, Edema, Coordination, Balance, Body mechanics, Decreased knowledge of precautions, Endurance, Pain, Sensation, UE functional use, Skin integrity, Mobility,  [place], Fascial restriction, Decreased knowledge of use of DME       Visit Diagnosis: Stiffness of left hand, not elsewhere classified  Stiffness of right elbow, not elsewhere classified  Stiffness of right hand, not elsewhere classified  Stiffness of left elbow, not elsewhere classified  Unsteadiness on feet  Muscle weakness (generalized)  Other abnormalities of gait and mobility    Problem List Patient Active Problem List   Diagnosis Date Noted   Diabetes mellitus type 2, uncontrolled, with complications ([place]) 0000000   Diabetic nephropathy ([place]) 0000000   Chronic systolic CHF (congestive heart failure) ([place]) 04/16/2021   Hypoglycemia 04/15/2021   Hyperglycemia 04/15/2021   DKA (diabetic ketoacidosis) ([place]) 01/24/2021   COVID-19 virus infection 01/24/2021   Syncope 01/24/2021   ESRD (end stage renal disease) ([place]) 01/24/2021   Congestive heart failure (CHF) ([place]) 01/24/2021   Hypotension 01/24/2021   Shock ([place]) 01/24/2021    Zachery Conch, OT/L 09/19/2021, 4:27 PM  [facility] [address] [place], [place], [address] Phone: [phone]   Fax:  [phone]  Name: ZACKAREE CALLERY MRN: [id number] Date of Birth: 02/16/1956

## 2021-09-19 NOTE — Therapy (Signed)
[facility] [address] [place], [place], [address] Phone: [phone]   Fax:  [phone]  Physical Therapy Treatment  Patient Details  Name: Jose Robinson MRN: [id number] Date of Birth: [birth year] Referring Provider (PT): Buzzy Han, MD   Encounter Date: 09/19/2021   PT End of Session - 09/19/21 1407     Visit Number 11    Number of Visits 25    Date for PT Re-Evaluation 10/21/21    Authorization Type [employer] Medicare/Medicaid (follow Medicare guidelines); Recert [id number] to [id number]    Progress Note Due on Visit 19    PT Start Time 1400    PT Stop Time 1445    PT Time Calculation (min) 45 min    Equipment Utilized During Treatment Gait belt    Activity Tolerance Patient tolerated treatment well    Behavior During Therapy WFL for tasks assessed/performed             Past Medical History:  Diagnosis Date   Diabetes mellitus without complication ([place])    ESRD on hemodialysis ([place])    Hypertension     Past Surgical History:  Procedure Laterality Date   BACK SURGERY     BELOW KNEE LEG AMPUTATION      There were no vitals filed for this visit.     Sit to stand: 5x with HHA Standing maintaining balance with wide BOS: 5 sec max Standing mintaining balnace with wide BOS; with slideboard under R shoe: 2 x 15" Standing mainitainign balance with wide BOS and slidboard under:  - bil OH reaching: 5x - trunk twists with touching corners of walkers in front of him with contralateral UE: 5x - toe touches: as far as he can: 5x Placed 2 heel lifts inside the R shoe, total height 16 mm Gait training: 1 x 115' with RW with 16 mm heel lift, pt felt his knee was buckling though his knee was not excessively flexed during stance phase. We took out 10 mm heel lift  Gait training with 6 mm heel lift and RW: CGA and 115'                            PT Short Term Goals - 08/24/21  1400       PT SHORT TERM GOAL #1   Title Pt will demo 16/56 on BBS to decrease fall risk and improve walking endurance    Baseline 13/56 (06/22/21); 14/56 (07/18/21)    Time 4    Period Weeks    Status On-going    Target Date 09/21/21      PT SHORT TERM GOAL #2   Title Pt will demo <38 sec with TUG wit RW to improve functional mobility    Baseline 43 sec with RW (06/22/21); 36 sec with RW (07/18/21)    Time 4    Period Weeks    Status Achieved    Target Date 07/20/21      PT SHORT TERM GOAL #3   Title Patient will be able to stand unassisted for 2 minutes to be able to perform safety with ADLs    Baseline <10 sec (08/26/21)    Time 4    Period Weeks    Status Revised    Target Date 09/21/21               PT Long Term Goals - 08/24/21 1257       PT  LONG TERM GOAL #1   Title Pt will demo at least 0.15 m/s improvement in his gait speed with RW to improve functional mobility    Baseline 0.77ms with RW (06/22/21); 0.456m with RW (08/24/21)    Time 8    Period Weeks    Status On-going    Target Date 10/19/21      PT LONG TERM GOAL #2   Title Pt will demo <35 sec with TUG with RW to improve functional mobility and decrease fall risk    Baseline 43 sec with RW (06/22/21); 37 sec with RW (08/24/21)    Time 8    Period Weeks    Status On-going    Target Date 10/19/21      PT LONG TERM GOAL #3   Title Pt will demo >19/56 on BBS to improve functional balance and reduce fall risk    Baseline 13/56 (06/22/21)    Time 8    Period Weeks    Status On-going    Target Date 10/19/21                   Plan - 09/19/21 1408     Clinical Impression Statement With 1/2 inch slideboard under neath R LE, pt able to stand up longer with improve WB on L LE. With slideboard, pt able to maintain standing balance for 15 sec compared to 5 sec without.    Personal Factors and Comorbidities Age;Comorbidity 3+    Comorbidities ESRD on HD TTS, CAD, PAD, chronic systolic heart failure,  COPD, insulin-dependent diabetes, history of right BKA    Examination-Activity Limitations Bathing;Bed Mobility;Carry;Dressing;Hygiene/Grooming;Lift;Self Feeding;Sleep;Squat;Stairs;Stand;Toileting;Transfers    Examination-Participation Restrictions Cleaning;Community Activity;Laundry;Medication Management;Meal Prep;Occupation;Shop;Church    Stability/Clinical Decision Making Stable/Uncomplicated    Rehab Potential Good    PT Frequency 2x / week    PT Duration 8 weeks    PT Treatment/Interventions ADLs/Self Care Home Management;Cryotherapy;Moist Heat;Gait training;Stair training;Functional mobility training;Therapeutic activities;Therapeutic exercise;Balance training;Neuromuscular re-education;Patient/family education;Orthotic Fit/Training;Prosthetic Training;Manual techniques;Scar mobilization;Passive range of motion;Energy conservation;Joint Manipulations    PT Next Visit Plan he has called [facility] and awaiting call back from them to set up an evaluation for revision of his prosthetic, continue gait training focused on increased ambulation distance. Static Standing Balance, Weight Shift Activities, improving upright posture. Has process been started to get new prosthesis?current one also too short.    PT Home Exercise Plan Access Code: [email or web address]  Consulted and Agree with Plan of Care Patient             Patient will benefit from skilled therapeutic intervention in order to improve the following deficits and impairments:  Abnormal gait, Decreased activity tolerance, Decreased balance, Decreased coordination, Decreased endurance, Decreased mobility, Decreased range of motion, Decreased safety awareness, Difficulty walking, Decreased strength, Increased fascial restricitons, Impaired flexibility, Improper body mechanics, Postural dysfunction, Prosthetic Dependency  Visit Diagnosis: Other abnormalities of gait and mobility  Muscle weakness (generalized)  Unsteadiness on feet  [facility]  [facility]    Problem List Patient Active Problem List   Diagnosis Date Noted   Diabetes mellitus type 2, uncontrolled, with complications ([facility]040000000 Diabetic nephropathy ([id number] Chronic systolic CHF (congestive heart failure) ([facility]04/18/2022   Hypoglycemia 04/15/2021   Hyperglycemia 04/15/2021   DKA (diabetic ketoacidosis) ([facility]01/26/2022   COVID-19 virus infection 01/24/2021   Syncope 01/24/2021   ESRD (end stage renal disease) ([facility]CD[facility]01/26/2022   Congestive heart failure (CHF) ([facility]01/26/2022   Hypotension 01/24/2021  Shock ([place]) 01/24/2021    Kerrie Pleasure, PT 09/19/2021, 2:45 PM  [facility] [address] [place], [place], [address] Phone: [phone]   Fax:  [phone]  Name: Jose Robinson MRN: [id number] Date of Birth: May 31, 1956

## 2021-09-19 NOTE — Patient Instructions (Signed)
Access Code: [email or web address] URL: [email or web address] Date: 09/19/2021 Prepared by: Waldo Laine  Exercises Supine Shoulder Flexion Extension AAROM with Dowel - 1 x daily - 7 x weekly - 3 sets - 10 reps Supine Shoulder Abduction AAROM with Dowel - 1 x daily - 7 x weekly - 3 sets - 10 reps Supine Shoulder External Internal Rotation AAROM with Dowel - 1 x daily - 7 x weekly - 3 sets - 10 reps Supine Shoulder Press with Dowel - 1 x daily - 7 x weekly - 3 sets - 10 reps Supine Shoulder Horizontal Abduction Adduction AAROM with Dowel - 1 x daily - 7 x weekly - 3 sets - 10 reps

## 2021-09-21 ENCOUNTER — Ambulatory Visit: Payer: Medicare (Managed Care)

## 2021-09-21 ENCOUNTER — Other Ambulatory Visit: Payer: Self-pay

## 2021-09-21 ENCOUNTER — Ambulatory Visit: Payer: Medicare (Managed Care) | Admitting: Occupational Therapy

## 2021-09-26 ENCOUNTER — Ambulatory Visit: Payer: Medicare (Managed Care) | Admitting: Occupational Therapy

## 2021-09-26 ENCOUNTER — Encounter: Payer: Self-pay | Admitting: Occupational Therapy

## 2021-09-26 ENCOUNTER — Other Ambulatory Visit: Payer: Self-pay

## 2021-09-26 ENCOUNTER — Ambulatory Visit: Payer: Medicare (Managed Care)

## 2021-09-26 DIAGNOSIS — R2689 Other abnormalities of gait and mobility: Secondary | ICD-10-CM | POA: Diagnosis not present

## 2021-09-26 DIAGNOSIS — M25622 Stiffness of left elbow, not elsewhere classified: Secondary | ICD-10-CM

## 2021-09-26 DIAGNOSIS — Z89511 Acquired absence of right leg below knee: Secondary | ICD-10-CM

## 2021-09-26 DIAGNOSIS — M25641 Stiffness of right hand, not elsewhere classified: Secondary | ICD-10-CM

## 2021-09-26 DIAGNOSIS — M25642 Stiffness of left hand, not elsewhere classified: Secondary | ICD-10-CM

## 2021-09-26 DIAGNOSIS — M6281 Muscle weakness (generalized): Secondary | ICD-10-CM

## 2021-09-26 DIAGNOSIS — R2681 Unsteadiness on feet: Secondary | ICD-10-CM

## 2021-09-26 DIAGNOSIS — M25621 Stiffness of right elbow, not elsewhere classified: Secondary | ICD-10-CM

## 2021-09-26 NOTE — Therapy (Signed)
[facility] [address] [place], [place], [address] Phone: [phone]   Fax:  [phone]  Occupational Therapy Treatment  Patient Details  Name: Jose Robinson MRN: [id number] Date of Birth: October 19, 1956 Referring Provider (OT): Tobias Alexander   Encounter Date: 09/26/2021   OT End of Session - 09/26/21 1538     Visit Number 16    Number of Visits 30   16 visits added at renewal   Date for OT Re-Evaluation 11/02/21   10 weeks + at renewal for 16 visits and to accomodate any scheduling conflicts   Authorization Type [facility] Medicare/ Medicaid    OT Start Time 1536    OT Stop Time 1615    OT Time Calculation (min) 39 min    Activity Tolerance Patient tolerated treatment well    Behavior During Therapy WFL for tasks assessed/performed             Past Medical History:  Diagnosis Date   Diabetes mellitus without complication ([place])    ESRD on hemodialysis ([place])    Hypertension     Past Surgical History:  Procedure Laterality Date   BACK SURGERY     BELOW KNEE LEG AMPUTATION      There were no vitals filed for this visit.   Subjective Assessment - 09/26/21 1537     Subjective  "I did some stuff I never done before"- pt reports going to toilet with only assistance for pulling pants up bc they fell to floor.    Pertinent History Rt BKA, DM, neuropathy    Currently in Pain? Yes    Pain Score 5     Pain Location Generalized    Pain Orientation Other (Comment)    Pain Descriptors / Indicators Aching    Pain Type Acute pain    Pain Onset More than a month ago    Pain Frequency Intermittent    Aggravating Factors  after PT, working             Seated/Physioball x 10 reps with min A for extended reach forward.  Resistance Clothespins 1-6# with LUE and RUE (inidividually) Pt with fatigue but no reports of pain during activity.                          OT Short Term Goals -  07/18/21 1150       OT SHORT TERM GOAL #1   Title Patient will complete an HEP designed to improve range of motion in bilateral hands, forearms, and elbows    Baseline No HEP    Time 4    Period Weeks    Status Achieved      OT SHORT TERM GOAL #2   Title Patient will complete an HEP designed to build upper body strength    Baseline No HEP    Time 4    Period Weeks    Status Achieved      OT SHORT TERM GOAL #3   Title Patient will don pull over shirt with set up assistance    Baseline per patient mod assist at times    Time 4    Period Weeks    Status Achieved      OT SHORT TERM GOAL #4   Title Patient will verbalize understanding of process to don prosthesis for RLE    Baseline Dependent    Time 4    Period Weeks    Status  Achieved      OT SHORT TERM GOAL #5   Title Patient will demonstrate ability to cut food on plate with set up assistance    Time 4    Period Weeks    Status Achieved               OT Long Term Goals - 08/24/21 1322       OT LONG TERM GOAL #1   Title Patient will complete updated HEP for BUE rane, strength and functional use.    Baseline No HEP    Period Weeks    Status On-going      OT LONG TERM GOAL #2   Title Patient will don elastic waist shorts or pants with min assist    Baseline dependent    Time 8    Period Weeks    Status On-going   someone has to assist with pulling shorts up at very end     OT LONG TERM GOAL #3   Title Patient will demonstrate 5 lb increase in grip strength in BUE to aide in helping to don/doff prosthesis    Baseline less than 10 lbs on eval  7/20 - 12lb right 10 lbs left  8/26: 15.4 R, 11.0 L    Time 8    Period Weeks    Status On-going   15.4 R, 11.0 L on 08/24/21     OT LONG TERM GOAL #4   Title Patient will toilet himself with no more than min assist    Baseline total assistance    Time 8    Period Weeks    Status On-going   assistance only for pulling shorts up and hygiene. 08/24/21     OT  LONG TERM GOAL #5   Title Patient will demonstrate 90% composite flexion in BUE to aide in functional grasp    Baseline 50% left, 75% right    Time 8    Period Weeks    Status On-going   approx 80% 08/24/21                  Plan - 09/26/21 1606     Clinical Impression Statement Pt with slow and steady progress towards goals.    OT Occupational Profile and History Detailed Assessment- Review of Records and additional review of physical, cognitive, psychosocial history related to current functional performance    Occupational performance deficits (Please refer to evaluation for details): ADL's    Body Structure / Function / Physical Skills ADL;Dexterity;Flexibility;ROM;Strength;IADL;FMC;Edema;Coordination;Balance;Body mechanics;Decreased knowledge of precautions;Endurance;Pain;Sensation;UE functional use;Skin integrity;Mobility;GMC;Fascial restriction;Decreased knowledge of use of DME    Rehab Potential Fair    Clinical Decision Making Several treatment options, min-mod task modification necessary    Comorbidities Affecting Occupational Performance: May have comorbidities impacting occupational performance    Modification or Assistance to Complete Evaluation  Min-Moderate modification of tasks or assist with assess necessary to complete eval    OT Frequency 2x / week    OT Duration Other (comment)   Renewal: 16 visits over 10 weeks to accomodate for any scheduling conflicts   OT Treatment/Interventions Self-care/ADL training;Therapeutic exercise;DME and/or AE instruction;Functional Mobility Training;Balance training;Fluidtherapy;Neuromuscular education;Manual Therapy;Scar mobilization;Splinting;Patient/family education;Therapeutic activities;Passive range of motion    Plan work on hand strength (to use sock aide and for functional tasks), standing balance with prosthetic    Consulted and Agree with Plan of Care Patient             Patient will benefit from skilled therapeutic  intervention in order to improve the following deficits and impairments:   Body Structure / Function / Physical Skills: ADL, Dexterity, Flexibility, ROM, Strength, IADL, FMC, Edema, Coordination, Balance, Body mechanics, Decreased knowledge of precautions, Endurance, Pain, Sensation, UE functional use, Skin integrity, Mobility, GMC, Fascial restriction, Decreased knowledge of use of DME       Visit Diagnosis: Unsteadiness on feet  Muscle weakness (generalized)  Stiffness of left elbow, not elsewhere classified  Other abnormalities of gait and mobility  Stiffness of left hand, not elsewhere classified  Stiffness of right elbow, not elsewhere classified  Stiffness of right hand, not elsewhere classified    Problem List Patient Active Problem List   Diagnosis Date Noted   Diabetes mellitus type 2, uncontrolled, with complications ([place]) 0000000   Diabetic nephropathy ([place]) 0000000   Chronic systolic CHF (congestive heart failure) ([place]) 04/16/2021   Hypoglycemia 04/15/2021   Hyperglycemia 04/15/2021   DKA (diabetic ketoacidosis) ([place]) 01/24/2021   COVID-19 virus infection 01/24/2021   Syncope 01/24/2021   ESRD (end stage renal disease) ([place]) 01/24/2021   Congestive heart failure (CHF) ([place]) 01/24/2021   Hypotension 01/24/2021   Shock ([place]) 01/24/2021    Zachery Conch, OT/L 09/26/2021, 4:16 PM  [facility] [address] [place], [place], [address] Phone: [phone]   Fax:  [phone]  Name: MOISES KOLTON MRN: [id number] Date of Birth: 05-Feb-1956

## 2021-09-26 NOTE — Therapy (Addendum)
[facility] [address] [place], [place], [address] Phone: [phone]   Fax:  [phone]  Physical Therapy Treatment  Patient Details  Name: Jose Robinson MRN: [id number] Date of Birth: 1956-03-02 Referring Provider (PT): Buzzy Han, MD   Encounter Date: 09/26/2021   PT End of Session - 09/26/21 1524     Visit Number 12    Number of Visits 25    Date for PT Re-Evaluation 10/21/21    Authorization Type [employer] Medicare/Medicaid (follow Medicare guidelines); Recert [id number]    Progress Note Due on Visit 19    PT Start Time 1445    PT Stop Time 1530    PT Time Calculation (min) 45 min    Equipment Utilized During Treatment Gait belt    Activity Tolerance Patient tolerated treatment well    Behavior During Therapy WFL for tasks assessed/performed             Past Medical History:  Diagnosis Date   Diabetes mellitus without complication ([place])    ESRD on hemodialysis ([place])    Hypertension     Past Surgical History:  Procedure Laterality Date   BACK SURGERY     BELOW KNEE LEG AMPUTATION      There were no vitals filed for this visit.   Subjective Assessment - 09/26/21 1452     Subjective no new falls.    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 no issues    How long can you stand comfortably? <30 sec with walker    How long can you walk comfortably? within his room    Patient Stated Goals Improve balance    Pain Onset More than a month ago                Sit to stand: 1 x 5 no HHA, required min A, cues for nose over toes, pt stabilizes with knees against mat table Gait training with 180 deg turn and sit to stand transfer: from mat to chair placed 10 feet away: 4x, mod A required, cues for 3 step gait pattern, used quad cane on R hand for 1st  lap and then in L hand for remaining laps Gait training with RW: 1 x 230 feet  and 1 x 345 feet with SBA and intermittent CGA                          PT Short Term Goals - 08/24/21 1400       PT SHORT TERM GOAL #1   Title Pt will demo 16/56 on BBS to decrease fall risk and improve walking endurance    Baseline 13/56 (06/22/21); 14/56 (07/18/21)    Time 4    Period Weeks    Status On-going    Target Date 09/21/21      PT SHORT TERM GOAL #2   Title Pt will demo <38 sec with TUG wit RW to improve functional mobility    Baseline 43 sec with RW (06/22/21); 36 sec with RW (07/18/21)    Time 4    Period Weeks    Status Achieved    Target Date 07/20/21      PT SHORT TERM GOAL #3   Title Patient will be able to stand unassisted for 2 minutes to be able to perform safety with  ADLs    Baseline <10 sec (08/26/21)    Time 4    Period Weeks    Status Revised    Target Date 09/21/21               PT Long Term Goals - 08/24/21 1257       PT LONG TERM GOAL #1   Title Pt will demo at least 0.15 m/s improvement in his gait speed with RW to improve functional mobility    Baseline 0.72ms with RW (06/22/21); 0.479m with RW (08/24/21)    Time 8    Period Weeks    Status On-going    Target Date 10/19/21      PT LONG TERM GOAL #2   Title Pt will demo <35 sec with TUG with RW to improve functional mobility and decrease fall risk    Baseline 43 sec with RW (06/22/21); 37 sec with RW (08/24/21)    Time 8    Period Weeks    Status On-going    Target Date 10/19/21      PT LONG TERM GOAL #3   Title Pt will demo >19/56 on BBS to improve functional balance and reduce fall risk    Baseline 13/56 (06/22/21)    Time 8    Period Weeks    Status On-going    Target Date 10/19/21                   Plan - 09/26/21 1454     Clinical Impression Statement Today's skilled session was focused on progressing transfers and gait with decreasing assistance. Pt able to  weight shift better and demo improved balance with quad cane in L UE compared to R UE. Pt demo increased wlaking endurance today    Personal Factors and Comorbidities Age;Comorbidity 3+    Comorbidities ESRD on HD TTS, CAD, PAD, chronic systolic heart failure, COPD, insulin-dependent diabetes, history of right BKA    Examination-Activity Limitations Bathing;Bed Mobility;Carry;Dressing;Hygiene/Grooming;Lift;Self Feeding;Sleep;Squat;Stairs;Stand;Toileting;Transfers    Examination-Participation Restrictions Cleaning;Community Activity;Laundry;Medication Management;Meal Prep;Occupation;Shop;Church    Stability/Clinical Decision Making Stable/Uncomplicated    Rehab Potential Good    PT Frequency 2x / week    PT Duration 8 weeks    PT Treatment/Interventions ADLs/Self Care Home Management;Cryotherapy;Moist Heat;Gait training;Stair training;Functional mobility training;Therapeutic activities;Therapeutic exercise;Balance training;Neuromuscular re-education;Patient/family education;Orthotic Fit/Training;Prosthetic Training;Manual techniques;Scar mobilization;Passive range of motion;Energy conservation;Joint Manipulations    PT Next Visit Plan [facility] appt 10/06/21.  continue gait training focused on increased ambulation distance. Static Standing Balance, Weight Shift Activities, improving upright posture. Has process been started to get new prosthesis?current one also too short.    PT Home Exercise Plan Access Code: [email or web address]  Consulted and Agree with Plan of Care Patient             Patient will benefit from skilled therapeutic intervention in order to improve the following deficits and impairments:  Abnormal gait, Decreased activity tolerance, Decreased balance, Decreased coordination, Decreased endurance, Decreased mobility, Decreased range of motion, Decreased safety awareness, Difficulty walking, Decreased strength, Increased fascial restricitons, Impaired flexibility, Improper body mechanics,  Postural dysfunction, Prosthetic Dependency  Visit Diagnosis: Unsteadiness on feet  Muscle weakness (generalized)  Other abnormalities of gait and mobility  [facility] ([facility]    Problem List Patient Active Problem List   Diagnosis Date Noted   Diabetes mellitus type 2, uncontrolled, with complications ([facility]040000000 Diabetic nephropathy ([facility]040000000 Chronic systolic CHF (congestive heart failure) ([facility]04/18/2022  Hypoglycemia 04/15/2021   Hyperglycemia 04/15/2021   DKA (diabetic ketoacidosis) ([place]) 01/24/2021   COVID-19 virus infection 01/24/2021   Syncope 01/24/2021   ESRD (end stage renal disease) ([place]) 01/24/2021   Congestive heart failure (CHF) ([place]) 01/24/2021   Hypotension 01/24/2021   Shock ([place]) 01/24/2021    Kerrie Pleasure, PT 09/26/2021, 3:24 PM  [facility] [address] [place], [place], [address] Phone: [phone]   Fax:  [phone]  Name: Jose Robinson MRN: [id number] Date of Birth: 1956-09-09

## 2021-09-28 ENCOUNTER — Ambulatory Visit: Payer: Medicare (Managed Care) | Admitting: Occupational Therapy

## 2021-09-28 ENCOUNTER — Other Ambulatory Visit: Payer: Self-pay

## 2021-09-28 ENCOUNTER — Encounter: Payer: Self-pay | Admitting: Physical Therapy

## 2021-09-28 ENCOUNTER — Ambulatory Visit: Payer: Medicare (Managed Care) | Admitting: Physical Therapy

## 2021-09-28 DIAGNOSIS — M25621 Stiffness of right elbow, not elsewhere classified: Secondary | ICD-10-CM

## 2021-09-28 DIAGNOSIS — R2681 Unsteadiness on feet: Secondary | ICD-10-CM

## 2021-09-28 DIAGNOSIS — M6281 Muscle weakness (generalized): Secondary | ICD-10-CM

## 2021-09-28 DIAGNOSIS — M25642 Stiffness of left hand, not elsewhere classified: Secondary | ICD-10-CM

## 2021-09-28 DIAGNOSIS — M25641 Stiffness of right hand, not elsewhere classified: Secondary | ICD-10-CM

## 2021-09-28 DIAGNOSIS — M25622 Stiffness of left elbow, not elsewhere classified: Secondary | ICD-10-CM

## 2021-09-28 DIAGNOSIS — R2689 Other abnormalities of gait and mobility: Secondary | ICD-10-CM | POA: Diagnosis not present

## 2021-09-28 NOTE — Therapy (Addendum)
[facility] [address] [place], [place], [address] Phone: [phone]   Fax:  [phone]  Physical Therapy Treatment  Patient Details  Name: Jose Robinson MRN: [id number] Date of Birth: 21-Jan-1956 Referring Provider (PT): Buzzy Han, MD   Encounter Date: 09/28/2021   PT End of Session - 09/28/21 1451     Visit Number 13    Number of Visits 25    Date for PT Re-Evaluation 10/21/21    Authorization Type [employer] Medicare/Medicaid (follow Medicare guidelines); Recert 3/82/50 to 53/97/67    Progress Note Due on Visit 19    PT Start Time 1447    PT Stop Time 1530    PT Time Calculation (min) 43 min    Equipment Utilized During Treatment Gait belt    Activity Tolerance Patient tolerated treatment well    Behavior During Therapy WFL for tasks assessed/performed             Past Medical History:  Diagnosis Date   Diabetes mellitus without complication ([place])    ESRD on hemodialysis ([place])    Hypertension     Past Surgical History:  Procedure Laterality Date   BACK SURGERY     BELOW KNEE LEG AMPUTATION      There were no vitals filed for this visit.   Subjective Assessment - 09/28/21 1450     Subjective No new complaints. No falls. No pain, just usual "aches". Appt with [facility] in actually on 10/08/2021 and the doctor on 10/05/2021 to get order for new prosthesis.    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 no issues    How long can you stand comfortably? <30 sec with walker    How long can you walk comfortably? within his room    Patient Stated Goals Improve balance    Currently in Pain? No/denies    Pain Score 0-No pain                [facility] PT Assessment - 09/28/21 1459       Berg Balance Test   Sit to Stand Able to stand  independently using hands     Standing Unsupported Able to stand 30 seconds unsupported   1 minute, 08.37 sec's   Sitting with Back Unsupported but Feet Supported on Floor or Stool Able to sit safely and securely 2 minutes    Stand to Sit Controls descent by using hands    Transfers Able to transfer safely, definite need of hands    Standing Unsupported with Eyes Closed Able to stand 3 seconds   10 sec's with min guard assist   Standing Unsupported with Feet Together Needs help to attain position and unable to hold for 15 seconds    From Standing, Reach Forward with Outstretched Arm Loses balance while trying/requires external support    From Standing Position, Pick up Object from Floor Unable to try/needs assist to keep balance    From Standing Position, Turn to Look Behind Over each Shoulder Needs assist to keep from losing balance and falling    Turn 360 Degrees Needs assistance while turning    Standing Unsupported, Alternately Place Feet on Step/Stool Needs assistance to keep from falling or unable to try    Standing Unsupported, One Foot in Front Needs help to step but can hold 15 seconds    Standing on One Leg  Unable to try or needs assist to prevent fall    Total Score 18    Berg comment: 18/56 scored today. <36 high risk for falls              09/28/21 1459  Transfers  Transfers Sit to Stand;Stand to [employer] Transfers  Sit to Stand 5: Supervision;With upper extremity assist;From bed;From chair/3-in-1  Stand to Sit 5: Supervision;With upper extremity assist;To bed;To chair/3-in-1  Ambulation/Gait  Ambulation/Gait Yes  Ambulation/Gait Assistance 4: Min guard  Ambulation/Gait Assistance Details cues to stand up tall, for walker position with gait and for increased step length.  Ambulation Distance (Feet) 210 Feet (x1)  Assistive device Rolling walker;Prosthesis  Gait Pattern Step-through pattern;Decreased step length - right;Decreased step length - left;Trunk flexed  Ambulation Surface  Level;Indoor      PT Short Term Goals - 09/28/21 1700       PT SHORT TERM GOAL #1   Title Pt will demo 16/56 on BBS to decrease fall risk and improve walking endurance    Baseline 09/28/21: 18/56 scored today    Status Achieved      PT SHORT TERM GOAL #2   Title Pt will demo <38 sec with TUG wit RW to improve functional mobility    Baseline 36 sec with RW (07/18/21)    Status Achieved      PT SHORT TERM GOAL #3   Title Patient will be able to stand unassisted for 2 minutes to be able to perform safety with ADLs    Baseline 09/28/21: 1 minute, 8.37 sec's, improved from < 10 sec's, just not to goal level    Status Partially Met                   PT Long Term Goals - 08/24/21 1257       PT LONG TERM GOAL #1   Title Pt will demo at least 0.15 m/s improvement in his gait speed with RW to improve functional mobility    Baseline 0.33ms with RW (06/22/21); 0.457m with RW (08/24/21)    Time 8    Period Weeks    Status On-going    Target Date 10/19/21      PT LONG TERM GOAL #2   Title Pt will demo <35 sec with TUG with RW to improve functional mobility and decrease fall risk    Baseline 43 sec with RW (06/22/21); 37 sec with RW (08/24/21)    Time 8    Period Weeks    Status On-going    Target Date 10/19/21      PT LONG TERM GOAL #3   Title Pt will demo >19/56 on BBS to improve functional balance and reduce fall risk    Baseline 13/56 (06/22/21)    Time 8    Period Weeks    Status On-going    Target Date 10/19/21               09/28/21 1452  Plan  Clinical Impression Statement Today's skilled session initially focused on progress toward STGs that were due on 09/21/21. Pt has met the [employer] goal with score of 19/56. Pt partially met STG #3 for standing unsupported as he was able to increase this time, just not to goal level. Remainder of session continued to address activity tolerance and gait with RW. Rest breaks taken as needed due to fatigue. No other  issues noted or reported in session. The pt is progressing and should benefit  from continued PT to progress toward unmet goals.  Personal Factors and Comorbidities Age;Comorbidity 3+  Comorbidities ESRD on HD TTS, CAD, PAD, chronic systolic heart failure, COPD, insulin-dependent diabetes, history of right BKA  Examination-Activity Limitations Bathing;Bed Mobility;Carry;Dressing;Hygiene/Grooming;Lift;Self Feeding;Sleep;Squat;Stairs;Stand;Toileting;Transfers  Examination-Participation Restrictions Cleaning;Community Activity;Laundry;Medication Management;Meal Prep;Occupation;Shop;Church  Pt will benefit from skilled therapeutic intervention in order to improve on the following deficits Abnormal gait;Decreased activity tolerance;Decreased balance;Decreased coordination;Decreased endurance;Decreased mobility;Decreased range of motion;Decreased safety awareness;Difficulty walking;Decreased strength;Increased fascial restricitons;Impaired flexibility;Improper body mechanics;Postural dysfunction;Prosthetic Dependency  Stability/Clinical Decision Making Stable/Uncomplicated  Rehab Potential Good  PT Frequency 2x / week  PT Duration 8 weeks  PT Treatment/Interventions ADLs/Self Care Home Management;Cryotherapy;Moist Heat;Gait training;Stair training;Functional mobility training;Therapeutic activities;Therapeutic exercise;Balance training;Neuromuscular re-education;Patient/family education;Orthotic Fit/Training;Prosthetic Training;Manual techniques;Scar mobilization;Passive range of motion;Energy conservation;Joint Manipulations  PT Next Visit Plan [facility] appt 10/06/21.  continue gait training focused on increased ambulation distance. Static Standing Balance, Weight Shift Activities, improving upright posture.  PT Home Exercise Plan Access Code: [email or web address]  Consulted and Agree with Plan of Care Patient          Patient will benefit from skilled therapeutic intervention in order to improve the following  deficits and impairments:  Abnormal gait, Decreased activity tolerance, Decreased balance, Decreased coordination, Decreased endurance, Decreased mobility, Decreased range of motion, Decreased safety awareness, Difficulty walking, Decreased strength, Increased fascial restricitons, Impaired flexibility, Improper body mechanics, Postural dysfunction, Prosthetic Dependency  Visit Diagnosis: Unsteadiness on feet  Muscle weakness (generalized)  Other abnormalities of gait and mobility     Problem List Patient Active Problem List   Diagnosis Date Noted   Diabetes mellitus type 2, uncontrolled, with complications ([place]) 87/19/9412   Diabetic nephropathy ([place]) 90/47/5339   Chronic systolic CHF (congestive heart failure) ([place]) 04/16/2021   Hypoglycemia 04/15/2021   Hyperglycemia 04/15/2021   DKA (diabetic ketoacidosis) ([place]) 01/24/2021   COVID-19 virus infection 01/24/2021   Syncope 01/24/2021   ESRD (end stage renal disease) ([place]) 01/24/2021   Congestive heart failure (CHF) ([place]) 01/24/2021   Hypotension 01/24/2021   Shock ([place]) 01/24/2021    Willow Ora, PTA, [facility] [address], [place], [place] [address] [phone] 09/28/21, 11:02 PM   Name: Jose Robinson MRN: [id number] Date of Birth: [birth year]

## 2021-09-28 NOTE — Therapy (Signed)
[facility] [address] [place], [place], [address] Phone: [phone]   Fax:  [phone]  Occupational Therapy Treatment  Patient Details  Name: Jose Robinson MRN: [id number] Date of Birth: 07-Aug-1956 Referring Provider (OT): Tobias Alexander   Encounter Date: 09/28/2021   OT End of Session - 09/28/21 1406     Visit Number 17    Number of Visits 30   16 visits added at renewal   Date for OT Re-Evaluation 11/02/21   10 weeks + at renewal for 16 visits and to accomodate any scheduling conflicts   Authorization Type [facility] Medicare/ Medicaid    OT Start Time 1404    OT Stop Time 1445    OT Time Calculation (min) 41 min    Activity Tolerance Patient tolerated treatment well    Behavior During Therapy WFL for tasks assessed/performed             Past Medical History:  Diagnosis Date   Diabetes mellitus without complication ([place])    ESRD on hemodialysis ([place])    Hypertension     Past Surgical History:  Procedure Laterality Date   BACK SURGERY     BELOW KNEE LEG AMPUTATION      There were no vitals filed for this visit.   Subjective Assessment - 09/28/21 1405     Subjective  "doing alright with these storms"    Pertinent History Rt BKA, DM, neuropathy    Currently in Pain? No/denies                       Table Slides for warming up BUE shoulders x 15 reps  Hand Gripper: with LUE and RUE on level 1 with black spring. Pt picked up 1 inch blocks with gripper with mod/max drops and mod/max difficulty with LUE and min difficulty/drops with RUE.            OT Short Term Goals - 07/18/21 1150       OT SHORT TERM GOAL #1   Title Patient will complete an HEP designed to improve range of motion in bilateral hands, forearms, and elbows    Baseline No HEP    Time 4    Period Weeks    Status Achieved      OT SHORT TERM GOAL #2   Title Patient will complete an HEP designed to  build upper body strength    Baseline No HEP    Time 4    Period Weeks    Status Achieved      OT SHORT TERM GOAL #3   Title Patient will don pull over shirt with set up assistance    Baseline per patient mod assist at times    Time 4    Period Weeks    Status Achieved      OT SHORT TERM GOAL #4   Title Patient will verbalize understanding of process to don prosthesis for RLE    Baseline Dependent    Time 4    Period Weeks    Status Achieved      OT SHORT TERM GOAL #5   Title Patient will demonstrate ability to cut food on plate with set up assistance    Time 4    Period Weeks    Status Achieved               OT Long Term Goals - 08/24/21 1322       OT  LONG TERM GOAL #1   Title Patient will complete updated HEP for BUE rane, strength and functional use.    Baseline No HEP    Period Weeks    Status On-going      OT LONG TERM GOAL #2   Title Patient will don elastic waist shorts or pants with min assist    Baseline dependent    Time 8    Period Weeks    Status On-going   someone has to assist with pulling shorts up at very end     OT LONG TERM GOAL #3   Title Patient will demonstrate 5 lb increase in grip strength in BUE to aide in helping to don/doff prosthesis    Baseline less than 10 lbs on eval  7/20 - 12lb right 10 lbs left  8/26: 15.4 R, 11.0 L    Time 8    Period Weeks    Status On-going   15.4 R, 11.0 L on 08/24/21     OT LONG TERM GOAL #4   Title Patient will toilet himself with no more than min assist    Baseline total assistance    Time 8    Period Weeks    Status On-going   assistance only for pulling shorts up and hygiene. 08/24/21     OT LONG TERM GOAL #5   Title Patient will demonstrate 90% composite flexion in BUE to aide in functional grasp    Baseline 50% left, 75% right    Time 8    Period Weeks    Status On-going   approx 80% 08/24/21                  Plan - 09/28/21 1418     Clinical Impression Statement Pt continues  to progress towards goals.    OT Occupational Profile and History Detailed Assessment- Review of Records and additional review of physical, cognitive, psychosocial history related to current functional performance    Occupational performance deficits (Please refer to evaluation for details): ADL's    Body Structure / Function / Physical Skills ADL;Dexterity;Flexibility;ROM;Strength;IADL;FMC;Edema;Coordination;Balance;Body mechanics;Decreased knowledge of precautions;Endurance;Pain;Sensation;UE functional use;Skin integrity;Mobility;GMC;Fascial restriction;Decreased knowledge of use of DME    Rehab Potential Fair    Clinical Decision Making Several treatment options, min-mod task modification necessary    Comorbidities Affecting Occupational Performance: May have comorbidities impacting occupational performance    Modification or Assistance to Complete Evaluation  Min-Moderate modification of tasks or assist with assess necessary to complete eval    OT Frequency 2x / week    OT Duration Other (comment)   Renewal: 16 visits over 10 weeks to accomodate for any scheduling conflicts   OT Treatment/Interventions Self-care/ADL training;Therapeutic exercise;DME and/or AE instruction;Functional Mobility Training;Balance training;Fluidtherapy;Neuromuscular education;Manual Therapy;Scar mobilization;Splinting;Patient/family education;Therapeutic activities;Passive range of motion    Plan work on hand strength (to use sock aide and for functional tasks), standing balance with prosthetic, ROM BUE    Consulted and Agree with Plan of Care Patient             Patient will benefit from skilled therapeutic intervention in order to improve the following deficits and impairments:   Body Structure / Function / Physical Skills: ADL, Dexterity, Flexibility, ROM, Strength, IADL, FMC, Edema, Coordination, Balance, Body mechanics, Decreased knowledge of precautions, Endurance, Pain, Sensation, UE functional use, Skin  integrity, Mobility, GMC, Fascial restriction, Decreased knowledge of use of DME       Visit Diagnosis: Unsteadiness on feet  Muscle weakness (generalized)  Other  abnormalities of gait and mobility  Stiffness of left elbow, not elsewhere classified  Stiffness of left hand, not elsewhere classified  Stiffness of right elbow, not elsewhere classified  Stiffness of right hand, not elsewhere classified    Problem List Patient Active Problem List   Diagnosis Date Noted   Diabetes mellitus type 2, uncontrolled, with complications ([place]) 0000000   Diabetic nephropathy ([place]) 0000000   Chronic systolic CHF (congestive heart failure) ([place]) 04/16/2021   Hypoglycemia 04/15/2021   Hyperglycemia 04/15/2021   DKA (diabetic ketoacidosis) ([place]) 01/24/2021   COVID-19 virus infection 01/24/2021   Syncope 01/24/2021   ESRD (end stage renal disease) ([place]) 01/24/2021   Congestive heart failure (CHF) ([place]) 01/24/2021   Hypotension 01/24/2021   Shock ([place]) 01/24/2021    Zachery Conch, OT/L 09/28/2021, 2:35 PM  [facility] [address] [place], [place], [address] Phone: [phone]   Fax:  [phone]  Name: Jose Robinson MRN: [id number] Date of Birth: 12-07-1956

## 2021-10-01 ENCOUNTER — Ambulatory Visit: Payer: Medicare (Managed Care) | Admitting: Occupational Therapy

## 2021-10-01 ENCOUNTER — Ambulatory Visit: Payer: Medicare (Managed Care) | Attending: Family Medicine

## 2021-10-01 DIAGNOSIS — M6281 Muscle weakness (generalized): Secondary | ICD-10-CM | POA: Insufficient documentation

## 2021-10-01 DIAGNOSIS — M25622 Stiffness of left elbow, not elsewhere classified: Secondary | ICD-10-CM | POA: Insufficient documentation

## 2021-10-01 DIAGNOSIS — M25642 Stiffness of left hand, not elsewhere classified: Secondary | ICD-10-CM | POA: Insufficient documentation

## 2021-10-01 DIAGNOSIS — Z89511 Acquired absence of right leg below knee: Secondary | ICD-10-CM | POA: Insufficient documentation

## 2021-10-01 DIAGNOSIS — M25621 Stiffness of right elbow, not elsewhere classified: Secondary | ICD-10-CM | POA: Insufficient documentation

## 2021-10-01 DIAGNOSIS — R2689 Other abnormalities of gait and mobility: Secondary | ICD-10-CM | POA: Insufficient documentation

## 2021-10-01 DIAGNOSIS — R2681 Unsteadiness on feet: Secondary | ICD-10-CM | POA: Insufficient documentation

## 2021-10-01 DIAGNOSIS — M25641 Stiffness of right hand, not elsewhere classified: Secondary | ICD-10-CM | POA: Insufficient documentation

## 2021-10-03 ENCOUNTER — Other Ambulatory Visit: Payer: Self-pay

## 2021-10-03 ENCOUNTER — Ambulatory Visit: Payer: Medicare (Managed Care) | Admitting: Occupational Therapy

## 2021-10-03 ENCOUNTER — Encounter: Payer: Self-pay | Admitting: Occupational Therapy

## 2021-10-03 ENCOUNTER — Ambulatory Visit: Payer: Medicare (Managed Care)

## 2021-10-03 DIAGNOSIS — R2689 Other abnormalities of gait and mobility: Secondary | ICD-10-CM | POA: Diagnosis present

## 2021-10-03 DIAGNOSIS — M25642 Stiffness of left hand, not elsewhere classified: Secondary | ICD-10-CM

## 2021-10-03 DIAGNOSIS — R2681 Unsteadiness on feet: Secondary | ICD-10-CM

## 2021-10-03 DIAGNOSIS — Z89511 Acquired absence of right leg below knee: Secondary | ICD-10-CM

## 2021-10-03 DIAGNOSIS — M25622 Stiffness of left elbow, not elsewhere classified: Secondary | ICD-10-CM | POA: Diagnosis not present

## 2021-10-03 DIAGNOSIS — M25621 Stiffness of right elbow, not elsewhere classified: Secondary | ICD-10-CM | POA: Diagnosis present

## 2021-10-03 DIAGNOSIS — M6281 Muscle weakness (generalized): Secondary | ICD-10-CM

## 2021-10-03 DIAGNOSIS — M25641 Stiffness of right hand, not elsewhere classified: Secondary | ICD-10-CM

## 2021-10-03 NOTE — Therapy (Signed)
[facility] [address] [place], [place], [address] Phone: [phone]   Fax:  [phone]  Occupational Therapy Treatment  Patient Details  Name: Jose Robinson MRN: [id number] Date of Birth: May 15, 1956 Referring Provider (OT): Tobias Alexander   Encounter Date: 10/03/2021   OT End of Session - 10/03/21 1537     Visit Number 18    Number of Visits 30   16 visits added at renewal   Date for OT Re-Evaluation 11/02/21   10 weeks + at renewal for 16 visits and to accomodate any scheduling conflicts   Authorization Type [facility] Medicare/ Medicaid    OT Start Time 1534    OT Stop Time 1615    OT Time Calculation (min) 41 min    Activity Tolerance Patient tolerated treatment well    Behavior During Therapy WFL for tasks assessed/performed             Past Medical History:  Diagnosis Date   Diabetes mellitus without complication ([place])    ESRD on hemodialysis ([place])    Hypertension     Past Surgical History:  Procedure Laterality Date   BACK SURGERY     BELOW KNEE LEG AMPUTATION      There were no vitals filed for this visit.   Subjective Assessment - 10/03/21 1536     Subjective  "little rough today - i was working out today and I shouldn't have"    Pertinent History Rt BKA, DM, neuropathy    Currently in Pain? Yes    Pain Score 7     Pain Location Generalized   all over   Pain Descriptors / Indicators Aching;Sore    Pain Type Acute pain    Pain Onset Today    Pain Frequency Occasional    Aggravating Factors  after PT, working                 Supine ROM for BUE for overhead reach, horizontal abduction, external rotation  Physioball stretch x 10 reps forward reaching BUE  Upright sitting posture with working on trunk strengthening and coordination with rotation and chest elevation and scap retraction. Adjusted wheelchair cushions for increasing upright sitting posture by moving pillow  under [place] and moving second pillow to lumbar region to facilitate thoracic extension.                   OT Short Term Goals - 07/18/21 1150       OT SHORT TERM GOAL #1   Title Patient will complete an HEP designed to improve range of motion in bilateral hands, forearms, and elbows    Baseline No HEP    Time 4    Period Weeks    Status Achieved      OT SHORT TERM GOAL #2   Title Patient will complete an HEP designed to build upper body strength    Baseline No HEP    Time 4    Period Weeks    Status Achieved      OT SHORT TERM GOAL #3   Title Patient will don pull over shirt with set up assistance    Baseline per patient mod assist at times    Time 4    Period Weeks    Status Achieved      OT SHORT TERM GOAL #4   Title Patient will verbalize understanding of process to don prosthesis for RLE    Baseline Dependent    Time  4    Period Weeks    Status Achieved      OT SHORT TERM GOAL #5   Title Patient will demonstrate ability to cut food on plate with set up assistance    Time 4    Period Weeks    Status Achieved               OT Long Term Goals - 08/24/21 1322       OT LONG TERM GOAL #1   Title Patient will complete updated HEP for BUE rane, strength and functional use.    Baseline No HEP    Period Weeks    Status On-going      OT LONG TERM GOAL #2   Title Patient will don elastic waist shorts or pants with min assist    Baseline dependent    Time 8    Period Weeks    Status On-going   someone has to assist with pulling shorts up at very end     OT LONG TERM GOAL #3   Title Patient will demonstrate 5 lb increase in grip strength in BUE to aide in helping to don/doff prosthesis    Baseline less than 10 lbs on eval  7/20 - 12lb right 10 lbs left  8/26: 15.4 R, 11.0 L    Time 8    Period Weeks    Status On-going   15.4 R, 11.0 L on 08/24/21     OT LONG TERM GOAL #4   Title Patient will toilet himself with no more than min assist     Baseline total assistance    Time 8    Period Weeks    Status On-going   assistance only for pulling shorts up and hygiene. 08/24/21     OT LONG TERM GOAL #5   Title Patient will demonstrate 90% composite flexion in BUE to aide in functional grasp    Baseline 50% left, 75% right    Time 8    Period Weeks    Status On-going   approx 80% 08/24/21                  Plan - 10/03/21 1617     Clinical Impression Statement Pt with slow and steady progress towards goals. Pt continues with decreased BUE strength and coordination impeding overall independence.    OT Occupational Profile and History Detailed Assessment- Review of Records and additional review of physical, cognitive, psychosocial history related to current functional performance    Occupational performance deficits (Please refer to evaluation for details): ADL's    Body Structure / Function / Physical Skills ADL;Dexterity;Flexibility;ROM;Strength;IADL;FMC;Edema;Coordination;Balance;Body mechanics;Decreased knowledge of precautions;Endurance;Pain;Sensation;UE functional use;Skin integrity;Mobility;GMC;Fascial restriction;Decreased knowledge of use of DME    Rehab Potential Fair    Clinical Decision Making Several treatment options, min-mod task modification necessary    Comorbidities Affecting Occupational Performance: May have comorbidities impacting occupational performance    Modification or Assistance to Complete Evaluation  Min-Moderate modification of tasks or assist with assess necessary to complete eval    OT Frequency 2x / week    OT Duration Other (comment)   Renewal: 16 visits over 10 weeks to accomodate for any scheduling conflicts   OT Treatment/Interventions Self-care/ADL training;Therapeutic exercise;DME and/or AE instruction;Functional Mobility Training;Balance training;Fluidtherapy;Neuromuscular education;Manual Therapy;Scar mobilization;Splinting;Patient/family education;Therapeutic activities;Passive range of  motion    Plan work on hand strength (to use sock aide and for functional tasks), standing balance with prosthetic, ROM BUE    Consulted and Agree  with Plan of Care Patient             Patient will benefit from skilled therapeutic intervention in order to improve the following deficits and impairments:   Body Structure / Function / Physical Skills: ADL, Dexterity, Flexibility, ROM, Strength, IADL, FMC, Edema, Coordination, Balance, Body mechanics, Decreased knowledge of precautions, Endurance, Pain, Sensation, UE functional use, Skin integrity, Mobility, GMC, Fascial restriction, Decreased knowledge of use of DME       Visit Diagnosis: Unsteadiness on feet  Muscle weakness (generalized)  Stiffness of right hand, not elsewhere classified  Other abnormalities of gait and mobility  Stiffness of left hand, not elsewhere classified  Stiffness of right elbow, not elsewhere classified  Stiffness of left elbow, not elsewhere classified    Problem List Patient Active Problem List   Diagnosis Date Noted   Diabetes mellitus type 2, uncontrolled, with complications 0000000   Diabetic nephropathy ([place]) 0000000   Chronic systolic CHF (congestive heart failure) ([place]) 04/16/2021   Hypoglycemia 04/15/2021   Hyperglycemia 04/15/2021   DKA (diabetic ketoacidosis) ([place]) 01/24/2021   COVID-19 virus infection 01/24/2021   Syncope 01/24/2021   ESRD (end stage renal disease) ([place]) 01/24/2021   Congestive heart failure (CHF) ([place]) 01/24/2021   Hypotension 01/24/2021   Shock ([place]) 01/24/2021    Zachery Conch, OT/L 10/03/2021, 4:17 PM  [facility] [address] [place], [place], [address] Phone: [phone]   Fax:  [phone]  Name: Jose Robinson MRN: [id number] Date of Birth: Jun 24, 1956

## 2021-10-03 NOTE — Therapy (Signed)
[facility] [address] [place], [place], [address] Phone: [phone]   Fax:  [phone]  Physical Therapy Treatment  Patient Details  Name: Jose Robinson MRN: [id number] Date of Birth: 1956-01-30 Referring Provider (PT): Buzzy Han, MD   Encounter Date: 10/03/2021   PT End of Session - 10/03/21 1559     Visit Number 14    Number of Visits 25    Date for PT Re-Evaluation 10/21/21    Authorization Type [employer] Medicare/Medicaid (follow Medicare guidelines); Recert 9/41/74 to 08/13/47    Progress Note Due on Visit 19    PT Start Time 1450    PT Stop Time 1535    PT Time Calculation (min) 45 min    Equipment Utilized During Treatment Gait belt    Activity Tolerance Patient tolerated treatment well    Behavior During Therapy WFL for tasks assessed/performed             Past Medical History:  Diagnosis Date   Diabetes mellitus without complication ([place])    ESRD on hemodialysis ([place])    Hypertension     Past Surgical History:  Procedure Laterality Date   BACK SURGERY     BELOW KNEE LEG AMPUTATION      There were no vitals filed for this visit.   Subjective Assessment - 10/03/21 1519     Subjective Pt reports I am very tired today I did some exercises this morning.    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 no issues    How long can you stand comfortably? <30 sec with walker    How long can you walk comfortably? within his room    Patient Stated Goals Improve balance             Gait training: 1 x 2 feet, 1 x 3 feet, 1 x 5 feet with quad cane and mod to max a with wheelchair follow Sit to stand with maintaining balance for 5 sec before sitting down: cues to shift weight on L LE, cues to stand up tall, cues to control eccentric lowering Sit to stand with  throwing boxing jabs: 3x, 3x, 5x, 10x Gait training: 1 x 280' with walker, cues to stand up tall, cues to lock R knee with stance phase, 1 x 50'                           PT Short Term Goals - 09/28/21 1700       PT SHORT TERM GOAL #1   Title Pt will demo 16/56 on BBS to decrease fall risk and improve walking endurance    Baseline 09/28/21: 18/56 scored today    Status Achieved      PT SHORT TERM GOAL #2   Title Pt will demo <38 sec with TUG wit RW to improve functional mobility    Baseline 36 sec with RW (07/18/21)    Status Achieved      PT SHORT TERM GOAL #3   Title Patient will be able to stand unassisted for 2 minutes to be able to perform safety with ADLs    Baseline 09/28/21: 1 minute, 8.37 sec's, improved from < 10 sec's, just not to goal level    Status Partially Met  PT Long Term Goals - 08/24/21 1257       PT LONG TERM GOAL #1   Title Pt will demo at least 0.15 m/s improvement in his gait speed with RW to improve functional mobility    Baseline 0.26ms with RW (06/22/21); 0.461m with RW (08/24/21)    Time 8    Period Weeks    Status On-going    Target Date 10/19/21      PT LONG TERM GOAL #2   Title Pt will demo <35 sec with TUG with RW to improve functional mobility and decrease fall risk    Baseline 43 sec with RW (06/22/21); 37 sec with RW (08/24/21)    Time 8    Period Weeks    Status On-going    Target Date 10/19/21      PT LONG TERM GOAL #3   Title Pt will demo >19/56 on BBS to improve functional balance and reduce fall risk    Baseline 13/56 (06/22/21)    Time 8    Period Weeks    Status On-going    Target Date 10/19/21                   Plan - 10/03/21 1523     Clinical Impression Statement Pt required increased assistance with walking with quad cane due to faituge and poor balance today. Pt demonstrated poor standing balance compared to previous session.    Personal Factors and Comorbidities  Age;Comorbidity 3+    Comorbidities ESRD on HD TTS, CAD, PAD, chronic systolic heart failure, COPD, insulin-dependent diabetes, history of right BKA    Examination-Activity Limitations Bathing;Bed Mobility;Carry;Dressing;Hygiene/Grooming;Lift;Self Feeding;Sleep;Squat;Stairs;Stand;Toileting;Transfers    Examination-Participation Restrictions Cleaning;Community Activity;Laundry;Medication Management;Meal Prep;Occupation;Shop;Church    Stability/Clinical Decision Making Stable/Uncomplicated    Rehab Potential Good    PT Frequency 2x / week    PT Duration 8 weeks    PT Treatment/Interventions ADLs/Self Care Home Management;Cryotherapy;Moist Heat;Gait training;Stair training;Functional mobility training;Therapeutic activities;Therapeutic exercise;Balance training;Neuromuscular re-education;Patient/family education;Orthotic Fit/Training;Prosthetic Training;Manual techniques;Scar mobilization;Passive range of motion;Energy conservation;Joint Manipulations    PT Next Visit Plan [facility] appt 10/06/21.  continue gait training focused on increased ambulation distance. Static Standing Balance, Weight Shift Activities, improving upright posture.    PT Home Exercise Plan Access Code: [email or web address]  Consulted and Agree with Plan of Care Patient             Patient will benefit from skilled therapeutic intervention in order to improve the following deficits and impairments:  Abnormal gait, Decreased activity tolerance, Decreased balance, Decreased coordination, Decreased endurance, Decreased mobility, Decreased range of motion, Decreased safety awareness, Difficulty walking, Decreased strength, Increased fascial restricitons, Impaired flexibility, Improper body mechanics, Postural dysfunction, Prosthetic Dependency  Visit Diagnosis: Unsteadiness on feet  Muscle weakness (generalized)  Other abnormalities of gait and mobility  H[facility] of right BKA ([facility]    Problem List Patient Active Problem List    Diagnosis Date Noted   Diabetes mellitus type 2, uncontrolled, with complications 0476/72/0947 Diabetic nephropathy (HCPooler0409/62/8366 Chronic systolic CHF (congestive heart failure) (HCDelleker04/18/2022   Hypoglycemia 04/15/2021   Hyperglycemia 04/15/2021   DKA (diabetic ketoacidosis) (HCTrilby01/26/2022   COVID-19 virus infection 01/24/2021   Syncope 01/24/2021   ESRD (end stage renal disease) (HCRising Sun01/26/2022   Congestive heart failure (CHF) (HCV[facility]01/26/2022   Hypotension 01/24/2021   Shock (HCAnniston01/26/2022    KaKerrie PleasurePT 10/03/2021, 4:01 PM  [facility]  [place], [place], [address] Phone: [phone]   Fax:  [phone]  Name: Jose Robinson MRN: [id number] Date of Birth: [birth year]

## 2021-10-10 ENCOUNTER — Ambulatory Visit: Payer: Medicare (Managed Care)

## 2021-10-10 ENCOUNTER — Ambulatory Visit: Payer: Medicare (Managed Care) | Admitting: Occupational Therapy

## 2021-10-12 ENCOUNTER — Ambulatory Visit: Payer: Medicare (Managed Care)

## 2021-10-12 ENCOUNTER — Other Ambulatory Visit: Payer: Self-pay

## 2021-10-12 ENCOUNTER — Ambulatory Visit: Payer: Medicare (Managed Care) | Admitting: Occupational Therapy

## 2021-10-12 DIAGNOSIS — R2681 Unsteadiness on feet: Secondary | ICD-10-CM

## 2021-10-12 DIAGNOSIS — Z89511 Acquired absence of right leg below knee: Secondary | ICD-10-CM

## 2021-10-12 DIAGNOSIS — M25622 Stiffness of left elbow, not elsewhere classified: Secondary | ICD-10-CM | POA: Diagnosis not present

## 2021-10-12 DIAGNOSIS — M6281 Muscle weakness (generalized): Secondary | ICD-10-CM

## 2021-10-12 DIAGNOSIS — R2689 Other abnormalities of gait and mobility: Secondary | ICD-10-CM

## 2021-10-12 NOTE — Therapy (Signed)
[facility] [address] [place], [place], [address] Phone: [phone]   Fax:  [phone]  Physical Therapy Treatment  Patient Details  Name: Jose Robinson MRN: [id number] Date of Birth: 01-20-1956 Referring Provider (PT): Buzzy Han, MD   Encounter Date: 10/12/2021   PT End of Session - 10/12/21 1418     Visit Number 15    Number of Visits 25    Date for PT Re-Evaluation 10/21/21    Authorization Type [employer] Medicare/Medicaid (follow Medicare guidelines); Recert 1/76/16 to 07/37/10    Progress Note Due on Visit 19    PT Start Time 1400    PT Stop Time 1445    PT Time Calculation (min) 45 min    Equipment Utilized During Treatment Gait belt    Activity Tolerance Patient tolerated treatment well    Behavior During Therapy WFL for tasks assessed/performed             Past Medical History:  Diagnosis Date   Diabetes mellitus without complication ([place])    ESRD on hemodialysis ([place])    Hypertension     Past Surgical History:  Procedure Laterality Date   BACK SURGERY     BELOW KNEE LEG AMPUTATION      There were no vitals filed for this visit.   Subjective Assessment - 10/12/21 1418     Subjective I have some sort of rash developing on my both legs over the knees. I am not sure where that is coming from. It is painful rash. I have a call in with doctor.    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 no issues    How long can you stand comfortably? <30 sec with walker    How long can you walk comfortably? within his room    Patient Stated Goals Improve balance               1/4 sheet of tagaderm applied over R tibial tubercle where skin was blistering from rash with some redness present Gait training: 1 x 20', 1 x 30', 1 x 20', 1 x 30' with quad cane, CGA  to min A, tried cane in R hand but patient was more stable in L hand, cues required for 3 point gait and sequencing and increasing step length with L LE Fwd step up at first step at the stairs with bil UE assist: 2 x 5 R and L, pt using bil UE excessively                            PT Short Term Goals - 09/28/21 1700       PT SHORT TERM GOAL #1   Title Pt will demo 16/56 on BBS to decrease fall risk and improve walking endurance    Baseline 09/28/21: 18/56 scored today    Status Achieved      PT SHORT TERM GOAL #2   Title Pt will demo <38 sec with TUG wit RW to improve functional mobility    Baseline 36 sec with RW (07/18/21)    Status Achieved      PT SHORT TERM GOAL #3   Title Patient will be able to stand unassisted for 2 minutes to be able to perform safety with ADLs    Baseline 09/28/21: 1 minute, 8.37 sec's, improved  from < 10 sec's, just not to goal level    Status Partially Met               PT Long Term Goals - 08/24/21 1257       PT LONG TERM GOAL #1   Title Pt will demo at least 0.15 m/s improvement in his gait speed with RW to improve functional mobility    Baseline 0.72ms with RW (06/22/21); 0.470m with RW (08/24/21)    Time 8    Period Weeks    Status On-going    Target Date 10/19/21      PT LONG TERM GOAL #2   Title Pt will demo <35 sec with TUG with RW to improve functional mobility and decrease fall risk    Baseline 43 sec with RW (06/22/21); 37 sec with RW (08/24/21)    Time 8    Period Weeks    Status On-going    Target Date 10/19/21      PT LONG TERM GOAL #3   Title Pt will demo >19/56 on BBS to improve functional balance and reduce fall risk    Baseline 13/56 (06/22/21)    Time 8    Period Weeks    Status On-going    Target Date 10/19/21                   Plan - 10/12/21 1427     Clinical Impression Statement Pt demo . increased walking endurance with quad cane today compared to previsou session. Pt still  requires min A due to poor balance with walking an dinstability but patient is gradually getting more confidence.    Personal Factors and Comorbidities Age;Comorbidity 3+    Comorbidities ESRD on HD TTS, CAD, PAD, chronic systolic heart failure, COPD, insulin-dependent diabetes, history of right BKA    Examination-Activity Limitations Bathing;Bed Mobility;Carry;Dressing;Hygiene/Grooming;Lift;Self Feeding;Sleep;Squat;Stairs;Stand;Toileting;Transfers    Examination-Participation Restrictions Cleaning;Community Activity;Laundry;Medication Management;Meal Prep;Occupation;Shop;Church    Stability/Clinical Decision Making Stable/Uncomplicated    Rehab Potential Good    PT Frequency 2x / week    PT Duration 8 weeks    PT Treatment/Interventions ADLs/Self Care Home Management;Cryotherapy;Moist Heat;Gait training;Stair training;Functional mobility training;Therapeutic activities;Therapeutic exercise;Balance training;Neuromuscular re-education;Patient/family education;Orthotic Fit/Training;Prosthetic Training;Manual techniques;Scar mobilization;Passive range of motion;Energy conservation;Joint Manipulations    PT Next Visit Plan Continue to work on gait with quad cane, standing static and dynamic balance    PT Home Exercise Plan Access Code: [email or web address]  Consulted and Agree with Plan of Care Patient             Patient will benefit from skilled therapeutic intervention in order to improve the following deficits and impairments:  Abnormal gait, Decreased activity tolerance, Decreased balance, Decreased coordination, Decreased endurance, Decreased mobility, Decreased range of motion, Decreased safety awareness, Difficulty walking, Decreased strength, Increased fascial restricitons, Impaired flexibility, Improper body mechanics, Postural dysfunction, Prosthetic Dependency  Visit Diagnosis: Unsteadiness on feet  Muscle weakness (generalized)  Other abnormalities of gait and mobility  [facility]    Problem List Patient Active Problem List   Diagnosis Date Noted   Diabetes mellitus type 2, uncontrolled, with complications 0443/32/9518 Diabetic nephropathy ([facility]84/16/6063 Chronic systolic CHF (congestive heart failure) ([facility]04/18/2022   Hypoglycemia 04/15/2021   Hyperglycemia 04/15/2021   DKA (diabetic ketoacidosis) ([facility]01/26/2022   COVID-19 virus infection 01/24/2021   Syncope 01/24/2021   ESRD (end stage renal disease) ([facility]/26/2022   Congestive heart failure (CHF) ([facility]01/26/2022  Hypotension 01/24/2021   Shock ([place]) 01/24/2021    Kerrie Pleasure, PT 10/12/2021, 2:45 PM  [facility] [address] [place], [place], [address] Phone: [phone]   Fax:  [phone]  Name: Jose Robinson MRN: [id number] Date of Birth: Apr 23, 1956

## 2021-10-15 ENCOUNTER — Encounter: Payer: Self-pay | Admitting: Occupational Therapy

## 2021-10-15 ENCOUNTER — Ambulatory Visit: Payer: Medicare (Managed Care)

## 2021-10-15 ENCOUNTER — Ambulatory Visit: Payer: Medicare (Managed Care) | Admitting: Occupational Therapy

## 2021-10-15 ENCOUNTER — Other Ambulatory Visit: Payer: Self-pay

## 2021-10-15 DIAGNOSIS — R2681 Unsteadiness on feet: Secondary | ICD-10-CM

## 2021-10-15 DIAGNOSIS — M25622 Stiffness of left elbow, not elsewhere classified: Secondary | ICD-10-CM | POA: Diagnosis not present

## 2021-10-15 DIAGNOSIS — M25621 Stiffness of right elbow, not elsewhere classified: Secondary | ICD-10-CM

## 2021-10-15 DIAGNOSIS — M6281 Muscle weakness (generalized): Secondary | ICD-10-CM

## 2021-10-15 DIAGNOSIS — R2689 Other abnormalities of gait and mobility: Secondary | ICD-10-CM

## 2021-10-15 DIAGNOSIS — M25641 Stiffness of right hand, not elsewhere classified: Secondary | ICD-10-CM

## 2021-10-15 DIAGNOSIS — Z89511 Acquired absence of right leg below knee: Secondary | ICD-10-CM

## 2021-10-15 DIAGNOSIS — M25642 Stiffness of left hand, not elsewhere classified: Secondary | ICD-10-CM
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
## 2021-10-15 NOTE — Therapy (Signed)
[facility] [address] [place], [place], [address] Phone: [phone]   Fax:  [phone]  Physical Therapy Treatment  Patient Details  Name: Jose Robinson MRN: [id number] Date of Birth: 06/03/1956 Referring Provider (PT): Buzzy Han, MD   Encounter Date: 10/15/2021     Past Medical History:  Diagnosis Date   Diabetes mellitus without complication ([place])    ESRD on hemodialysis ([place])    Hypertension     Past Surgical History:  Procedure Laterality Date   BACK SURGERY     BELOW KNEE LEG AMPUTATION      There were no vitals filed for this visit.           Gait training: 3 x 20 feet with quad cane and min to mod A from PT STM to neck as patient was having pain with WB through arms with Fwd step ups: 10x R and L with bil UE support: 6" step                       PT Short Term Goals - 09/28/21 1700       PT SHORT TERM GOAL #1   Title Pt will demo 16/56 on BBS to decrease fall risk and improve walking endurance    Baseline 09/28/21: 18/56 scored today    Status Achieved      PT SHORT TERM GOAL #2   Title Pt will demo <38 sec with TUG wit RW to improve functional mobility    Baseline 36 sec with RW (07/18/21)    Status Achieved      PT SHORT TERM GOAL #3   Title Patient will be able to stand unassisted for 2 minutes to be able to perform safety with ADLs    Baseline 09/28/21: 1 minute, 8.37 sec's, improved from < 10 sec's, just not to goal level    Status Partially Met               PT Long Term Goals - 08/24/21 1257       PT LONG TERM GOAL #1   Title Pt will demo at least 0.15 m/s improvement in his gait speed with RW to improve functional mobility    Baseline 0.28ms with RW (06/22/21); 0.465m with RW (08/24/21)    Time 8    Period Weeks    Status On-going    Target Date 10/19/21      PT LONG TERM GOAL #2   Title Pt will demo <35 sec with TUG with  RW to improve functional mobility and decrease fall risk    Baseline 43 sec with RW (06/22/21); 37 sec with RW (08/24/21)    Time 8    Period Weeks    Status On-going    Target Date 10/19/21      PT LONG TERM GOAL #3   Title Pt will demo >19/56 on BBS to improve functional balance and reduce fall risk    Baseline 13/56 (06/22/21)    Time 8    Period Weeks    Status On-going    Target Date 10/19/21                     Patient will benefit from skilled therapeutic intervention in order to improve the following deficits and impairments:  Abnormal gait, Decreased activity tolerance, Decreased balance, Decreased coordination, Decreased endurance, Decreased mobility, Decreased range of motion, Decreased safety awareness, Difficulty walking, Decreased strength, Increased  fascial restricitons, Impaired flexibility, Improper body mechanics, Postural dysfunction, Prosthetic Dependency  Visit Diagnosis: Unsteadiness on feet  Muscle weakness (generalized)  Other abnormalities of gait and mobility  Hx of right BKA [facility])     Problem List Patient Active Problem List   Diagnosis Date Noted   Diabetes mellitus type 2, uncontrolled, with complications 94/32/7614   Diabetic nephropathy ([place]) 70/92/9574   Chronic systolic CHF (congestive heart failure) ([place]) 04/16/2021   Hypoglycemia 04/15/2021   Hyperglycemia 04/15/2021   DKA (diabetic ketoacidosis) ([place]) 01/24/2021   COVID-19 virus infection 01/24/2021   Syncope 01/24/2021   ESRD (end stage renal disease) ([place]) 01/24/2021   Congestive heart failure (CHF) ([place]) 01/24/2021   Hypotension 01/24/2021   Shock ([place]) 01/24/2021    Kerrie Pleasure, PT 10/17/2021, 8:50 AM  [facility] [address] [place], [place], [address] Phone: [phone]   Fax:  [phone]  Name: Jose Robinson MRN: [id number] Date of Birth: 04-17-1956

## 2021-10-17 ENCOUNTER — Ambulatory Visit: Payer: Medicare (Managed Care)

## 2021-10-17 ENCOUNTER — Ambulatory Visit: Payer: Medicare (Managed Care) | Admitting: Occupational Therapy

## 2021-10-18 ENCOUNTER — Emergency Department (HOSPITAL_COMMUNITY)
Admission: EM | Admit: 2021-10-18 | Discharge: 2021-10-19 | Disposition: A | Discharge status: Home / Self Care | Payer: Medicare (Managed Care) | Attending: Emergency Medicine | Admitting: Emergency Medicine

## 2021-10-18 ENCOUNTER — Other Ambulatory Visit: Payer: Self-pay

## 2021-10-18 ENCOUNTER — Encounter (HOSPITAL_COMMUNITY): Payer: Self-pay

## 2021-10-18 ENCOUNTER — Emergency Department (HOSPITAL_COMMUNITY): Payer: Medicare (Managed Care)

## 2021-10-18 DIAGNOSIS — Z7982 Long term (current) use of aspirin: Secondary | ICD-10-CM | POA: Diagnosis not present

## 2021-10-18 DIAGNOSIS — K625 Hemorrhage of anus and rectum: Secondary | ICD-10-CM | POA: Insufficient documentation

## 2021-10-18 DIAGNOSIS — E1122 Type 2 diabetes mellitus with diabetic chronic kidney disease: Secondary | ICD-10-CM | POA: Insufficient documentation

## 2021-10-18 DIAGNOSIS — F17210 Nicotine dependence, cigarettes, uncomplicated: Secondary | ICD-10-CM | POA: Diagnosis not present

## 2021-10-18 DIAGNOSIS — R7989 Other specified abnormal findings of blood chemistry: Secondary | ICD-10-CM | POA: Diagnosis not present

## 2021-10-18 DIAGNOSIS — I5022 Chronic systolic (congestive) heart failure: Secondary | ICD-10-CM | POA: Insufficient documentation

## 2021-10-18 DIAGNOSIS — I132 Hypertensive heart and chronic kidney disease with heart failure and with stage 5 chronic kidney disease, or end stage renal disease: Secondary | ICD-10-CM | POA: Insufficient documentation

## 2021-10-18 DIAGNOSIS — Z992 Dependence on renal dialysis: Secondary | ICD-10-CM | POA: Insufficient documentation

## 2021-10-18 DIAGNOSIS — Z794 Long term (current) use of insulin: Secondary | ICD-10-CM | POA: Insufficient documentation

## 2021-10-18 DIAGNOSIS — R109 Unspecified abdominal pain: Secondary | ICD-10-CM | POA: Diagnosis not present

## 2021-10-18 DIAGNOSIS — Z79899 Other long term (current) drug therapy: Secondary | ICD-10-CM | POA: Insufficient documentation

## 2021-10-18 DIAGNOSIS — N186 End stage renal disease: Secondary | ICD-10-CM | POA: Diagnosis not present

## 2021-10-18 LAB — CBC WITH DIFFERENTIAL/PLATELET
Abs Immature Granulocytes: 0.06 10*3/uL (ref 0.00–0.07)
Basophils Absolute: 0 10*3/uL (ref 0.0–0.1)
Basophils Relative: 0 %
Eosinophils Absolute: 0 10*3/uL (ref 0.0–0.5)
Eosinophils Relative: 0 %
HCT: 51.5 % (ref 39.0–52.0)
Hemoglobin: 15.8 g/dL (ref 13.0–17.0)
Immature Granulocytes: 1 %
Lymphocytes Relative: 6 %
Lymphs Abs: 0.6 10*3/uL — ABNORMAL LOW (ref 0.7–4.0)
MCH: 27.8 pg (ref 26.0–34.0)
MCHC: 30.7 g/dL (ref 30.0–36.0)
MCV: 90.7 fL (ref 80.0–100.0)
Monocytes Absolute: 0.8 10*3/uL (ref 0.1–1.0)
Monocytes Relative: 9 %
Neutro Abs: 7.2 10*3/uL (ref 1.7–7.7)
Neutrophils Relative %: 84 %
Platelets: 184 10*3/uL (ref 150–400)
RBC: 5.68 MIL/uL (ref 4.22–5.81)
RDW: 15.9 % — ABNORMAL HIGH (ref 11.5–15.5)
WBC: 8.7 10*3/uL (ref 4.0–10.5)
nRBC: 0.5 % — ABNORMAL HIGH (ref 0.0–0.2)

## 2021-10-18 LAB — TYPE AND SCREEN
ABO/RH(D): O POS
Antibody Screen: NEGATIVE

## 2021-10-18 LAB — COMPREHENSIVE METABOLIC PANEL
ALT: 20 U/L (ref 0–44)
AST: 27 U/L (ref 15–41)
Albumin: 3.7 g/dL (ref 3.5–5.0)
Alkaline Phosphatase: 74 U/L (ref 38–126)
Anion gap: 14 (ref 5–15)
BUN: 18 mg/dL (ref 8–23)
CO2: 24 mmol/L (ref 22–32)
Calcium: 8.9 mg/dL (ref 8.9–10.3)
Chloride: 95 mmol/L — ABNORMAL LOW (ref 98–111)
Creatinine, Ser: 5.61 mg/dL — ABNORMAL HIGH (ref 0.61–1.24)
GFR, Estimated: 11 mL/min — ABNORMAL LOW (ref >60)
Glucose, Bld: 126 mg/dL — ABNORMAL HIGH (ref 70–99)
Potassium: 5.1 mmol/L (ref 3.5–5.1)
Sodium: 133 mmol/L — ABNORMAL LOW (ref 135–145)
Total Bilirubin: 1.7 mg/dL — ABNORMAL HIGH (ref 0.3–1.2)
Total Protein: 8.2 g/dL — ABNORMAL HIGH (ref 6.5–8.1)

## 2021-10-18 LAB — POC OCCULT BLOOD, ED: Fecal Occult Bld: POSITIVE — AB

## 2021-10-18 NOTE — Discharge Instructions (Signed)
Your exam today did show some blood in your rectum, you had a CT scan that showed you may have a little bit of irritation in that area but there is no concern for any abscess or other concerning issues at this time.  Your lab work was unremarkable.  If you start having more significant bleeding and begin to feel more fatigued than usual, pale, weak then please follow-up for further evaluation.

## 2021-10-18 NOTE — ED Notes (Signed)
Patient transported to CT 

## 2021-10-18 NOTE — ED Notes (Signed)
Pt refusing to allow RN to attempt IV

## 2021-10-18 NOTE — ED Provider Notes (Signed)
[facility] DEPT Provider Note   CSN: [id number] Arrival date & time: 10/18/21  2003     History Chief Complaint  Patient presents with   Rectal Bleeding    Jose Robinson is a 66 y.o. male presenting for rectal bleeding.  He reports the bleeding started this morning and he noticed it more so when he was wiping his bottom.  He notes that he had a little bit of pain at that time.  Otherwise he has not had any symptoms, he reports no lightheadedness vomiting, abdominal pain.  He has not had this issue before but is not very concerned at this time.   Rectal Bleeding Associated symptoms: no abdominal pain, no dizziness, no fever, no light-headedness and no vomiting       Past Medical History:  Diagnosis Date   Diabetes mellitus without complication ([place] [facility])    ESRD on hemodialysis ([place])    Hypertension     Patient Active Problem List   Diagnosis Date Noted   Diabetes mellitus type 2, uncontrolled, with complications 71/05/2693   Diabetic nephropathy ([place]) 85/46/2703   Chronic systolic CHF (congestive heart failure) ([place]) 04/16/2021   Hypoglycemia 04/15/2021   Hyperglycemia 04/15/2021   DKA (diabetic ketoacidosis) ([place]) 01/24/2021   COVID-19 virus infection 01/24/2021   Syncope 01/24/2021   ESRD (end stage renal disease) ([place]) 01/24/2021   Congestive heart failure (CHF) ([place]) 01/24/2021   Hypotension 01/24/2021   Shock ([place]) 01/24/2021    Past Surgical History:  Procedure Laterality Date   BACK SURGERY     BELOW KNEE LEG AMPUTATION         Family History  Family history unknown: Yes    Social History   Tobacco Use   Smoking status: Every Day    Packs/day: 0.50    Types: Cigarettes   Smokeless tobacco: Never  Substance Use Topics   Alcohol use: Not Currently   Drug use: Not Currently    Home Medications Prior to Admission medications   Medication Sig Start Date End Date Taking? Authorizing Provider  aspirin 81 MG chewable  tablet Chew 1 tablet (81 mg total) by mouth daily. 02/10/21   Charlynne Cousins, MD  atorvastatin (LIPITOR) 20 MG tablet Take 1 tablet (20 mg total) by mouth daily. 02/10/21   Charlynne Cousins, MD  blood glucose meter kit and supplies KIT Dispense based on patient and insurance preference. Use up to four times daily as directed. (FOR ICD-9 250.00, 250.01). Patient taking differently: Inject 1 each into the skin See admin instructions. Dispense based on patient and insurance preference. Use up to four times daily as directed. (FOR ICD-9 250.00, 250.01). 01/30/21   Donne Hazel, MD  brimonidine [facility]) 0.2 % ophthalmic solution Place 1 drop into both eyes in the morning and at bedtime.    [provider]  calcitRIOL (ROCALTROL) 0.5 MCG capsule Take 2 capsules (1 mcg total) by mouth Every Tuesday,Thursday,and Saturday with dialysis. 04/17/21   [name], MD  calcium acetate (PHOSLO) 667 MG capsule Take 2 capsules (1,334 mg total) by mouth 3 (three) times daily with meals. 04/17/21   [name], MD  calcium elemental as carbonate (CALCIUM ANTACID ULTRA STRENGTH) 400 MG chewable tablet Chew 2,000 mg by mouth daily.    [provider]  donepezil (ARICEPT) 10 MG tablet Take 10 mg by mouth daily. 12/30/20   [provider]  Dulaglutide (TRULICITY) 1.5 JK/0.9FG SOPN Inject 1.5 mg into the skin every Thursday.  [provider]  famotidine (PEPCID) 20 MG tablet Take 0.5 tablets (10 mg total) by mouth daily. 08/01/21   Tedd Sias, PA  gabapentin (NEURONTIN) 100 MG capsule Take 200 mg by mouth 3 (three) times daily. 12/30/20   [provider]  glucose 4 GM chewable tablet Chew 2 tablets (8 g total) by mouth 4 (four) times daily as needed for low blood sugar (for CBG<80). 04/17/21   [name], MD  guaiFENesin-dextromethorphan (ROBITUSSIN DM) 100-10 MG/5ML syrup Take 10 mLs by mouth every 4 (four) hours as needed for cough (chest congestion).  04/17/21   [name], MD  insulin aspart (NOVOLOG FLEXPEN) 100 UNIT/ML FlexPen Inject 3 Units into the skin 3 (three) times daily with meals as needed for high blood sugar (CBG>180). Patient taking differently: Inject 1-8 Units into the skin See admin instructions. Check FSBS before each meal at bedtime and inject per SSI: 0-150 = 0 units  150-200 = 1 unit  201 - 250 = 2 units  251 - 250 = 3 units  350 - 400 = 5 units  Greater than 400 = 8 units (Prime pen with 2 units prior to each use). 04/17/21   [name], MD  Insulin Glargine [facility]) 100 UNIT/ML Inject 8 Units into the skin at bedtime.    [provider]  latanoprost (XALATAN) 0.005 % ophthalmic solution Place 1 drop into both eyes at bedtime.    [provider]  lidocaine (LIDODERM) 5 % Place 1 patch onto the skin daily. Remove & Discard patch within 12 hours or as directed by MD Patient not taking: Reported on 08/01/2021 04/17/21   [name], MD  loperamide (IMODIUM A-D) 2 MG tablet Take 2-4 mg by mouth See admin instructions. 4 mg after 1st loose stool, then 2 mg after each loose stool. Max 4 tabs in 24 hours    [provider]  loratadine (CLARITIN) 10 MG tablet Take 10 mg by mouth daily.    [provider]  midodrine (PROAMATINE) 5 MG tablet Take 1 tablet (5 mg total) by mouth every Tuesday, Thursday, Saturday, and Sunday. 04/17/21   [name], MD  naproxen sodium (ALEVE) 220 MG tablet Take 440 mg by mouth 2 (two) times daily as needed (pain/headache).    [provider]  nystatin ointment (MYCOSTATIN) Apply 1 application topically daily.    [provider]  ondansetron (ZOFRAN ODT) 4 MG disintegrating tablet Take 1 tablet (4 mg total) by mouth every 8 (eight) hours as needed for nausea or vomiting. 08/01/21   [place], Kathleene Hazel, PA  ondansetron (ZOFRAN) 4 MG tablet Take 4 mg by mouth every 8 (eight) hours as needed for nausea or vomiting. 02/02/18   [provider]  Pancrelipase, Lip-Prot-Amyl, (ZENPEP) 5000-24000 units CPEP Take 1 capsule by mouth 3 (three) times daily with meals.    [provider]  pantoprazole (PROTONIX) 40 MG tablet Take 1 tablet (40 mg total) by mouth daily. 04/17/21   [name], MD    Allergies    Acetaminophen, Prednisone, and Ivp dye [iodinated diagnostic agents]  Review of Systems   Review of Systems  Constitutional:  Negative for activity change, chills, diaphoresis, fatigue and fever.  HENT:  Negative for congestion and trouble swallowing.   Eyes:  Negative for visual disturbance.  Respiratory:  Negative for cough, chest tightness and shortness of breath.   Cardiovascular:  Negative for chest pain, palpitations and leg swelling.  Gastrointestinal:  Positive for blood in stool and hematochezia. Negative for abdominal distention, abdominal pain, constipation, diarrhea, nausea and vomiting.  Genitourinary:        Patient on dialysis and reports he does not make urine  Musculoskeletal:  Negative for arthralgias.  Skin:  Negative for pallor.  Neurological:  Negative for dizziness, weakness and light-headedness.   Physical Exam Updated Vital Signs BP (!) 111/98   Pulse 86   Temp 98.6 F (37 C) (Oral)   Resp 18   Ht 5' 11" (1.803 m)   Wt 66.2 kg   SpO2 93%   BMI 20.36 kg/m   Physical Exam Constitutional:      Appearance: Normal appearance. He is normal weight.     Comments: Appears tired (though denies it) and responds readily  HENT:     Head: Normocephalic and atraumatic.     Right Ear: External ear normal.     Left Ear: External ear normal.     Nose: Nose normal.     Mouth/Throat:     Mouth: Mucous membranes are moist.     Pharynx: Oropharynx is clear.  Eyes:     Extraocular Movements: Extraocular movements intact.     Conjunctiva/sclera: Conjunctivae normal.     Pupils: Pupils are equal, round, and reactive to light.  Cardiovascular:     Rate and Rhythm: Normal rate and  regular rhythm.     Pulses: Normal pulses.     Heart sounds: Normal heart sounds.  Pulmonary:     Effort: Pulmonary effort is normal.     Breath sounds: Normal breath sounds.  Abdominal:     General: Abdomen is flat. Bowel sounds are normal.     Palpations: Abdomen is soft.     Tenderness: There is no abdominal tenderness. There is no guarding or rebound.  Genitourinary:    Comments: Blood present at rectum Musculoskeletal:        General: Normal range of motion.     Cervical back: Normal range of motion and neck supple.     Comments: Right BKA with prosthesis  Skin:    General: Skin is warm and dry.     Capillary Refill: Capillary refill takes less than 2 seconds.  Neurological:     Mental Status: Mental status is at baseline.     Motor: No weakness.    ED Results / Procedures / Treatments   Labs (all labs ordered are listed, but only abnormal results are displayed) Labs Reviewed  COMPREHENSIVE METABOLIC PANEL - Abnormal; Notable for the following components:      Result Value   Sodium 133 (*)    Chloride 95 (*)    Glucose, Bld 126 (*)    Creatinine, Ser 5.61 (*)    Total Protein 8.2 (*)    Total Bilirubin 1.7 (*)    GFR, Estimated 11 (*)    All other components within normal limits  CBC WITH DIFFERENTIAL/PLATELET - Abnormal; Notable for the following components:   RDW 15.9 (*)    nRBC 0.5 (*)    Lymphs Abs 0.6 (*)    All other components within normal limits  POC OCCULT BLOOD, ED - Abnormal; Notable for the following components:   Fecal Occult Bld POSITIVE (*)    All other components within normal limits  OCCULT BLOOD X 1 CARD TO LAB, STOOL  TYPE AND SCREEN  ABO/RH    EKG EKG Interpretation  Date/Time:  Thursday October 18 2021 21:27:31 EDT Ventricular Rate:  83 PR Interval:  192 QRS Duration: 138 QT Interval:  385 QTC Calculation: 453 R Axis:   -53 Text Interpretation: Sinus rhythm Nonspecific IVCD with LAD LVH with secondary repolarization abnormality  Anterior Q waves, possibly due to LVH No significant change since last tracing Confirmed by Lacretia Leigh ([id number]) on 10/18/2021 10:41:05 PM  Radiology CT Abdomen Pelvis Wo Contrast  Result Date: 10/18/2021 CLINICAL DATA:  Left-sided abdominal pain and bloody stools, initial encounter EXAM: CT ABDOMEN AND PELVIS WITHOUT CONTRAST TECHNIQUE: Multidetector CT imaging of the abdomen and pelvis was performed following the standard protocol without IV contrast. COMPARISON:  08/01/2021 FINDINGS: Lower chest: No acute abnormality. Hepatobiliary: Liver is well visualized within normal limits. The gallbladder is not well appreciated and may be decompressed. Correlate with clinical history. Pancreas: Unremarkable. No pancreatic ductal dilatation or surrounding inflammatory changes. Spleen: Normal in size without focal abnormality. Adrenals/Urinary Tract: Adrenal glands are within normal limits. Kidneys demonstrate renal vascular calcifications. No renal calculi or obstructive changes are seen. The bladder is partially distended. Stomach/Bowel: No significant diverticular disease is noted. Proximal colon appears within normal limits. The rectum demonstrates some circumferential wall thickening likely related to focal proctitis. Mild inflammatory changes are noted in the perirectal fat. No focal abscess is seen. Vascular/Lymphatic: Aortic atherosclerosis. No enlarged abdominal or pelvic lymph nodes. Reproductive: Prostate is unremarkable. Reservoir is noted in the left hemipelvis consistent with penile prosthesis. This is stable in appearance. Other: No abdominal wall hernia or abnormality. No abdominopelvic ascites. Musculoskeletal: Degenerative changes of the hip joints and lumbar spine are noted. Postsurgical changes are noted in the lower lumbar spine. IMPRESSION: Gallbladder is not well visualized and may have been surgically removed. Correlation with the clinical history is recommended. 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 Perirectal inflammatory changes are noted as well. No abscess is seen. Chronic changes as described. Electronically Signed   By: Inez Catalina M.D.   On: 10/18/2021 21:44    Procedures Procedures   Medications Ordered in ED Medications - No data to display  ED Course  I have reviewed the triage vital signs and the nursing notes.  Pertinent labs & imaging results that were available during my care of the patient were reviewed by me and considered in my medical decision making (see chart for details).    MDM Rules/Calculators/A&P  Jose Robinson is a 65 y.o. male presenting with rectal bleeding that started today.  PMH significant for T2DM, ESRD on dialysis, CHF.  Patient reports that he currently feels okay, the bleeding was present upon wiping. FOBT positive and on exam blood is mixed with the stool. Will obtain basic labs including CBC, CMP, and type and cross in case of need for transfusion. Will obtain a CT abdomen WO contrast to evaluate for possible diverticulosis.  CBC was within normal limits, CMP with elevated creatinine and other labs, though consistent with ESRD and are not concerning at this time.  Patient did receive his dialysis session today in its entirety. CT scan showed compression wall thickening in the rectum, likely focal proctitis. Perirectal inflammatory changes without abscess.  At this time, no concerning anemia, diverticulosis findings, or abscess present.  Do not feel that patient needs further work-up at this time.  Discussed with patient return precautions if continuing or increasing bleeding with associated anemia symptoms.  Final Clinical Impression(s) / ED Diagnoses Final diagnoses:  Rectal bleeding     Rise Patience, DO 10/18/21 2259    Lacretia Leigh, MD  10/22/21 1014

## 2021-10-18 NOTE — ED Provider Notes (Signed)
I saw and evaluated the patient, reviewed the resident's note and I agree with the findings and plan.  65 year old male presents with blood in stool x1 day.  On digital rectal exam today he did have blood mixed with his stool.  Will perform labs and abdominal CT.   Lacretia Leigh, MD 10/18/21 [id number]

## 2021-10-18 NOTE — ED Notes (Signed)
Patients daughter would like a call with an update: [place] [phone]

## 2021-10-18 NOTE — ED Notes (Signed)
Requested several times that pt sit up with my help so he could be changed into a gown. Pt will not help at all and falls asleep repeatedly.

## 2021-10-18 NOTE — ED Triage Notes (Signed)
Patient BIB [facility] from [place] for possible blood in stool. Patient does not c/o pain. Per EMS patient is at his baseline mentation. Patient did have a dialysis treatment today

## 2021-10-19 NOTE — ED Notes (Signed)
Patient repositioned

## 2021-10-24 ENCOUNTER — Emergency Department (HOSPITAL_COMMUNITY)
Admission: EM | Admit: 2021-10-24 | Discharge: 2021-10-25 | Disposition: A | Discharge status: Home / Self Care | Payer: Medicare (Managed Care) | Attending: Emergency Medicine | Admitting: Emergency Medicine

## 2021-10-24 ENCOUNTER — Ambulatory Visit: Payer: Medicare (Managed Care) | Admitting: Occupational Therapy

## 2021-10-24 ENCOUNTER — Encounter (HOSPITAL_COMMUNITY): Payer: Self-pay | Admitting: Emergency Medicine

## 2021-10-24 ENCOUNTER — Emergency Department (HOSPITAL_COMMUNITY): Payer: Medicare (Managed Care)

## 2021-10-24 ENCOUNTER — Ambulatory Visit: Payer: Medicare (Managed Care)

## 2021-10-24 DIAGNOSIS — M7042 Prepatellar bursitis, left knee: Secondary | ICD-10-CM

## 2021-10-24 DIAGNOSIS — E1122 Type 2 diabetes mellitus with diabetic chronic kidney disease: Secondary | ICD-10-CM | POA: Insufficient documentation

## 2021-10-24 DIAGNOSIS — Z7982 Long term (current) use of aspirin: Secondary | ICD-10-CM | POA: Diagnosis not present

## 2021-10-24 DIAGNOSIS — Z8616 Personal history of COVID-19: Secondary | ICD-10-CM | POA: Diagnosis not present

## 2021-10-24 DIAGNOSIS — E11649 Type 2 diabetes mellitus with hypoglycemia without coma: Secondary | ICD-10-CM | POA: Insufficient documentation

## 2021-10-24 DIAGNOSIS — I5022 Chronic systolic (congestive) heart failure: Secondary | ICD-10-CM | POA: Insufficient documentation

## 2021-10-24 DIAGNOSIS — I132 Hypertensive heart and chronic kidney disease with heart failure and with stage 5 chronic kidney disease, or end stage renal disease: Secondary | ICD-10-CM | POA: Diagnosis not present

## 2021-10-24 DIAGNOSIS — Z794 Long term (current) use of insulin: Secondary | ICD-10-CM | POA: Diagnosis not present

## 2021-10-24 DIAGNOSIS — N186 End stage renal disease: Secondary | ICD-10-CM | POA: Insufficient documentation

## 2021-10-24 DIAGNOSIS — Z79899 Other long term (current) drug therapy: Secondary | ICD-10-CM | POA: Insufficient documentation

## 2021-10-24 DIAGNOSIS — Y9389 Activity, other specified: Secondary | ICD-10-CM | POA: Insufficient documentation

## 2021-10-24 DIAGNOSIS — F17210 Nicotine dependence, cigarettes, uncomplicated: Secondary | ICD-10-CM | POA: Diagnosis not present

## 2021-10-24 DIAGNOSIS — M25561 Pain in right knee: Secondary | ICD-10-CM | POA: Diagnosis present

## 2021-10-24 DIAGNOSIS — M7041 Prepatellar bursitis, right knee: Secondary | ICD-10-CM | POA: Insufficient documentation

## 2021-10-24 DIAGNOSIS — E1140 Type 2 diabetes mellitus with diabetic neuropathy, unspecified: Secondary | ICD-10-CM | POA: Diagnosis not present

## 2021-10-24 MED ORDER — DOXYCYCLINE HYCLATE 100 MG PO CAPS: 100.0000 mg | ORAL_CAPSULE | Freq: Two times a day (BID) | ORAL | 0 refills | Status: DC

## 2021-10-24 MED ORDER — OXYCODONE HCL 5 MG PO TABS: 5.0000 mg | ORAL_TABLET | Freq: Four times a day (QID) | ORAL | 0 refills | Status: AC | PRN

## 2021-10-24 MED ORDER — MUPIROCIN CALCIUM 2 % EX CREA: 1.0000 "application " | TOPICAL_CREAM | Freq: Two times a day (BID) | CUTANEOUS | 0 refills | Status: DC

## 2021-10-24 MED ORDER — OXYCODONE HCL 5 MG PO TABS: 5.0000 mg | ORAL_TABLET | Freq: Four times a day (QID) | ORAL | 0 refills | Status: DC | PRN

## 2021-10-24 MED ORDER — OXYCODONE HCL 5 MG PO TABS
15.0000 mg | ORAL_TABLET | Freq: Once | ORAL | Status: AC
  Administered 2021-10-24: 15 mg via ORAL
  Filled 2021-10-24: qty 3

## 2021-10-24 MED ORDER — MUPIROCIN CALCIUM 2 % EX CREA
TOPICAL_CREAM | Freq: Once | CUTANEOUS | Status: AC
  Filled 2021-10-24: qty 15

## 2021-10-24 NOTE — ED Triage Notes (Signed)
Pt arrives via EMS from [facility] with reports of rash of a week to left knee, left lower shin and right shin under his prosthetic. Pt denies any injury.

## 2021-10-24 NOTE — Discharge Instructions (Addendum)
Your examination today is consistent with a prepatellar bursitis.  This is similar to what you have had in the past.  Information about this condition is attached to your discharge papers.  I am starting you on some antibiotics that I have sent to the pharmacy.  There is also printed prescription for you to utilize as you see fit.  Pain medication has also been sent to the pharmacy, I am unable to write a handwritten prescription for narcotic pain medication.  If you are unable to pick it up you may also utilize over-the-counter ibuprofen and acetaminophen for your pain.  There is an orthopedic office attached to your discharge papers.  It is important that you follow-up with them about your symptoms.  Also, speak with your primary care provider about your visit today and try to get and for an appointment for continued care.  It was a pleasure to meet you today and I hope that you feel better.

## 2021-10-24 NOTE — ED Notes (Signed)
Dressing applied with Bactroban, non-stick guaze, guaze, kling and tape to bilateral knees

## 2021-10-24 NOTE — ED Notes (Signed)
Called ptar for pt #7 on the list

## 2021-10-24 NOTE — ED Provider Notes (Signed)
[facility] EMERGENCY DEPARTMENT Provider Note   CSN: [id number] Arrival date & time: 10/24/21  1737     History Chief Complaint  Patient presents with   Rash    Jose Robinson is a 65 y.o. male with a past medical history of hypertension, end-stage renal disease on Tuesday, Thursday and Saturday dialysis, and diabetes presenting today with a complaint of bilateral lower extremity discomfort.  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  After this procedure he was in a skilled nursing facility in [place].  Wound care nurses in this facility helped keep his wound clean as well as monitor to other wounds on his left lower extremity.  He reports that about a month ago these wound care providers stopped coming to his facility and he has been unable to dress his wounds.  Believes his right knee discomfort is due to poor padding of his right lower extremity.  Past Medical History:  Diagnosis Date   Diabetes mellitus without complication ([place])    ESRD on hemodialysis ([place])    Hypertension     Patient Active Problem List   Diagnosis Date Noted   Diabetes mellitus type 2, uncontrolled, with complications 03/49/1791   Diabetic nephropathy ([place]) 50/56/9794   Chronic systolic CHF (congestive heart failure) ([place]) 04/16/2021   Hypoglycemia 04/15/2021   Hyperglycemia 04/15/2021   DKA (diabetic ketoacidosis) ([place]) 01/24/2021   COVID-19 virus infection 01/24/2021   Syncope 01/24/2021   ESRD (end stage renal disease) ([place]) 01/24/2021   Congestive heart failure (CHF) ([place]) 01/24/2021   Hypotension 01/24/2021   Shock ([place]) 01/24/2021    Past Surgical History:  Procedure Laterality Date   BACK SURGERY     BELOW KNEE LEG AMPUTATION         Family History  Family history unknown: Yes    Social History   Tobacco Use   Smoking status: Every Day    Packs/day: 0.50    Types: Cigarettes   Smokeless tobacco: Never  Substance Use Topics   Alcohol use: Not  Currently   Drug use: Not Currently    Home Medications Prior to Admission medications   Medication Sig Start Date End Date Taking? Authorizing Provider  aspirin 81 MG chewable tablet Chew 1 tablet (81 mg total) by mouth daily. 02/10/21   Charlynne Cousins, MD  atorvastatin (LIPITOR) 20 MG tablet Take 1 tablet (20 mg total) by mouth daily. 02/10/21   Charlynne Cousins, MD  blood glucose meter kit and supplies KIT Dispense based on patient and insurance preference. Use up to four times daily as directed. (FOR ICD-9 250.00, 250.01). Patient taking differently: Inject 1 each into the skin See admin instructions. Dispense based on patient and insurance preference. Use up to four times daily as directed. (FOR ICD-9 250.00, 250.01). 01/30/21   Donne Hazel, MD  brimonidine [facility]) 0.2 % ophthalmic solution Place 1 drop into both eyes in the morning and at bedtime.    [provider]  calcitRIOL (ROCALTROL) 0.5 MCG capsule Take 2 capsules (1 mcg total) by mouth Every Tuesday,Thursday,and Saturday with dialysis. 04/17/21   [name], MD  calcium acetate (PHOSLO) 667 MG capsule Take 2 capsules (1,334 mg total) by mouth 3 (three) times daily with meals. 04/17/21   [name], MD  calcium elemental as carbonate (CALCIUM ANTACID ULTRA STRENGTH) 400 MG chewable tablet Chew 2,000 mg by mouth daily.    [provider]  donepezil (ARICEPT) 10 MG tablet Take  10 mg by mouth daily. 12/30/20   [provider]  Dulaglutide (TRULICITY) 1.5 DT/2.6ZT SOPN Inject 1.5 mg into the skin every Thursday.    [provider]  famotidine (PEPCID) 20 MG tablet Take 0.5 tablets (10 mg total) by mouth daily. 08/01/21   Tedd Sias, PA  gabapentin (NEURONTIN) 100 MG capsule Take 200 mg by mouth 3 (three) times daily. 12/30/20   [provider]  glucose 4 GM chewable tablet Chew 2 tablets (8 g total) by mouth 4 (four) times daily as needed for low blood sugar (for CBG<80).  04/17/21   [name], MD  guaiFENesin-dextromethorphan (ROBITUSSIN DM) 100-10 MG/5ML syrup Take 10 mLs by mouth every 4 (four) hours as needed for cough (chest congestion). 04/17/21   [name], MD  insulin aspart (NOVOLOG FLEXPEN) 100 UNIT/ML FlexPen Inject 3 Units into the skin 3 (three) times daily with meals as needed for high blood sugar (CBG>180). Patient taking differently: Inject 1-8 Units into the skin See admin instructions. Check FSBS before each meal at bedtime and inject per SSI: 0-150 = 0 units  150-200 = 1 unit  201 - 250 = 2 units  251 - 250 = 3 units  350 - 400 = 5 units  Greater than 400 = 8 units (Prime pen with 2 units prior to each use). 04/17/21   [name], MD  Insulin Glargine [place]) 100 UNIT/ML Inject 8 Units into the skin at bedtime.    [provider]  latanoprost (XALATAN) 0.005 % ophthalmic solution Place 1 drop into both eyes at bedtime.    [provider]  lidocaine (LIDODERM) 5 % Place 1 patch onto the skin daily. Remove & Discard patch within 12 hours or as directed by MD Patient not taking: Reported on 08/01/2021 04/17/21   [name], MD  loperamide (IMODIUM A-D) 2 MG tablet Take 2-4 mg by mouth See admin instructions. 4 mg after 1st loose stool, then 2 mg after each loose stool. Max 4 tabs in 24 hours    [provider]  loratadine (CLARITIN) 10 MG tablet Take 10 mg by mouth daily.    [provider]  midodrine (PROAMATINE) 5 MG tablet Take 1 tablet (5 mg total) by mouth every Tuesday, Thursday, Saturday, and Sunday. 04/17/21   [name], MD  naproxen sodium (ALEVE) 220 MG tablet Take 440 mg by mouth 2 (two) times daily as needed (pain/headache).    [provider]  nystatin ointment (MYCOSTATIN) Apply 1 application topically daily.    [provider]  ondansetron (ZOFRAN ODT) 4 MG disintegrating tablet Take 1 tablet (4 mg total) by mouth every 8 (eight) hours as needed for  nausea or vomiting. 08/01/21   [place], Kathleene Hazel, PA  ondansetron (ZOFRAN) 4 MG tablet Take 4 mg by mouth every 8 (eight) hours as needed for nausea or vomiting. 02/02/18   [provider]  Pancrelipase, Lip-Prot-Amyl, (ZENPEP) 5000-24000 units CPEP Take 1 capsule by mouth 3 (three) times daily with meals.    [provider]  pantoprazole (PROTONIX) 40 MG tablet Take 1 tablet (40 mg total) by mouth daily. 04/17/21   [name], MD    Allergies    Acetaminophen, Prednisone, and Ivp dye [iodinated diagnostic agents]  Review of Systems   Review of Systems  Constitutional:  Negative for chills and fever.  Musculoskeletal:  Positive for gait problem and joint swelling.  Skin:  Positive for rash and  wound.  Neurological:  Negative for numbness.  All other systems reviewed and are negative.  Physical Exam Updated Vital Signs BP 102/66 (BP Location: Right Arm)   Pulse 85   Temp 98.3 F (36.8 C) (Oral)   Resp 18   SpO2 97%   Physical Exam Vitals and nursing note reviewed.  Constitutional:      Appearance: Normal appearance.  HENT:     Head: Normocephalic and atraumatic.  Eyes:     General: No scleral icterus.    Conjunctiva/sclera: Conjunctivae normal.  Pulmonary:     Effort: Pulmonary effort is normal. No respiratory distress.  Musculoskeletal:        General: Tenderness and signs of injury present. No swelling. Normal range of motion.     Comments: Patient was with full range of motion and bilateral knees.  Able to flex and extend his legs.  Left knee with abrasions and surrounding erythema.  Very tender to the touch.  No indication of effusion.  Right-sided BKA well-healed.  Skin:    General: Skin is warm and dry.     Findings: Lesion present. No bruising or rash.     Comments: Photos in patient's chart  Neurological:     Mental Status: He is alert.     Sensory: No sensory deficit.     Motor: No weakness.  Psychiatric:        Mood and Affect: Mood normal.         Behavior: Behavior normal.    ED Results / Procedures / Treatments   Labs (all labs ordered are listed, but only abnormal results are displayed) Labs Reviewed - No data to display  EKG None  Radiology DG Knee Complete 4 Views Left  Result Date: 10/24/2021 CLINICAL DATA:  Leg pain EXAM: LEFT KNEE - COMPLETE 4+ VIEW COMPARISON:  None. FINDINGS: No evidence of fracture, dislocation, or joint effusion. No evidence of arthropathy or other focal bone abnormality. Soft tissues are unremarkable. Vascular calcifications. Slightly heterogeneous mineralization of the fibula possibly due to osteopenia. IMPRESSION: Negative. Electronically Signed   By: Donavan Foil M.D.   On: 10/24/2021 19:47    Procedures Procedures   Medications Ordered in ED Medications  oxyCODONE (Oxy IR/ROXICODONE) immediate release tablet 15 mg (has no administration in time range)    ED Course  I have reviewed the triage vital signs and the nursing notes.  Pertinent labs & imaging results that were available during my care of the patient were reviewed by me and considered in my medical decision making (see chart for details).    MDM Rules/Calculators/A&P   Mr. Slaven is a 65 year old male with a history of BKA who presented due to lower extremity pain.  He was fully evaluated by me and also seen by Dr. Vallery Ridge.  He was with extreme pain with light touch of his left knee.  He was treated with oxycodone which improved his pain significantly.  Initially the chief complaint was a rash to the bilateral lower extremities.  Upon physical exam, these areas are more consistent with skin breakdown/abrasions.  X-ray was nonrevealing for intra-articular abnormality. Exam more consistent with a prepatellar bursitis. Patient will be discharged back to his facility with pain medication, antibiotics, supplies for wound dressing as well as a referral to orthopedics.  He reports that he has gotten established with a primary care  provider with whom he will also follow-up with.  Medications have been sent to pharmacy.  Written prescriptions were also provided for nonnarcotic  medications.  He is stable for discharge at this time.  Information about his diagnosis attached to his discharge papers.   Final Clinical Impression(s) / ED Diagnoses Final diagnoses:  Prepatellar bursitis of right knee    Rx / DC Orders Results and diagnoses were explained to the patient. Return precautions discussed in full. Patient had no additional questions and expressed complete understanding.   Darliss Ridgel 10/24/21 2235    Charlesetta Shanks, MD 10/30/21 1025

## 2021-10-24 NOTE — ED Provider Notes (Signed)
I provided a substantive portion of the care of this patient.  I personally performed the entirety of the history and exam for this encounter.     Patient has complex medical history including dialysis Tuesday Thursday Saturday.  Prior history of right BKA.  Patient reports he is getting rash on his amputation site and discomfort.  He believes his protective sleeve is not fitting well around his leg for his prosthesis.  Also reports he is having pain on the front of the knee on the left and changes of skin rash.  Reports the knee on the left feels tight and he thinks there is fluid to drain.  No specific injury occurred.  He reports he has been applying lotions and ointments.  Reports he has something similar in the past and was treated at wound care with good result.       Some changes are chronic in appearance.  Left knee appears to have skin changes more consistent with superficial erosion possibly from mechanical rubbing.  There is some dryness crusting and some erythema.  On palpation I cannot appreciate any joint effusion.  He does seem to have some tenderness and fullness in the Pre patellar bursa but does not feel fluctuant or drainable. Patient is nontoxic and otherwise clinically well in appearance.  Does not appear to need admission for IV antibiotics.  Plan will be discharged on doxycycline and mupirocin.  Changes on the amputation site appear to be chronic skin changes from contact related issues with his prosthesis.  These do not appear infected.  No appearance of cellulitis over the amputation site.  This I believe will need consultation with provider managing prosthesis sizing fitting and management.  Recommend follow-up with orthopedics.   Charlesetta Shanks, MD 10/30/21 1024

## 2021-10-26 ENCOUNTER — Ambulatory Visit: Payer: Medicare (Managed Care) | Admitting: Occupational Therapy

## 2021-10-26 ENCOUNTER — Ambulatory Visit: Payer: Medicare (Managed Care)

## 2021-10-31 ENCOUNTER — Ambulatory Visit: Payer: Medicare (Managed Care) | Admitting: Occupational Therapy

## 2021-10-31 ENCOUNTER — Ambulatory Visit: Payer: Medicare (Managed Care)

## 2021-11-02 ENCOUNTER — Encounter: Payer: Medicare (Managed Care) | Admitting: Occupational Therapy

## 2021-11-02 ENCOUNTER — Ambulatory Visit: Payer: Medicare (Managed Care)

## 2021-11-03 ENCOUNTER — Emergency Department (HOSPITAL_COMMUNITY)
Admission: EM | Admit: 2021-11-03 | Discharge: 2021-11-03 | Disposition: A | Discharge status: Home / Self Care | Payer: Medicare (Managed Care) | Attending: Emergency Medicine | Admitting: Emergency Medicine

## 2021-11-03 ENCOUNTER — Encounter (HOSPITAL_COMMUNITY): Payer: Self-pay | Admitting: Emergency Medicine

## 2021-11-03 ENCOUNTER — Other Ambulatory Visit: Payer: Self-pay

## 2021-11-03 ENCOUNTER — Emergency Department (HOSPITAL_COMMUNITY): Payer: Medicare (Managed Care)

## 2021-11-03 DIAGNOSIS — I5022 Chronic systolic (congestive) heart failure: Secondary | ICD-10-CM | POA: Diagnosis not present

## 2021-11-03 DIAGNOSIS — F17210 Nicotine dependence, cigarettes, uncomplicated: Secondary | ICD-10-CM | POA: Insufficient documentation

## 2021-11-03 DIAGNOSIS — E1110 Type 2 diabetes mellitus with ketoacidosis without coma: Secondary | ICD-10-CM | POA: Insufficient documentation

## 2021-11-03 DIAGNOSIS — S199XXA Unspecified injury of neck, initial encounter: Secondary | ICD-10-CM | POA: Diagnosis present

## 2021-11-03 DIAGNOSIS — N186 End stage renal disease: Secondary | ICD-10-CM | POA: Diagnosis not present

## 2021-11-03 DIAGNOSIS — X509XXA Other and unspecified overexertion or strenuous movements or postures, initial encounter: Secondary | ICD-10-CM | POA: Diagnosis not present

## 2021-11-03 DIAGNOSIS — Z8616 Personal history of COVID-19: Secondary | ICD-10-CM | POA: Diagnosis not present

## 2021-11-03 DIAGNOSIS — Z7982 Long term (current) use of aspirin: Secondary | ICD-10-CM | POA: Insufficient documentation

## 2021-11-03 DIAGNOSIS — Z794 Long term (current) use of insulin: Secondary | ICD-10-CM | POA: Diagnosis not present

## 2021-11-03 DIAGNOSIS — Z992 Dependence on renal dialysis: Secondary | ICD-10-CM | POA: Diagnosis not present

## 2021-11-03 DIAGNOSIS — S12001A Unspecified nondisplaced fracture of first cervical vertebra, initial encounter for closed fracture: Secondary | ICD-10-CM

## 2021-11-03 DIAGNOSIS — S12041A Nondisplaced lateral mass fracture of first cervical vertebra, initial encounter for closed fracture: Secondary | ICD-10-CM | POA: Insufficient documentation

## 2021-11-03 DIAGNOSIS — E1122 Type 2 diabetes mellitus with diabetic chronic kidney disease: Secondary | ICD-10-CM | POA: Diagnosis not present

## 2021-11-03 DIAGNOSIS — I132 Hypertensive heart and chronic kidney disease with heart failure and with stage 5 chronic kidney disease, or end stage renal disease: Secondary | ICD-10-CM | POA: Insufficient documentation

## 2021-11-03 DIAGNOSIS — Z7984 Long term (current) use of oral hypoglycemic drugs: Secondary | ICD-10-CM | POA: Insufficient documentation

## 2021-11-03 MED ORDER — OXYCODONE HCL 5 MG PO TABS
10.0000 mg | ORAL_TABLET | Freq: Once | ORAL | Status: AC
Start: 2021-11-03 — End: 2021-11-03
  Administered 2021-11-03: 10 mg via ORAL
  Filled 2021-11-03: qty 2

## 2021-11-03 MED ORDER — ACETAMINOPHEN 325 MG PO TABS: 650.0000 mg | ORAL_TABLET | Freq: Four times a day (QID) | ORAL | Status: DC | PRN

## 2021-11-03 MED ORDER — OXYCODONE HCL 5 MG PO TABS
5.0000 mg | ORAL_TABLET | Freq: Once | ORAL | Status: AC
  Administered 2021-11-03: 5 mg via ORAL
  Filled 2021-11-03: qty 1

## 2021-11-03 MED ORDER — OXYCODONE HCL 5 MG PO TABS: 5.0000 mg | ORAL_TABLET | Freq: Four times a day (QID) | ORAL | 0 refills | Status: DC | PRN

## 2021-11-03 NOTE — ED Notes (Signed)
Called [facility]

## 2021-11-03 NOTE — ED Notes (Signed)
Pt requesting pain medication and the TV remote to work. Notified pt we would let the provider know, and we will try to find the TV remote.
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
## 2021-11-03 NOTE — ED Notes (Signed)
Discharge instructions discussed with pt. Pt verbalized understanding with no questions at this time. Pt to follow up with neurosurgery. Pt requests PTAR ride home.  Pt informed it would likely be several hours before PTAR could take pt home. Pt also informed he would move to the hallway. Pt states he doe snot want to wait that long, attempting to contact family for ride home.

## 2021-11-03 NOTE — ED Notes (Signed)
Pt unable to get ride, Network engineer requested PTAR.
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
## 2021-11-03 NOTE — ED Notes (Signed)
Pt transitioned self into wheelchair assisted to car by NT. Pt to be picked up by daughter. PTAR cancelled per pt request
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
## 2021-11-03 NOTE — Discharge Instructions (Addendum)
I have prescribed you a strong narcotic called oxycodone. Please only take this as prescribed. Do not drive or operate heavy machinery after taking this medication. Do not mix it with alcohol.   Below is the contact information for neurosurgery.  Please give them a call on Monday and schedule an appointment for reevaluation.  If you develop any new or worsening symptoms please come back to the emergency department.  It was a pleasure to meet you.

## 2021-11-15 ENCOUNTER — Emergency Department (HOSPITAL_COMMUNITY): Payer: Medicare (Managed Care)

## 2021-11-15 ENCOUNTER — Encounter (HOSPITAL_COMMUNITY): Payer: Self-pay

## 2021-11-15 ENCOUNTER — Observation Stay (HOSPITAL_COMMUNITY)
Admission: EM | Admit: 2021-11-15 | Discharge: 2021-11-21 | Payer: Medicare (Managed Care) | Attending: Internal Medicine | Admitting: Internal Medicine

## 2021-11-15 DIAGNOSIS — I5022 Chronic systolic (congestive) heart failure: Secondary | ICD-10-CM | POA: Insufficient documentation

## 2021-11-15 DIAGNOSIS — Z8616 Personal history of COVID-19: Secondary | ICD-10-CM | POA: Diagnosis not present

## 2021-11-15 DIAGNOSIS — Z794 Long term (current) use of insulin: Secondary | ICD-10-CM | POA: Insufficient documentation

## 2021-11-15 DIAGNOSIS — N186 End stage renal disease: Secondary | ICD-10-CM | POA: Insufficient documentation

## 2021-11-15 DIAGNOSIS — Z8673 Personal history of transient ischemic attack (TIA), and cerebral infarction without residual deficits: Secondary | ICD-10-CM | POA: Insufficient documentation

## 2021-11-15 DIAGNOSIS — Z992 Dependence on renal dialysis: Secondary | ICD-10-CM | POA: Insufficient documentation

## 2021-11-15 DIAGNOSIS — Z20822 Contact with and (suspected) exposure to covid-19: Secondary | ICD-10-CM | POA: Insufficient documentation

## 2021-11-15 DIAGNOSIS — R509 Fever, unspecified: Secondary | ICD-10-CM | POA: Diagnosis present

## 2021-11-15 DIAGNOSIS — X58XXXA Exposure to other specified factors, initial encounter: Secondary | ICD-10-CM | POA: Diagnosis not present

## 2021-11-15 DIAGNOSIS — Z79899 Other long term (current) drug therapy: Secondary | ICD-10-CM | POA: Diagnosis not present

## 2021-11-15 DIAGNOSIS — G9340 Encephalopathy, unspecified: Secondary | ICD-10-CM | POA: Diagnosis not present

## 2021-11-15 DIAGNOSIS — G9341 Metabolic encephalopathy: Secondary | ICD-10-CM | POA: Diagnosis present

## 2021-11-15 DIAGNOSIS — Z7982 Long term (current) use of aspirin: Secondary | ICD-10-CM | POA: Diagnosis not present

## 2021-11-15 DIAGNOSIS — E1122 Type 2 diabetes mellitus with diabetic chronic kidney disease: Secondary | ICD-10-CM | POA: Diagnosis not present

## 2021-11-15 DIAGNOSIS — S12000A Unspecified displaced fracture of first cervical vertebra, initial encounter for closed fracture: Principal | ICD-10-CM | POA: Insufficient documentation

## 2021-11-15 DIAGNOSIS — I132 Hypertensive heart and chronic kidney disease with heart failure and with stage 5 chronic kidney disease, or end stage renal disease: Secondary | ICD-10-CM | POA: Diagnosis not present

## 2021-11-15 LAB — AMMONIA: Ammonia: 27 umol/L (ref 9–35)

## 2021-11-15 LAB — CBC WITH DIFFERENTIAL/PLATELET
Abs Immature Granulocytes: 0.03 10*3/uL (ref 0.00–0.07)
Basophils Absolute: 0 10*3/uL (ref 0.0–0.1)
Basophils Relative: 0 %
Eosinophils Absolute: 0 10*3/uL (ref 0.0–0.5)
Eosinophils Relative: 0 %
HCT: 40.9 % (ref 39.0–52.0)
Hemoglobin: 12 g/dL — ABNORMAL LOW (ref 13.0–17.0)
Immature Granulocytes: 0 %
Lymphocytes Relative: 5 %
Lymphs Abs: 0.4 10*3/uL — ABNORMAL LOW (ref 0.7–4.0)
MCH: 26.6 pg (ref 26.0–34.0)
MCHC: 29.3 g/dL — ABNORMAL LOW (ref 30.0–36.0)
MCV: 90.7 fL (ref 80.0–100.0)
Monocytes Absolute: 0.7 10*3/uL (ref 0.1–1.0)
Monocytes Relative: 8 %
Neutro Abs: 7.3 10*3/uL (ref 1.7–7.7)
Neutrophils Relative %: 87 %
Platelets: 239 10*3/uL (ref 150–400)
RBC: 4.51 MIL/uL (ref 4.22–5.81)
RDW: 15.1 % (ref 11.5–15.5)
WBC: 8.5 10*3/uL (ref 4.0–10.5)
nRBC: 0 % (ref 0.0–0.2)

## 2021-11-15 LAB — I-STAT VENOUS BLOOD GAS, ED
Acid-base deficit: 11 mmol/L — ABNORMAL HIGH (ref 0.0–2.0)
Bicarbonate: 16.8 mmol/L — ABNORMAL LOW (ref 20.0–28.0)
Calcium, Ion: <0.3 mmol/L — CL (ref 1.15–1.40)
HCT: 35 % — ABNORMAL LOW (ref 39.0–52.0)
Hemoglobin: 11.9 g/dL — ABNORMAL LOW (ref 13.0–17.0)
O2 Saturation: 93 %
Potassium: 4 mmol/L (ref 3.5–5.1)
Sodium: 132 mmol/L — ABNORMAL LOW (ref 135–145)
TCO2: 18 mmol/L — ABNORMAL LOW (ref 22–32)
pCO2, Ven: 44.4 mmHg (ref 44.0–60.0)
pH, Ven: 7.186 — CL (ref 7.250–7.430)
pO2, Ven: 81 mmHg — ABNORMAL HIGH (ref 32.0–45.0)

## 2021-11-15 LAB — COMPREHENSIVE METABOLIC PANEL
ALT: 12 U/L (ref 0–44)
AST: 21 U/L (ref 15–41)
Albumin: 2.7 g/dL — ABNORMAL LOW (ref 3.5–5.0)
Alkaline Phosphatase: 81 U/L (ref 38–126)
Anion gap: 16 — ABNORMAL HIGH (ref 5–15)
BUN: 37 mg/dL — ABNORMAL HIGH (ref 8–23)
CO2: 19 mmol/L — ABNORMAL LOW (ref 22–32)
Calcium: 8.4 mg/dL — ABNORMAL LOW (ref 8.9–10.3)
Chloride: 92 mmol/L — ABNORMAL LOW (ref 98–111)
Creatinine, Ser: 7.91 mg/dL — ABNORMAL HIGH (ref 0.61–1.24)
GFR, Estimated: 7 mL/min — ABNORMAL LOW (ref >60)
Glucose, Bld: 152 mg/dL — ABNORMAL HIGH (ref 70–99)
Potassium: 5.4 mmol/L — ABNORMAL HIGH (ref 3.5–5.1)
Sodium: 127 mmol/L — ABNORMAL LOW (ref 135–145)
Total Bilirubin: 1.7 mg/dL — ABNORMAL HIGH (ref 0.3–1.2)
Total Protein: 6.8 g/dL (ref 6.5–8.1)

## 2021-11-15 LAB — OSMOLALITY: Osmolality: 292 mOsm/kg (ref 275–295)

## 2021-11-15 LAB — BRAIN NATRIURETIC PEPTIDE: B Natriuretic Peptide: 649.9 pg/mL — ABNORMAL HIGH (ref 0.0–100.0)

## 2021-11-15 LAB — RESP PANEL BY RT-PCR (FLU A&B, COVID) ARPGX2
Influenza A by PCR: NEGATIVE
Influenza B by PCR: NEGATIVE
SARS Coronavirus 2 by RT PCR: NEGATIVE

## 2021-11-15 LAB — CBG MONITORING, ED
Glucose-Capillary: 161 mg/dL — ABNORMAL HIGH (ref 70–99)
Glucose-Capillary: 125 mg/dL — ABNORMAL HIGH (ref 70–99)

## 2021-11-15 LAB — HEPATITIS B SURFACE ANTIBODY,QUALITATIVE: Hep B S Ab: REACTIVE — AB

## 2021-11-15 LAB — SALICYLATE LEVEL: Salicylate Lvl: <7 mg/dL — ABNORMAL LOW (ref 7.0–30.0)

## 2021-11-15 LAB — ACETAMINOPHEN LEVEL: Acetaminophen (Tylenol), Serum: <10 ug/mL — ABNORMAL LOW (ref 10–30)

## 2021-11-15 MED ORDER — CHLORHEXIDINE GLUCONATE CLOTH 2 % EX PADS
6.0000 | MEDICATED_PAD | Freq: Every day | CUTANEOUS | Status: DC
  Administered 2021-11-16 – 2021-11-21 (×6): 6 via TOPICAL

## 2021-11-15 MED ORDER — LIDOCAINE-PRILOCAINE 2.5-2.5 % EX CREA: 1.0000 "application " | TOPICAL_CREAM | CUTANEOUS | Status: DC | PRN

## 2021-11-15 MED ORDER — HEPARIN SODIUM (PORCINE) 1000 UNIT/ML DIALYSIS
1000.0000 [IU] | INTRAMUSCULAR | Status: DC | PRN
  Administered 2021-11-17: 1000 [IU] via INTRAVENOUS_CENTRAL
  Filled 2021-11-15 (×3): qty 1

## 2021-11-15 MED ORDER — LIDOCAINE HCL (PF) 1 % IJ SOLN: 5.0000 mL | INTRAMUSCULAR | Status: DC | PRN

## 2021-11-15 MED ORDER — ONDANSETRON HCL 4 MG/2ML IJ SOLN: 4.0000 mg | Freq: Once | INTRAMUSCULAR | Status: DC

## 2021-11-15 MED ORDER — SODIUM CHLORIDE 0.9 % IV SOLN: 100.0000 mL | INTRAVENOUS | Status: DC | PRN

## 2021-11-15 MED ORDER — HEPARIN SODIUM (PORCINE) 1000 UNIT/ML DIALYSIS
2000.0000 [IU] | INTRAMUSCULAR | Status: DC | PRN
  Filled 2021-11-15 (×2): qty 2

## 2021-11-15 MED ORDER — MIDODRINE HCL 5 MG PO TABS
10.0000 mg | ORAL_TABLET | ORAL | Status: DC | PRN
  Administered 2021-11-15 – 2021-11-21 (×4): 10 mg via ORAL
  Filled 2021-11-15 (×4): qty 2

## 2021-11-15 MED ORDER — PENTAFLUOROPROP-TETRAFLUOROETH EX AERO: 1.0000 "application " | INHALATION_SPRAY | CUTANEOUS | Status: DC | PRN

## 2021-11-15 MED ORDER — NALOXONE HCL 4 MG/0.1ML NA LIQD
0.4000 mg | Freq: Once | NASAL | Status: AC
  Administered 2021-11-15: 10:00:00 0.4 mg via NASAL

## 2021-11-15 MED ORDER — ALTEPLASE 2 MG IJ SOLR
2.0000 mg | Freq: Once | INTRAMUSCULAR | Status: DC | PRN
  Filled 2021-11-15: qty 2

## 2021-11-15 NOTE — ED Notes (Signed)
Remains lethargic, difficult to arouse. Respirations remains even and unlabored. MD aware.

## 2021-11-15 NOTE — ED Notes (Signed)
IV team was unable to obtain IV access or draw blood. Dr Earl Lites notified.

## 2021-11-15 NOTE — ED Notes (Signed)
Help get patient undressed into a gown did ekg shown to er provider

## 2021-11-15 NOTE — ED Notes (Signed)
Patient transported to CT 

## 2021-11-15 NOTE — ED Notes (Signed)
ED Provider at bedside. Patient awakens to oral sx with ED provider at bedside. Oriented x 4. Remains very drowsy.

## 2021-11-15 NOTE — ED Notes (Signed)
ED Provider at bedside.patient remains very lethargic after narcan, yawning and drooling, sx for clear secretions from mouth, will not let sx back of throat, snoring and drooling. Awakens briefly for sx then back to sleep and snoring.

## 2021-11-15 NOTE — ED Notes (Signed)
Got patient some warm blankets

## 2021-11-15 NOTE — ED Notes (Signed)
Provider at bedside. Patient did wake up to verbal, oriented.

## 2021-11-15 NOTE — ED Triage Notes (Signed)
Was refused by dialysis center after being bused to dialysis form [facility]. Patient with 2 weeks flu like symptoms-body aches, fever, general malaise. Vomited on bus x 1.

## 2021-11-15 NOTE — ED Provider Notes (Signed)
[facility]ERG[facility] DEPARTMENT Provider Note   CSN: [id number] Arrival date & time: 11/15/21  7425   LEVEL 5 CAVEAT: ALTERED MENTAL STATUS  History Chief Complaint  Patient presents with   Fever    Jose Robinson is a 65 y.o. male with history of end-stage renal disease, CHF with preserved ejection fraction, and DM who presents to the ED with altered mental status. Per nursing note, patient was rejected from his dialysis center after riding the bus there. He was obtunded and been having flu like illness for two weeks with associated fever, myalgias, and general malaise. He vomited prior to arrival.    Fever     Past Medical History:  Diagnosis Date   Diabetes mellitus without complication ([place])    ESRD on hemodialysis ([place])    Hypertension     Patient Active Problem List   Diagnosis Date Noted   Diabetes mellitus type 2, uncontrolled, with complications 95/63/8756   Diabetic nephropathy ([place]) 43/32/9518   Chronic systolic CHF (congestive heart failure) ([place]) 04/16/2021   Hypoglycemia 04/15/2021   Hyperglycemia 04/15/2021   DKA (diabetic ketoacidosis) ([place]) 01/24/2021   COVID-19 virus infection 01/24/2021   Syncope 01/24/2021   ESRD (end stage renal disease) ([place]) 01/24/2021   Congestive heart failure (CHF) ([place]) 01/24/2021   Hypotension 01/24/2021   Shock ([place]) 01/24/2021    Past Surgical History:  Procedure Laterality Date   BACK SURGERY     BELOW KNEE LEG AMPUTATION         Family History  Family history unknown: Yes    Social History   Tobacco Use   Smoking status: Every Day    Packs/day: 0.50    Types: Cigarettes   Smokeless tobacco: Never  Substance Use Topics   Alcohol use: Not Currently   Drug use: Not Currently    Home Medications Prior to Admission medications   Medication Sig Start Date End Date Taking? Authorizing Provider  aspirin 81 MG chewable tablet Chew 1 tablet (81 mg total) by mouth daily. 02/10/21   Charlynne Cousins, MD  atorvastatin (LIPITOR) 20 MG tablet Take 1 tablet (20 mg total) by mouth daily. 02/10/21   Charlynne Cousins, MD  blood glucose meter kit and supplies KIT Dispense based on patient and insurance preference. Use up to four times daily as directed. (FOR ICD-9 250.00, 250.01). Patient taking differently: Inject 1 each into the skin See admin instructions. Dispense based on patient and insurance preference. Use up to four times daily as directed. (FOR ICD-9 250.00, 250.01). 01/30/21   Donne Hazel, MD  brimonidine [facility]) 0.2 % ophthalmic solution Place 1 drop into both eyes in the morning and at bedtime.    [provider]  calcitRIOL (ROCALTROL) 0.5 MCG capsule Take 2 capsules (1 mcg total) by mouth Every Tuesday,Thursday,and Saturday with dialysis. 04/17/21   [name], MD  calcium acetate (PHOSLO) 667 MG capsule Take 2 capsules (1,334 mg total) by mouth 3 (three) times daily with meals. 04/17/21   [name], MD  calcium elemental as carbonate (CALCIUM ANTACID ULTRA STRENGTH) 400 MG chewable tablet Chew 2,000 mg by mouth daily.    [provider]  donepezil (ARICEPT) 10 MG tablet Take 10 mg by mouth daily. 12/30/20   [provider]  doxycycline (VIBRAMYCIN) 100 MG capsule Take 1 capsule (100 mg total) by mouth 2 (two) times daily. 10/24/21   Redwine, Madison A, PA-C  Dulaglutide (TRULICITY) 1.5 AC/1.6SA SOPN Inject 1.5  mg into the skin every Thursday.    [provider]  famotidine (PEPCID) 20 MG tablet Take 0.5 tablets (10 mg total) by mouth daily. 08/01/21   Tedd Sias, PA  gabapentin (NEURONTIN) 100 MG capsule Take 200 mg by mouth 3 (three) times daily. 12/30/20   [provider]  glucose 4 GM chewable tablet Chew 2 tablets (8 g total) by mouth 4 (four) times daily as needed for low blood sugar (for CBG<80). 04/17/21   [name], MD  guaiFENesin-dextromethorphan (ROBITUSSIN DM) 100-10 MG/5ML syrup Take 10 mLs by  mouth every 4 (four) hours as needed for cough (chest congestion). 04/17/21   [name], MD  insulin aspart (NOVOLOG FLEXPEN) 100 UNIT/ML FlexPen Inject 3 Units into the skin 3 (three) times daily with meals as needed for high blood sugar (CBG>180). Patient taking differently: Inject 1-8 Units into the skin See admin instructions. Check FSBS before each meal at bedtime and inject per SSI: 0-150 = 0 units  150-200 = 1 unit  201 - 250 = 2 units  251 - 250 = 3 units  350 - 400 = 5 units  Greater than 400 = 8 units (Prime pen with 2 units prior to each use). 04/17/21   [name], MD  Insulin Glargine [facility]) 100 UNIT/ML Inject 8 Units into the skin at bedtime.    [provider]  latanoprost (XALATAN) 0.005 % ophthalmic solution Place 1 drop into both eyes at bedtime.    [provider]  lidocaine (LIDODERM) 5 % Place 1 patch onto the skin daily. Remove & Discard patch within 12 hours or as directed by MD Patient not taking: Reported on 08/01/2021 04/17/21   [name], MD  loperamide (IMODIUM A-D) 2 MG tablet Take 2-4 mg by mouth See admin instructions. 4 mg after 1st loose stool, then 2 mg after each loose stool. Max 4 tabs in 24 hours    [provider]  loratadine (CLARITIN) 10 MG tablet Take 10 mg by mouth daily.    [provider]  midodrine (PROAMATINE) 5 MG tablet Take 1 tablet (5 mg total) by mouth every Tuesday, Thursday, Saturday, and Sunday. 04/17/21   [name], MD  mupirocin cream (BACTROBAN) 2 % Apply 1 application topically 2 (two) times daily. 10/24/21   Redwine, Madison A, PA-C  naproxen sodium (ALEVE) 220 MG tablet Take 440 mg by mouth 2 (two) times daily as needed (pain/headache).    [provider]  nystatin ointment (MYCOSTATIN) Apply 1 application topically daily.    [provider]  ondansetron (ZOFRAN ODT) 4 MG disintegrating tablet Take 1 tablet (4 mg total) by mouth every 8 (eight) hours as  needed for nausea or vomiting. 08/01/21   [place], Kathleene Hazel, PA  ondansetron (ZOFRAN) 4 MG tablet Take 4 mg by mouth every 8 (eight) hours as needed for nausea or vomiting. 02/02/18   [provider]  oxyCODONE (ROXICODONE) 5 MG immediate release tablet Take 1 tablet (5 mg total) by mouth every 6 (six) hours as needed for severe pain. 11/03/21   Rayna Sexton, PA-C  Pancrelipase, Lip-Prot-Amyl, (ZENPEP) 5000-24000 units CPEP Take 1 capsule by mouth 3 (three) times daily with meals.    [provider]  pantoprazole (PROTONIX) 40 MG tablet Take 1 tablet (40 mg total) by mouth daily. 04/17/21   [name], MD    Allergies    Acetaminophen, Prednisone, and Ivp dye [iodinated diagnostic agents]  Review of Systems   Review of Systems  Unable to perform ROS: Mental status change  Constitutional:  Positive for fever.   Physical Exam Updated Vital Signs BP 118/74   Pulse 80   Temp 97.8 F (36.6 C) (Oral)   Resp 13   Ht _0  (1.803 m)   SpO2 100%   BMI 20.36 kg/m   Physical Exam Vitals and nursing note reviewed.  Constitutional:      General: He is sleeping. He is not in acute distress.    Appearance: Normal appearance.  HENT:     Head: Normocephalic and atraumatic.  Eyes:     General:        Right eye: No discharge.        Left eye: No discharge.     Pupils:     Right eye: Pupil is sluggish.     Left eye: Pupil is sluggish.     Comments: Pupils are 2 mm bilaterally.  Cardiovascular:     Comments: Regular rate and rhythm.  S1/S2 are distinct without any evidence of murmur, rubs, or gallops.  Radial pulses are 2+ bilaterally.  Dorsalis pedis pulses are 2+ bilaterally.  No evidence of pedal edema. Pulmonary:     Comments: Gurgling breathing.  Patient has diffuse rhonchi heard with anterior auscultation.  He is in no significant respiratory distress at this time.  No accessory muscle usage. Abdominal:     General: Abdomen is flat. Bowel sounds are normal. There  is no distension.     Tenderness: There is no abdominal tenderness. There is no guarding or rebound.  Musculoskeletal:        General: Normal range of motion.     Cervical back: Neck supple.  Skin:    General: Skin is warm and dry.     Findings: No rash.  Neurological:     General: No focal deficit present.     Mental Status: He is lethargic.     GCS: GCS eye subscore is 3. GCS verbal subscore is 2. GCS motor subscore is 4.  Psychiatric:        Mood and Affect: Mood normal.        Behavior: Behavior normal.    ED Results / Procedures / Treatments   Labs (all labs ordered are listed, but only abnormal results are displayed) Labs Reviewed  CBC WITH DIFFERENTIAL/PLATELET - Abnormal; Notable for the following components:      Result Value   Hemoglobin 12.0 (*)    MCHC 29.3 (*)    Lymphs Abs 0.4 (*)    All other components within normal limits  COMPREHENSIVE METABOLIC PANEL - Abnormal; Notable for the following components:   Sodium 127 (*)    Potassium 5.4 (*)    Chloride 92 (*)    CO2 19 (*)    Glucose, Bld 152 (*)    BUN 37 (*)    Creatinine, Ser 7.91 (*)    Calcium 8.4 (*)    Albumin 2.7 (*)    Total Bilirubin 1.7 (*)    GFR, Estimated 7 (*)    Anion gap 16 (*)    All other components within normal limits  BRAIN NATRIURETIC PEPTIDE - Abnormal; Notable for the following components:   B Natriuretic Peptide 649.9 (*)    All other components within normal limits  ACETAMINOPHEN LEVEL - Abnormal; Notable for the following components:   Acetaminophen (Tylenol), Serum <10 (*)    All other components within normal limits  SALICYLATE LEVEL - Abnormal; Notable for the following components:   Salicylate Lvl <3.0 (*)    All other components within normal limits  CBG MONITORING, ED - Abnormal; Notable for the following components:   Glucose-Capillary 161 (*)    All other components within normal limits  I-STAT VENOUS BLOOD GAS, ED - Abnormal; Notable for the following components:    pH, Ven 7.186 (*)    pO2, Ven 81.0 (*)    Bicarbonate 16.8 (*)    TCO2 18 (*)    Acid-base deficit 11.0 (*)    Sodium 132 (*)    Calcium, Ion <0.30 (*)    HCT 35.0 (*)    Hemoglobin 11.9 (*)    All other components within normal limits  RESP PANEL BY RT-PCR (FLU A&B, COVID) ARPGX2  OSMOLALITY  AMMONIA  BLOOD GAS, VENOUS  RAPID URINE DRUG SCREEN, HOSP PERFORMED    EKG EKG Interpretation  Date/Time:  Thursday November 15 2021 09:05:14 EST Ventricular Rate:  91 PR Interval:  210 QRS Duration: 133 QT Interval:  376 QTC Calculation: 463 R Axis:   -39 Text Interpretation: Sinus rhythm Borderline prolonged PR interval IVCD LBBB as noted on prior tracing Similar to prior tracing Confirmed by Wynona Dove ([id number]) on 11/15/2021 1:43:24 PM  Radiology CT HEAD WO CONTRAST (5MM)  Result Date: 11/15/2021 CLINICAL DATA:  [place] EXAM: CT HEAD WITHOUT CONTRAST TECHNIQUE: Contiguous axial images were obtained from the base of the skull through the vertex without intravenous contrast. COMPARISON:  CT head April 15, 2021.  CT cervical spine 09/03/2021. FINDINGS: Brain: Patchy white matter hypoattenuation no evidence of acute infarction, hemorrhage, hydrocephalus, extra-axial collection or mass lesion/mass effect., nonspecific but compatible with chronic microvascular disease. Mild generalized atrophy. Vascular: No hyperdense vessel identified. Calcific intracranial atherosclerosis. Skull: Left posterior scalp and subcutaneous soft tissue thickening/stranding. No acute fracture. Sinuses/Orbits: Left maxillary sinus mucosal thickening with osteitis, suggestive of chronicity. Other: Partially imaged C1 ring fractures, better characterized on recent CT cervical spine. IMPRESSION: 1. No evidence of acute intracranial abnormality. 2. Mild chronic microvascular ischemic disease and atrophy. 3. Nonspecific left posterior scalp and subcutaneous soft tissue thickening/stranding. 4. Partially imaged C1 ring  fractures, better characterized on recent CT cervical spine. 5. Chronic appearing left maxillary sinus mucosal thickening. Electronically Signed   By: Margaretha Sheffield M.D.   On: 11/15/2021 11:15   DG Chest Port 1 View  Result Date: 11/15/2021 CLINICAL DATA:  Flu like symptoms EXAM: PORTABLE CHEST 1 VIEW COMPARISON:  08/01/2021 FINDINGS: Left IJ approach hemodialysis catheter remains in place, terminating at the level of the right atrium. Stable cardiomediastinal contours. Atherosclerotic calcification of the aortic knob. Mild bilateral interstitial prominence without focal airspace consolidation. No pleural effusion or pneumothorax. Vascular stents again noted within the bilateral axillary regions. IMPRESSION: Mild bilateral interstitial prominence which may reflect mild edema. Electronically Signed   By: Davina Poke D.O.   On: 11/15/2021 10:04    Procedures Procedures   Medications Ordered in ED Medications  ondansetron (ZOFRAN) injection 4 mg (4 mg Intravenous Not Given 11/15/21 1416)  naloxone (NARCAN) nasal spray 4 mg/0.1 mL (0.4 mg Nasal Provided for home use 11/15/21 1010)    ED Course  I have reviewed the triage vital signs and the nursing notes.  Pertinent labs & imaging results that were available during my care of the patient were reviewed by me and considered in my medical decision making (see chart for details).  Clinical Course as of 11/15/21 1601  Thu Nov 15, 2021  1223 I-Stat venous blood gas, ED(!!) Looks to be a metabolic acidosis.  Given his history of renal failure could be a source was metabolic acidosis and his altered mental status. [CF]  1506 I spoke with the hospitalist who agrees to admit the patient. Will speak with nephrology per their request.  [CF]  [phone] Spoke with Dr. Jonnie Finner with nephrology and he agrees to consult.  [CF]    Clinical Course User Index [CF] Cherrie Gauze   MDM Rules/Calculators/A&P                          Arsal KOOPER GODSHALL is a 65 y.o. male who presents the emergency department with altered mental status.  Patient was poorly arousable with gurgling breath sounds on my presentation.  He responds to his name but then probably goes back to sleep.  Vital signs are otherwise stable but given his altered mental status I will order CT head with appropriate altered mental status labs.  Pupils were pinpoint and sluggish.  We will try Narcan and judge response.   After 2 rounds of Narcan, patient began to perk up and then fell back asleep.  He is however less somnolent.  CBC without any evidence of leukocytosis.  Chronic ongoing anemia.  CMP showed hyponatremia, hyperkalemia, hypochloremia, elevated BUN, and elevated creatinine all in the setting of renal failure.  BNP was elevated.  This does clinically [place] sounds volume overloaded.  Salicylate and Tylenol levels were negative.  COVID was negative.  Influenza was negative.  Ammonia was negative.  Osmolality normal.  He did have evidence of metabolic acidosis on his blood gas.  My initial consideration was uremia in the setting of renal failure.   Final Clinical Impression(s) / ED Diagnoses Final diagnoses:  Encephalopathy    Rx / DC Orders ED Discharge Orders     None        Hendricks Limes, PA-C 11/15/21 1601    Jeanell Sparrow, DO 11/16/21 2030

## 2021-11-15 NOTE — ED Notes (Signed)
IV team at bedside for IV access and blood draw

## 2021-11-15 NOTE — ED Notes (Signed)
Patient was cleaned for medium soft/formed stool, skin care given, linens changed. Patient was able to assist with turning, remains drowsy, but more easily awakened.

## 2021-11-15 NOTE — Consult Note (Signed)
[facility] Renal Consultation Note    Indication for Consultation:  Management of ESRD/hemodialysis; anemia, hypertension/volume and secondary hyperparathyroidism  PCP:Pcp, No  HPI: Jose Robinson is a 65 y.o. male. ESRD on HD TTS at [facility].  Past medical history significant for DM, HTN, PVD s/p R BKA, chronic wounds on RLE.    Patient presented to the ED this AM due to AMS.  Per outpatient dialysis center he was dropped off outside the center and vomited on the side walk.  Nurse reports he was lethargic, had incoherent speech, and "was too weak to lift his foot onto the foot pedal of his wheelchair."  They called EMS and patient was transported to the ED for evaluation.    Patient seen and examined in the ED.  Patient history unreliable. Lethargic, opens eye to repeat verbal stimuli, answer questions with a few words and returns to sleep.  Denies CP, SOB, abdominal pain and n/v/d.  Of note patient recently seen in ED for neck fracture, thought by neurosurgery to be chronic and was discharged on oxycodone.  Patient denies taking medication in excess.   Pertinent findings include lethargy, hypoxia requiring 2L O2, negative respiratory panel, CT head with no acute findings and CXR with mild edema. Patient is being admitted for further evaluation and management.   Past Medical History:  Diagnosis Date   Diabetes mellitus without complication ([place])    ESRD on hemodialysis ([place])    Hypertension    Past Surgical History:  Procedure Laterality Date   BACK SURGERY     BELOW KNEE LEG AMPUTATION     Family History  Family history unknown: Yes   Social History:  reports that he has been smoking cigarettes. He has been smoking an average of .5 packs per day. He has never used smokeless tobacco. He reports that he does not currently use alcohol. He reports that he does not currently use drugs. Allergies  Allergen Reactions   Acetaminophen Palpitations   Prednisone Other (See  Comments)    Severe hallucinations/paranoid delusions after steroid premedications for contrast allergy, required IV haldol and restraints.    Ivp Dye [Iodinated Diagnostic Agents] Hives   Prior to Admission medications   Medication Sig Start Date End Date Taking? Authorizing Provider  aspirin 81 MG chewable tablet Chew 1 tablet (81 mg total) by mouth daily. 02/10/21   Charlynne Cousins, MD  atorvastatin (LIPITOR) 20 MG tablet Take 1 tablet (20 mg total) by mouth daily. 02/10/21   Charlynne Cousins, MD  blood glucose meter kit and supplies KIT Dispense based on patient and insurance preference. Use up to four times daily as directed. (FOR ICD-9 250.00, 250.01). Patient taking differently: Inject 1 each into the skin See admin instructions. Dispense based on patient and insurance preference. Use up to four times daily as directed. (FOR ICD-9 250.00, 250.01). 01/30/21   Donne Hazel, MD  brimonidine [facility]) 0.2 % ophthalmic solution Place 1 drop into both eyes in the morning and at bedtime.    [provider]  calcitRIOL (ROCALTROL) 0.5 MCG capsule Take 2 capsules (1 mcg total) by mouth Every Tuesday,Thursday,and Saturday with dialysis. 04/17/21   [name], MD  calcium acetate (PHOSLO) 667 MG capsule Take 2 capsules (1,334 mg total) by mouth 3 (three) times daily with meals. 04/17/21   [name], MD  calcium elemental as carbonate (CALCIUM ANTACID ULTRA STRENGTH) 400 MG chewable tablet Chew 2,000 mg by mouth daily.  [provider]  donepezil (ARICEPT) 10 MG tablet Take 10 mg by mouth daily. 12/30/20   [provider]  doxycycline (VIBRAMYCIN) 100 MG capsule Take 1 capsule (100 mg total) by mouth 2 (two) times daily. 10/24/21   Redwine, Madison A, PA-C  Dulaglutide (TRULICITY) 1.5 JK/9.3OI SOPN Inject 1.5 mg into the skin every Thursday.    [provider]  famotidine (PEPCID) 20 MG tablet Take 0.5 tablets (10 mg total) by mouth daily. 08/01/21    Tedd Sias, PA  gabapentin (NEURONTIN) 100 MG capsule Take 200 mg by mouth 3 (three) times daily. 12/30/20   [provider]  glucose 4 GM chewable tablet Chew 2 tablets (8 g total) by mouth 4 (four) times daily as needed for low blood sugar (for CBG<80). 04/17/21   [name], MD  guaiFENesin-dextromethorphan (ROBITUSSIN DM) 100-10 MG/5ML syrup Take 10 mLs by mouth every 4 (four) hours as needed for cough (chest congestion). 04/17/21   [name], MD  insulin aspart (NOVOLOG FLEXPEN) 100 UNIT/ML FlexPen Inject 3 Units into the skin 3 (three) times daily with meals as needed for high blood sugar (CBG>180). Patient taking differently: Inject 1-8 Units into the skin See admin instructions. Check FSBS before each meal at bedtime and inject per SSI: 0-150 = 0 units  150-200 = 1 unit  201 - 250 = 2 units  251 - 250 = 3 units  350 - 400 = 5 units  Greater than 400 = 8 units (Prime pen with 2 units prior to each use). 04/17/21   [name], MD  Insulin Glargine [facility]) 100 UNIT/ML Inject 8 Units into the skin at bedtime.    [provider]  latanoprost (XALATAN) 0.005 % ophthalmic solution Place 1 drop into both eyes at bedtime.    [provider]  lidocaine (LIDODERM) 5 % Place 1 patch onto the skin daily. Remove & Discard patch within 12 hours or as directed by MD Patient not taking: Reported on 08/01/2021 04/17/21   [name], MD  loperamide (IMODIUM A-D) 2 MG tablet Take 2-4 mg by mouth See admin instructions. 4 mg after 1st loose stool, then 2 mg after each loose stool. Max 4 tabs in 24 hours    [provider]  loratadine (CLARITIN) 10 MG tablet Take 10 mg by mouth daily.    [provider]  midodrine (PROAMATINE) 5 MG tablet Take 1 tablet (5 mg total) by mouth every Tuesday, Thursday, Saturday, and Sunday. 04/17/21   [name], MD  mupirocin cream (BACTROBAN) 2 % Apply 1 application topically 2 (two) times daily.  10/24/21   Redwine, Madison A, PA-C  naproxen sodium (ALEVE) 220 MG tablet Take 440 mg by mouth 2 (two) times daily as needed (pain/headache).    [provider]  nystatin ointment (MYCOSTATIN) Apply 1 application topically daily.    [provider]  ondansetron (ZOFRAN ODT) 4 MG disintegrating tablet Take 1 tablet (4 mg total) by mouth every 8 (eight) hours as needed for nausea or vomiting. 08/01/21   Fond[place], Kathleene Hazel, PA  ondansetron (ZOFRAN) 4 MG tablet Take 4 mg by mouth every 8 (eight) hours as needed for nausea or vomiting. 02/02/18   [provider]  oxyCODONE (ROXICODONE) 5 MG immediate release tablet Take 1 tablet (5 mg total) by mouth every 6 (six) hours as needed for severe pain. 11/03/21   Rayna Sexton, PA-C  Pancrelipase, Lip-Prot-Amyl, (ZENPEP) 5000-24000 units CPEP Take  1 capsule by mouth 3 (three) times daily with meals.    [provider]  pantoprazole (PROTONIX) 40 MG tablet Take 1 tablet (40 mg total) by mouth daily. 04/17/21   [name], MD   Current Facility-Administered Medications  Medication Dose Route Frequency Provider Last Rate Last Admin   [START ON 11/16/2021] Chlorhexidine Gluconate Cloth 2 % PADS 6 each  6 each Topical Q0600 Jefferson Fullam, Ria Comment, PA       ondansetron (ZOFRAN) injection 4 mg  4 mg Intravenous Once Jeanell Sparrow, DO       Current Outpatient Medications  Medication Sig Dispense Refill   aspirin 81 MG chewable tablet Chew 1 tablet (81 mg total) by mouth daily. 30 tablet 3   atorvastatin (LIPITOR) 20 MG tablet Take 1 tablet (20 mg total) by mouth daily. 30 tablet 0   blood glucose meter kit and supplies KIT Dispense based on patient and insurance preference. Use up to four times daily as directed. (FOR ICD-9 250.00, 250.01). (Patient taking differently: Inject 1 each into the skin See admin instructions. Dispense based on patient and insurance preference. Use up to four times daily as directed. (FOR ICD-9 250.00,  250.01).) 1 each 0   brimonidine (ALPHAGAN) 0.2 % ophthalmic solution Place 1 drop into both eyes in the morning and at bedtime.     calcitRIOL (ROCALTROL) 0.5 MCG capsule Take 2 capsules (1 mcg total) by mouth Every Tuesday,Thursday,and Saturday with dialysis. 28 capsule 0   calcium acetate (PHOSLO) 667 MG capsule Take 2 capsules (1,334 mg total) by mouth 3 (three) times daily with meals. 180 capsule 0   calcium elemental as carbonate (CALCIUM ANTACID ULTRA STRENGTH) 400 MG chewable tablet Chew 2,000 mg by mouth daily.     donepezil (ARICEPT) 10 MG tablet Take 10 mg by mouth daily.     doxycycline (VIBRAMYCIN) 100 MG capsule Take 1 capsule (100 mg total) by mouth 2 (two) times daily. 20 capsule 0   Dulaglutide (TRULICITY) 1.5 XH/3.7JI SOPN Inject 1.5 mg into the skin every Thursday.     famotidine (PEPCID) 20 MG tablet Take 0.5 tablets (10 mg total) by mouth daily. 30 tablet 0   gabapentin (NEURONTIN) 100 MG capsule Take 200 mg by mouth 3 (three) times daily.     glucose 4 GM chewable tablet Chew 2 tablets (8 g total) by mouth 4 (four) times daily as needed for low blood sugar (for CBG<80). 50 tablet 0   guaiFENesin-dextromethorphan (ROBITUSSIN DM) 100-10 MG/5ML syrup Take 10 mLs by mouth every 4 (four) hours as needed for cough (chest congestion). 118 mL 0   insulin aspart (NOVOLOG FLEXPEN) 100 UNIT/ML FlexPen Inject 3 Units into the skin 3 (three) times daily with meals as needed for high blood sugar (CBG>180). (Patient taking differently: Inject 1-8 Units into the skin See admin instructions. Check FSBS before each meal at bedtime and inject per SSI: 0-150 = 0 units  150-200 = 1 unit  201 - 250 = 2 units  251 - 250 = 3 units  350 - 400 = 5 units  Greater than 400 = 8 units (Prime pen with 2 units prior to each use).) 15 mL 11   Insulin Glargine (BASAGLAR KWIKPEN) 100 UNIT/ML Inject 8 Units into the skin at bedtime.     latanoprost (XALATAN) 0.005 % ophthalmic solution Place 1 drop into both  eyes at bedtime.     lidocaine (LIDODERM) 5 % Place 1 patch onto the skin daily. Remove &  Discard patch within 12 hours or as directed by MD (Patient not taking: Reported on 08/01/2021) 30 patch 0   loperamide (IMODIUM A-D) 2 MG tablet Take 2-4 mg by mouth See admin instructions. 4 mg after 1st loose stool, then 2 mg after each loose stool. Max 4 tabs in 24 hours     loratadine (CLARITIN) 10 MG tablet Take 10 mg by mouth daily.     midodrine (PROAMATINE) 5 MG tablet Take 1 tablet (5 mg total) by mouth every Tuesday, Thursday, Saturday, and Sunday. 16 tablet 0   mupirocin cream (BACTROBAN) 2 % Apply 1 application topically 2 (two) times daily. 15 g 0   naproxen sodium (ALEVE) 220 MG tablet Take 440 mg by mouth 2 (two) times daily as needed (pain/headache).     nystatin ointment (MYCOSTATIN) Apply 1 application topically daily.     ondansetron (ZOFRAN ODT) 4 MG disintegrating tablet Take 1 tablet (4 mg total) by mouth every 8 (eight) hours as needed for nausea or vomiting. 20 tablet 0   ondansetron (ZOFRAN) 4 MG tablet Take 4 mg by mouth every 8 (eight) hours as needed for nausea or vomiting.     oxyCODONE (ROXICODONE) 5 MG immediate release tablet Take 1 tablet (5 mg total) by mouth every 6 (six) hours as needed for severe pain. 8 tablet 0   Pancrelipase, Lip-Prot-Amyl, (ZENPEP) 5000-24000 units CPEP Take 1 capsule by mouth 3 (three) times daily with meals.     pantoprazole (PROTONIX) 40 MG tablet Take 1 tablet (40 mg total) by mouth daily. 30 tablet 0   Labs: Basic Metabolic Panel: Recent Labs  Lab 11/15/21 1210 11/15/21 1219  NA 127* 132*  K 5.4* 4.0  CL 92*  --   CO2 19*  --   GLUCOSE 152*  --   BUN 37*  --   CREATININE 7.91*  --   CALCIUM 8.4*  --    Liver Function Tests: Recent Labs  Lab 11/15/21 1210  AST 21  ALT 12  ALKPHOS 81  BILITOT 1.7*  PROT 6.8  ALBUMIN 2.7*    Recent Labs  Lab 11/15/21 1210  AMMONIA 27   CBC: Recent Labs  Lab 11/15/21 1210 11/15/21 1219   WBC 8.5  --   NEUTROABS 7.3  --   HGB 12.0* 11.9*  HCT 40.9 35.0*  MCV 90.7  --   PLT 239  --     CBG: Recent Labs  Lab 11/15/21 0929  GLUCAP 161*    Studies/Results: CT HEAD WO CONTRAST (5MM)  Result Date: 11/15/2021 CLINICAL DATA:  Delirium EXAM: CT HEAD WITHOUT CONTRAST TECHNIQUE: Contiguous axial images were obtained from the base of the skull through the vertex without intravenous contrast. COMPARISON:  CT head April 15, 2021.  CT cervical spine 09/03/2021. FINDINGS: Brain: Patchy white matter hypoattenuation no evidence of acute infarction, hemorrhage, hydrocephalus, extra-axial collection or mass lesion/mass effect., nonspecific but compatible with chronic microvascular disease. Mild generalized atrophy. Vascular: No hyperdense vessel identified. Calcific intracranial atherosclerosis. Skull: Left posterior scalp and subcutaneous soft tissue thickening/stranding. No acute fracture. Sinuses/Orbits: Left maxillary sinus mucosal thickening with osteitis, suggestive of chronicity. Other: Partially imaged C1 ring fractures, better characterized on recent CT cervical spine. IMPRESSION: 1. No evidence of acute intracranial abnormality. 2. Mild chronic microvascular ischemic disease and atrophy. 3. Nonspecific left posterior scalp and subcutaneous soft tissue thickening/stranding. 4. Partially imaged C1 ring fractures, better characterized on recent CT cervical spine. 5. Chronic appearing left maxillary sinus mucosal thickening. Electronically Signed  By: Margaretha Sheffield M.D.   On: 11/15/2021 11:15   DG Chest Port 1 View  Result Date: 11/15/2021 CLINICAL DATA:  Flu like symptoms EXAM: PORTABLE CHEST 1 VIEW COMPARISON:  08/01/2021 FINDINGS: Left IJ approach hemodialysis catheter remains in place, terminating at the level of the right atrium. Stable cardiomediastinal contours. Atherosclerotic calcification of the aortic knob. Mild bilateral interstitial prominence without focal airspace  consolidation. No pleural effusion or pneumothorax. Vascular stents again noted within the bilateral axillary regions. IMPRESSION: Mild bilateral interstitial prominence which may reflect mild edema. Electronically Signed   By: Davina Poke D.O.   On: 11/15/2021 10:04    ROS: Unable to complete full ROS due to altered mental status.    Physical Exam: Vitals:   11/15/21 1500 11/15/21 1515 11/15/21 1530 11/15/21 1600  BP: 122/83 136/75 118/74 127/80  Pulse: 80 84 80 83  Resp: _0 Temp:      TempSrc:      SpO2: 100% 100% 100% 100%  Height:         General: chronically ill appearing male in NAD Head: NCAT, sclera not icteric, drooling Neck: in C collar Lungs: +rhonchi, nml WOB on 2L via [place] Heart: RRR. No murmur, rubs or gallops.  Abdomen: soft, nontender, +BS, no guarding, no rebound tenderness Lower extremities:R BKA w/prothesis in place, 1+ edema on L, +ulcer on L tibia Neuro: Lethargic Psych:  Responds to questions appropriately with a normal affect. Dialysis Access: [facility]  Dialysis Orders:  TTS - [facility]  4hrs, BFR 400, DFR 500,  EDW 64kg, 2K/ 2.5Ca  Access: TDC  Heparin 4000   Assessment/Plan:  Lethargy - etiology unclear, possibly medication related?  ESRD -  on HD TTS.  Orders written for HD today per regular schedule.   Hypertension/volume  - Blood pressure variable.  On midodrine pre HD. Increased volume on exam with mild edema noted on CXR.  Left 4L over dry weight last HD.  Plan for UF 3.5-4L as tolerated.   Anemia of CKD - Hgb 11.9. No indication for ESA.  Secondary Hyperparathyroidism -  Ca in goal. Will check phos. Not on VDRA.  Continue binders (phoslo 2AC TID) when eating.  Nutrition - Renal diet w/fluid restrictions when eating DMT2 - per PMD  Jen Mow, PA-C [facility] 11/15/2021, 4:29 PM

## 2021-11-15 NOTE — H&P (Addendum)
Date: 11/15/2021               Patient Name:  Jose Robinson MRN: [id number]  DOB: 1956/12/02 Age / Sex: 65 y.o., male   PCP: Pcp, No         Medical Service: Internal Medicine Teaching Service         Attending Physician: Dr. Aldine Contes, MD    First Contact: Dr. Jeanice Lim Pager: [phone]  Second Contact: Dr. Coy Saunas Pager: [phone]       After Hours (After 5p/  First Contact Pager: [phone]  weekends / holidays): Second Contact Pager: [phone]   Chief Complaint: AMS, vomiting,   History of Present Illness: Jose Robinson is a 65 year old male with a history of insulin-dependent T2DM, ESRD on hemodialysis, HTN, PVD s/p R BKA, and recent C1 fracture he presented to the ED after arriving at his scheduled dialysis center with symptoms of confusion and vomiting, staff called EMS.  On arrival to the ED, patient was somnolent and difficult to arouse.  Patient was vomiting and on physical exam, constricted pupils were noted.  Patient received 1 dose of Narcan by ED nurse with minimal improvement. Patient was seen and examined in the ED, patient was a poor historian.  Patient was easily aroused, however, would drift off to sleep when attempting to answer questions.  Patient was oriented to time, place, persons.  Patient was able to confirm that he receives dialysis Tuesday, Thursday and Saturday.  He denies being in any pain at this time.  He denies taking excess medications.  He confirmed his daughter as point of contact.  According to patient's daughter, Jose Robinson (whom was reached by phone), states patient resides at [facility] facility in which he is transported from that location to dialysis 3 times a week.  [place] last seen her father 2 days ago and states he was well and reports no sign of illness.  She goes on to say, earlier today patient was using scat bus transportation from [facility] to his dialysis center and while en route patient began vomiting.  Once patient arrived at  the dialysis center, he was refused dialysis due to his symptoms (suspected flulike symptoms) and was sent to the emergency department.   [place] states that about 1 week ago (11/03/2021) patient sustained an injury to his neck.  States that during a dialysis appointment, someone on staff grabbed him to adjust him in the chair and when pulling him a sudden onset neck pain and audible pop sound was heard and patient was in severe pain.  Patient was taken to the ED and was found to have fractures of the anterior and posterior rings and left lateral mass of C1 evidenced by CT of the cervical spine.  Patient was issued a C collar and discharged with oxycodone 5 mg daily.  Daughter reports that's the only new medication he has been taking recently.  Daughter states that patient is compliant with dialysis treatment and compliant with all other medications.  ED course: I-STAT venous blood gas- anion gap metabolic acidosis with pH of 7.1; CBC were reassuring with a normal white count; CMP significant for electrolyte imbalance, Na 127, potassium 5.4, creatinine 7.91, albumin 2.7; BMP 649; acetaminophen level, salicylate level and ammonia levels are all normal.  CBG 161; UDS pending; EKG NSR with LBBB    Meds:  No outpatient medications have been marked as taking for the 11/15/21 encounter [facility] Encounter).     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  Patient is disabled and lives in a facility, [facility].  He smokes 1 pack of cigarettes per day, 19 pack years.  No alcohol or illicit drug use.  Review of Systems: A complete ROS was negative except as per HPI.   Physical Exam: Blood pressure (!) 148/80, pulse 81, temperature 97.8 F (36.6 C), temperature source Oral, resp. rate 16, height 5\' 11"  (1.803 m), SpO2 100 %. Physical Exam Constitutional:      Appearance: He is ill-appearing.     Interventions: Cervical collar and nasal cannula in place.     Comments: Patient was obtunded but easily aroused.  Patient oriented to time, place, and person  HENT:     Head: Normocephalic and atraumatic.     Mouth/Throat:     Comments: Patient was drooling. Cardiovascular:     Rate and Rhythm: Normal rate.     Pulses: Normal pulses.  Pulmonary:     Breath sounds: Stridor present.     Comments: Auscultation anteriorly due to patient being obtunded and unable to move Chest:       Comments: Fistula located in the upper left chest Abdominal:     Palpations: Abdomen is soft.  Musculoskeletal:     Right hand: Swelling present.     Left hand: Swelling present.     Left lower leg: No edema.     Comments: Amputation of bilateral fifth phalanges Bilateral upper extremities were cool to touch.  Radial pulses intact     Right Lower Extremity: Right leg is amputated below knee.  Neurological:     Mental Status: He is easily aroused. He is lethargic.     EKG: personally reviewed my interpretation is NSR with LBBB  CXR: personally reviewed my interpretation is mild bilateral interstitial prominence which may reflect mild edema.  Assessment & Plan by Problem: Principal Problem:   Acute metabolic encephalopathy   Acute metabolic encephalopathy Anion gap metabolic acidosis Patient presented to the ED obtunded and vomiting.  Her nurse notes, patient has been experiencing fevers, myalgias, and  general malaise for the past 2 weeks.  He was scheduled for dialysis and was turned away due to his symptoms.  Patient recently sustained fracture of C1 in the neck and was prescribed oxycodone.  According to his daughter, that is the most recent new medication patient has been taking.  Initial labs reveal anion gap metabolic acidosis on venous blood gas. According to daughter patient is compliant with dialysis regimen, however, patient did not receive dialysis today. Likely suspecting, uremia as potential etiology. Patient has recently been taking oxycodone for pain relief, possible accidental overdose. On physical exam patient was noted to be somnolent but easily aroused, drooling and pinpoint pupils noted. Patient received 1 doses of Narcan in the ED with minimal improvement, overdose on opioids less likely, however pending UDS.  Acetaminophen levels, salicylate levels, and ammonia levels all  within normal limits. Patient is afebrile and white count is within normal limits, COVID negative, flu negative and chest x-ray reassuring, possible upper respiratory infection less likely.  Nephrology was consulted and has taken patient for hemodialysis today per regular schedule  ESRD Anemia of chronic disease Patient is on hemodialysis Tuesday Thursdays and Saturday.  Patient was unable to receive dialysis today at his usual dialysis center due to suppose flulike symptoms.  Patient was sent to the ED.  CMP significant for creatinine of 7.91, with electrolyte imbalance sodium 127, potassium 5.4, albumin 2.7 Baseline creatinine ~6.  EKG showed normal sinus rhythm with left bundle branch block, no T wave abnormalities.  Nephrology consulted and patient taken for hemodialysis today per regular schedule CBC significant for anemia hemoglobin of 12.  Likely in the setting of delayed hemodialysis.  Insulin-dependent type 2 diabetes with complications Patient has had right BKA and amputation of bilateral fifth phalanges  due to diabetic wound infections resulting in amputation. Last A1c was checked 11/08/2021 = 8.2%, which indicates poor glycemic control.  Although patient has anion gap metabolic acidosis,  CBG on admission was 162; DKA is lower on the differentials.  According to patient's daughter he takes his insulin intermittently, but for the past week he has been compliant. Patient home medications include Trulicity 1.5 mg and NovoLog 3 units with meals. -- Start SSI moderate once patient completes dialysis and is back on the floors. --Continue to monitor CBGs; CBG every 6 hours  Chronic systolic CHF Most recent echo 01/20/2021 revealed LF EF 45 to 50% with mildly decreased function of the left ventricle.  Left ventricle has abnormal septal motion and posterior lateral hypokinesis.  No valvulopathy noted.  Patient was not noted to have labored breathing.  But due to being obtunded, patient was placed on [place] 2 L.  Chest x-ray reveals mild edema. On physical exam, patient was noted to be slightly volume overloaded with edema of the bilateral upper extremities +1.  Lung auscultation could only be heard anteriorly, unable to determine presence of fluid in the lungs.  BMP was elevated at 649.  Patient could be mildly volume overloaded in the setting of delayed hemodialysis.  Exacerbation of CHF least likely responsible for current symptoms.  Patient was not short of breath and labored breathing was not noted on physical exam. --Continue to monitor volume status after hemodialysis treatment --Consider diuresis if volume overloaded following HD  Dispo: Admit patient to Observation with expected length of stay less than 2 midnights.  Signed: Timothy Lasso, MD 11/15/2021, 7:26 PM  Pager: [phone] After 5pm on weekdays and 1pm on weekends: On Call pager: [phone]

## 2021-11-16 ENCOUNTER — Encounter (HOSPITAL_COMMUNITY): Payer: Self-pay | Admitting: Internal Medicine

## 2021-11-16 DIAGNOSIS — S12000A Unspecified displaced fracture of first cervical vertebra, initial encounter for closed fracture: Secondary | ICD-10-CM | POA: Diagnosis not present

## 2021-11-16 DIAGNOSIS — G9341 Metabolic encephalopathy: Secondary | ICD-10-CM | POA: Diagnosis not present

## 2021-11-16 LAB — CBC
HCT: 41.5 % (ref 39.0–52.0)
Hemoglobin: 12.4 g/dL — ABNORMAL LOW (ref 13.0–17.0)
MCH: 26.4 pg (ref 26.0–34.0)
MCHC: 29.9 g/dL — ABNORMAL LOW (ref 30.0–36.0)
MCV: 88.5 fL (ref 80.0–100.0)
Platelets: 280 10*3/uL (ref 150–400)
RBC: 4.69 MIL/uL (ref 4.22–5.81)
RDW: 15.4 % (ref 11.5–15.5)
WBC: 10 10*3/uL (ref 4.0–10.5)
nRBC: 0 % (ref 0.0–0.2)

## 2021-11-16 LAB — HEPATITIS B SURFACE ANTIGEN: Hepatitis B Surface Ag: NONREACTIVE

## 2021-11-16 LAB — BASIC METABOLIC PANEL
Anion gap: 14 (ref 5–15)
BUN: 24 mg/dL — ABNORMAL HIGH (ref 8–23)
CO2: 25 mmol/L (ref 22–32)
Calcium: 8.5 mg/dL — ABNORMAL LOW (ref 8.9–10.3)
Chloride: 98 mmol/L (ref 98–111)
Creatinine, Ser: 6.1 mg/dL — ABNORMAL HIGH (ref 0.61–1.24)
GFR, Estimated: 10 mL/min — ABNORMAL LOW (ref >60)
Glucose, Bld: 57 mg/dL — ABNORMAL LOW (ref 70–99)
Potassium: 4.9 mmol/L (ref 3.5–5.1)
Sodium: 137 mmol/L (ref 135–145)

## 2021-11-16 LAB — BLOOD GAS, VENOUS
Acid-Base Excess: 1.6 mmol/L (ref 0.0–2.0)
Bicarbonate: 27.9 mmol/L (ref 20.0–28.0)
Drawn by: 6013
O2 Saturation: 55.1 %
Patient temperature: 37
pCO2, Ven: 63.5 mmHg — ABNORMAL HIGH (ref 44.0–60.0)
pH, Ven: 7.266 (ref 7.250–7.430)
pO2, Ven: 34.4 mmHg (ref 32.0–45.0)

## 2021-11-16 LAB — CREATININE, SERUM
Creatinine, Ser: 5.03 mg/dL — ABNORMAL HIGH (ref 0.61–1.24)
GFR, Estimated: 12 mL/min — ABNORMAL LOW (ref >60)

## 2021-11-16 LAB — LACTIC ACID, PLASMA
Lactic Acid, Venous: 1.1 mmol/L (ref 0.5–1.9)
Lactic Acid, Venous: 1.3 mmol/L (ref 0.5–1.9)

## 2021-11-16 LAB — GLUCOSE, CAPILLARY
Glucose-Capillary: 101 mg/dL — ABNORMAL HIGH (ref 70–99)
Glucose-Capillary: 118 mg/dL — ABNORMAL HIGH (ref 70–99)

## 2021-11-16 LAB — PHOSPHORUS: Phosphorus: 3.4 mg/dL (ref 2.5–4.6)

## 2021-11-16 LAB — HEMOGLOBIN A1C
Hgb A1c MFr Bld: 7.6 % — ABNORMAL HIGH (ref 4.8–5.6)
Mean Plasma Glucose: 171.42 mg/dL

## 2021-11-16 MED ORDER — CALCIUM ACETATE (PHOS BINDER) 667 MG PO CAPS
1334.0000 mg | ORAL_CAPSULE | Freq: Three times a day (TID) | ORAL | Status: DC
  Administered 2021-11-16 – 2021-11-21 (×11): 1334 mg via ORAL
  Filled 2021-11-16 (×11): qty 2

## 2021-11-16 MED ORDER — INSULIN ASPART 100 UNIT/ML IJ SOLN
0.0000 [IU] | Freq: Three times a day (TID) | INTRAMUSCULAR | Status: DC
  Administered 2021-11-17 – 2021-11-18 (×2): 1 [IU] via SUBCUTANEOUS
  Administered 2021-11-18 – 2021-11-20 (×2): 2 [IU] via SUBCUTANEOUS
  Administered 2021-11-20: 3 [IU] via SUBCUTANEOUS
  Administered 2021-11-21: 1 [IU] via SUBCUTANEOUS
  Administered 2021-11-21: 2 [IU] via SUBCUTANEOUS

## 2021-11-16 MED ORDER — ONDANSETRON HCL 4 MG PO TABS: 4.0000 mg | ORAL_TABLET | Freq: Four times a day (QID) | ORAL | Status: DC | PRN

## 2021-11-16 MED ORDER — HEPARIN SODIUM (PORCINE) 5000 UNIT/ML IJ SOLN
5000.0000 [IU] | Freq: Three times a day (TID) | INTRAMUSCULAR | Status: DC
  Administered 2021-11-16 – 2021-11-21 (×16): 5000 [IU] via SUBCUTANEOUS
  Filled 2021-11-16 (×15): qty 1

## 2021-11-16 MED ORDER — ONDANSETRON HCL 4 MG/2ML IJ SOLN: 4.0000 mg | Freq: Four times a day (QID) | INTRAMUSCULAR | Status: DC | PRN

## 2021-11-16 MED ORDER — SENNOSIDES-DOCUSATE SODIUM 8.6-50 MG PO TABS: 1.0000 | ORAL_TABLET | Freq: Every evening | ORAL | Status: DC | PRN

## 2021-11-16 NOTE — Progress Notes (Signed)
Came to assess patient to determine his home mobility and assess R BKA site. He reports that he is able to ambulate a few feet with his prosthesis and ambulates with his wheelchair for longer distances.   Picture of the RLE:    Corky Sox, MD PGY-1 Pager: [phone]

## 2021-11-16 NOTE — Plan of Care (Signed)

## 2021-11-16 NOTE — Evaluation (Addendum)
Physical Therapy Evaluation Patient Details Name: Jose Robinson MRN: [id number] DOB: 03-23-1956 Today's Date: 11/16/2021  History of Present Illness  65 yo male with onset of fracture on C1 ring at HD was seen in ED and returned to ALF.  He was returned to hosp on 11/17, currently fairly somnolent, but easily arousable.  New dx of anion gap metabolic acidosis, acute metabolic encephalopathy, anemia, pulm edema.  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  Skin is barely closed and will require medical approval to wear prosthesis.  PMHx:  PVD, atherosclerosis, ESRD, DM, on HD,  Clinical Impression  Pt was seen with daughter in attendance to help with history questions.  Pt is an ALF resident who was  home from sustaining a new C1 fracture in HD, but afterward became confused and disoriented.  Pt is up to side of bed and noted his R knee skin change with fragile skin overlying a wound from three weeks ago.  Pt has his new RLE prosthesis with him and PT is awaiting instructions for the use of the leg, if at all currently.  His permission to use leg affects gait, and so the answer may impact his ability to go to ALF.  Follow acutely for goals and await instructions from MD regarding prosthesis use given the recent skin change and the source being his old prosthetic.   Will provide written instructions on C-spine precautions to pt and family as his mentation clears.          Recommendations for follow up therapy are one component of a multi-disciplinary discharge planning process, led by the attending physician.  Recommendations may be updated based on patient status, additional functional criteria and insurance authorization.  Follow Up Recommendations Skilled nursing-short term rehab (<3 hours/day)    Assistance Recommended at Discharge Frequent or constant Supervision/Assistance  Functional Status Assessment Patient has had a recent decline in  their functional status and demonstrates the ability to make significant improvements in function in a reasonable and predictable amount of time.  Equipment Recommendations  None recommended by PT    Recommendations for Other Services       Precautions / Restrictions Precautions Precautions: Fall;Cervical Precaution Booklet Issued: No Precaution Comments: verbally reviewed, pt is lethargic at times Required Braces or Orthoses: Cervical Brace;Other Brace Cervical Brace: Hard collar;At all times Other Brace: RLE prosthesis with newly made leg Restrictions Weight Bearing Restrictions: Yes RLE Weight Bearing: Non weight bearing Other Position/Activity Restrictions: has skin change on R tib tuberosity awaiting medical clearance to don prosthesis      Mobility  Bed Mobility Overal bed mobility: Needs Assistance Bed Mobility: Sidelying to Sit;Sit to Sidelying;Rolling Rolling: Min assist Sidelying to sit: Min assist     Sit to sidelying: Min assist General bed mobility comments: min assist mainly to maintain safety with body mechanics    Transfers Overall transfer level: Needs assistance Equipment used: Rolling walker (2 wheels);1 person hand held assist Transfers: Sit to/from Stand (lateral scoot) Sit to Stand: Total assist           General transfer comment: pt is min assist to laterally scoot and tends to forget the exact instructions PT gives him, repetitively instructed to stay sitting and use spinal body mechanics    Ambulation/Gait               General Gait Details: unable yet, awaiting MD clearance on skin R ant BK  Stairs            Wheelchair Mobility    Modified Rankin (Stroke Patients Only)       Balance                                             Pertinent Vitals/Pain Pain Assessment: 0-10 Pain Score: 9  Pain Location: neck and shoulders Pain Descriptors / Indicators: Guarding Pain Intervention(s): Limited  activity within patient's tolerance;Monitored during session;Premedicated before session;Repositioned (wearing collar at all times, reviewed body mechanics)    Home Living Family/patient expects to be discharged to:: Assisted living                   Additional Comments: pt has 24/7 caregivers    Prior Function Prior Level of Function : Needs assist       Physical Assist : Mobility (physical);ADLs (physical) Mobility (physical): Gait;Transfers ADLs (physical): Bathing Mobility Comments: pt requires supervision to walk distances on RW and supervised on HD sessions for transfers ADLs Comments: nursing assists him with bathing out of the shower     Hand Dominance   Dominant Hand: Right    Extremity/Trunk Assessment   Upper Extremity Assessment Upper Extremity Assessment: Overall WFL for tasks assessed;LUE deficits/detail LUE Deficits / Details: amputation of lateral L hand LUE Coordination: decreased gross motor;decreased fine motor (related to hand)    Lower Extremity Assessment Lower Extremity Assessment: RLE deficits/detail;LLE deficits/detail RLE Deficits / Details: R bk amp with skin wound on site of tib tuberosity RLE Coordination: decreased gross motor LLE Coordination: decreased gross motor    Cervical / Trunk Assessment Cervical / Trunk Assessment: Other exceptions (cervical spine C1 fracture)  Communication   Communication: No difficulties  Cognition Arousal/Alertness: Lethargic Behavior During Therapy: Flat affect;Impulsive Overall Cognitive Status: Impaired/Different from baseline Area of Impairment: Problem solving;Awareness;Safety/judgement;Following commands;Memory;Attention;Orientation                 Orientation Level: Situation Current Attention Level: Selective Memory: Decreased short-term memory;Decreased recall of precautions Following Commands: Follows one step commands with increased time Safety/Judgement: Decreased awareness of  deficits;Decreased awareness of safety Awareness: Intellectual Problem Solving: Slow processing;Requires verbal cues;Requires tactile cues General Comments: contacted MD about his prosthesis, pt insists he can wear it but will have MD clear the skin        General Comments General comments (skin integrity, edema, etc.): Pt was evaluated and found issues of skin breakdown from prev prosthesis on ant R lower leg, lethargic presentation and new C1 fracture with weaker UE support to scoot on the bed.    Exercises     Assessment/Plan    PT Assessment Patient needs continued PT services  PT Problem List Decreased strength;Decreased range of motion;Decreased activity tolerance;Decreased balance;Decreased mobility;Decreased coordination;Decreased cognition;Decreased knowledge of use of DME;Decreased safety awareness;Decreased skin integrity;Pain       PT Treatment Interventions Gait training;Functional mobility training;Therapeutic activities;Therapeutic exercise;Balance training;Neuromuscular re-education;Patient/family education    PT Goals (Current goals can be found in the Care Plan section)  Acute Rehab PT Goals Patient Stated Goal: to get walking and get home PT Goal Formulation: With patient/family Time For Goal Achievement: 11/30/21 Potential to Achieve Goals: Good    Frequency Min 3X/week   Barriers to discharge Decreased caregiver support accessible home but with reduced staff to pt ratio in ALF    Co-evaluation  AM-PAC PT "6 Clicks" Mobility  Outcome Measure Help needed turning from your back to your side while in a flat bed without using bedrails?: A Little Help needed moving from lying on your back to sitting on the side of a flat bed without using bedrails?: A Lot Help needed moving to and from a bed to a chair (including a wheelchair)?: A Lot Help needed standing up from a chair using your arms (e.g., wheelchair or bedside chair)?: Total Help  needed to walk in hospital room?: Total Help needed climbing 3-5 steps with a railing? : Total 6 Click Score: 10    End of Session Equipment Utilized During Treatment: Gait belt (has O2 on his forehead, nursing in and did not change location of cannula) Activity Tolerance: Patient limited by fatigue;Treatment limited secondary to medical complications (Comment);Patient limited by pain Patient left: in bed;with call bell/phone within reach;with bed alarm set;with family/visitor present;with nursing/sitter in room Nurse Communication: Mobility status;Other (comment);Weight bearing status (contacted MD for clearance of R lower leg wound for use of prosthesis) PT Visit Diagnosis: Muscle weakness (generalized) (M62.81);Pain;Other abnormalities of gait and mobility (R26.89);Difficulty in walking, not elsewhere classified (R26.2) Pain - Right/Left:  (neck and B shoulders) Pain - part of body:  (neck and B shoulders)    Time: 9163-8466 PT Time Calculation (min) (ACUTE ONLY): 37 min   Charges:   PT Evaluation $PT Eval Moderate Complexity: 1 Mod PT Treatments $Therapeutic Activity: 8-22 mins       Ramond Dial 11/16/2021, 4:58 PM  Mee Hives, PT PhD Acute Rehab Dept. Number: [place] and [place]

## 2021-11-16 NOTE — TOC Initial Note (Signed)
Transition of Care [facility]) - Initial/Assessment Note    Patient Details  Name: Jose Robinson MRN: [id number] Date of Birth: 11-25-1956  [facility] CM/SW Contact:    Pollie Friar, RN Phone Number: 11/16/2021, 3:15 PM  Clinical Narrative:                 Patient is from [place]. CM met with the patient and his daughter at the bedside. The plan is for patient to return to [facility] when medically ready. CM has asked Md for PT/OT evals.  ToC following.  Expected Discharge Plan: Assisted Living Barriers to Discharge: Continued Medical Work up   Patient Goals and CMS Choice   CMS Medicare.gov Compare Post Acute Care list provided to:: Patient Choice offered to / list presented to : Patient, Adult Children  Expected Discharge Plan and Services Expected Discharge Plan: Assisted Living In-house Referral: Clinical Social Work     Living arrangements for the past 2 months: [place]                                      Prior Living Arrangements/Services Living arrangements for the past 2 months: [place] Lives with:: Facility Resident Patient language and need for interpreter reviewed:: Yes Do you feel safe going back to the place where you live?: Yes          Current home services: DME (walker/ wheelchair/ prosthesis/ neck brace) Criminal Activity/Legal Involvement Pertinent to Current Situation/Hospitalization: No - Comment as needed  Activities of Daily Living Home Assistive Devices/Equipment: Wheelchair ADL Screening (condition at time of admission) Patient's cognitive ability adequate to safely complete daily activities?: Yes Is the patient deaf or have difficulty hearing?: No Does the patient have difficulty seeing, even when wearing glasses/contacts?: No Does the patient have difficulty concentrating, remembering, or making decisions?: No Patient able to express need for assistance with ADLs?:  Yes Does the patient have difficulty dressing or bathing?: Yes Independently performs ADLs?: Yes (appropriate for developmental age) Does the patient have difficulty walking or climbing stairs?: Yes Weakness of Legs: None Weakness of Arms/Hands: None  Permission Sought/Granted                  Emotional Assessment Appearance:: Appears stated age     Orientation: : Oriented to Self, Oriented to Place   Psych Involvement: No (comment)  Admission diagnosis:  Encephalopathy [G93.40] Fever [W09.8] Acute metabolic encephalopathy [J19.14] Patient Active Problem List   Diagnosis Date Noted   Acute metabolic encephalopathy 78/29/5621   Diabetes mellitus type 2, uncontrolled, with complications 30/86/5784   Diabetic nephropathy ([place]) 69/62/9528   Chronic systolic CHF (congestive heart failure) ([place]) 04/16/2021   Hypoglycemia 04/15/2021   Hyperglycemia 04/15/2021   DKA (diabetic ketoacidosis) ([place]) 01/24/2021   COVID-19 virus infection 01/24/2021   Syncope 01/24/2021   ESRD (end stage renal disease) ([place]) 01/24/2021   Congestive heart failure (CHF) ([place]) 01/24/2021   Hypotension 01/24/2021   Shock ([place]) 01/24/2021   PCP:  Pcp, No Pharmacy:   [place], [place] - [address] [place] [address] Phone: [phone] Fax: [phone]     Social Determinants of Health (SDOH) Interventions    Readmission Risk Interventions No flowsheet data found.

## 2021-11-16 NOTE — Progress Notes (Signed)
Pt receives out-pt HD at [place] on [facility]. Pt arrives around 6:15 for 6:35 chair time. Will assist as needed.  Melven Sartorius Renal Navigator [phone]

## 2021-11-16 NOTE — Progress Notes (Signed)
Subjective: I seen and evaluated Mr. Wirtz at bedside.  He was lying comfortably in bed.  He is oriented to person place and time.  However he would doze off back to sleep during the interview.  He was unable to tell me events leading up to his hospitalization.  He appears to not be at baseline yet.  Daughter was at bedside this afternoon and was able to provide collateral stating that this is not patient's baseline.  She states even when speaking with him he would drift off to sleep.  She states this is the first time he has been like this.  She states he was well a few days ago and is usually compliant with his hemodialysis treatments.  She is unsure what precipitated these new symptoms.   Objective:  Vital signs in last 24 hours: Vitals:   11/16/21 0403 11/16/21 0415 11/16/21 0742 11/16/21 1128  BP: (!) 99/45 (!) 91/49 (!) 103/58 (!) 98/51  Pulse: (!) 119 94 92 85  Resp:  18 16 18   Temp: 98.2 F (36.8 C) 99.6 F (37.6 C) 98.2 F (36.8 C) 98.7 F (37.1 C)  TempSrc: Oral Oral Axillary Oral  SpO2: 93% 96% 96% 100%  Height:       Physical Exam Constitutional:      General: He is not in acute distress.    Interventions: Cervical collar in place.  HENT:     Head: Normocephalic and atraumatic.  Eyes:     General: Lids are normal.  Cardiovascular:     Rate and Rhythm: Normal rate.  Pulmonary:     Effort: Pulmonary effort is normal.     Breath sounds: Normal air entry.  Abdominal:     Palpations: Abdomen is soft.  Musculoskeletal:     Left lower leg: No edema.  Skin:    General: Skin is warm and dry.  Neurological:     General: No focal deficit present.     Mental Status: He is easily aroused.     Comments: Patient is oriented x3.  But not at baseline.  Patient intermittently alert.  Psychiatric:        Speech: Speech is delayed.     Assessment/Plan:  Principal Problem:   Acute metabolic encephalopathy  Acute metabolic encephalopathy Anion gap metabolic  acidosis Patient received hemodialysis yesterday with improvement in his creatinine level.  However patient's mentation is not quite at baseline.  He is oriented to time place and persons, however, he drifts off to sleep during the exam.  Daughter was at bedside this afternoon and I spoke with her and she stated that this is not the patient's baseline.  She states that this has never happened in the past.  She says that the symptoms of altered behavior are new. --Patient is scheduled for dialysis tomorrow (Saturday) per his usual schedule. --If patient complains of pain please use Dilaudid NO oxycodone --Repeat BMP --Check lactic acid levels  ESRD Anemia of chronic disease Patient has improvement in creatinine levels (5.03 down from 7.91) since dialysis yesterday.  Hemoglobin has improved slightly (12.4 up from 11.9).  No indication for ESA.  Started on renal diet with fluid restriction when eating. --Daily BMP --Scheduled dialysis tomorrow  Insulin-dependent type 2 diabetes with complications Last D6Q checked 11/08/2021 = 8.2%.  Patient has not been eating much since admission. --SSI very sensitive  Chronic systolic CHF Patient did not appear volume overloaded on physical exam.  Patient has scheduled hemodialysis treatment tomorrow --Continue to monitor  volume status    Prior to Admission Living Arrangement: Anticipated Discharge Location: Barriers to Discharge: Dispo: Anticipated discharge in approximately 1-2 day(s).   Timothy Lasso, MD 11/16/2021, 3:10 PM Pager: [phone] After 5pm on weekdays and 1pm on weekends: On Call pager [phone]

## 2021-11-16 NOTE — Care Management Obs Status (Signed)
[place] NOTIFICATION   Patient Details  Name: ALSTON BERRIE MRN: [id number] Date of Birth: 01/01/1956   Medicare Observation Status Notification Given:  Yes    Pollie Friar, RN 11/16/2021, 3:13 PM

## 2021-11-16 NOTE — Progress Notes (Signed)
[facility] Progress Note  Subjective: no c/o  Vitals:   11/16/21 0403 11/16/21 0415 11/16/21 0742 11/16/21 1128  BP: (!) 99/45 (!) 91/49 (!) 103/58 (!) 98/51  Pulse: (!) 119 94 92 85  Resp:  18 16 18   Temp: 98.2 F (36.8 C) 99.6 F (37.6 C) 98.2 F (36.8 C) 98.7 F (37.1 C)  TempSrc: Oral Oral Axillary Oral  SpO2: 93% 96% 96% 100%  Height:        Exam: General: chronically ill appearing male in NAD Head: NCAT, sclera not icteric, drooling Neck: in C collar Lungs: +rhonchi, nml WOB on 2L via [place] Heart: RRR. No murmur, rubs or gallops.  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 Dialysis Access: [facility]    Home meds: protonix, zenpep, oxy IR, aleve, midodrine 10mg  ttss+ 5mg   ttss, imodium, insulin glargine, insulin aspart, neurontin, pepcid, trulicity, aricept, phoslo, lipitor, asa, prns/ vits/ supps  Dialysis Orders: TTS [facility]   4h  400/500  64kg  2/2.5  TDC  Hep 4000     Assessment/Plan:  Lethargy - seems much better after HD last night.   ESRD -  on HD TTS.  HD tomorrow.   Hypertension/volume  - Blood pressure variable.  On midodrine pre HD. Increased volume on exam with mild edema noted on CXR.  Left 4L over dry weight last HD.  Plan for UF 3.5-4L as tolerated.   Anemia of CKD - Hgb 11.9. No indication for ESA.  Secondary Hyperparathyroidism -  Ca in goal. Will check phos. Not on VDRA.  Continue binders (phoslo 2AC TID) when eating.  Nutrition - Renal diet w/fluid restrictions when eating DMT2 - per PMD    Rob Megin Consalvo 11/16/2021, 2:08 PM   Recent Labs  Lab 11/15/21 1210 11/15/21 1219 11/16/21 0337  K 5.4* 4.0  --   BUN 37*  --   --   CREATININE 7.91*  --  5.03*  CALCIUM 8.4*  --   --   PHOS  --   --  3.4  HGB 12.0* 11.9* 12.4*    Inpatient medications:  calcium acetate  1,334 mg Oral TID WC   Chlorhexidine Gluconate Cloth  6 each Topical Q0600   heparin  5,000 Units Subcutaneous Q8H   ondansetron (ZOFRAN) IV  4 mg Intravenous Once    sodium chloride     sodium chloride     sodium chloride, sodium chloride, alteplase, heparin, heparin, lidocaine (PF), lidocaine-prilocaine, midodrine, ondansetron **OR** ondansetron (ZOFRAN) IV, pentafluoroprop-tetrafluoroeth, senna-docusate

## 2021-11-16 NOTE — Progress Notes (Signed)
Date: 11/16/2021  Patient name: Jose Robinson  Medical record number: [id number]  Date of birth: 1956/11/14   I have seen and evaluated Jose Robinson and discussed their care with the Residency Team.  In brief, patient is a 65 year old male with past medical history of type 2 diabetes, ESRD on hemodialysis, hypertension, PVD status post right BKA and recent C1 fracture who presented to the ED with confusion and vomiting x1 day.  History obtained from chart as patient is unable to provide a good history at this time.  Per chart, patient was noted to have confusion and vomiting at his hemodialysis center prior to HD and was referred to the ED for further evaluation.  In the ED, patient was noted to be somnolent and difficult to arouse.  He did receive 1 dose of Narcan by the ED nurse with minimal improvement in symptoms.  Resident did reach out to patient's daughter [name]) who stated that the patient appeared well 2 days prior to his admission and had no signs of illness at that time.  Patient was being transported to his dialysis center by [employer] when he developed sudden onset of nausea and vomiting and appeared confused at the hemodialysis center and was referred to the ED for further evaluation at the time.  Of note, patient was diagnosed with a C1 fracture recently and was discharged home from the ED with a neck collar and oxycodone.  No chest pain, no palpitations, no lightheadedness, no syncope, no focal weakness, no tingling or numbness, no abdominal pain, no diarrhea, no headache, no blurry vision, no fevers or chills.  Patient currently states that he feels well and denies any complaints currently.  He is able to tell us his name and where he is but is confused to time.  PMHx, Fam Hx, and/or Soc Hx : As per resident admit note  Vitals:   11/16/21 0742 11/16/21 1128  BP: (!) 103/58 (!) 98/51  Pulse: 92 85  Resp: 16 18  Temp: 98.2 F (36.8 C) 98.7 F (37.1 C)  SpO2: 96%  100%   General: Awake, alert, oriented x2, NAD CVS: Regular rate and rhythm, normal heart sounds. Lungs: CTA bilaterally Abdomen: Soft, nontender, nondistended, normoactive bowel sounds Extremities: No edema noted, nontender to palpation Psych: Patient awake but appears confused HEENT: C-collar intact Neuro: Patient oriented x2 (not oriented to time), tends to perseverate on some words and repeat questions  Assessment and Plan: I have seen and evaluated the patient as outlined above. I agree with the formulated Assessment and Plan as detailed in the residents' note, with the following changes:   1.  Acute metabolic encephalopathy: -Patient presented to the ED with new onset nausea and vomiting as well as altered mental status in the setting of recent C1 fracture and oxycodone use. Etiology behind his encephalopathy remains uncertain at this time.  I do suspect that this is likely secondary to oxycodone use in the setting of his ESRD and should get better over the next 1 to 2 days.  Patient's mental status is already improving and he was more awake today.  Patient also noted to have an anion gap metabolic acidosis on his blood work.  It is possible the patient has an underlying infection which could have caused his altered mental status as well and an anion gap metabolic acidosis secondary to underlying lactic acidosis.  However, this appears to be less likely as patient has had no signs of an underlying infection (no fevers  or chills, no source of infection and normal white count) and appeared well recently and is showing signs of improvement off antibiotics. -Salicylate and Tylenol levels are all within normal limits -COVID and flu tests are negative -Chest x-ray with no signs of underlying infection -We will repeat BMP today to assess if anion gap metabolic acidosis is resolved post HD -We will check lactic acid to rule out lactic acidosis -If patient does develop fevers we will check blood  cultures.  He does have a central line in place and this could be a potential source of infection. -We will continue to hold oxycodone for now.  I suspect that given his ESRD this may have accumulated in his system leading to his symptoms of nausea and vomiting and confusion.  We will continue to monitor closely and hold off on further pain medications at this time. -Continue with hemodialysis per nephrology -No further work-up at this time.  We will continue to monitor closely  Aldine Contes, MD 11/18/20223:31 PM

## 2021-11-17 DIAGNOSIS — G9341 Metabolic encephalopathy: Secondary | ICD-10-CM

## 2021-11-17 DIAGNOSIS — S12000A Unspecified displaced fracture of first cervical vertebra, initial encounter for closed fracture: Secondary | ICD-10-CM | POA: Diagnosis not present

## 2021-11-17 LAB — CBC
HCT: 39.5 % (ref 39.0–52.0)
Hemoglobin: 11.8 g/dL — ABNORMAL LOW (ref 13.0–17.0)
MCH: 26.7 pg (ref 26.0–34.0)
MCHC: 29.9 g/dL — ABNORMAL LOW (ref 30.0–36.0)
MCV: 89.4 fL (ref 80.0–100.0)
Platelets: 268 10*3/uL (ref 150–400)
RBC: 4.42 MIL/uL (ref 4.22–5.81)
RDW: 15.6 % — ABNORMAL HIGH (ref 11.5–15.5)
WBC: 5.5 10*3/uL (ref 4.0–10.5)
nRBC: 0 % (ref 0.0–0.2)

## 2021-11-17 LAB — RENAL FUNCTION PANEL
Albumin: 2.4 g/dL — ABNORMAL LOW (ref 3.5–5.0)
Anion gap: 13 (ref 5–15)
BUN: 34 mg/dL — ABNORMAL HIGH (ref 8–23)
CO2: 25 mmol/L (ref 22–32)
Calcium: 8.2 mg/dL — ABNORMAL LOW (ref 8.9–10.3)
Chloride: 93 mmol/L — ABNORMAL LOW (ref 98–111)
Creatinine, Ser: 7.16 mg/dL — ABNORMAL HIGH (ref 0.61–1.24)
GFR, Estimated: 8 mL/min — ABNORMAL LOW (ref >60)
Glucose, Bld: 124 mg/dL — ABNORMAL HIGH (ref 70–99)
Phosphorus: 4.5 mg/dL (ref 2.5–4.6)
Potassium: 6.2 mmol/L — ABNORMAL HIGH (ref 3.5–5.1)
Sodium: 131 mmol/L — ABNORMAL LOW (ref 135–145)

## 2021-11-17 LAB — GLUCOSE, CAPILLARY
Glucose-Capillary: 126 mg/dL — ABNORMAL HIGH (ref 70–99)
Glucose-Capillary: 74 mg/dL (ref 70–99)
Glucose-Capillary: 174 mg/dL — ABNORMAL HIGH (ref 70–99)
Glucose-Capillary: 252 mg/dL — ABNORMAL HIGH (ref 70–99)

## 2021-11-17 LAB — HEPATITIS B SURFACE ANTIBODY, QUANTITATIVE: Hep B S AB Quant (Post): 35.2 m[IU]/mL (ref >9.9)

## 2021-11-17 MED ORDER — HYDROCERIN EX CREA
TOPICAL_CREAM | Freq: Two times a day (BID) | CUTANEOUS | Status: DC
  Administered 2021-11-17: 1 via TOPICAL
  Filled 2021-11-17: qty 113

## 2021-11-17 MED ORDER — HYDROMORPHONE HCL 1 MG/ML IJ SOLN
0.5000 mg | Freq: Once | INTRAMUSCULAR | Status: DC
  Filled 2021-11-17: qty 0.5

## 2021-11-17 MED ORDER — HEPARIN SODIUM (PORCINE) 1000 UNIT/ML DIALYSIS
4000.0000 [IU] | Freq: Once | INTRAMUSCULAR | Status: AC
  Administered 2021-11-17: 4000 [IU] via INTRAVENOUS_CENTRAL
  Filled 2021-11-17: qty 4

## 2021-11-17 MED ORDER — ACETAMINOPHEN 325 MG PO TABS
650.0000 mg | ORAL_TABLET | Freq: Once | ORAL | Status: AC
  Administered 2021-11-17: 650 mg via ORAL
  Filled 2021-11-17: qty 2

## 2021-11-17 MED ORDER — LIP MEDEX EX OINT
TOPICAL_OINTMENT | CUTANEOUS | Status: DC | PRN
  Filled 2021-11-17: qty 7

## 2021-11-17 NOTE — Progress Notes (Signed)
[facility] Progress Note   Subjective:   Patient seen and examined at bedside in dialysis. Feeling better.  Denies CP, SOB, abdominal pain, or n/v/d.  Objective Vitals:   11/17/21 0815 11/17/21 0830 11/17/21 0900 11/17/21 0930  BP: (!) 91/24 99/61 107/66 98/62  Pulse: 68 70 71 63  Resp:      Temp:      TempSrc:      SpO2:      Weight:      Height:       Physical Exam General:chronically ill appearing male in NAD with C collar, AAOx3 Heart:RRR, no mrg Lungs:CTAB, nml WOB on RA Abdomen:soft, NTND Extremities:R BKA, LLE trace edema Dialysis Access: TDC in use   [facility] Weights   11/17/21 0742  Weight: 69.4 kg    Intake/Output Summary (Last 24 hours) at 11/17/2021 1018 Last data filed at 11/16/2021 2330 Gross per 24 hour  Intake 460 ml  Output --  Net 460 ml    Additional Objective Labs: Basic Metabolic Panel: Recent Labs  Lab 11/15/21 1210 11/15/21 1219 11/16/21 0337 11/16/21 1441 11/17/21 0805  NA 127* 132*  --  137 131*  K 5.4* 4.0  --  4.9 6.2*  CL 92*  --   --  98 93*  CO2 19*  --   --  25 25  GLUCOSE 152*  --   --  57* 124*  BUN 37*  --   --  24* 34*  CREATININE 7.91*  --  5.03* 6.10* 7.16*  CALCIUM 8.4*  --   --  8.5* 8.2*  PHOS  --   --  3.4  --  4.5   Liver Function Tests: Recent Labs  Lab 11/15/21 1210 11/17/21 0805  AST 21  --   ALT 12  --   ALKPHOS 81  --   BILITOT 1.7*  --   PROT 6.8  --   ALBUMIN 2.7* 2.4*   CBC: Recent Labs  Lab 11/15/21 1210 11/15/21 1219 11/16/21 0337 11/17/21 0805  WBC 8.5  --  10.0 5.5  NEUTROABS 7.3  --   --   --   HGB 12.0* 11.9* 12.4* 11.8*  HCT 40.9 35.0* 41.5 39.5  MCV 90.7  --  88.5 89.4  PLT 239  --  280 268    CBG: Recent Labs  Lab 11/15/21 0929 11/15/21 1835 11/16/21 1805 11/16/21 2104 11/17/21 0626  GLUCAP 161* 125* 101* 118* 126*   Studies/Results: CT HEAD WO CONTRAST (5MM)  Result Date: 11/15/2021 CLINICAL DATA:  Delirium EXAM: CT HEAD WITHOUT CONTRAST  TECHNIQUE: Contiguous axial images were obtained from the base of the skull through the vertex without intravenous contrast. COMPARISON:  CT head April 15, 2021.  CT cervical spine 09/03/2021. FINDINGS: Brain: Patchy white matter hypoattenuation no evidence of acute infarction, hemorrhage, hydrocephalus, extra-axial collection or mass lesion/mass effect., nonspecific but compatible with chronic microvascular disease. Mild generalized atrophy. Vascular: No hyperdense vessel identified. Calcific intracranial atherosclerosis. Skull: Left posterior scalp and subcutaneous soft tissue thickening/stranding. No acute fracture. Sinuses/Orbits: Left maxillary sinus mucosal thickening with osteitis, suggestive of chronicity. Other: Partially imaged C1 ring fractures, better characterized on recent CT cervical spine. IMPRESSION: 1. No evidence of acute intracranial abnormality. 2. Mild chronic microvascular ischemic disease and atrophy. 3. Nonspecific left posterior scalp and subcutaneous soft tissue thickening/stranding. 4. Partially imaged C1 ring fractures, better characterized on recent CT cervical spine. 5. Chronic appearing left maxillary sinus mucosal thickening. Electronically Signed   By: Roslynn Amble  Ronnald Ramp M.D.   On: 11/15/2021 11:15    Medications:  sodium chloride     sodium chloride      calcium acetate  1,334 mg Oral TID WC   Chlorhexidine Gluconate Cloth  6 each Topical Q0600   heparin  5,000 Units Subcutaneous Q8H   insulin aspart  0-6 Units Subcutaneous TID WC   ondansetron (ZOFRAN) IV  4 mg Intravenous Once    Dialysis Orders: TTS NW   4h  400/500  64kg  2/2.5  TDC  Hep 4000    Assessment/Plan:  Lethargy - improved.  Close to baseline.   ESRD -  on HD TTS.  K elevated at 6.1, on dialysis now. Due to Thanksgiving holiday patient will run on alternate schedule this week.  Inpatient and outpatient holiday schedules differ due to unforeseen circumstances and are as follows: Normal HD schedule:  Tuesday Thursday Saturday Inpatient HD schedule at [facility]: Monday, Wednesday, Saturday Outpatient HD schedule: Tuesday, Friday, Sunday Normal HD Schedule: Monday Wednesday Friday  Hypertension/volume  - Blood pressure mostly well controlled.  On midodrine pre HD.  Volume status improving, net UF 3.5L yesterday.  Plan for additional 2.5L today.     Anemia of CKD - Hgb 11.8. No indication for ESA.  Secondary Hyperparathyroidism -  Ca and phos in goal.  Not on VDRA.  Continue binders (phoslo 2AC TID) when eating.  Nutrition - Renal diet w/fluid restrictions when eating DMT2 - per PMD Chronic neck fracture - per [place], PA-C [place] 11/17/2021,10:18 AM  LOS: 0 days

## 2021-11-17 NOTE — Plan of Care (Signed)
CHANGE IN DIALYSIS SCHEDULE DUE TO HUSAYN REIM 1956-12-23 [id number]  Patient dialysis schedule for the week of 11/18/21-11/25/21 varies from their normal schedule due to Thanksgiving Holiday.  Unfortunately due to unforeseen circumstances schedule while admitted to [facility] will be different from outpatient dialysis schedule.  Need to keep in mind for discharge planning.  Both schedules are as follows:  Normal HD schedule: Tuesday Thursday Saturday Inpatient HD schedule at [facility]: Monday, Wednesday, Saturday Outpatient HD schedule: Tuesday, Friday, Sunday  They will resume their normal schedule on 11/26/21.     Jen Mow, PA-C [facility] Pager: [phone]

## 2021-11-17 NOTE — Progress Notes (Signed)
   Subjective: Patient was evaluated at the dialysis unit laying comfortably in bed.  States he is feeling better today.  Continues to endorse ongoing neck pain.  More alert and oriented today.  Denies any shortness of breath, chest pain, abdominal pain, nausea or vomiting.  Objective:  Vital signs in last 24 hours: Vitals:   11/17/21 0815 11/17/21 0830 11/17/21 0900 11/17/21 0930  BP: (!) 91/24 99/61 107/66 98/62  Pulse: 68 70 71 63  Resp:      Temp:      TempSrc:      SpO2:      Weight:      Height:       Physical Exam  General: Pleasant but chronically ill-appearing elderly male in HD bed. NAD.  Neck: C-collar in place.  Limited ROM of the neck.  CV: RRR. No murmurs, rubs, or gallops. Trace edema of the LLE Pulmonary/chest: TDC in left chest. Lungs CTAB. Normal effort. No wheezing or rales. Abdominal: Soft, nontender, nondistended. Normal bowel sounds. Extremities: Right BKA. LLE cool to touch.  Skin: Warm and dry.  Neuro: A&Ox3. Moves all extremities.  No focal deficits. Psych: Normal mood and affect   Assessment/Plan:  Principal Problem:   Acute metabolic encephalopathy  Acute metabolic encephalopathy Patient's mental status very much improved this morning during his HD session.  Very close to his baseline. Still unclear if patient's encephalopathy is due to uremia versus drug-induced.  We will continue to monitor his mental status and hold off on Oxy for now.  PT recommended SNF for acute rehab. --Pending discharges none --Avoid oxycodone, use Dilaudid as needed for pain control --Trend kidney function  ESRD on HD TTS Anion gap metabolic acidosis, improved Anemia of chronic disease Patient receiving HD today during encounter.  Volume status improved.  K+ elevated to 6.2 with sodium of 131 this morning however patient is getting dialysis.  No lactic acidosis.  AGMA likely due to uremia.  Patient's hemoglobin is stable at 11.8, no indication for ESA per nephro.  Per  nephrology, patient's inpatient HD will be MWS and outpatient HD this week will be TFS due to the holiday. --Nephro following, appreciate recs --Continue HD MWS while inpatient, TFS in the outpatient --Trend kidney function, replete electrolytes --Strict I&O's, daily weights. --Pending SNF placement  T2DM, insulin-dependent with complications O9G of 2.9% 11/10.  CBGs in the 110s to 120s.  Patient now close to baseline mental status and eating. --Continue SSI with meals.  Recent C1 fracture Patient continues to be on c-collar. He endorses some neck pain with no headache, dizziness, tingling or numbness. Plan to avoid oxycodone for now we will use Dilaudid for pain control. --Continue c-collar --Dilaudid as needed for pain  Prior to Admission Living Arrangement: ALF Anticipated Discharge Location: SNF Barriers to Discharge: Pending SNF placement Dispo: Anticipated discharge in approximately 1-2 day(s).   Lacinda Axon, MD 11/17/2021, 10:37 AM Pager: [phone] After 5pm on weekdays and 1pm on weekends: On Call pager [phone]

## 2021-11-18 DIAGNOSIS — G9341 Metabolic encephalopathy: Secondary | ICD-10-CM | POA: Diagnosis not present

## 2021-11-18 DIAGNOSIS — S12000A Unspecified displaced fracture of first cervical vertebra, initial encounter for closed fracture: Secondary | ICD-10-CM | POA: Diagnosis not present

## 2021-11-18 LAB — GLUCOSE, CAPILLARY
Glucose-Capillary: 149 mg/dL — ABNORMAL HIGH (ref 70–99)
Glucose-Capillary: 221 mg/dL — ABNORMAL HIGH (ref 70–99)
Glucose-Capillary: 178 mg/dL — ABNORMAL HIGH (ref 70–99)
Glucose-Capillary: 200 mg/dL — ABNORMAL HIGH (ref 70–99)

## 2021-11-18 LAB — CBC
HCT: 39.2 % (ref 39.0–52.0)
Hemoglobin: 11.6 g/dL — ABNORMAL LOW (ref 13.0–17.0)
MCH: 26.3 pg (ref 26.0–34.0)
MCHC: 29.6 g/dL — ABNORMAL LOW (ref 30.0–36.0)
MCV: 88.9 fL (ref 80.0–100.0)
Platelets: 260 10*3/uL (ref 150–400)
RBC: 4.41 MIL/uL (ref 4.22–5.81)
RDW: 15.4 % (ref 11.5–15.5)
WBC: 4.8 10*3/uL (ref 4.0–10.5)
nRBC: 0 % (ref 0.0–0.2)

## 2021-11-18 LAB — RENAL FUNCTION PANEL
Albumin: 2.7 g/dL — ABNORMAL LOW (ref 3.5–5.0)
Anion gap: 11 (ref 5–15)
BUN: 19 mg/dL (ref 8–23)
CO2: 26 mmol/L (ref 22–32)
Calcium: 8.5 mg/dL — ABNORMAL LOW (ref 8.9–10.3)
Chloride: 95 mmol/L — ABNORMAL LOW (ref 98–111)
Creatinine, Ser: 5.47 mg/dL — ABNORMAL HIGH (ref 0.61–1.24)
GFR, Estimated: 11 mL/min — ABNORMAL LOW (ref >60)
Glucose, Bld: 153 mg/dL — ABNORMAL HIGH (ref 70–99)
Phosphorus: 3.2 mg/dL (ref 2.5–4.6)
Potassium: 4 mmol/L (ref 3.5–5.1)
Sodium: 132 mmol/L — ABNORMAL LOW (ref 135–145)

## 2021-11-18 MED ORDER — INSULIN GLARGINE-YFGN 100 UNIT/ML ~~LOC~~ SOLN
5.0000 [IU] | Freq: Every day | SUBCUTANEOUS | Status: DC
  Administered 2021-11-18 – 2021-11-20 (×2): 5 [IU] via SUBCUTANEOUS
  Filled 2021-11-18 (×4): qty 0.05

## 2021-11-18 MED ORDER — HYDROMORPHONE HCL 2 MG PO TABS
2.0000 mg | ORAL_TABLET | Freq: Four times a day (QID) | ORAL | Status: DC | PRN
  Administered 2021-11-18 – 2021-11-21 (×8): 2 mg via ORAL
  Filled 2021-11-18 (×8): qty 1

## 2021-11-18 NOTE — Progress Notes (Signed)
   Subjective: Patient was evaluated at the bedside talking on the phone. States dialysis went well yesterday. Feels like he is much better compared to when he came in. Still does not remember why he was vomiting before admission.  Denied any abdominal pain, nausea, vomiting, shortness of breath or chest pain.  Objective:  Vital signs in last 24 hours: Vitals:   11/17/21 1929 11/17/21 2337 11/18/21 0343 11/18/21 0900  BP: (!) 95/57 124/73 106/66 110/66  Pulse: 83 92 77 84  Resp: 18 19 18 20   Temp: 98.7 F (37.1 C) 98.2 F (36.8 C) 98.4 F (36.9 C) 98.7 F (37.1 C)  TempSrc: Oral Oral Oral Oral  SpO2: 98% 95% 100%   Weight:      Height:       Physical Exam  General: Pleasant elderly man laying in bed.  NAD. Neck: C-collar in place. CV: RRR. No murmurs, rubs, or gallops. Trace edema of the LLE Pulmonary/chest: TDC in left chest.  Lungs CTAB.  No wheezing or rales. Abdominal: Soft, nontender, nondistended. Normal bowel sounds. Extremities: Right BKA. LLE cool to touch.  Skin: Warm and dry. Healing wound on left knee. Neuro: A&Ox3. Moves all extremities.  No focal deficits. Psych: Normal mood and affect   Assessment/Plan:  Principal Problem:   Acute metabolic encephalopathy  Acute metabolic encephalopathy, resolved Patient seen back to his baseline.  He is alert and oriented x3 and able to have conversation about his admission. States he does not understand why he was vomiting so much before his admission but knows that he feels much better and more like himself today compared to when he first came in. Patient previously lived in assisted living facility prior to admission.  Patient medically ready for discharge. --Continue to avoid oxycodone --Pending discharge to SNF, SW working on placement  ESRD on HD TTS Anion gap metabolic acidosis, resolved Anemia of chronic disease, stable Patient tolerated dialysis well yesterday.  No electrolyte abnormalities this morning.  BP  remained stable.  Denies any nausea or vomiting.  Plan for HD tomorrow. --Nephro following, appreciate --Continue HD MWS while inpatient, TFS in the outpatient --Trend kidney function, replete electrolytes --Continue midodrine 10 mg during HD --Strict I&O's, daily weights. --Pending SNF placement  T2DM, insulin-dependent with complications M8U of 1.3% during this admission.  Blood sugars slightly elevated overnight to 252 but improved to 149 this morning. On insulin glargine 10 units at bedtime at home.  --Start Semglee 5 units at bedtime --Continue SSI with meal  Recent C1 fracture Endorses worsening pain overnight patient was started on as needed Dilaudid.  Pain improved this morning. --Continue c-collar --Continue Dilaudid 2 mg q6hr prn for moderate, severe pain  Prior to Admission Living Arrangement: ALF Anticipated Discharge Location: SNF Barriers to Discharge: Pending SNF placement Dispo: Anticipated discharge in approximately 1-2 day(s).   Lacinda Axon, MD 11/18/2021, 11:49 AM Pager: [phone] After 5pm on weekdays and 1pm on weekends: On Call pager [phone]

## 2021-11-18 NOTE — Progress Notes (Signed)
[facility] Progress Note   Subjective:  Patient seen and examined at bedside.  Sitting up eating breakfast.  Reports tolerated dialysis well.  Continues to remove C collar because uncomfortable, encouraged to well continuously.  A little agitated initially this AM but mood improved by end of conversation.    Objective Vitals:   11/17/21 1929 11/17/21 2337 11/18/21 0343 11/18/21 0900  BP: (!) 95/57 124/73 106/66 110/66  Pulse: 83 92 77 84  Resp: 18 19 18 20   Temp: 98.7 F (37.1 C) 98.2 F (36.8 C) 98.4 F (36.9 C) 98.7 F (37.1 C)  TempSrc: Oral Oral Oral Oral  SpO2: 98% 95% 100%   Weight:      Height:       Physical Exam General:chronically ill appearing male in NAD Heart:RRR, no mrg Lungs:CTAB, nml WOB Abdomen:soft, NTND Extremities:trace LLE edema, R BKA Dialysis Access: [facility]   [facility] Weights   11/17/21 0742 11/17/21 1143  Weight: 69.4 kg 66.9 kg    Intake/Output Summary (Last 24 hours) at 11/18/2021 1030 Last data filed at 11/18/2021 0035 Gross per 24 hour  Intake 1080 ml  Output 2500 ml  Net -1420 ml    Additional Objective Labs: Basic Metabolic Panel: Recent Labs  Lab 11/16/21 0337 11/16/21 1441 11/17/21 0805 11/18/21 0633  NA  --  137 131* 132*  K  --  4.9 6.2* 4.0  CL  --  98 93* 95*  CO2  --  25 25 26   GLUCOSE  --  57* 124* 153*  BUN  --  24* 34* 19  CREATININE 5.03* 6.10* 7.16* 5.47*  CALCIUM  --  8.5* 8.2* 8.5*  PHOS 3.4  --  4.5 3.2   Liver Function Tests: Recent Labs  Lab 11/15/21 1210 11/17/21 0805 11/18/21 0633  AST 21  --   --   ALT 12  --   --   ALKPHOS 81  --   --   BILITOT 1.7*  --   --   PROT 6.8  --   --   ALBUMIN 2.7* 2.4* 2.7*   CBC: Recent Labs  Lab 11/15/21 1210 11/15/21 1219 11/16/21 0337 11/17/21 0805 11/18/21 0633  WBC 8.5  --  10.0 5.5 4.8  NEUTROABS 7.3  --   --   --   --   HGB 12.0*   < > 12.4* 11.8* 11.6*  HCT 40.9   < > 41.5 39.5 39.2  MCV 90.7  --  88.5 89.4 88.9  PLT 239  --  280  268 260   < > = values in this interval not displayed.   CBG: Recent Labs  Lab 11/17/21 0626 11/17/21 1250 11/17/21 1558 11/17/21 2116 11/18/21 0605  GLUCAP 126* 74 174* 252* 149*    Medications:   calcium acetate  1,334 mg Oral TID WC   Chlorhexidine Gluconate Cloth  6 each Topical Q0600   heparin  5,000 Units Subcutaneous Q8H   hydrocerin   Topical BID   insulin aspart  0-6 Units Subcutaneous TID WC   ondansetron (ZOFRAN) IV  4 mg Intravenous Once    Dialysis Orders: TTS NW   4h  400/500  64kg  2/2.5  TDC  Hep 4000    Assessment/Plan:  Lethargy - improved.  Close to baseline.   ESRD -  on HD TTS.  K 4.0 today. HD tomorrow. Due to Thanksgiving holiday patient will run on alternate schedule this week.  Inpatient and outpatient holiday schedules differ due to  unforeseen circumstances and are as follows: Normal HD schedule: Tuesday Thursday Saturday Inpatient HD schedule at [facility]: Monday, Wednesday, Saturday Outpatient HD schedule: Tuesday, Friday, Sunday Normal HD Schedule: Monday Wednesday Friday  Hypertension/volume  - Blood pressure well controlled.  On midodrine pre HD.  Volume status improved.  UF as tolerated.     Anemia of CKD - Hgb 11.8. No indication for ESA.  Secondary Hyperparathyroidism -  Ca and phos in goal.  Not on VDRA.  Continue binders (phoslo 2AC TID) when eating.  Nutrition - Renal diet w/fluid restrictions when eating DMT2 - per PMD Chronic neck fracture - per pmd Dispo - pending SNF placement  Jen Mow, PA-C [facility] 11/18/2021,10:30 AM  LOS: 0 days

## 2021-11-18 NOTE — NC FL2 (Addendum)
[place] MEDICAID FL2 LEVEL OF CARE SCREENING TOOL     IDENTIFICATION  Patient Name: Jose Robinson Birthdate: [birth year] Sex: male Admission Date (Current Location): 11/15/2021  [facility] and [place] Number:  Herbalist and Address:  [facility] [place], [place] [address] [place], [place] [address]      Provider Number: [id number]  Attending Physician Name and Address:  Aldine Contes, MD  Relative Name and Phone Number:  Rhet Rorke, [phone]    Current Level of Care: Hospital Recommended Level of Care: ALF Prior Approval Number:    Date Approved/Denied:   PASRR Number: [id number]  Discharge Plan: SNF    Current Diagnoses: Patient Active Problem List   Diagnosis Date Noted   Acute metabolic encephalopathy 09/32/3557   Diabetes mellitus type 2, uncontrolled, with complications 32/20/2542   Diabetic nephropathy ([place]) 70/62/3762   Chronic systolic CHF (congestive heart failure) ([place]) 04/16/2021   Hypoglycemia 04/15/2021   Hyperglycemia 04/15/2021   DKA (diabetic ketoacidosis) ([place]) 01/24/2021   COVID-19 virus infection 01/24/2021   Syncope 01/24/2021   ESRD (end stage renal disease) ([place]) 01/24/2021   Congestive heart failure (CHF) ([place]) 01/24/2021   Hypotension 01/24/2021   Shock ([place]) 01/24/2021    Orientation RESPIRATION BLADDER Height & Weight     Self, Place  Normal Continent Weight: 147 lb 7.8 oz (66.9 kg) Height:  5\' 11"  (180.3 cm)  BEHAVIORAL SYMPTOMS/MOOD NEUROLOGICAL BOWEL NUTRITION STATUS      Continent Diet (See DC summary)  AMBULATORY STATUS COMMUNICATION OF NEEDS Skin   Extensive Assist Verbally Surgical wounds (R BKA, Finger amputation)                       Personal Care Assistance Level of Assistance  Bathing, Feeding, Dressing Bathing Assistance: Limited assistance Feeding assistance: Independent Dressing Assistance: Limited assistance     Functional Limitations Info  Sight, Hearing, Speech  Sight Info: Adequate Hearing Info: Adequate Speech Info: Adequate    SPECIAL CARE FACTORS FREQUENCY  PT (By licensed PT), OT (By licensed OT)     PT Frequency: 5x week OT Frequency: 5x week            Contractures Contractures Info: Not present    Additional Factors Info  Code Status, Allergies Code Status Info: Full Allergies Info: Acetaminophen   Prednisone   Ivp Dye (Iodinated Diagnostic Agents)           Current Medications (11/18/2021):  This is the current hospital active medication list Current Facility-Administered Medications  Medication Dose Route Frequency Provider Last Rate Last Admin   calcium acetate (PHOSLO) capsule 1,334 mg  1,334 mg Oral TID WC Timothy Lasso, MD   1,334 mg at 11/18/21 0904   Chlorhexidine Gluconate Cloth 2 % PADS 6 each  6 each Topical Q0600 Penninger, Ria Comment, [place]   6 each at 11/18/21 0540   heparin injection 5,000 Units  5,000 Units Subcutaneous Q8H Lacinda Axon, MD   5,000 Units at 11/18/21 8315   hydrocerin (EUCERIN) cream   Topical BID Orvis Brill, MD   Given at 11/18/21 0908   HYDROmorphone (DILAUDID) tablet 2 mg  2 mg Oral Q6H PRN Rehman, Areeg N, DO   2 mg at 11/18/21 0943   insulin aspart (novoLOG) injection 0-6 Units  0-6 Units Subcutaneous TID WC Timothy Lasso, MD   1 Units at 11/17/21 1720   insulin glargine-yfgn (SEMGLEE) injection 5 Units  5 Units Subcutaneous QHS Lacinda Axon,  MD       lip balm (CARMEX) ointment   Topical PRN Aldine Contes, MD       midodrine (PROAMATINE) tablet 10 mg  10 mg Oral Q dialysis Penninger, [place], [place]   10 mg at 11/17/21 8295   ondansetron (ZOFRAN) injection 4 mg  4 mg Intravenous Once Wynona Dove A, DO       ondansetron (ZOFRAN) tablet 4 mg  4 mg Oral Q6H PRN Lacinda Axon, MD       Or   ondansetron (ZOFRAN) injection 4 mg  4 mg Intravenous Q6H PRN Lacinda Axon, MD       senna-docusate (Senokot-S) tablet 1 tablet  1 tablet Oral QHS PRN Lacinda Axon, MD         Discharge Medications: Please see discharge summary for a list of discharge medications.  Relevant Imaging Results:  Relevant Lab Results:   Additional Information HD TTS -SSN [id number]  Coralee Pesa, LCSWA

## 2021-11-18 NOTE — TOC Progression Note (Addendum)
Transition of Care [facility]) - Progression Note    Patient Details  Name: JAHEEM HEDGEPATH MRN: [id number] Date of Birth: 1956-07-14  Transition of Care [facility]) CM/SW [place], [place] Phone Number: 11/18/2021, 11:34 AM  Clinical Narrative:    12:10 CSW received a return call from pt's daughter. She noted pt has been to several facilities before, including [facility]. She would prefer if possible for pt to return to [facility], as she doesn't believe he is that far from Baseline. CSW noted [facility] may need to come do an assessment to determine if he can return. She agreed to CSW completing SNF workup in case pt needs SNF. Pt has had covid shots and several boosters. She states a preference for pt to stay in [place], and noted [place] . CSW will complete workup and faxout, TOC will need to reach out to [facility] tomorrow to see if they can take him back at DC.  CSW noted that pt has SNF recommendations at this time. CSW noted pt is disoriented and attempted to call daughter to discuss discharge planning, a voicemail was left. CSW attempted to call [facility] to collect collateral information, was unable to leave a VM. TOC will continue to follow for DC planning needs.   Expected Discharge Plan: Assisted Living Barriers to Discharge: Continued Medical Work up  Expected Discharge Plan and Services Expected Discharge Plan: Assisted Living In-house Referral: Clinical Social Work     Living arrangements for the past 2 months: [place]                                       Social Determinants of Health (SDOH) Interventions    Readmission Risk Interventions No flowsheet data found.

## 2021-11-19 DIAGNOSIS — G9341 Metabolic encephalopathy: Secondary | ICD-10-CM | POA: Diagnosis not present

## 2021-11-19 DIAGNOSIS — S12000A Unspecified displaced fracture of first cervical vertebra, initial encounter for closed fracture: Secondary | ICD-10-CM | POA: Diagnosis not present

## 2021-11-19 LAB — RENAL FUNCTION PANEL
Albumin: 2.6 g/dL — ABNORMAL LOW (ref 3.5–5.0)
Anion gap: 12 (ref 5–15)
BUN: 34 mg/dL — ABNORMAL HIGH (ref 8–23)
CO2: 27 mmol/L (ref 22–32)
Calcium: 8.4 mg/dL — ABNORMAL LOW (ref 8.9–10.3)
Chloride: 94 mmol/L — ABNORMAL LOW (ref 98–111)
Creatinine, Ser: 7.52 mg/dL — ABNORMAL HIGH (ref 0.61–1.24)
GFR, Estimated: 8 mL/min — ABNORMAL LOW (ref >60)
Glucose, Bld: 117 mg/dL — ABNORMAL HIGH (ref 70–99)
Phosphorus: 3.7 mg/dL (ref 2.5–4.6)
Potassium: 4.1 mmol/L (ref 3.5–5.1)
Sodium: 133 mmol/L — ABNORMAL LOW (ref 135–145)

## 2021-11-19 LAB — CBC
HCT: 38.3 % — ABNORMAL LOW (ref 39.0–52.0)
Hemoglobin: 11.5 g/dL — ABNORMAL LOW (ref 13.0–17.0)
MCH: 26.8 pg (ref 26.0–34.0)
MCHC: 30 g/dL (ref 30.0–36.0)
MCV: 89.3 fL (ref 80.0–100.0)
Platelets: 252 10*3/uL (ref 150–400)
RBC: 4.29 MIL/uL (ref 4.22–5.81)
RDW: 15.2 % (ref 11.5–15.5)
WBC: 4.1 10*3/uL (ref 4.0–10.5)
nRBC: 0 % (ref 0.0–0.2)

## 2021-11-19 LAB — GLUCOSE, CAPILLARY
Glucose-Capillary: 104 mg/dL — ABNORMAL HIGH (ref 70–99)
Glucose-Capillary: 134 mg/dL — ABNORMAL HIGH (ref 70–99)
Glucose-Capillary: 99 mg/dL (ref 70–99)
Glucose-Capillary: 104 mg/dL — ABNORMAL HIGH (ref 70–99)

## 2021-11-19 MED ORDER — HEPARIN SODIUM (PORCINE) 1000 UNIT/ML IJ SOLN
4000.0000 [IU] | Freq: Once | INTRAMUSCULAR | Status: AC
  Administered 2021-11-19: 4000 [IU] via INTRAVENOUS
  Filled 2021-11-19: qty 4

## 2021-11-19 MED ORDER — GELATIN ABSORBABLE 12-7 MM EX MISC
CUTANEOUS | Status: AC
  Filled 2021-11-19: qty 1

## 2021-11-19 NOTE — Progress Notes (Signed)
   Subjective: I seen and evaluated Jose Robinson at bedside.  He was currently receiving hemodialysis.  He states that he is feeling fine.  He states that he is at his baseline.  Objective:  Vital signs in last 24 hours: Vitals:   11/17/21 2337 11/18/21 0343 11/18/21 0900 11/19/21 0357  BP: 124/73 106/66 110/66 124/71  Pulse: 92 77 84 77  Resp: 19 18 20 20   Temp: 98.2 F (36.8 C) 98.4 F (36.9 C) 98.7 F (37.1 C) 98.5 F (36.9 C)  TempSrc: Oral Oral Oral Oral  SpO2: 95% 100%  100%  Weight:      Height:       CBC Latest Ref Rng & Units 11/18/2021 11/17/2021 11/16/2021  WBC 4.0 - 10.5 K/uL 4.8 5.5 10.0  Hemoglobin 13.0 - 17.0 g/dL 11.6(L) 11.8(L) 12.4(L)  Hematocrit 39.0 - 52.0 % 39.2 39.5 41.5  Platelets 150 - 400 K/uL 260 268 280   BMP Latest Ref Rng & Units 11/18/2021 11/17/2021 11/16/2021  Glucose 70 - 99 mg/dL 153(H) 124(H) 57(L)  BUN 8 - 23 mg/dL 19 34(H) 24(H)  Creatinine 0.61 - 1.24 mg/dL 5.47(H) 7.16(H) 6.10(H)  Sodium 135 - 145 mmol/L 132(L) 131(L) 137  Potassium 3.5 - 5.1 mmol/L 4.0 6.2(H) 4.9  Chloride 98 - 111 mmol/L 95(L) 93(L) 98  CO2 22 - 32 mmol/L 26 25 25   Calcium 8.9 - 10.3 mg/dL 8.5(L) 8.2(L) 8.5(L)     Assessment/Plan:  Principal Problem:   Acute metabolic encephalopathy  Acute metabolic encephalopathy, resolved Patient back at baseline. Patient states he lives in an assisted living, [facility] and he has PT/OT sessions currently. He states he receives transportation to and from his appointments. Patient medically ready for discharge. Patient is to return to his assisted living, [facility]. And receive PT/OT through their facility, per patient. --Continue to avoid oxycodone  ESRD on HD TTS Anion gap metabolic acidosis, resolved Anemia of chronic disease, stable Patient tolerated dialysis well today.  No electrolyte abnormalities this morning.  BP remained stable.  Denies any nausea or vomiting.  --Nephro following, appreciate --Continue HD  MWS while inpatient, TFS in the outpatient --Trend kidney function, replete electrolytes --Continue midodrine 10 mg during HD --Strict I&O's, daily weights.   T2DM, insulin-dependent with complications B8G of 6.6% during this admission. CBGs readings acceptable <150  On insulin glargine 10 units at bedtime at home.  --Start Semglee 5 units at bedtime --Continue SSI with meal   Recent C1 fracture Patient states his pain is relieved by Dilaudid. --Continue c-collar --Continue Dilaudid 2 mg q6hr prn for moderate, severe pain    Prior to Admission Living Arrangement: Anticipated Discharge Location: Barriers to Discharge: Dispo: Anticipated discharge in approximately 1-2 day(s).   Timothy Lasso, MD 11/19/2021, 7:18 AM Pager: [phone] After 5pm on weekdays and 1pm on weekends: On Call pager [phone]

## 2021-11-19 NOTE — Progress Notes (Signed)
Request to IR for leaking HD catheter - per floor staff patient noted to have blood leaking from catheter this morning. Per patient there was some leaking today which stopped quickly, he has never had this happen before but thinks he was told there was a problem with the clamp and that it was fine now. He completed HD today without issue.  Catheter assessed at bedside, both clamps in tact and functional, catheters are both heparin locked without leakage noted on gown - unable to express any liquid from the catheter. No active bleeding or drainage from insertion site.  No IR needs at this time however if leaking persists consider HD catheter exchange (can be done outpatient if patient is for d/c and catheter is functional otherwise).  Please call IR with questions or concerns.  Candiss Norse, PA-C

## 2021-11-19 NOTE — Procedures (Signed)
Patient was seen on dialysis and the procedure was supervised.  BFR 400  Via TDC BP is  126/73.   Patient appears to be tolerating treatment well  Louis Meckel 11/19/2021

## 2021-11-19 NOTE — Progress Notes (Signed)
PT Cancellation Note  Patient Details Name: Jose Robinson MRN: [id number] DOB: 12-22-1956   Cancelled Treatment:    Reason Eval/Treat Not Completed: Patient at procedure or test/unavailable. Pt off floor at HD. PT to return as able to progress mobility.  Kittie Plater, PT, DPT Acute Rehabilitation Services Pager #: [phone] Office #: [phone]    Berline Lopes 11/19/2021, 8:19 AM

## 2021-11-19 NOTE — Plan of Care (Signed)
  Problem: Activity: Goal: Risk for activity intolerance will decrease Outcome: Progressing   Problem: Nutrition: Goal: Adequate nutrition will be maintained Outcome: Progressing Note: Much encouragement and assist to get him to eat.  Has to be fed at times   Problem: Coping: Goal: Level of anxiety will decrease Outcome: Progressing   Problem: Pain Managment: Goal: General experience of comfort will improve Outcome: Progressing   Problem: Safety: Goal: Ability to remain free from injury will improve Outcome: Progressing

## 2021-11-19 NOTE — Progress Notes (Signed)
[facility] Progress Note   Subjective:  Patient seen in HD -  asks me if his nephew is behind me?  Still seems some confused -  looking to discharge to SNF-  also his [facility] does not clamp completely Objective Vitals:   11/19/21 0756 11/19/21 0805 11/19/21 0830 11/19/21 0900  BP: 115/70 (!) 107/58 100/66 105/68  Pulse: 83 77 76 85  Resp: 16 16 14 15   Temp: (!) 96.6 F (35.9 C)     TempSrc: Oral     SpO2: 97%     Weight:      Height:       Physical Exam General:chronically ill appearing male in NAD Heart:RRR, no mrg Lungs:CTAB, nml WOB Abdomen:soft, NTND Extremities:trace LLE edema, R BKA Dialysis Access: [facility]   Filed Weights   11/17/21 0742 11/17/21 1143  Weight: 69.4 kg 66.9 kg   No intake or output data in the 24 hours ending 11/19/21 0933   Additional Objective Labs: Basic Metabolic Panel: Recent Labs  Lab 11/17/21 0805 11/18/21 0633 11/19/21 0820  NA 131* 132* 133*  K 6.2* 4.0 4.1  CL 93* 95* 94*  CO2 25 26 27   GLUCOSE 124* 153* 117*  BUN 34* 19 34*  CREATININE 7.16* 5.47* 7.52*  CALCIUM 8.2* 8.5* 8.4*  PHOS 4.5 3.2 3.7   Liver Function Tests: Recent Labs  Lab 11/15/21 1210 11/17/21 0805 11/18/21 0633 11/19/21 0820  AST 21  --   --   --   ALT 12  --   --   --   ALKPHOS 81  --   --   --   BILITOT 1.7*  --   --   --   PROT 6.8  --   --   --   ALBUMIN 2.7* 2.4* 2.7* 2.6*   CBC: Recent Labs  Lab 11/15/21 1210 11/15/21 1219 11/16/21 0337 11/17/21 0805 11/18/21 0633 11/19/21 0820  WBC 8.5  --  10.0 5.5 4.8 4.1  NEUTROABS 7.3  --   --   --   --   --   HGB 12.0*   < > 12.4* 11.8* 11.6* 11.5*  HCT 40.9   < > 41.5 39.5 39.2 38.3*  MCV 90.7  --  88.5 89.4 88.9 89.3  PLT 239  --  280 268 260 252   < > = values in this interval not displayed.   CBG: Recent Labs  Lab 11/18/21 0605 11/18/21 1211 11/18/21 1615 11/18/21 2113 11/19/21 0621  GLUCAP 149* 221* 178* 200* 104*    Medications:   calcium acetate  1,334 mg Oral TID  WC   Chlorhexidine Gluconate Cloth  6 each Topical Q0600   heparin  5,000 Units Subcutaneous Q8H   heparin sodium (porcine)  4,000 Units Intravenous Once   hydrocerin   Topical BID   insulin aspart  0-6 Units Subcutaneous TID WC   insulin glargine-yfgn  5 Units Subcutaneous QHS   ondansetron (ZOFRAN) IV  4 mg Intravenous Once    Dialysis Orders: TTS NW   4h  400/500  64kg  2/2.5  TDC  Hep 4000    Assessment/Plan:  Lethargy - improved.  Close to baseline. But still seems some confused-  dont know baseline   ESRD -  on HD TTS.  K 4.0 today. HD today. Due to Thanksgiving holiday patient will run on alternate schedule this week.  Inpatient and outpatient holiday schedules differ due to unforeseen circumstances and are as follows: Normal HD schedule: Tuesday  Thursday Saturday Inpatient HD schedule at [facility]: Monday, Wednesday, Saturday Outpatient HD schedule: Tuesday, Friday, Sunday Normal HD Schedule: Monday Wednesday Friday  Hypertension/volume  - Blood pressure well controlled.  On midodrine pre HD.  Volume status improved.  UF as tolerated.     Anemia of CKD - Hgb 11.8. No indication for ESA.  Secondary Hyperparathyroidism -  Ca and phos in goal.  Not on VDRA.  Continue binders (phoslo 2AC TID) when eating.  Nutrition - Renal diet w/fluid restrictions when eating DMT2 - per PMD Chronic neck fracture - per pmd Dispo - pending SNF placement  [facility] 11/19/2021,9:33 AM  LOS: 0 days

## 2021-11-19 NOTE — Progress Notes (Signed)
Catheter leaking when closed. Physician Assistant notified.

## 2021-11-19 NOTE — TOC Progression Note (Addendum)
Transition of Care [facility]) - Progression Note    Patient Details  Name: Jose Robinson MRN: [id number] Date of Birth: 03-11-1956  Transition of Care [facility]) CM/SW [place], [place] Phone Number: 11/19/2021, 1:29 PM  Clinical Narrative:    4:00 CSW attempted to call again and the facility did not answer. Called again and attempted the Director, was unable to leave a voicemail. Attempted again and was told the manager would be reaching out shortly.  CSW called back again and was told the # was just now given to the manager and CSW should be receiving a call back shortly. CSW reached out to leadership for assistance. The recommendation was to contact [place] [facility] [place] [phone]) for assistance, a voicemail was left.  CSW was notified that pt could be discharged back to his ALF if the facility can take him back. CSW spoke with [facility] and was requested to send an FL2. CSW sent it and followed up an hour later, it had not been reviewed yet, and was told a Freight forwarder would need to review it and follow up. CSW waited an hour and then called and was told the paperwork had not been given to the manager and they were giving it now. CSW called again later and the manager was still not available. CSW will continue to follow up with the facility for discharge disposition.   Expected Discharge Plan: Assisted Living Barriers to Discharge: Continued Medical Work up  Expected Discharge Plan and Services Expected Discharge Plan: Assisted Living In-house Referral: Clinical Social Work     Living arrangements for the past 2 months: [place]                                       Social Determinants of Health (SDOH) Interventions    Readmission Risk Interventions No flowsheet data found.

## 2021-11-20 ENCOUNTER — Observation Stay (HOSPITAL_COMMUNITY): Payer: Medicare (Managed Care)

## 2021-11-20 DIAGNOSIS — G9341 Metabolic encephalopathy: Secondary | ICD-10-CM | POA: Diagnosis not present

## 2021-11-20 DIAGNOSIS — S12000A Unspecified displaced fracture of first cervical vertebra, initial encounter for closed fracture: Secondary | ICD-10-CM | POA: Diagnosis not present

## 2021-11-20 LAB — GLUCOSE, CAPILLARY
Glucose-Capillary: 90 mg/dL (ref 70–99)
Glucose-Capillary: 221 mg/dL — ABNORMAL HIGH (ref 70–99)
Glucose-Capillary: 257 mg/dL — ABNORMAL HIGH (ref 70–99)
Glucose-Capillary: 252 mg/dL — ABNORMAL HIGH (ref 70–99)

## 2021-11-20 LAB — SARS CORONAVIRUS 2 (TAT 6-24 HRS): SARS Coronavirus 2: NEGATIVE

## 2021-11-20 MED ORDER — CHLORHEXIDINE GLUCONATE CLOTH 2 % EX PADS: 6.0000 | MEDICATED_PAD | Freq: Every day | CUTANEOUS | Status: DC

## 2021-11-20 MED ORDER — MENTHOL 3 MG MT LOZG
1.0000 | LOZENGE | OROMUCOSAL | Status: DC | PRN
  Administered 2021-11-20: 3 mg via ORAL
  Filled 2021-11-20: qty 9

## 2021-11-20 MED ORDER — MIDODRINE HCL 5 MG PO TABS
5.0000 mg | ORAL_TABLET | Freq: Once | ORAL | Status: AC
  Administered 2021-11-20: 5 mg via ORAL
  Filled 2021-11-20: qty 1

## 2021-11-20 NOTE — Progress Notes (Signed)
[facility] Progress Note   Subjective:   s/p HD yesterday-  volume removal not noted.  Working on placement.  Appreciate IR looking at [facility] yesterday-  no dysfunction found.  He has no new complaints today except he didn't get breakfast yet   Objective Vitals:   11/20/21 0026 11/20/21 0313 11/20/21 0500 11/20/21 0742  BP: 102/64 102/70  95/63  Pulse: 78 68  82  Resp: 16 20  18   Temp: 98.4 F (36.9 C) 98.4 F (36.9 C)  98.5 F (36.9 C)  TempSrc:  Oral  Oral  SpO2: 99% 97%  91%  Weight:   65 kg   Height:       Physical Exam General:chronically ill appearing male in NAD Heart:RRR, no mrg Lungs:CTAB, nml WOB Abdomen:soft, NTND Extremities:trace LLE edema, R BKA Dialysis Access: [facility]   Filed Weights   11/19/21 0756 11/19/21 1230 11/20/21 0500  Weight: 68.2 kg 64.1 kg 65 kg    Intake/Output Summary (Last 24 hours) at 11/20/2021 0843 Last data filed at 11/19/2021 1716 Gross per 24 hour  Intake 240 ml  Output --  Net 240 ml     Additional Objective Labs: Basic Metabolic Panel: Recent Labs  Lab 11/17/21 0805 11/18/21 0633 11/19/21 0820  NA 131* 132* 133*  K 6.2* 4.0 4.1  CL 93* 95* 94*  CO2 25 26 27   GLUCOSE 124* 153* 117*  BUN 34* 19 34*  CREATININE 7.16* 5.47* 7.52*  CALCIUM 8.2* 8.5* 8.4*  PHOS 4.5 3.2 3.7   Liver Function Tests: Recent Labs  Lab 11/15/21 1210 11/17/21 0805 11/18/21 0633 11/19/21 0820  AST 21  --   --   --   ALT 12  --   --   --   ALKPHOS 81  --   --   --   BILITOT 1.7*  --   --   --   PROT 6.8  --   --   --   ALBUMIN 2.7* 2.4* 2.7* 2.6*   CBC: Recent Labs  Lab 11/15/21 1210 11/15/21 1219 11/16/21 0337 11/17/21 0805 11/18/21 0633 11/19/21 0820  WBC 8.5  --  10.0 5.5 4.8 4.1  NEUTROABS 7.3  --   --   --   --   --   HGB 12.0*   < > 12.4* 11.8* 11.6* 11.5*  HCT 40.9   < > 41.5 39.5 39.2 38.3*  MCV 90.7  --  88.5 89.4 88.9 89.3  PLT 239  --  280 268 260 252   < > = values in this interval not displayed.    CBG: Recent Labs  Lab 11/19/21 0621 11/19/21 1311 11/19/21 1545 11/19/21 2128 11/20/21 0602  GLUCAP 104* 134* 99 104* 90    Medications:   calcium acetate  1,334 mg Oral TID WC   Chlorhexidine Gluconate Cloth  6 each Topical Q0600   heparin  5,000 Units Subcutaneous Q8H   hydrocerin   Topical BID   insulin aspart  0-6 Units Subcutaneous TID WC   insulin glargine-yfgn  5 Units Subcutaneous QHS   ondansetron (ZOFRAN) IV  4 mg Intravenous Once    Dialysis Orders: TTS NW   4h  400/500  64kg  2/2.5  TDC  Hep 4000    Assessment/Plan:  Lethargy - improved.  Close to baseline. But still seems some confused-  dont know baseline   ESRD -  on HD TTS.  K 4.0 today. HD Monday. Due to Thanksgiving holiday patient will run  on alternate schedule this week.  Inpatient and outpatient holiday schedules differ due to unforeseen circumstances and are as follows: Normal HD schedule: Tuesday Thursday Saturday Inpatient HD schedule at [facility]: Monday, Wednesday, Saturday- so will plan for HD on Wed ( tomorrow)  Outpatient HD schedule: Tuesday, Friday, Sunday Normal HD Schedule: Monday Wednesday Friday  Hypertension/volume  - Blood pressure well controlled.  On midodrine pre HD.  Volume status improved.  UF as tolerated.     Anemia of CKD - Hgb over 11. No indication for ESA.  Secondary Hyperparathyroidism -  Ca and phos in goal.  Not on VDRA.  Continue binders (phoslo 2AC TID) when eating.  Nutrition - Renal diet w/fluid restrictions when eating DMT2 - per PMD Chronic neck fracture - per pmd Dispo - pending SNF placement  [place] 11/20/2021,8:43 AM  LOS: 0 days

## 2021-11-20 NOTE — Progress Notes (Signed)
Physical Therapy Treatment Patient Details Name: Jose Robinson MRN: [id number] DOB: 24-May-1956 Today's Date: 11/20/2021   History of Present Illness 65 yo male with onset of fracture on C1 ring at HD was seen in ED and returned to ALF.  He was returned to hosp on 11/17, currently fairly somnolent, but easily arousable.  New dx of anion gap metabolic acidosis, acute metabolic encephalopathy, anemia, pulm edema.  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  Skin is barely closed and will require medical approval to wear prosthesis.  PMHx:  PVD, atherosclerosis, ESRD, DM, on HD,    PT Comments    Pt with improved cognition, awareness of safety, oriented x 4, and no confusion as pt had initially. Pt with noted wound on R ant tib however pt states "that's been there, I"ve been walking on it" Pt amb 35" with RW today with minA. R prosthesis then removed. Pt instructed to only don R prosthesis when pt is ambulating and to remove when in bed and chair.Pt agreeable. Pt much improved both functionally and cognitively compared to previous sessions. Acute PT to cont to follow.    Recommendations for follow up therapy are one component of a multi-disciplinary discharge planning process, led by the attending physician.  Recommendations may be updated based on patient status, additional functional criteria and insurance authorization.  Follow Up Recommendations  Skilled nursing-short term rehab (<3 hours/day) (return to facility)     Assistance Recommended at Discharge Frequent or constant Supervision/Assistance  Equipment Recommendations  None recommended by PT    Recommendations for Other Services       Precautions / Restrictions Precautions Precautions: Fall;Cervical Precaution Booklet Issued: No Precaution Comments: verbally reviewed, adjusted collar for optimal fit Required Braces or Orthoses: Cervical Brace Cervical Brace: Hard collar;At  all times Other Brace: R LE prosthesis Restrictions Weight Bearing Restrictions: No     Mobility  Bed Mobility               General bed mobility comments: pt sitting EOB upon PT arrival    Transfers Overall transfer level: Needs assistance Equipment used: Rolling walker (2 wheels) Transfers: Sit to/from Stand Sit to Stand: Min assist;Mod assist;+2 physical assistance;From elevated surface           General transfer comment: min/modA to power up from EOB, minA for chair, minA to steady during transition of hands from bed to RW    Ambulation/Gait Ambulation/Gait assistance: Min assist (2nd person for chair follow) Gait Distance (Feet): 35 Feet Assistive device: Rolling walker (2 wheels) Gait Pattern/deviations: Step-through pattern;Decreased stride length;Narrow base of support;Trunk flexed Gait velocity: slow Gait velocity interpretation: <1.31 ft/sec, indicative of household ambulator   General Gait Details: pt with good walker management, short step length, v/c's to stand up straight, pt c/o my back hurts   Stairs             Wheelchair Mobility    Modified Rankin (Stroke Patients Only)       Balance Overall balance assessment: Needs assistance Sitting-balance support: Feet supported;No upper extremity supported Sitting balance-Leahy Scale: Good     Standing balance support: Bilateral upper extremity supported Standing balance-Leahy Scale: Poor Standing balance comment: dependent on RW                            Cognition Arousal/Alertness: Awake/alert Behavior During Therapy: WFL for tasks assessed/performed Overall  Cognitive Status: Within Functional Limits for tasks assessed                                 General Comments: pt much improved from congitive standpoing compared to previous PT session. Pt oriented, not impulsive, compliant and cooperative. Pt able to instruct PT on how to don prosthesis         Exercises      General Comments General comments (skin integrity, edema, etc.): pt with noted scabs on R elbow, L knee, and anterior tibia of R LE, pt stated the R wound has been there forever and he walks just fine with the prosthesis. Pt requiring maxA to don shrinker and prosetheis due to limited finger dexterity and strength. Pt's prosethesis and shrinker removed once in chair and instructed pt to only where it for walking and not to keep it on in the bed or in chair due to wound on R anterior tib      Pertinent Vitals/Pain Pain Assessment: Faces Faces Pain Scale: Hurts a little bit Pain Location: neck Pain Descriptors / Indicators: Guarding Pain Intervention(s): Monitored during session    Home Living                          Prior Function            PT Goals (current goals can now be found in the care plan section) Acute Rehab PT Goals Patient Stated Goal: "get [place] here" Progress towards PT goals: Progressing toward goals    Frequency    Min 3X/week      PT Plan Current plan remains appropriate    Co-evaluation              AM-PAC PT "6 Clicks" Mobility   Outcome Measure  Help needed turning from your back to your side while in a flat bed without using bedrails?: A Little Help needed moving from lying on your back to sitting on the side of a flat bed without using bedrails?: A Little Help needed moving to and from a bed to a chair (including a wheelchair)?: A Little Help needed standing up from a chair using your arms (e.g., wheelchair or bedside chair)?: A Little Help needed to walk in hospital room?: A Little Help needed climbing 3-5 steps with a railing? : Total 6 Click Score: 16    End of Session Equipment Utilized During Treatment: Gait belt Activity Tolerance: Patient tolerated treatment well Patient left: in chair;with call bell/phone within reach;with chair alarm set Nurse Communication: Mobility status PT Visit Diagnosis: Muscle  weakness (generalized) (M62.81);Pain;Other abnormalities of gait and mobility (R26.89);Difficulty in walking, not elsewhere classified (R26.2)     Time: 5790-3833 PT Time Calculation (min) (ACUTE ONLY): 31 min  Charges:  $Gait Training: 8-22 mins $Therapeutic Activity: 8-22 mins                     Kittie Plater, PT, DPT Acute Rehabilitation Services Pager #: [phone] Office #: [phone]    Berline Lopes 11/20/2021, 2:02 PM

## 2021-11-20 NOTE — TOC Progression Note (Signed)
Transition of Care [facility]) - Progression Note    Patient Details  Name: Jose Robinson MRN: [id number] Date of Birth: 15-Sep-1956  Transition of Care [facility]) CM/SW [place],  Phone Number: 11/20/2021, 10:39 AM  Clinical Narrative:   CSW following for discharge back to ALF. CSW attempted to contact [facility], and [facility] is unavailable at this time, will call CSW back. CSW to continue to try to reach [facility] to get patient back.     Expected Discharge Plan: Assisted Living Barriers to Discharge: Continued Medical Work up  Expected Discharge Plan and Services Expected Discharge Plan: Assisted Living In-house Referral: Clinical Social Work     Living arrangements for the past 2 months: [place]                                       Social Determinants of Health (SDOH) Interventions    Readmission Risk Interventions No flowsheet data found.

## 2021-11-20 NOTE — NC FL2 (Signed)
[place] MEDICAID FL2 LEVEL OF CARE SCREENING TOOL     IDENTIFICATION  Patient Name: Jose Robinson Birthdate: [birth year] Sex: male Admission Date (Current Location): 11/15/2021  [facility] and [place] Number:  Herbalist and Address:  [facility] [place], [place] [address] [place], [place] [address]      Provider Number: [id number]  Attending Physician Name and Address:  Aldine Contes, MD  Relative Name and Phone Number:  Cope Marte, [phone]    Current Level of Care: Hospital Recommended Level of Care: [place] Prior Approval Number:    Date Approved/Denied:   PASRR Number: [id number]  Discharge Plan: SNF    Current Diagnoses: Patient Active Problem List   Diagnosis Date Noted   Acute metabolic encephalopathy 81/85/6314   Diabetes mellitus type 2, uncontrolled, with complications 97/01/6377   Diabetic nephropathy ([place]) 58/85/0277   Chronic systolic CHF (congestive heart failure) ([place]) 04/16/2021   Hypoglycemia 04/15/2021   Hyperglycemia 04/15/2021   DKA (diabetic ketoacidosis) ([place]) 01/24/2021   COVID-19 virus infection 01/24/2021   Syncope 01/24/2021   ESRD (end stage renal disease) ([place]) 01/24/2021   Congestive heart failure (CHF) ([place]) 01/24/2021   Hypotension 01/24/2021   Shock ([place]) 01/24/2021    Orientation RESPIRATION BLADDER Height & Weight     Self, Place  Normal Continent Weight: 143 lb 4.8 oz (65 kg) Height:  5\' 11"  (180.3 cm)  BEHAVIORAL SYMPTOMS/MOOD NEUROLOGICAL BOWEL NUTRITION STATUS      Continent Diet (See DC summary)  AMBULATORY STATUS COMMUNICATION OF NEEDS Skin   Extensive Assist Verbally Other (Comment) (R BKA, Finger amputation)                       Personal Care Assistance Level of Assistance  Bathing, Feeding, Dressing Bathing Assistance: Limited assistance Feeding assistance: Independent Dressing Assistance: Limited assistance     Functional Limitations Info  Sight,  Hearing, Speech Sight Info: Adequate Hearing Info: Adequate Speech Info: Adequate    SPECIAL CARE FACTORS FREQUENCY  PT (By licensed PT), OT (By licensed OT)     PT Frequency: 5x week OT Frequency: 5x week            Contractures Contractures Info: Not present    Additional Factors Info  Code Status, Allergies Code Status Info: Full Allergies Info: Acetaminophen   Prednisone   Ivp Dye (Iodinated Diagnostic Agents)           Current Medications (11/20/2021):  This is the current hospital active medication list Current Facility-Administered Medications  Medication Dose Route Frequency Provider Last Rate Last Admin   calcium acetate (PHOSLO) capsule 1,334 mg  1,334 mg Oral TID WC Timothy Lasso, MD   1,334 mg at 11/20/21 0909   Chlorhexidine Gluconate Cloth 2 % PADS 6 each  6 each Topical Q0600 Penninger, Lindsay, PA   6 each at 11/20/21 0400   heparin injection 5,000 Units  5,000 Units Subcutaneous Q8H Lacinda Axon, MD   5,000 Units at 11/20/21 0516   hydrocerin (EUCERIN) cream   Topical BID Orvis Brill, MD   Given at 11/20/21 1059   HYDROmorphone (DILAUDID) tablet 2 mg  2 mg Oral Q6H PRN Rehman, Areeg N, DO   2 mg at 11/20/21 0909   insulin aspart (novoLOG) injection 0-6 Units  0-6 Units Subcutaneous TID WC Timothy Lasso, MD   1 Units at 11/18/21 1633   insulin glargine-yfgn (SEMGLEE) injection 5 Units  5 Units Subcutaneous QHS [place],  Charisse March, MD   5 Units at 11/18/21 2100   lip balm (CARMEX) ointment   Topical PRN Aldine Contes, MD       midodrine (PROAMATINE) tablet 10 mg  10 mg Oral Q dialysis Penninger, [place], [place]   10 mg at 11/19/21 0833   ondansetron (ZOFRAN) injection 4 mg  4 mg Intravenous Once Wynona Dove A, DO       ondansetron [facility]) tablet 4 mg  4 mg Oral Q6H PRN Lacinda Axon, MD       Or   ondansetron (ZOFRAN) injection 4 mg  4 mg Intravenous Q6H PRN Lacinda Axon, MD       senna-docusate (Senokot-S) tablet 1 tablet   1 tablet Oral QHS PRN Lacinda Axon, MD         Discharge Medications: Please see discharge summary for a list of discharge medications.  Relevant Imaging Results:  Relevant Lab Results:   Additional Information HD TTS at [place] [phone]  Geralynn Ochs, LCSW

## 2021-11-20 NOTE — Progress Notes (Signed)
Pt w/ low Bps, Khan (IMS) informed. Pt states he takes 5 mg midodrine Q day but has not received that dose here.

## 2021-11-20 NOTE — TOC Progression Note (Signed)
Transition of Care [facility]) - Progression Note    Patient Details  Name: Jose Robinson MRN: [id number] Date of Birth: Oct 21, 1956  Transition of Care [facility]) CM/SW [place], [place] Phone Number: 11/20/2021, 3:28 PM  Clinical Narrative:   CSW spoke with [facility] at [facility], that they have concerns about the patient being appropriate to return to ALF. [facility] does not have the intensity of supervision or rehab that could be provided at SNF, and they are concerned about the patient's wound not healing. [facility] suggesting SNF. CSW has faxed out patient, but he has no bed offers at this time. CSW to follow.    Expected Discharge Plan: [place] Barriers to Discharge: Continued Medical Work up, Ship broker, SNF Pending bed offer  Expected Discharge Plan and Services Expected Discharge Plan: [place] In-house Referral: Clinical Social Work     Living arrangements for the past 2 months: [place]                                       Social Determinants of Health (SDOH) Interventions    Readmission Risk Interventions No flowsheet data found.

## 2021-11-20 NOTE — Progress Notes (Signed)
HD#0 SUBJECTIVE:  Overnight Events: No overnight events  Interim History: Evaluated Jose Robinson at bedside. Reports feeling better. He is much more interactive and oriented today. Able to comprehend questions and answer appropriately. Does note that his pain is very well controlled with the oral dilaudid.   Has been having a headache intermittently that is very painful for him. Discussed that imaging thus far has been reassuring.  Would like to work with PT today.  OBJECTIVE:  Vital Signs: Vitals:   11/20/21 0313 11/20/21 0500 11/20/21 0742 11/20/21 1130  BP: 102/70  95/63 (!) 83/62  Pulse: 68  82 98  Resp: 20  18 20   Temp: 98.4 F (36.9 C)  98.5 F (36.9 C) 98.2 F (36.8 C)  TempSrc: Oral  Oral Oral  SpO2: 97%  91% 93%  Weight:  65 kg    Height:       Supplemental O2: Nasal Cannula SpO2: 93 % O2 Flow Rate (L/min): 2 L/min  Filed Weights   11/19/21 0756 11/19/21 1230 11/20/21 0500  Weight: 68.2 kg 64.1 kg 65 kg     Intake/Output Summary (Last 24 hours) at 11/20/2021 1159 Last data filed at 11/20/2021 1010 Gross per 24 hour  Intake 720 ml  Output --  Net 720 ml   Net IO Since Admission: -3,740 mL [11/20/21 1159]  Physical Exam: Physical Exam Constitutional:      Comments: Chronically ill-appearing  HENT:     Head: Normocephalic and atraumatic.  Eyes:     Extraocular Movements: Extraocular movements intact.  Cardiovascular:     Rate and Rhythm: Normal rate.     Pulses: Normal pulses.     Heart sounds: Normal heart sounds.  Pulmonary:     Effort: Pulmonary effort is normal.     Breath sounds: Normal breath sounds.  Abdominal:     General: Bowel sounds are normal.     Palpations: Abdomen is soft.     Tenderness: There is no abdominal tenderness.  Musculoskeletal:        General: Normal range of motion.     Cervical back: Normal range of motion.     Right lower leg: No edema.     Left lower leg: No edema.     Comments: Right BKA  Skin:    General:  Skin is warm and dry.  Neurological:     Mental Status: He is alert and oriented to person, place, and time. Mental status is at baseline.  Psychiatric:        Mood and Affect: Mood normal.    Patient Lines/Drains/Airways Status     Active Line/Drains/Airways     Name Placement date Placement time Site Days   Hemodialysis Catheter Left Subclavian Double lumen Permanent (Tunneled) 01/25/21  1000  Subclavian  299            Pertinent Labs: CBC Latest Ref Rng & Units 11/19/2021 11/18/2021 11/17/2021  WBC 4.0 - 10.5 K/uL 4.1 4.8 5.5  Hemoglobin 13.0 - 17.0 g/dL 11.5(L) 11.6(L) 11.8(L)  Hematocrit 39.0 - 52.0 % 38.3(L) 39.2 39.5  Platelets 150 - 400 K/uL 252 260 268    CMP Latest Ref Rng & Units 11/19/2021 11/18/2021 11/17/2021  Glucose 70 - 99 mg/dL 117(H) 153(H) 124(H)  BUN 8 - 23 mg/dL 34(H) 19 34(H)  Creatinine 0.61 - 1.24 mg/dL 7.52(H) 5.47(H) 7.16(H)  Sodium 135 - 145 mmol/L 133(L) 132(L) 131(L)  Potassium 3.5 - 5.1 mmol/L 4.1 4.0 6.2(H)  Chloride 98 - 111 mmol/L  94(L) 95(L) 93(L)  CO2 22 - 32 mmol/L 27 26 25   Calcium 8.9 - 10.3 mg/dL 8.4(L) 8.5(L) 8.2(L)  Total Protein 6.5 - 8.1 g/dL - - -  Total Bilirubin 0.3 - 1.2 mg/dL - - -  Alkaline Phos 38 - 126 U/L - - -  AST 15 - 41 U/L - - -  ALT 0 - 44 U/L - - -    Recent Labs    11/19/21 2128 11/20/21 0602 11/20/21 1153  GLUCAP 104* 90 221*     Pertinent Imaging: No results found.  ASSESSMENT/PLAN:  Assessment: Principal Problem:   Acute metabolic encephalopathy   Plan: ESRD on HD TTS Anion gap metabolic acidosis, resolved Anemia of chronic disease, stable Acute metabolic encephalopathy - resolved -Continue hemodialysis MWS while inpatient and TSF in the OP setting. - Cont midodrine   T2DM, insulin-dependent with complications -Continue SSI and long-acting insulin titrate to goal CBG   Recent C1 fracture -Continue c-collar -Continue pain management with Dilaudid - PT/OT pending  Best  Practice: Diet: Regular diet IVF: Fluids: none, Rate: None VTE: heparin injection 5,000 Units Start: 11/16/21 0600 Code: Full AB: none Therapy Recs: Pending, DME: none DISPO: Anticipated discharge tomorrow to  TBD  pending  PT recs .  Signature: Lawerance Cruel, D.O.  Internal Medicine Resident, PGY-3 [facility]  Pager: [phone] 11:59 AM, 11/20/2021   Please contact the on call pager after 5 pm and on weekends at [phone].

## 2021-11-21 ENCOUNTER — Encounter: Payer: Self-pay | Admitting: Occupational Therapy

## 2021-11-21 DIAGNOSIS — G9341 Metabolic encephalopathy: Secondary | ICD-10-CM | POA: Diagnosis not present

## 2021-11-21 DIAGNOSIS — S12000A Unspecified displaced fracture of first cervical vertebra, initial encounter for closed fracture: Secondary | ICD-10-CM | POA: Diagnosis not present

## 2021-11-21 LAB — CBC
HCT: 39.7 % (ref 39.0–52.0)
Hemoglobin: 11.8 g/dL — ABNORMAL LOW (ref 13.0–17.0)
MCH: 26.3 pg (ref 26.0–34.0)
MCHC: 29.7 g/dL — ABNORMAL LOW (ref 30.0–36.0)
MCV: 88.4 fL (ref 80.0–100.0)
Platelets: 279 10*3/uL (ref 150–400)
RBC: 4.49 MIL/uL (ref 4.22–5.81)
RDW: 15.3 % (ref 11.5–15.5)
WBC: 4.5 10*3/uL (ref 4.0–10.5)
nRBC: 0 % (ref 0.0–0.2)

## 2021-11-21 LAB — GLUCOSE, CAPILLARY
Glucose-Capillary: 165 mg/dL — ABNORMAL HIGH (ref 70–99)
Glucose-Capillary: 115 mg/dL — ABNORMAL HIGH (ref 70–99)
Glucose-Capillary: 242 mg/dL — ABNORMAL HIGH (ref 70–99)

## 2021-11-21 LAB — RENAL FUNCTION PANEL
Albumin: 2.7 g/dL — ABNORMAL LOW (ref 3.5–5.0)
Anion gap: 12 (ref 5–15)
BUN: 42 mg/dL — ABNORMAL HIGH (ref 8–23)
CO2: 26 mmol/L (ref 22–32)
Calcium: 8.5 mg/dL — ABNORMAL LOW (ref 8.9–10.3)
Chloride: 92 mmol/L — ABNORMAL LOW (ref 98–111)
Creatinine, Ser: 7.37 mg/dL — ABNORMAL HIGH (ref 0.61–1.24)
GFR, Estimated: 8 mL/min — ABNORMAL LOW (ref >60)
Glucose, Bld: 168 mg/dL — ABNORMAL HIGH (ref 70–99)
Phosphorus: 3.1 mg/dL (ref 2.5–4.6)
Potassium: 4.1 mmol/L (ref 3.5–5.1)
Sodium: 130 mmol/L — ABNORMAL LOW (ref 135–145)

## 2021-11-21 MED ORDER — ZINC OXIDE 12.8 % EX OINT
TOPICAL_OINTMENT | Freq: Two times a day (BID) | CUTANEOUS | Status: DC | PRN
  Filled 2021-11-21: qty 56.7

## 2021-11-21 MED ORDER — BARRIER CREAM NON-SPECIFIED: 1.0000 "application " | TOPICAL_CREAM | Freq: Two times a day (BID) | TOPICAL | Status: DC | PRN

## 2021-11-21 MED ORDER — MIDODRINE HCL 5 MG PO TABS
10.0000 mg | ORAL_TABLET | Freq: Two times a day (BID) | ORAL | Status: DC
  Administered 2021-11-21: 10 mg via ORAL
  Filled 2021-11-21: qty 2

## 2021-11-21 MED ORDER — HEPARIN SODIUM (PORCINE) 1000 UNIT/ML DIALYSIS: 20.0000 [IU]/kg | INTRAMUSCULAR | Status: DC | PRN

## 2021-11-21 MED ORDER — HYDROMORPHONE HCL 2 MG PO TABS: 2.0000 mg | ORAL_TABLET | Freq: Four times a day (QID) | ORAL | 0 refills | Status: AC | PRN

## 2021-11-21 NOTE — Progress Notes (Addendum)
Following pt's case due to pt being an active HD pt. Noted snf search in progress. Navigator can assist if pt's HD arrangements need to be changed in order to secure snf placement. Pt is currently active at [facility] on TTS schedule. Pt arrives at 6:15 for 6:35 chair time. Will assist as needed.  Melven Sartorius Renal Navigator [phone]   Addendum at 4:39 pm: Case discussed with CSW. Pt my d/c to AL this afternoon vs home with dtr tomorrow. Either way, pt will need to receive out-pt HD at out-pt clinic on Friday and Sunday due to holiday schedule. Case discussed with renal PA and spoke to charge RN at clinic as well. CSW aware of the above info and to provide to AL and/or family.

## 2021-11-21 NOTE — TOC Progression Note (Signed)
Transition of Care [facility]) - Progression Note    Patient Details  Name: Jose Robinson MRN: [id number] Date of Birth: [birth year]  Transition of Care [facility]) CM/SW [place], [place] Phone Number: 11/21/2021, 3:53 PM  Clinical Narrative:   CSW spoke with PT and [facility] Supervisor, patient is now back to baseline, cleared to return to ALF with home health. CSW attempted to reach [facility] throughout the day today, unable to get confirmation that they can take the patient back. CSW called throughout the morning and there was no answer. CSW spoke with Wells Guiles at [facility] this afternoon, and she said [name] would have to provide that clearance. CSW contacted [name], left a voicemail, awaiting response. CSW has continued to call [name] throughout the afternoon with no answer and no call back. CSW spoke with [facility] Supervisor, who has also attempted to call [name] with no response. TOC Supervisor made APS report to Thayer Jew, as well as sent a well check through [employer] to [facility] asking for a call back.   CSW met with patient to provide update, he is understandably frustrated because he feels ready to leave the hospital and wants to go home. CSW discussed with patient's daughter, asked about any possibility of him going home with her for the holiday. Daughter has no support today but family will be in town tomorrow, she can come and pick the patient up tomorrow if there's no response from [facility]. CSW also spoke with renal navigator; if patient is discharged, he will have to dialyze Friday and Sunday, due to holiday schedule.  If patient discharges, recommendations are for home health. Home health setup through [place]. CSW updated patient, patient agreeable. CSW to follow.    Expected Discharge Plan: [place] Services Barriers to Discharge: Other (must enter comment) (unable to contact ALF)  Expected Discharge Plan and Services Expected Discharge Plan: [place] In-house Referral: Clinical Social Work     Living arrangements for the past 2 months: [place] Expected Discharge Date: 11/21/21                                     Social Determinants of Health (SDOH) Interventions    Readmission Risk Interventions No flowsheet data found.

## 2021-11-21 NOTE — Procedures (Signed)
Patient was seen on dialysis and the procedure was supervised.  BFR 200  Via TDC BP is  104/92.   Patient appears to be tolerating treatment well  Louis Meckel 11/21/2021

## 2021-11-21 NOTE — Progress Notes (Signed)
Inpatient Diabetes Program Recommendations  AACE/ADA: New Consensus Statement on Inpatient Glycemic Control (2015)  Target Ranges:  Prepandial:   less than 140 mg/dL      Peak postprandial:   less than 180 mg/dL (1-2 hours)      Critically ill patients:  140 - 180 mg/dL   Lab Results  Component Value Date   GLUCAP 165 (H) 11/21/2021   HGBA1C 7.6 (H) 11/16/2021    Diabetes history: Type 2 Dm Outpatient Diabetes medications: Trulicity 1.5 mg qwk, Novolog 3 units TID, Basaglar 10 units QHS Current orders for Inpatient glycemic control: Semglee 5 units QHS, Novolog 0-6 units TID  Inpatient Diabetes Program Recommendations:    Consider adding Novolog 3 units TID (assuming patient is consuming >50% of meals).  Thanks, Bronson Curb, MSN, RNC-OB Diabetes Coordinator [phone] (8a-5p)

## 2021-11-21 NOTE — Progress Notes (Signed)
[facility] Progress Note   Subjective:    Working on placement.    He has no new complaints today-  seen on HD -  concern over meds-  thinks at ALF they were messing up his meds-  wants to be educated regarding his meds-  also need to simplify his midodrine reg   Objective Vitals:   11/21/21 0448 11/21/21 0808 11/21/21 0902 11/21/21 0909  BP:  102/74 115/78 102/62  Pulse:  86 (!) 55 73  Resp:  20  13  Temp:  98.3 F (36.8 C) (!) 97.3 F (36.3 C)   TempSrc:  Oral Oral   SpO2:  97%    Weight: 68.1 kg  66.7 kg   Height:       Physical Exam General:chronically ill appearing male in NAD Heart:RRR, no mrg Lungs:CTAB, nml WOB Abdomen:soft, NTND Extremities:trace LLE edema, R BKA Dialysis Access: [facility]   Filed Weights   11/20/21 0500 11/21/21 0448 11/21/21 0902  Weight: 65 kg 68.1 kg 66.7 kg    Intake/Output Summary (Last 24 hours) at 11/21/2021 0923 Last data filed at 11/20/2021 1010 Gross per 24 hour  Intake 480 ml  Output --  Net 480 ml     Additional Objective Labs: Basic Metabolic Panel: Recent Labs  Lab 11/17/21 0805 11/18/21 0633 11/19/21 0820  NA 131* 132* 133*  K 6.2* 4.0 4.1  CL 93* 95* 94*  CO2 25 26 27   GLUCOSE 124* 153* 117*  BUN 34* 19 34*  CREATININE 7.16* 5.47* 7.52*  CALCIUM 8.2* 8.5* 8.4*  PHOS 4.5 3.2 3.7   Liver Function Tests: Recent Labs  Lab 11/15/21 1210 11/17/21 0805 11/18/21 0633 11/19/21 0820  AST 21  --   --   --   ALT 12  --   --   --   ALKPHOS 81  --   --   --   BILITOT 1.7*  --   --   --   PROT 6.8  --   --   --   ALBUMIN 2.7* 2.4* 2.7* 2.6*   CBC: Recent Labs  Lab 11/15/21 1210 11/15/21 1219 11/16/21 0337 11/17/21 0805 11/18/21 0633 11/19/21 0820 11/21/21 0923  WBC 8.5  --  10.0 5.5 4.8 4.1 4.5  NEUTROABS 7.3  --   --   --   --   --   --   HGB 12.0*   < > 12.4* 11.8* 11.6* 11.5* 11.8*  HCT 40.9   < > 41.5 39.5 39.2 38.3* 39.7  MCV 90.7  --  88.5 89.4 88.9 89.3 88.4  PLT 239  --  280 268 260  252 279   < > = values in this interval not displayed.   CBG: Recent Labs  Lab 11/20/21 0602 11/20/21 1153 11/20/21 1619 11/20/21 2144 11/21/21 0648  GLUCAP 90 221* 257* 252* 165*    Medications:   calcium acetate  1,334 mg Oral TID WC   Chlorhexidine Gluconate Cloth  6 each Topical Q0600   Chlorhexidine Gluconate Cloth  6 each Topical Q0600   heparin  5,000 Units Subcutaneous Q8H   hydrocerin   Topical BID   insulin aspart  0-6 Units Subcutaneous TID WC   insulin glargine-yfgn  5 Units Subcutaneous QHS   ondansetron (ZOFRAN) IV  4 mg Intravenous Once    Dialysis Orders: TTS NW   4h  400/500  64kg  2/2.5  TDC  Hep 4000    Assessment/Plan:  Lethargy - improved.  asking  questions about what happened  ESRD -  on HD TTS.  K 4.0 today. HD Monday. Due to Thanksgiving holiday patient will run on alternate schedule this week.  Inpatient and outpatient holiday schedules differ due to unforeseen circumstances and are as follows: Normal HD schedule: Tuesday Thursday Saturday Inpatient HD schedule at [facility]: Monday, Wednesday, Saturday- the one he is on so next will be on Saturday Outpatient HD schedule: Tuesday, Friday, Sunday Normal HD Schedule: Monday Wednesday Friday  Hypertension/volume  - Blood pressure well controlled.  On midodrine pre HD-  has been hypotensive other times-  will go ahead and increase midodrine to BID.  Volume status improved.  UF as tolerated.     Anemia of CKD - Hgb over 11. No indication for ESA.  Secondary Hyperparathyroidism -  Ca and phos in goal.  Not on VDRA.  Continue binders (phoslo 2AC TID) when eating.  Nutrition - Renal diet w/fluid restrictions when eating DMT2 - per PMD Chronic neck fracture - per pmd Dispo - pending SNF placement-  just checking labs with HD   [facility] 11/21/2021,9:23 AM  LOS: 0 days

## 2021-11-21 NOTE — Discharge Instructions (Signed)
Mr. Bearden, It was a pleasure taking care of you at [place] were admitted for altered mental status/nausea and treated for acute metabolic encephalopathy. We are discharging you home now that you are doing better. Please follow the following instructions.  1) Follow-up with internal medicine clinic in 1 week for hospital follow-up 2) Follow-up with neurosurgery in 1 month 3) Stop taking your oxycodone, and take Dilaudid 2 mg every 6 hours as needed for pain  Take care,  Dr. Linwood Dibbles, MD, MPH

## 2021-11-21 NOTE — Therapy (Signed)
[facility] [address] [place], [place], [address] Phone: [phone]   Fax:  [phone]  November 21, 2021    No Recipients  Occupational Therapy Discharge Summary   Patient: Jose Robinson MRN: [id number] Date of Birth: Jul 02, 1956  Diagnosis: No diagnosis found.  Referring Provider (OT): [place], A   The above patient had been seen in Occupational Therapy 19 times of 30 treatments scheduled.   The treatment consisted of BUE coordination and grip strength, ADL retraining and BUE range of motion.  The patient is: Improved however has not been seen since 10/15/21 and has had recent hospitalizations since last visit.  Subjective: Pt is discharged d/t not being back since last visit. Pt is now hospitalized and acute therapist recommended short term SNF placement.  Discharge Findings: Unable to check goals d/t not returning since last visit.      Sincerely,  Zachery Conch, OT/L  CC No Recipients  [facility] [address] [place], [place], [address] Phone: [phone]   Fax:  [phone]  Patient: [name] MRN: [id number] Date of Birth: October 17, 1956

## 2021-11-21 NOTE — Progress Notes (Signed)
   Overnight: Blood pressure has been low overnight SBP 90s; DBP 50s.  Overnight patient received 5mg  of midodrine.   Subjective: I seen and evaluated Mr. Kwong at bedside.  He states that he is feeling okay.  He states that he is not experiencing headache at the moment.  Otherwise, denies any other complaints.  Objective:  Vital signs in last 24 hours: Vitals:   11/20/21 2254 11/20/21 2334 11/21/21 0339 11/21/21 0448  BP: (!) 88/53 (!) 86/52 (!) 99/58   Pulse: 81 76 73   Resp:  18 18   Temp:  98.6 F (37 C) 98.2 F (36.8 C)   TempSrc:  Oral Oral   SpO2:  97% 97%   Weight:    68.1 kg  Height:       Physical Exam Constitutional:      General: He is not in acute distress. HENT:     Head: Normocephalic and atraumatic.  Eyes:     General: Scleral icterus present.  Cardiovascular:     Rate and Rhythm: Normal rate.     Heart sounds: Normal heart sounds.  Pulmonary:     Effort: Pulmonary effort is normal.     Breath sounds: Normal breath sounds.  Musculoskeletal:     Comments: Left knee skin breakdown covered in bandage     Right Lower Extremity: Right leg is amputated below knee.  Skin:    General: Skin is warm and dry.  Neurological:     General: No focal deficit present.     Mental Status: He is alert. Mental status is at baseline.  Psychiatric:        Behavior: Behavior is cooperative.        Cognition and Memory: Cognition normal.    Assessment/Plan:  Principal Problem:   Acute metabolic encephalopathy  Acute metabolic encephalopathy, resolved For the past 2 days patient complained of of left temporal headache.  MRI was ordered yesterday and the results reveal a exam arising from the supraclinoid left; large left and small right mastoid effusions.  Neurosurgery was consulted and recommended patient to follow-up in 1 month; No acute concerns at this time. Patient back at baseline. [facility] has concerns about accepting patient back due to them not having adequate  supervision/rehab that a SNF can provide.  [facility] suggests SNF.  CSW following up and no bed offers at this time.   --F/U with neurosurgery outpatient (in one month) --PT recommends SNF --Continue to avoid oxycodone   ESRD on HD TTS Anion gap metabolic acidosis, resolved Anemia of chronic disease, stable --Nephro following, appreciate --Continue HD MWS while inpatient, TFS in the outpatient --Trend kidney function, replete electrolytes --Continue midodrine 10 mg during HD --Strict I&O's, daily weights.   T2DM, insulin-dependent with complications I2M of 3.5% during this admission. On insulin glargine 10 units at bedtime at home.  --Start Semglee 5 units at bedtime --Continue SSI with meal   Recent C1 fracture --Continue c-collar --Continue Dilaudid 2 mg q6hr prn for moderate, severe pain    Prior to Admission Living Arrangement: Anticipated Discharge Location: Barriers to Discharge: Dispo: Anticipated discharge in approximately 1-2 day(s).   Timothy Lasso, MD 11/21/2021, 6:37 AM Pager: [phone] After 5pm on weekdays and 1pm on weekends: On Call pager [phone]

## 2021-11-21 NOTE — Discharge Summary (Addendum)
Name: Jose Robinson MRN: [id number] DOB: [birth year] 65 y.o. PCP: Pcp, No  Date of Admission: 11/15/2021  8:27 AM Date of Discharge:   11/21/2021 Attending Physician: Dr. Dareen Piano  Discharge Diagnosis: Principal Problem:   Acute metabolic encephalopathy End-stage renal disease Anion gap metabolic acidosis C1 fracture Type 2 diabetes mellitus Systolic heart failure Small brain aneurysm   Discharge Medications: Allergies as of 11/21/2021       Reactions   Acetaminophen Palpitations   Prednisone Other (See Comments)   Severe hallucinations/paranoid delusions after steroid premedications for contrast allergy, required IV haldol and restraints.   Ivp Dye [iodinated Diagnostic Agents] Hives        Medication List     STOP taking these medications    doxycycline 100 MG capsule Commonly known as: VIBRAMYCIN   oxyCODONE 5 MG immediate release tablet Commonly known as: Roxicodone   Oxycodone HCl 10 MG Tabs       TAKE these medications    alum & mag hydroxide-simeth 536-644-03 MG/5ML suspension Commonly known as: MAALOX/MYLANTA Take 10 mLs by mouth 4 (four) times daily -  with meals and at bedtime. As needed for indigestion   aspirin 81 MG chewable tablet Chew 1 tablet (81 mg total) by mouth daily.   atorvastatin 20 MG tablet Commonly known as: LIPITOR Take 1 tablet (20 mg total) by mouth daily.   Basaglar KwikPen 100 UNIT/ML Inject 10 Units into the skin at bedtime.   blood glucose meter kit and supplies Kit Dispense based on patient and insurance preference. Use up to four times daily as directed. (FOR ICD-9 250.00, 250.01). What changed:  how much to take how to take this when to take this   brimonidine 0.2 % ophthalmic solution Commonly known as: ALPHAGAN Place 1 drop into both eyes in the morning and at bedtime.   calcitRIOL 0.5 MCG capsule Commonly known as: ROCALTROL Take 2 capsules (1 mcg total) by mouth Every Tuesday,Thursday,and Saturday  with dialysis.   calcium acetate 667 MG capsule Commonly known as: PHOSLO Take 2 capsules (1,334 mg total) by mouth 3 (three) times daily with meals.   Calcium Antacid Ultra Strength 400 MG chewable tablet Generic drug: calcium elemental as carbonate Chew 2,000 mg by mouth daily.   cetirizine 10 MG tablet Commonly known as: ZYRTEC Take 10 mg by mouth daily.   donepezil 10 MG tablet Commonly known as: ARICEPT Take 10 mg by mouth daily.   famotidine 20 MG tablet Commonly known as: Pepcid Take 0.5 tablets (10 mg total) by mouth daily.   gabapentin 100 MG capsule Commonly known as: NEURONTIN Take 200 mg by mouth 3 (three) times daily.   glucose 4 GM chewable tablet Chew 2 tablets (8 g total) by mouth 4 (four) times daily as needed for low blood sugar (for CBG<80).   guaiFENesin-dextromethorphan 100-10 MG/5ML syrup Commonly known as: ROBITUSSIN DM Take 10 mLs by mouth every 4 (four) hours as needed for cough (chest congestion).   Halls Cough Drops 7.6 MG Lozg Generic drug: Menthol Use as directed 1 lozenge in the mouth or throat daily as needed (cough).   HYDROmorphone 2 MG tablet Commonly known as: DILAUDID Take 1 tablet (2 mg total) by mouth every 6 (six) hours as needed for up to 5 days for severe pain.   latanoprost 0.005 % ophthalmic solution Commonly known as: XALATAN Place 1 drop into both eyes at bedtime.   lidocaine 5 % Commonly known as: LIDODERM Place 1 patch onto the skin  daily. Remove & Discard patch within 12 hours or as directed by MD   loperamide 2 MG tablet Commonly known as: IMODIUM A-D Take 2-4 mg by mouth See admin instructions. 4 mg after 1st loose stool, then 2 mg after each loose stool. Max 4 tabs in 24 hours   loratadine 10 MG tablet Commonly known as: CLARITIN Take 10 mg by mouth daily.   midodrine 10 MG tablet Commonly known as: PROAMATINE Take 10 mg by mouth daily as needed (during dialysis treatment if BP drops).   midodrine 5 MG  tablet Commonly known as: PROAMATINE Take 1 tablet (5 mg total) by mouth every Tuesday, Thursday, Saturday, and Sunday.   midodrine 10 MG tablet Commonly known as: PROAMATINE Take 10 mg by mouth See admin instructions. 19m every Tuesday, Thursday, Saturday, and Sunday   mupirocin cream 2 % Commonly known as: BACTROBAN Apply 1 application topically 2 (two) times daily.   naproxen sodium 220 MG tablet Commonly known as: ALEVE Take 440 mg by mouth 2 (two) times daily as needed (pain/headache).   NovoLOG FlexPen 100 UNIT/ML FlexPen Generic drug: insulin aspart Inject 3 Units into the skin 3 (three) times daily with meals as needed for high blood sugar (CBG>180). What changed:  how much to take when to take this additional instructions   nystatin ointment Commonly known as: MYCOSTATIN Apply 1 application topically daily.   ondansetron 4 MG disintegrating tablet Commonly known as: Zofran ODT Take 1 tablet (4 mg total) by mouth every 8 (eight) hours as needed for nausea or vomiting.   pantoprazole 40 MG tablet Commonly known as: PROTONIX Take 1 tablet (40 mg total) by mouth daily. What changed: when to take this   Refresh Tears 0.5 % Soln Generic drug: carboxymethylcellulose Place 1 drop into both eyes every hour as needed for dry eyes.   Trulicity 1.5 MNO/7.0JGSopn Generic drug: Dulaglutide Inject 1.5 mg into the skin every Thursday.   Zenpep 5000-24000 units Cpep Generic drug: Pancrelipase (Lip-Prot-Amyl) Take 1 capsule by mouth 3 (three) times daily with meals.               Discharge Care Instructions  (From admission, onward)           Start     Ordered   11/21/21 0000  Discharge wound care:       Comments: Apply dry dressing to the area and change daily.   11/21/21 1454            Disposition and follow-up:   [name]  At the hospital follow up visit please  address:  1.  Follow-up:  a.  Acute metabolic encephalopathy: Mental status gradually improved with his HD sessions.  Patient's oxycodone was discontinued on admission.  Continue to assess patient's mental status    b.  ESRD: Patient receives HD inpatient with improvement in symptoms. His outpatient HD schedule will be Tuesday, Friday and Saturday due to holiday.   c.  C1 fracture: Patient remained in c-collar and pain was managed with Dilaudid.   d.  Brain aneurysm: Very small (1 to 2 mm).  Patient will follow up with neurosurgery in 1 month.  2.  Labs / imaging needed at time of follow-up: BMP  3.  Pending labs/ test needing follow-up: None  [facility]  Course by problem list: Acute metabolic encephalopathy Patient presented with altered mental status after episodes of vomiting on his way to his dialysis session.  Labs show anion gap metabolic acidosis but BUN was not significantly elevated. No evidence of infection on labs, UA or chest x-ray. Imaging of the head/brain did not show any evidence of stroke. Patient's mental status gradually improved with each HD session and was back to baseline within a few days of his hospitalization. His AMS was thought to be due to buildup of his oxycodone his system to this medication was discontinued on on admission.  ESRD on HD TTS, anion gap metabolic acidosis Patient presented with altered mental status after he was sent from his dialysis center to the ED due to suppose flulike symptoms.  Patient was found to have anion gap metabolic acidosis with electrolyte derangements on admission.  Nephrology was consulted and patient was taken to HD on day of admission.  Continue to receive HD throughout his admission with improvement in mental status and resolution of electrolyte abnormalities and anion gap metabolic  acidosis.  Patient's inpatient HD schedule was Monday, Wednesday, and Friday.  Due to the Thanksgiving holiday, patient's outpatient schedule will be Tuesday, Friday and Saturday.  C1 fracture Patient reports he sustained a neck fracture during the recent dialysis session.  He was seen in the ED on 11/05 and CT cervical spine showed fractures of the anterior and posterior rings and left lateral mass of C1.  Patient was discharged home on oxycodone which was thought to be the likely cause of patient's altered mental status.  Oxycodone was discontinued on on admission and patient was started on Dilaudid for pain.  His c-collar remained in place throughout hospitalization.  D4JW with complications L2H of 5.7% 11/08/2021. 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 Patient home medications include Trulicity 1.5 mg and NovoLog 3 units with meals.  Patient was put on sliding scale insulin during hospitalization.  Chronic systolic heart failure Most recent echo 01/20/2021 revealed LF EF 45 to 50% with mildly decreased function of the left ventricle.  Patient was initially placed on 2 L on 2 [place] on admission with chest x-ray showing mild edema and a BNP of 649.  Patient's fluid status improved after first HD session patient did not require any O2 supplementation throughout the rest of his hospitalization.  Brain aneurysm 5 days into his hospitalization, patient complained of new onset of headache.  MRI brain was obtained which did not show any acute intracranial abnormalities.  MRA however showed a small (1 to 2 mm) inferiorly directed outpouching arising from the supraclinoid left ICA consistent with small aneurysm versus infundibulum with vessel not well seen by MRA. Neurosurgery was consulted and they recommended outpatient follow-up in 1 month. They believed patient's headache was likely due to large left and small right mastoid effusions  found on the MRI.   Subjective: Patient was evaluated at bedside while in HD.  He states that he is feeling okay. He states that he is not experiencing headache at the moment.  Neck pain is better with pain medication.  Otherwise, denies any other complaints.  Patient agreeable to discharge to assisted living facility.  Discharge Vitals:   BP (!) 88/56   Pulse 68   Temp (!) 97.3 F (36.3 C) (Oral)   Resp 14   Ht '5\' 11"'  (1.803 m)   Wt 66.7 kg   SpO2 97%  BMI 20.51 kg/m  General: Pleasant, well-appearing elderly man laying in HD bed. No acute distress. Neck: C-collar in place CV: RRR. No murmurs, rubs, or gallops. No LE edema Pulmonary: Lungs CTAB. Normal effort. No wheezing or rales. Abdominal: Soft, nontender, nondistended. Normal bowel sounds. Extremities: Right BKA. Small healing wound on left knee covered with bandage. Skin: Warm and dry. No obvious rash or lesions. Neuro: A&Ox3. Moves all extremities. Normal sensation. No focal deficit. Psych: Normal mood and affect   Pertinent Labs, Studies, and Procedures:  CBC Latest Ref Rng & Units 11/21/2021 11/19/2021 11/18/2021  WBC 4.0 - 10.5 K/uL 4.5 4.1 4.8  Hemoglobin 13.0 - 17.0 g/dL 11.8(L) 11.5(L) 11.6(L)  Hematocrit 39.0 - 52.0 % 39.7 38.3(L) 39.2  Platelets 150 - 400 K/uL 279 252 260    CMP Latest Ref Rng & Units 11/21/2021 11/19/2021 11/18/2021  Glucose 70 - 99 mg/dL 168(H) 117(H) 153(H)  BUN 8 - 23 mg/dL 42(H) 34(H) 19  Creatinine 0.61 - 1.24 mg/dL 7.37(H) 7.52(H) 5.47(H)  Sodium 135 - 145 mmol/L 130(L) 133(L) 132(L)  Potassium 3.5 - 5.1 mmol/L 4.1 4.1 4.0  Chloride 98 - 111 mmol/L 92(L) 94(L) 95(L)  CO2 22 - 32 mmol/L '26 27 26  ' Calcium 8.9 - 10.3 mg/dL 8.5(L) 8.4(L) 8.5(L)  Total Protein 6.5 - 8.1 g/dL - - -  Total Bilirubin 0.3 - 1.2 mg/dL - - -  Alkaline Phos 38 - 126 U/L - - -  AST 15 - 41 U/L - - -  ALT 0 - 44 U/L - - -    CT HEAD WO CONTRAST (5MM)  Result Date: 11/15/2021 CLINICAL DATA:  Delirium  EXAM: CT HEAD WITHOUT CONTRAST TECHNIQUE: Contiguous axial images were obtained from the base of the skull through the vertex without intravenous contrast. COMPARISON:  CT head April 15, 2021.  CT cervical spine 09/03/2021. FINDINGS: Brain: Patchy white matter hypoattenuation no evidence of acute infarction, hemorrhage, hydrocephalus, extra-axial collection or mass lesion/mass effect., nonspecific but compatible with chronic microvascular disease. Mild generalized atrophy. Vascular: No hyperdense vessel identified. Calcific intracranial atherosclerosis. Skull: Left posterior scalp and subcutaneous soft tissue thickening/stranding. No acute fracture. Sinuses/Orbits: Left maxillary sinus mucosal thickening with osteitis, suggestive of chronicity. Other: Partially imaged C1 ring fractures, better characterized on recent CT cervical spine. IMPRESSION: 1. No evidence of acute intracranial abnormality. 2. Mild chronic microvascular ischemic disease and atrophy. 3. Nonspecific left posterior scalp and subcutaneous soft tissue thickening/stranding. 4. Partially imaged C1 ring fractures, better characterized on recent CT cervical spine. 5. Chronic appearing left maxillary sinus mucosal thickening. Electronically Signed   By: Margaretha Sheffield M.D.   On: 11/15/2021 11:15   DG Chest Port 1 View  Result Date: 11/15/2021 CLINICAL DATA:  Flu like symptoms EXAM: PORTABLE CHEST 1 VIEW COMPARISON:  08/01/2021 FINDINGS: Left IJ approach hemodialysis catheter remains in place, terminating at the level of the right atrium. Stable cardiomediastinal contours. Atherosclerotic calcification of the aortic knob. Mild bilateral interstitial prominence without focal airspace consolidation. No pleural effusion or pneumothorax. Vascular stents again noted within the bilateral axillary regions. IMPRESSION: Mild bilateral interstitial prominence which may reflect mild edema. Electronically Signed   By: Davina Poke D.O.   On: 11/15/2021  10:04    MRI/MRA brain: 11/20/21 IMPRESSION: MRI:   1. No evidence of acute intracranial abnormality. 2. Mild-to-moderate chronic microvascular disease. 3. Anterolisthesis of C3 on C4 with at least moderate canal stenosis at this level. An MRI of the cervical spine could further characterize if clinically indicated.  4. Large left and small right mastoid effusions.   MRA:   1. No large vessel occlusion or proximal hemodynamically significant stenosis. 2. Small (1-2 mm) inferiorly directed outpouching arising from the supraclinoid left ICA, compatible with small aneurysm versus infundibulum with vessel not well seen by MRA.  Discharge Instructions: Mr. Gravley, It was a pleasure taking care of you at [place] were admitted for altered mental status/nausea and treated for acute metabolic encephalopathy. We are discharging you home now that you are doing better. Please follow the following instructions.  1) Follow-up with internal medicine clinic in 1 week for hospital follow-up 2) Follow-up with neurosurgery in 1 month 3) Stop taking your oxycodone, and take Dilaudid 2 mg every 6 hours as needed for pain  Take care,  Dr. Linwood Dibbles, MD, MPH  Signed: Lacinda Axon, MD 11/21/2021, 1:32 PM   Pager: [phone]

## 2021-11-21 NOTE — Care Management (Signed)
[facility] called for transport to [facility] , Medical Necessity form also completed.  Updated Nurse Nydia Bouton.

## 2021-11-21 NOTE — NC FL2 (Signed)
[place] MEDICAID FL2 LEVEL OF CARE SCREENING TOOL     IDENTIFICATION  Patient Name: Jose Robinson Birthdate: [birth year] Sex: male Admission Date (Current Location): 11/15/2021  [facility] and [place] Number:  Herbalist and Address:  [facility]. [facility], [place] [address], [place], [place] [address]      Provider Number: [id number]  Attending Physician Name and Address:  Aldine Contes, MD  Relative Name and Phone Number:  Garreth Burnsworth, [phone]    Current Level of Care: Hospital Recommended Level of Care: [place] Prior Approval Number:    Date Approved/Denied:   PASRR Number: [id number]  Discharge Plan: Other (Comment) (Assisted living facility)    Current Diagnoses: Patient Active Problem List   Diagnosis Date Noted   Acute metabolic encephalopathy 19/50/9326   Diabetes mellitus type 2, uncontrolled, with complications 71/24/5809   Diabetic nephropathy ([place]) 98/33/8250   Chronic systolic CHF (congestive heart failure) ([place]) 04/16/2021   Hypoglycemia 04/15/2021   Hyperglycemia 04/15/2021   DKA (diabetic ketoacidosis) ([place]) 01/24/2021   COVID-19 virus infection 01/24/2021   Syncope 01/24/2021   ESRD (end stage renal disease) ([facility]) 01/24/2021   Congestive heart failure (CHF) ([place]) 01/24/2021   Hypotension 01/24/2021   Shock ([place]) 01/24/2021    Orientation RESPIRATION BLADDER Height & Weight     Self, Place, Time, Situation  Normal Continent Weight: 147 lb 0.8 oz (66.7 kg) Height:  '5\' 11"'  (180.3 cm)  BEHAVIORAL SYMPTOMS/MOOD NEUROLOGICAL BOWEL NUTRITION STATUS      Continent Diet (See DC summary)  AMBULATORY STATUS COMMUNICATION OF NEEDS Skin   Limited Assist Verbally Other (Comment) (R BKA, Finger amputation)                       Personal Care Assistance Level of Assistance  Bathing, Feeding, Dressing Bathing Assistance: Limited assistance Feeding assistance: Independent Dressing Assistance:  Limited assistance     Functional Limitations Info  Sight, Hearing, Speech Sight Info: Adequate Hearing Info: Adequate Speech Info: Adequate    SPECIAL CARE FACTORS FREQUENCY  PT (By licensed PT), OT (By licensed OT)     PT Frequency: 3x/wk with home health OT Frequency: 3x/wk with home health            Contractures Contractures Info: Not present    Additional Factors Info  Code Status, Allergies Code Status Info: Full Allergies Info: Acetaminophen   Prednisone   Ivp Dye (Iodinated Diagnostic Agents)           Current Medications (11/21/2021):  This is the current hospital active medication list Current Facility-Administered Medications  Medication Dose Route Frequency Provider Last Rate Last Admin   calcium acetate (PHOSLO) capsule 1,334 mg  1,334 mg Oral TID WC Timothy Lasso, MD   1,334 mg at 11/21/21 1403   Chlorhexidine Gluconate Cloth 2 % PADS 6 each  6 each Topical Q0600 Penninger, Ria Comment, [place]   6 each at 11/21/21 0553   Chlorhexidine Gluconate Cloth 2 % PADS 6 each  6 each Topical Q0600 Corliss Parish, MD       heparin injection 5,000 Units  5,000 Units Subcutaneous Q8H Lacinda Axon, MD   5,000 Units at 11/21/21 1409   hydrocerin (EUCERIN) cream   Topical BID Orvis Brill, MD   Given at 11/21/21 1421   HYDROmorphone (DILAUDID) tablet 2 mg  2 mg Oral Q6H PRN Rehman, Areeg N, DO   2 mg at 11/21/21 1409   insulin aspart (novoLOG) injection  0-6 Units  0-6 Units Subcutaneous TID WC Timothy Lasso, MD   1 Units at 11/21/21 0703   insulin glargine-yfgn [facility]) injection 5 Units  5 Units Subcutaneous QHS Lacinda Axon, MD   5 Units at 11/20/21 2155   lip balm (CARMEX) ointment   Topical PRN Aldine Contes, MD       menthol-cetylpyridinium (CEPACOL) lozenge 3 mg  1 lozenge Oral PRN Atway, Rayann N, DO   3 mg at 11/20/21 1726   midodrine (PROAMATINE) tablet 10 mg  10 mg Oral BID WC Corliss Parish, MD       ondansetron [facility])  injection 4 mg  4 mg Intravenous Once Wynona Dove A, DO       ondansetron [facility]) tablet 4 mg  4 mg Oral Q6H PRN Lacinda Axon, MD       Or   ondansetron (ZOFRAN) injection 4 mg  4 mg Intravenous Q6H PRN Lacinda Axon, MD       senna-docusate (Senokot-S) tablet 1 tablet  1 tablet Oral QHS PRN Lacinda Axon, MD       Zinc Oxide (TRIPLE PASTE) 12.8 % ointment   Topical BID PRN Joselyn Glassman A, [place]         Discharge Medications: STOP taking these medications     doxycycline 100 MG capsule Commonly known as: VIBRAMYCIN    oxyCODONE 5 MG immediate release tablet Commonly known as: Roxicodone    Oxycodone HCl 10 MG Tabs           TAKE these medications     alum & mag hydroxide-simeth 200-200-20 MG/5ML suspension Commonly known as: MAALOX/MYLANTA Take 10 mLs by mouth 4 (four) times daily -  with meals and at bedtime. As needed for indigestion    aspirin 81 MG chewable tablet Chew 1 tablet (81 mg total) by mouth daily.    atorvastatin 20 MG tablet Commonly known as: LIPITOR Take 1 tablet (20 mg total) by mouth daily.    Basaglar KwikPen 100 UNIT/ML Inject 10 Units into the skin at bedtime.    blood glucose meter kit and supplies Kit Dispense based on patient and insurance preference. Use up to four times daily as directed. (FOR ICD-9 250.00, 250.01). What changed:  how much to take how to take this when to take this    brimonidine 0.2 % ophthalmic solution Commonly known as: ALPHAGAN Place 1 drop into both eyes in the morning and at bedtime.    calcitRIOL 0.5 MCG capsule Commonly known as: ROCALTROL Take 2 capsules (1 mcg total) by mouth Every Tuesday,Thursday,and Saturday with dialysis.    calcium acetate 667 MG capsule Commonly known as: PHOSLO Take 2 capsules (1,334 mg total) by mouth 3 (three) times daily with meals.    Calcium Antacid Ultra Strength 400 MG chewable tablet Generic drug: calcium elemental as carbonate Chew 2,000 mg by mouth  daily.    cetirizine 10 MG tablet Commonly known as: ZYRTEC Take 10 mg by mouth daily.    donepezil 10 MG tablet Commonly known as: ARICEPT Take 10 mg by mouth daily.    famotidine 20 MG tablet Commonly known as: Pepcid Take 0.5 tablets (10 mg total) by mouth daily.    gabapentin 100 MG capsule Commonly known as: NEURONTIN Take 200 mg by mouth 3 (three) times daily.    glucose 4 GM chewable tablet Chew 2 tablets (8 g total) by mouth 4 (four) times daily as needed for low blood sugar (for CBG<80).  guaiFENesin-dextromethorphan 100-10 MG/5ML syrup Commonly known as: ROBITUSSIN DM Take 10 mLs by mouth every 4 (four) hours as needed for cough (chest congestion).    [place] Cough Drops 7.6 MG Lozg Generic drug: Menthol Use as directed 1 lozenge in the mouth or throat daily as needed (cough).    HYDROmorphone 2 MG tablet Commonly known as: DILAUDID Take 1 tablet (2 mg total) by mouth every 6 (six) hours as needed for up to 5 days for severe pain.    latanoprost 0.005 % ophthalmic solution Commonly known as: XALATAN Place 1 drop into both eyes at bedtime.    lidocaine 5 % Commonly known as: LIDODERM Place 1 patch onto the skin daily. Remove & Discard patch within 12 hours or as directed by MD    loperamide 2 MG tablet Commonly known as: IMODIUM A-D Take 2-4 mg by mouth See admin instructions. 4 mg after 1st loose stool, then 2 mg after each loose stool. Max 4 tabs in 24 hours    loratadine 10 MG tablet Commonly known as: CLARITIN Take 10 mg by mouth daily.    midodrine 10 MG tablet Commonly known as: PROAMATINE Take 10 mg by mouth daily as needed (during dialysis treatment if BP drops).    midodrine 5 MG tablet Commonly known as: PROAMATINE Take 1 tablet (5 mg total) by mouth every Tuesday, Thursday, Saturday, and Sunday.    midodrine 10 MG tablet Commonly known as: PROAMATINE Take 10 mg by mouth See admin instructions. 74m every Tuesday, Thursday, Saturday, and  Sunday    mupirocin cream 2 % Commonly known as: BACTROBAN Apply 1 application topically 2 (two) times daily.    naproxen sodium 220 MG tablet Commonly known as: ALEVE Take 440 mg by mouth 2 (two) times daily as needed (pain/headache).    NovoLOG FlexPen 100 UNIT/ML FlexPen Generic drug: insulin aspart Inject 3 Units into the skin 3 (three) times daily with meals as needed for high blood sugar (CBG>180). What changed:  how much to take when to take this additional instructions    nystatin ointment Commonly known as: MYCOSTATIN Apply 1 application topically daily.    ondansetron 4 MG disintegrating tablet Commonly known as: Zofran ODT Take 1 tablet (4 mg total) by mouth every 8 (eight) hours as needed for nausea or vomiting.    pantoprazole 40 MG tablet Commonly known as: PROTONIX Take 1 tablet (40 mg total) by mouth daily. What changed: when to take this    Refresh Tears 0.5 % Soln Generic drug: carboxymethylcellulose Place 1 drop into both eyes every hour as needed for dry eyes.    Trulicity 1.5 MAJ/5.1IDSopn Generic drug: Dulaglutide Inject 1.5 mg into the skin every Thursday.    Zenpep 5000-24000 units Cpep Generic drug: Pancrelipase (Lip-Prot-Amyl) Take 1 capsule by mouth 3 (three) times daily with meals.    Relevant Imaging Results:  Relevant Lab Results:   Additional Information [id number] EGeralynn Ochs LCSW

## 2021-11-21 NOTE — Progress Notes (Signed)
Physical Therapy Progress Note Patient Details Name: Jose Robinson MRN: [id number] DOB: 07-14-1956 Today's Date: 11/21/2021  Discussed pt case with CSW who reports high likelihood of SNF denial by insurance. Pt and daughter reporting to staff that pt is at baseline of function, however based on prior outpatient PT notes, it appears pt has had a decline in function when compared to yesterday's acute PT note. Recommend return to ALF with multidisciplinary home health therapies to follow up, if ALF is able to accommodate increased level of assist until return to baseline of function. Will continue to follow acutely.   Jose Robinson, PT, DPT Acute Rehabilitation Services Pager: [phone] Office: [phone]    Thelma Comp 11/21/2021, 11:33 AM

## 2021-11-21 NOTE — Plan of Care (Signed)
  Problem: Education: Goal: Knowledge of General Education information will improve Description: Including pain rating scale, medication(s)/side effects and non-pharmacologic comfort measures Outcome: Progressing   Problem: Clinical Measurements: Goal: Ability to maintain clinical measurements within normal limits will improve Outcome: Progressing   Problem: Activity: Goal: Risk for activity intolerance will decrease Outcome: Progressing   Problem: Nutrition: Goal: Adequate nutrition will be maintained Outcome: Progressing   Problem: Elimination: Goal: Will not experience complications related to bowel motility Outcome: Progressing   

## 2021-11-21 NOTE — Progress Notes (Signed)
Physical Therapy Progress Note Patient Details Name: AZARI HASLER MRN: [id number] DOB: March 21, 1956 Today's Date: 11/21/2021    11/21/21 1142   PT - Assessment/Plan  PT Plan Discharge plan needs to be updated  PT Visit Diagnosis Muscle weakness (generalized) (M62.81);Pain;Other abnormalities of gait and mobility (R26.89);Difficulty in walking, not elsewhere classified (R26.2)  PT Frequency (ACUTE ONLY) Min 3X/week  Follow Up Recommendations Home health PT  Assistance recommended at discharge Frequent or constant Supervision/Assistance  PT equipment None recommended by PT      Rolinda Roan, PT, DPT Acute Rehabilitation Services Pager: [phone] Office: [phone]    Thelma Comp 11/21/2021, 11:43 AM

## 2021-11-21 NOTE — Progress Notes (Signed)
Report given to [facility] NT at [facility] reflecting patient's current status.

## 2021-12-12 ENCOUNTER — Encounter (HOSPITAL_BASED_OUTPATIENT_CLINIC_OR_DEPARTMENT_OTHER): Payer: Medicare (Managed Care) | Attending: Internal Medicine | Admitting: Internal Medicine

## 2021-12-12 ENCOUNTER — Other Ambulatory Visit: Payer: Self-pay

## 2021-12-12 DIAGNOSIS — L97819 Non-pressure chronic ulcer of other part of right lower leg with unspecified severity: Secondary | ICD-10-CM | POA: Insufficient documentation

## 2021-12-12 DIAGNOSIS — E11622 Type 2 diabetes mellitus with other skin ulcer: Secondary | ICD-10-CM | POA: Diagnosis not present

## 2021-12-12 DIAGNOSIS — L97829 Non-pressure chronic ulcer of other part of left lower leg with unspecified severity: Secondary | ICD-10-CM | POA: Diagnosis not present

## 2021-12-14 NOTE — Progress Notes (Signed)
SHOWN, DISSINGER ([id number]) Visit Report for 12/12/2021 Allergy List Details Patient Name: Date of Service: Jose Robinson 12/12/2021 7:30 A M Medical Record Number: [id number] Patient Account Number: [id number] Date of Birth/Sex: Treating RN: [birth year] (65 y.o. Erie Noe Primary Care Peola Joynt: PCP, NO Other Clinician: Referring Johntavius Shepard: Treating Anquinette Pierro/Extender: Cheree Ditto in Treatment: 0 Allergies Active Allergies Iodinated Contrast Media Allergy Notes Electronic Signature(s) Signed: 12/14/2021 11:40:20 AM By: Rhae Hammock RN Entered By: Rhae Hammock on 12/12/2021 08:00:33 -------------------------------------------------------------------------------- Arrival Information Details Patient Name: Date of Service: Jose Robinson, Jose Minion T Jenetta Downer. 12/12/2021 7:30 A M Medical Record Number: [id number] Patient Account Number: [id number] Date of Birth/Sex: Treating RN: 02/01/1956 (65 y.o. Erie Noe Primary Care Rahkeem Senft: PCP, NO Other Clinician: Referring Ranee Peasley: Treating Pricella Gaugh/Extender: Cheree Ditto in Treatment: 0 Visit Information Patient Arrived: Wheel Chair Arrival Time: 07:56 Accompanied By: self Transfer Assistance: None Patient Identification Verified: Yes Secondary Verification Process Completed: Yes Patient Requires Transmission-Based Precautions: No Patient Has Alerts: No Electronic Signature(s) Signed: 12/14/2021 11:40:20 AM By: Rhae Hammock RN Entered By: Rhae Hammock on 12/12/2021 07:57:13 -------------------------------------------------------------------------------- Clinic Level of Care Assessment Details Patient Name: Date of Service: Jose Robinson 12/12/2021 7:30 A M Medical Record Number: [id number] Patient Account Number: [id number] Date of Birth/Sex: Treating RN: [birth year] (65 y.o. [name], [name] Primary Care Eion Timbrook: PCP, NO Other Clinician: Referring Ezeriah Luty: Treating  Jesscia Imm/Extender: Cheree Ditto in Treatment: 0 Clinic Level of Care Assessment Items TOOL 4 Quantity Score X- 1 0 Use when only an EandM is performed on FOLLOW-UP visit ASSESSMENTS - Nursing Assessment / Reassessment X- 1 10 Reassessment of Co-morbidities (includes updates in patient status) X- 1 5 Reassessment of Adherence to Treatment Plan ASSESSMENTS - Wound and Skin A ssessment / Reassessment []  - 0 Simple Wound Assessment / Reassessment - one wound X- 3 5 Complex Wound Assessment / Reassessment - multiple wounds []  - 0 Dermatologic / Skin Assessment (not related to wound area) ASSESSMENTS - Focused Assessment []  - 0 Circumferential Edema Measurements - multi extremities []  - 0 Nutritional Assessment / Counseling / Intervention []  - 0 Lower Extremity Assessment (monofilament, tuning fork, pulses) []  - 0 Peripheral Arterial Disease Assessment (using hand held doppler) ASSESSMENTS - Ostomy and/or Continence Assessment and Care []  - 0 Incontinence Assessment and Management []  - 0 Ostomy Care Assessment and Management (repouching, etc.) PROCESS - Coordination of Care []  - 0 Simple Patient / Family Education for ongoing care X- 1 20 Complex (extensive) Patient / Family Education for ongoing care X- 1 10 Staff obtains Programmer, systems, Records, T Results / Process Orders est X- 1 10 Staff telephones HHA, Nursing Homes / Clarify orders / etc []  - 0 Routine Transfer to another Facility (non-emergent condition) []  - 0 Routine Hospital Admission (non-emergent condition) X- 1 15 New Admissions / Biomedical engineer / Ordering NPWT Apligraf, etc. , []  - 0 Emergency Hospital Admission (emergent condition) []  - 0 Simple Discharge Coordination X- 1 15 Complex (extensive) Discharge Coordination PROCESS - Special Needs []  - 0 Pediatric / Minor Patient Management []  - 0 Isolation Patient Management []  - 0 Hearing / Language / Visual special needs []  -  0 Assessment of Community assistance (transportation, D/C planning, etc.) []  - 0 Additional assistance / Altered mentation []  - 0 Support Surface(s) Assessment (bed, cushion, seat, etc.) INTERVENTIONS - Wound Cleansing / Measurement []  - 0 Simple Wound Cleansing - one wound X- 3 5 Complex Wound Cleansing - multiple wounds X- 1 5  Wound Imaging (photographs - any number of wounds) []  - 0 Wound Tracing (instead of photographs) []  - 0 Simple Wound Measurement - one wound X- 3 5 Complex Wound Measurement - multiple wounds INTERVENTIONS - Wound Dressings []  - 0 Small Wound Dressing one or multiple wounds X- 3 15 Medium Wound Dressing one or multiple wounds []  - 0 Large Wound Dressing one or multiple wounds X- 1 5 Application of Medications - topical []  - 0 Application of Medications - injection INTERVENTIONS - Miscellaneous []  - 0 External ear exam []  - 0 Specimen Collection (cultures, biopsies, blood, body fluids, etc.) []  - 0 Specimen(s) / Culture(s) sent or taken to Lab for analysis []  - 0 Patient Transfer (multiple staff / Civil Service fast streamer / Similar devices) []  - 0 Simple Staple / Suture removal (25 or less) []  - 0 Complex Staple / Suture removal (26 or more) []  - 0 Hypo / Hyperglycemic Management (close monitor of Blood Glucose) X- 1 15 Ankle / Brachial Index (ABI) - do not check if billed separately X- 1 5 Vital Signs Has the patient been seen at the hospital within the last three years: Yes Total Score: 205 Level Of Care: New/Established - Level 5 Electronic Signature(s) Signed: 12/14/2021 11:40:20 AM By: Rhae Hammock RN Entered By: Rhae Hammock on 12/12/2021 09:10:18 -------------------------------------------------------------------------------- Encounter Discharge Information Details Patient Name: Date of Service: Jose Robinson, Jose Minion T Jenetta Downer. 12/12/2021 7:30 A M Medical Record Number: [id number] Patient Account Number: [id number] Date of Birth/Sex: Treating  RN: 1956-10-12 (65 y.o. Erie Noe Primary Care Mariann Palo: PCP, NO Other Clinician: Referring Juwuan Sedita: Treating Sadaf Przybysz/Extender: Cheree Ditto in Treatment: 0 Encounter Discharge Information Items Post Procedure Vitals Discharge Condition: Stable Temperature (F): 98.7 Ambulatory Status: Wheelchair Pulse (bpm): 78 Discharge Destination: Home Respiratory Rate (breaths/min): 17 Transportation: Private Auto Blood Pressure (mmHg): 131/86 Accompanied By: self Schedule Follow-up Appointment: Yes Clinical Summary of Care: Patient Declined Electronic Signature(s) Signed: 12/14/2021 11:40:20 AM By: Rhae Hammock RN Entered By: Rhae Hammock on 12/12/2021 09:11:26 -------------------------------------------------------------------------------- Lower Extremity Assessment Details Patient Name: Date of Service: Jose Robinson. 12/12/2021 7:30 A M Medical Record Number: [id number] Patient Account Number: [id number] Date of Birth/Sex: Treating RN: 10-24-1956 (65 y.o. [name], [name] Primary Care Chanz Cahall: PCP, NO Other Clinician: Referring Perl Folmar: Treating Jamesa Tedrick/Extender: Cheree Ditto in Treatment: 0 Edema Assessment Assessed: Shirlyn Goltz: Yes] [Right: No] Edema: [Left: Ye] [Right: s] Calf Left: Right: Point of Measurement: 35 cm From Medial Instep 32.8 cm Ankle Left: Right: Point of Measurement: 11 cm From Medial Instep 22.7 cm Knee To Floor Left: Right: From Medial Instep 48 cm Vascular Assessment Pulses: Dorsalis Pedis Palpable: [Left:Yes] Doppler Audible: [Left:Yes] Posterior Tibial Palpable: [Left:Yes] Doppler Audible: [Left:Inaudible] Blood Pressure: Brachial: [Left:140] Ankle: [Left:Dorsalis Pedis: 144 1.03] Electronic Signature(s) Signed: 12/14/2021 11:40:20 AM By: Rhae Hammock RN Entered By: Rhae Hammock on 12/12/2021  09:22:20 -------------------------------------------------------------------------------- Multi Wound Chart Details Patient Name: Date of Service: Jose Robinson, Jose Minion T O. 12/12/2021 7:30 A M Medical Record Number: [id number] Patient Account Number: [id number] Date of Birth/Sex: Treating RN: Apr 25, 1956 (65 y.o. Janyth Contes Primary Care Amanada Philbrick: PCP, NO Other Clinician: Referring Dalbert Stillings: Treating Lorinda Copland/Extender: Cheree Ditto in Treatment: 0 Vital Signs Height(in): 71 Capillary Blood Glucose(mg/dl): 118 Weight(lbs): 144 Pulse(bpm): 90 Body Mass Index(BMI): 20 Blood Pressure(mmHg): 140/93 Temperature(F): 98.6 Respiratory Rate(breaths/min): 17 Photos: [1:No Photos Left Knee] [2:No Photos Right Knee] [3:No Photos Right, Distal Knee] Wound Location: [1:Gradually Appeared] [2:Gradually Appeared] [3:Gradually Appeared] Wounding Event: [1:Diabetic Wound/Ulcer of the Lower] [2:Diabetic  Wound/Ulcer of the Lower] [3:Diabetic Wound/Ulcer of the Lower] Primary Etiology: [1:Extremity Congestive Heart Failure,] [2:Extremity Congestive Heart Failure,] [3:Extremity Congestive Heart Failure,] Comorbid History: [1:Hypertension, Type II Diabetes, End Stage Renal Disease, Neuropathy 11/12/2021] [2:Hypertension, Type II Diabetes, End Stage Renal Disease, Neuropathy 11/12/2021] [3:Hypertension, Type II Diabetes, End Stage Renal Disease, Neuropathy  11/12/2021] Date Acquired: [1:0] [2:0] [3:0] Weeks of Treatment: [1:Open] [2:Open] [3:Open] Wound Status: [1:3.7x0.9x0.1] [2:0.7x0.7x0.2] [3:2x2x0.2] Measurements L x W x D (cm) [1:2.615] [2:0.385] [3:3.142] A (cm) : rea [1:0.262] [2:0.077] [3:0.628] Volume (cm) : [1:Grade 2] [2:Grade 2] [3:Grade 2] Classification: [1:Medium] [2:Medium] [3:Medium] Exudate A mount: [1:Serosanguineous] [2:Serosanguineous] [3:Serosanguineous] Exudate Type: [1:red, brown] [2:red, brown] [3:red, brown] Exudate Color: [1:Distinct, outline attached] [2:Distinct,  outline attached] [3:Distinct, outline attached] Wound Margin: [1:None Present (0%)] [2:None Present (0%)] [3:None Present (0%)] Granulation A mount: [1:Large (67-100%)] [2:Large (67-100%)] [3:Large (67-100%)] Necrotic A mount: [1:Eschar, Adherent Slough] [2:Eschar, Adherent Slough] [3:Eschar, Adherent Slough] Necrotic Tissue: [1:Fascia: No] [2:Fascia: No] [3:Fascia: No] Exposed Structures: [1:Fat Layer (Subcutaneous Tissue): No Tendon: No Muscle: No Joint: No Bone: No None] [2:Fat Layer (Subcutaneous Tissue): No Tendon: No Muscle: No Joint: No Bone: No None] [3:Fat Layer (Subcutaneous Tissue): No Tendon: No Muscle: No Joint: No Bone: No  None] Treatment Notes Electronic Signature(s) Signed: 12/12/2021 4:51:07 PM By: Linton Ham MD Signed: 12/12/2021 5:40:46 PM By: Levan Hurst RN, BSN Entered By: Linton Ham on 12/12/2021 09:05:32 -------------------------------------------------------------------------------- Multi-Disciplinary Care Plan Details Patient Name: Date of Service: Jose Robinson, Jose Minion T O. 12/12/2021 7:30 A M Medical Record Number: [id number] Patient Account Number: [id number] Date of Birth/Sex: Treating RN: [birth year] (65 y.o. Erie Noe Primary Care Sherica Paternostro: PCP, NO Other Clinician: Referring Shaunita Seney: Treating Mustaf Antonacci/Extender: Cheree Ditto in Treatment: 0 Active Inactive Orientation to the Wound Care Program Nursing Diagnoses: Knowledge deficit related to the wound healing center program Goals: Patient/caregiver will verbalize understanding of the [place] Date Initiated: 12/12/2021 Target Resolution Date: 01/02/2022 Goal Status: Active Interventions: Provide education on orientation to the wound center Notes: Wound/Skin Impairment Nursing Diagnoses: Impaired tissue integrity Knowledge deficit related to ulceration/compromised skin integrity Goals: Patient will have a decrease in wound volume by X% from date:  (specify in notes) Date Initiated: 12/12/2021 Target Resolution Date: 01/02/2022 Goal Status: Active Patient/caregiver will verbalize understanding of skin care regimen Date Initiated: 12/12/2021 Target Resolution Date: 01/02/2022 Goal Status: Active Ulcer/skin breakdown will have a volume reduction of 30% by week 4 Date Initiated: 12/12/2021 Target Resolution Date: 01/03/2022 Goal Status: Active Interventions: Assess patient/caregiver ability to obtain necessary supplies Assess patient/caregiver ability to perform ulcer/skin care regimen upon admission and as needed Assess ulceration(s) every visit Notes: Electronic Signature(s) Signed: 12/14/2021 11:40:20 AM By: Rhae Hammock RN Entered By: Rhae Hammock on 12/12/2021 08:47:13 -------------------------------------------------------------------------------- Pain Assessment Details Patient Name: Date of Service: Jose Robinson. 12/12/2021 7:30 [place] Record Number: [id number] Patient Account Number: [id number] Date of Birth/Sex: Treating RN: [birth year] (65 y.o. Erie Noe Primary Care Abdinasir Spadafore: PCP, NO Other Clinician: Referring Irem Stoneham: Treating Derwin Reddy/Extender: Cheree Ditto in Treatment: 0 Active Problems Location of Pain Severity and Description of Pain Patient Has Paino Yes Site Locations Pain Location: Pain in Ulcers With Dressing Change: Yes Duration of the Pain. Constant / Intermittento Constant Rate the pain. Current Pain Level: 9 Worst Pain Level: 10 Least Pain Level: 0 Tolerable Pain Level: 9 Character of Pain Describe the Pain: Aching Pain Management and Medication Current Pain Management: Medication: No Cold Application: No Rest: No Massage: No Activity:  No T.E.N.S.: No Heat Application: No Leg drop or elevation: No Is the Current Pain Management Adequate: Adequate How does your wound impact your activities of daily livingo Sleep: No Bathing: No Appetite:  No Relationship With Others: No Bladder Continence: No Emotions: No Bowel Continence: No Work: No Toileting: No Drive: No Dressing: No Hobbies: No Electronic Signature(s) Signed: 12/14/2021 11:40:20 AM By: Rhae Hammock RN Entered By: Rhae Hammock on 12/12/2021 07:57:49 -------------------------------------------------------------------------------- Patient/Caregiver Education Details Patient Name: Date of Service: Jose Robinson 12/14/2022andnbsp7:30 [place] Record Number: [id number] Patient Account Number: [id number] Date of Birth/Gender: Treating RN: 05/03/1956 (65 y.o. Erie Noe Primary Care Physician: PCP, NO Other Clinician: Referring Physician: Treating Physician/Extender: Cheree Ditto in Treatment: 0 Education Assessment Education Provided To: Patient Education Topics Provided Welcome T [facility]: o Methods: Explain/Verbal Responses: State content correctly Electronic Signature(s) Signed: 12/14/2021 11:40:20 AM By: Rhae Hammock RN Entered By: Rhae Hammock on 12/12/2021 08:47:33 -------------------------------------------------------------------------------- Wound Assessment Details Patient Name: Date of Service: Jose Robinson. 12/12/2021 7:30 A M Medical Record Number: [id number] Patient Account Number: [id number] Date of Birth/Sex: Treating RN: 1956-08-25 (65 y.o. [name], [name] Primary Care Aeriana Speece: PCP, NO Other Clinician: Referring Corbin Hott: Treating Arda Daggs/Extender: Cheree Ditto in Treatment: 0 Wound Status Wound Number: 1 Primary Diabetic Wound/Ulcer of the Lower Extremity Etiology: Wound Location: Left Knee Wound Open Wounding Event: Gradually Appeared Status: Date Acquired: 11/12/2021 Comorbid Congestive Heart Failure, Hypertension, Type II Diabetes, End Weeks Of Treatment: 0 History: Stage Renal Disease, Neuropathy Clustered Wound: No Photos Photo Uploaded  By: Donavan Burnet on 12/13/2021 09:10:59 Wound Measurements Length: (cm) 3.7 Width: (cm) 0.9 Depth: (cm) 0.1 Area: (cm) 2.615 Volume: (cm) 0.262 % Reduction in Area: % Reduction in Volume: Epithelialization: None Tunneling: No Undermining: No Wound Description Classification: Grade 2 Wound Margin: Distinct, outline attached Exudate Amount: Medium Exudate Type: Serosanguineous Exudate Color: red, brown Foul Odor After Cleansing: No Slough/Fibrino Yes Wound Bed Granulation Amount: None Present (0%) Exposed Structure Necrotic Amount: Large (67-100%) Fascia Exposed: No Necrotic Quality: Eschar, Adherent Slough Fat Layer (Subcutaneous Tissue) Exposed: No Tendon Exposed: No Muscle Exposed: No Joint Exposed: No Bone Exposed: No Treatment Notes Wound #1 (Knee) Wound Laterality: Left Cleanser Soap and Water Discharge Instruction: May shower and wash wound with dial antibacterial soap and water prior to dressing change. Wound Cleanser Discharge Instruction: Cleanse the wound with wound cleanser prior to applying a clean dressing using gauze sponges, not tissue or cotton balls. Peri-Wound Care Topical Primary Dressing Santyl Ointment Discharge Instruction: Apply nickel thick amount to wound bed as instructed Secondary Dressing Bordered Gauze, 4x4 in Discharge Instruction: Apply over primary dressing as directed. Secured With Compression Wrap Compression Stockings Environmental education officer) Signed: 12/14/2021 11:40:20 AM By: Rhae Hammock RN Entered By: Rhae Hammock on 12/12/2021 08:19:33 -------------------------------------------------------------------------------- Wound Assessment Details Patient Name: Date of Service: Jose Robinson, [name]. 12/12/2021 7:30 A M Medical Record Number: [id number] Patient Account Number: [id number] Date of Birth/Sex: Treating RN: 1956-12-23 (65 y.o. Erie Noe Primary Care Blakelee Allington: PCP, NO Other  Clinician: Referring Adib Wahba: Treating Yuliya Nova/Extender: Cheree Ditto in Treatment: 0 Wound Status Wound Number: 2 Primary Diabetic Wound/Ulcer of the Lower Extremity Etiology: Wound Location: Right Knee Wound Open Wounding Event: Gradually Appeared Status: Date Acquired: 11/12/2021 Comorbid Congestive Heart Failure, Hypertension, Type II Diabetes, End Weeks Of Treatment: 0 History: Stage Renal Disease, Neuropathy Clustered Wound: No Photos Photo Uploaded By: Donavan Burnet on 12/13/2021 09:10:35 Wound Measurements Length: (cm)  0.7 Width: (cm) 0.7 Depth: (cm) 0.2 Area: (cm) 0.385 Volume: (cm) 0.077 % Reduction in Area: % Reduction in Volume: Epithelialization: None Tunneling: No Undermining: No Wound Description Classification: Grade 2 Wound Margin: Distinct, outline attached Exudate Amount: Medium Exudate Type: Serosanguineous Exudate Color: red, brown Foul Odor After Cleansing: No Slough/Fibrino Yes Wound Bed Granulation Amount: None Present (0%) Exposed Structure Necrotic Amount: Large (67-100%) Fascia Exposed: No Necrotic Quality: Eschar, Adherent Slough Fat Layer (Subcutaneous Tissue) Exposed: No Tendon Exposed: No Muscle Exposed: No Joint Exposed: No Bone Exposed: No Treatment Notes Wound #2 (Knee) Wound Laterality: Right Cleanser Soap and Water Discharge Instruction: May shower and wash wound with dial antibacterial soap and water prior to dressing change. Wound Cleanser Discharge Instruction: Cleanse the wound with wound cleanser prior to applying a clean dressing using gauze sponges, not tissue or cotton balls. Peri-Wound Care Topical Primary Dressing Santyl Ointment Discharge Instruction: Apply nickel thick amount to wound bed as instructed Secondary Dressing Bordered Gauze, 4x4 in Discharge Instruction: Apply over primary dressing as directed. Secured With Compression Wrap Compression Stockings Sport and exercise psychologist) Signed: 12/14/2021 11:40:20 AM By: Rhae Hammock RN Entered By: Rhae Hammock on 12/12/2021 08:56:22 -------------------------------------------------------------------------------- Wound Assessment Details Patient Name: Date of Service: Jose Robinson, Jose Minion T O. 12/12/2021 7:30 A M Medical Record Number: [id number] Patient Account Number: [id number] Date of Birth/Sex: Treating RN: 05-16-1956 (65 y.o. [name], [name] Primary Care Everton Bertha: PCP, NO Other Clinician: Referring Racquel Arkin: Treating Payson Evrard/Extender: Cheree Ditto in Treatment: 0 Wound Status Wound Number: 3 Primary Diabetic Wound/Ulcer of the Lower Extremity Etiology: Wound Location: Right, Distal Knee Wound Open Wounding Event: Gradually Appeared Status: Date Acquired: 11/12/2021 Comorbid Congestive Heart Failure, Hypertension, Type II Diabetes, End Weeks Of Treatment: 0 History: Stage Renal Disease, Neuropathy Clustered Wound: No Photos Photo Uploaded By: Donavan Burnet on 12/13/2021 09:10:36 Wound Measurements Length: (cm) 2 Width: (cm) 2 Depth: (cm) 0.2 Area: (cm) 3.142 Volume: (cm) 0.628 % Reduction in Area: % Reduction in Volume: Epithelialization: None Tunneling: No Undermining: No Wound Description Classification: Grade 2 Wound Margin: Distinct, outline attached Exudate Amount: Medium Exudate Type: Serosanguineous Exudate Color: red, brown Foul Odor After Cleansing: No Slough/Fibrino Yes Wound Bed Granulation Amount: None Present (0%) Exposed Structure Necrotic Amount: Large (67-100%) Fascia Exposed: No Necrotic Quality: Eschar, Adherent Slough Fat Layer (Subcutaneous Tissue) Exposed: No Tendon Exposed: No Muscle Exposed: No Joint Exposed: No Bone Exposed: No Treatment Notes Wound #3 (Knee) Wound Laterality: Right, Distal Cleanser Soap and Water Discharge Instruction: May shower and wash wound with dial antibacterial soap and water prior to dressing  change. Wound Cleanser Discharge Instruction: Cleanse the wound with wound cleanser prior to applying a clean dressing using gauze sponges, not tissue or cotton balls. Peri-Wound Care Topical Primary Dressing Santyl Ointment Discharge Instruction: Apply nickel thick amount to wound bed as instructed Secondary Dressing Bordered Gauze, 4x4 in Discharge Instruction: Apply over primary dressing as directed. Secured With Compression Wrap Compression Stockings Environmental education officer) Signed: 12/14/2021 11:40:20 AM By: Rhae Hammock RN Entered By: Rhae Hammock on 12/12/2021 08:57:23 -------------------------------------------------------------------------------- Vitals Details Patient Name: Date of Service: Jose Robinson, Jose Minion T O. 12/12/2021 7:30 A M Medical Record Number: [id number] Patient Account Number: [id number] Date of Birth/Sex: Treating RN: [birth year] (65 y.o. Erie Noe Primary Care Vasily Fedewa: PCP, NO Other Clinician: Referring Damiel Barthold: Treating Ryann Pauli/Extender: Cheree Ditto in Treatment: 0 Vital Signs Time Taken: 07:59 Temperature (F): 98.6 Height (in): 71 Pulse (bpm): 90 Source: Stated Respiratory Rate (breaths/min): 17 Weight (lbs): 144 Blood  Pressure (mmHg): 140/93 Source: Stated Capillary Blood Glucose (mg/dl): 118 Body Mass Index (BMI): 20.1 Reference Range: 80 - 120 mg / dl Electronic Signature(s) Signed: 12/14/2021 11:40:20 AM By: Rhae Hammock RN Entered By: Rhae Hammock on 12/12/2021 07:59:47

## 2021-12-14 NOTE — Progress Notes (Signed)
ANEUDY, CHAMPLAIN ([id number]) Visit Report for 12/12/2021 Abuse/Suicide Risk Screen Details Patient Name: Date of Service: Jose Robinson 12/12/2021 7:30 A M Medical Record Number: [id number] Patient Account Number: [id number] Date of Birth/Sex: Treating RN: [birth year] (65 y.o. Erie Noe Primary Care Niklas Chretien: PCP, NO Other Clinician: Referring Adolf Ormiston: Treating Evolett Somarriba/Extender: Cheree Ditto in Treatment: 0 Abuse/Suicide Risk Screen Items Answer ABUSE RISK SCREEN: Has anyone close to you tried to hurt or harm you recentlyo No Do you feel uncomfortable with anyone in your familyo No Has anyone forced you do things that you didnt want to doo No Electronic Signature(s) Signed: 12/14/2021 11:40:20 AM By: Rhae Hammock RN Entered By: Rhae Hammock on 12/12/2021 07:58:04 -------------------------------------------------------------------------------- Activities of Daily Living Details Patient Name: Date of Service: Jose Robinson 12/12/2021 7:30 A M Medical Record Number: [id number] Patient Account Number: [id number] Date of Birth/Sex: Treating RN: [birth year] (65 y.o. Erie Noe Primary Care Berna Gitto: PCP, NO Other Clinician: Referring Chrystal Zeimet: Treating Reet Scharrer/Extender: Cheree Ditto in Treatment: 0 Activities of Daily Living Items Answer Activities of Daily Living (Please select one for each item) Drive Automobile Not Able T Medications ake Completely Able Use T elephone Completely Able Care for Appearance Need Assistance Use T oilet Need Assistance Bath / Shower Completely Able Dress Self Completely Able Feed Self Completely Able Walk Completely Able Get In / Out Bed Completely Able Housework Need Assistance Prepare Meals Need Assistance Handle Money Need Assistance Shop for Self Need Assistance Electronic Signature(s) Signed: 12/14/2021 11:40:20 AM By: Rhae Hammock RN Entered By: Rhae Hammock on  12/12/2021 07:58:40 -------------------------------------------------------------------------------- Education Screening Details Patient Name: Date of Service: Jose Robinson, Jose Minion T O. 12/12/2021 7:30 A M Medical Record Number: [id number] Patient Account Number: [id number] Date of Birth/Sex: Treating RN: 11/03/1956 (65 y.o. Erie Noe Primary Care Jamae Tison: PCP, NO Other Clinician: Referring Minyon Billiter: Treating Kypton Eltringham/Extender: Cheree Ditto in Treatment: 0 Primary Learner Assessed: Patient Learning Preferences/Education Level/Primary Language Learning Preference: Explanation, Demonstration, Communication Board, Printed Material Highest Education Level: High School Preferred Language: English Cognitive Barrier Language Barrier: No Translator Needed: No Memory Deficit: No Emotional Barrier: No Cultural/Religious Beliefs Affecting Medical Care: No Physical Barrier Impaired Vision: No Impaired Hearing: No Decreased Hand dexterity: No Knowledge/Comprehension Knowledge Level: High Comprehension Level: High Ability to understand written instructions: High Ability to understand verbal instructions: High Motivation Anxiety Level: Calm Cooperation: Cooperative Education Importance: Denies Need Interest in Health Problems: Asks Questions Perception: Coherent Willingness to Engage in Self-Management High Activities: Readiness to Engage in Self-Management High Activities: Electronic Signature(s) Signed: 12/14/2021 11:40:20 AM By: Rhae Hammock RN Entered By: Rhae Hammock on 12/12/2021 07:59:01 -------------------------------------------------------------------------------- Fall Risk Assessment Details Patient Name: Date of Service: Jose Robinson, Jose Minion T O. 12/12/2021 7:30 A M Medical Record Number: [id number] Patient Account Number: [id number] Date of Birth/Sex: Treating RN: [birth year] (65 y.o. Jose Robinson, Jose Robinson Primary Care Anandi Abramo: PCP, NO Other  Clinician: Referring Aranza Geddes: Treating Isacc Turney/Extender: Cheree Ditto in Treatment: 0 Fall Risk Assessment Items Have you had 2 or more falls in the last 12 monthso 0 No Have you had any fall that resulted in injury in the last 12 monthso 0 No FALLS RISK SCREEN History of falling - immediate or within 3 months 0 No Secondary diagnosis (Do you have 2 or more medical diagnoseso) 0 No Ambulatory aid None/bed rest/wheelchair/nurse 0 No Crutches/cane/walker 0 No Furniture 0 No Intravenous therapy Access/Saline/Heparin Lock 0 No Gait/Transferring Normal/ bed rest/ wheelchair 0 No Weak (short steps with or  without shuffle, stooped but able to lift head while walking, may seek 0 No support from furniture) Impaired (short steps with shuffle, may have difficulty arising from chair, head down, impaired 0 No balance) Mental Status Oriented to own ability 0 No Electronic Signature(s) Signed: 12/14/2021 11:40:20 AM By: Rhae Hammock RN Entered By: Rhae Hammock on 12/12/2021 07:59:12 -------------------------------------------------------------------------------- Foot Assessment Details Patient Name: Date of Service: Jose Robinson, Jose Minion T O. 12/12/2021 7:30 A M Medical Record Number: [id number] Patient Account Number: [id number] Date of Birth/Sex: Treating RN: 19-Nov-1956 (65 y.o. Erie Noe Primary Care Severa Jeremiah: PCP, NO Other Clinician: Referring Crystelle Ferrufino: Treating Elliette Seabolt/Extender: Cheree Ditto in Treatment: 0 Foot Assessment Items Site Locations + = Sensation present, - = Sensation absent, C = Callus, U = Ulcer R = Redness, W = Warmth, M = Maceration, PU = Pre-ulcerative lesion F = Fissure, S = Swelling, D = Dryness Assessment Right: Left: Other Deformity: No No Prior Foot Ulcer: No No Prior Amputation: Yes No Charcot Joint: No No Ambulatory Status: Ambulatory With Help Assistance Device: Wheelchair Gait: Buyer, retail  Signature(s) Signed: 12/14/2021 11:40:20 AM By: Rhae Hammock RN Entered By: Rhae Hammock on 12/12/2021 08:02:08 -------------------------------------------------------------------------------- Nutrition Risk Screening Details Patient Name: Date of Service: Jose Robinson. 12/12/2021 7:30 A M Medical Record Number: [id number] Patient Account Number: [id number] Date of Birth/Sex: Treating RN: [birth year] (65 y.o. Jose Robinson, Jose Robinson Primary Care Hermon Zea: PCP, NO Other Clinician: Referring Birdena Kingma: Treating Cherika Jessie/Extender: Cheree Ditto in Treatment: 0 Height (in): 71 Weight (lbs): 144 Body Mass Index (BMI): 20.1 Nutrition Risk Screening Items Score Screening NUTRITION RISK SCREEN: I have an illness or condition that made me change the kind and/or amount of food I eat 0 No I eat fewer than two meals per day 0 No I eat few fruits and vegetables, or milk products 0 No I have three or more drinks of beer, liquor or wine almost every day 0 No I have tooth or mouth problems that make it hard for me to eat 0 No I don't always have enough money to buy the food I need 0 No I eat alone most of the time 0 No I take three or more different prescribed or over-the-counter drugs a day 0 No Without wanting to, I have lost or gained 10 pounds in the last six months 0 No I am not always physically able to shop, cook and/or feed myself 0 No Nutrition Protocols Good Risk Protocol 0 No interventions needed Moderate Risk Protocol High Risk Proctocol Risk Level: Good Risk Score: 0 Electronic Signature(s) Signed: 12/14/2021 11:40:20 AM By: Rhae Hammock RN Entered By: Rhae Hammock on 12/12/2021 08:00:45

## 2021-12-14 NOTE — Progress Notes (Signed)
Jose Robinson ([id number]) Visit Report for 12/12/2021 Chief Complaint Document Details Patient Name: Date of Service: Jose Robinson 12/12/2021 7:30 A M Medical Record Number: [id number] Patient Account Number: [id number] Date of Birth/Sex: Treating RN: [birth year] (65 y.o. Janyth Contes Primary Care Provider: PCP, NO Other Clinician: Referring Provider: Treating Provider/Extender: Cheree Ditto in Treatment: 0 Information Obtained from: Patient Chief Complaint 12/12/2021; patient is here for review of wounds on his bilateral lower extremity Electronic Signature(s) Signed: 12/12/2021 4:51:07 PM By: Linton Ham MD Entered By: Linton Ham on 12/12/2021 09:06:09 -------------------------------------------------------------------------------- Debridement Details Patient Name: Date of Service: Jose Robinson, [name]. 12/12/2021 7:30 A M Medical Record Number: [id number] Patient Account Number: [id number] Date of Birth/Sex: Treating RN: April 10, 1956 (65 y.o. [name], [name] Primary Care Provider: PCP, NO Other Clinician: Referring Provider: Treating Provider/Extender: Cheree Ditto in Treatment: 0 Debridement Performed for Assessment: Wound #1 Left Knee Performed By: Physician Ricard Dillon., MD Debridement Type: Debridement Severity of Tissue Pre Debridement: Fat layer exposed Level of Consciousness (Pre-procedure): Awake and Alert Pre-procedure Verification/Time Out Yes - 09:00 Taken: Start Time: 09:00 Pain Control: Lidocaine T Area Debrided (L x W): otal 3.7 (cm) x 0.9 (cm) = 3.33 (cm) Tissue and other material debrided: Viable, Non-Viable, Slough, Subcutaneous, Skin: Dermis , Skin: Epidermis, Slough Level: Skin/Subcutaneous Tissue Debridement Description: Excisional Instrument: Curette Bleeding: Minimum Hemostasis Achieved: Pressure End Time: 09:00 Procedural Pain: 0 Post Procedural Pain: 0 Response to Treatment: Procedure was  tolerated well Level of Consciousness (Post- Awake and Alert procedure): Post Debridement Measurements of Total Wound Length: (cm) 3.7 Width: (cm) 0.9 Depth: (cm) 0.1 Volume: (cm) 0.262 Character of Wound/Ulcer Post Debridement: Improved Severity of Tissue Post Debridement: Fat layer exposed Post Procedure Diagnosis Same as Pre-procedure Electronic Signature(s) Signed: 12/12/2021 4:51:07 PM By: Linton Ham MD Signed: 12/14/2021 11:40:20 AM By: Rhae Hammock RN Entered By: Rhae Hammock on 12/12/2021 09:06:25 -------------------------------------------------------------------------------- Debridement Details Patient Name: Date of Service: Jose Robinson, [name]. 12/12/2021 7:30 A M Medical Record Number: [id number] Patient Account Number: [id number] Date of Birth/Sex: Treating RN: 1956-09-28 (65 y.o. [name], [name] Primary Care Provider: PCP, NO Other Clinician: Referring Provider: Treating Provider/Extender: Cheree Ditto in Treatment: 0 Debridement Performed for Assessment: Wound #2 Right Knee Performed By: Physician Ricard Dillon., MD Debridement Type: Debridement Severity of Tissue Pre Debridement: Fat layer exposed Level of Consciousness (Pre-procedure): Awake and Alert Pre-procedure Verification/Time Out Yes - 09:00 Taken: Start Time: 09:00 Pain Control: Lidocaine T Area Debrided (L x W): otal 0.7 (cm) x 0.7 (cm) = 0.49 (cm) Tissue and other material debrided: Viable, Non-Viable, Slough, Subcutaneous, Skin: Dermis , Skin: Epidermis, Slough Level: Skin/Subcutaneous Tissue Debridement Description: Excisional Instrument: Curette Bleeding: Minimum Hemostasis Achieved: Pressure End Time: 09:00 Procedural Pain: 0 Post Procedural Pain: 0 Response to Treatment: Procedure was tolerated well Level of Consciousness (Post- Awake and Alert procedure): Post Debridement Measurements of Total Wound Length: (cm) 0.7 Width: (cm) 0.7 Depth: (cm)  0.2 Volume: (cm) 0.077 Character of Wound/Ulcer Post Debridement: Improved Severity of Tissue Post Debridement: Fat layer exposed Post Procedure Diagnosis Same as Pre-procedure Electronic Signature(s) Signed: 12/12/2021 4:51:07 PM By: Linton Ham MD Signed: 12/14/2021 11:40:20 AM By: Rhae Hammock RN Entered By: Rhae Hammock on 12/12/2021 09:06:56 -------------------------------------------------------------------------------- Debridement Details Patient Name: Date of Service: Jose Robinson, [name]. 12/12/2021 7:30 A M Medical Record Number: [id number] Patient Account Number: [id number] Date of Birth/Sex: Treating RN: 1956/07/08 (65 y.o. Erie Noe Primary Care Provider: PCP,  NO Other Clinician: Referring Provider: Treating Provider/Extender: Cheree Ditto in Treatment: 0 Debridement Performed for Assessment: Wound #3 Right,Distal Knee Performed By: Physician Ricard Dillon., MD Debridement Type: Debridement Severity of Tissue Pre Debridement: Fat layer exposed Level of Consciousness (Pre-procedure): Awake and Alert Pre-procedure Verification/Time Out Yes - 09:00 Taken: Start Time: 09:00 Pain Control: Lidocaine T Area Debrided (L x W): otal 2 (cm) x 2 (cm) = 4 (cm) Tissue and other material debrided: Viable, Non-Viable, Slough, Subcutaneous, Skin: Dermis , Skin: Epidermis, Slough Level: Skin/Subcutaneous Tissue Debridement Description: Excisional Instrument: Curette Bleeding: Minimum Hemostasis Achieved: Pressure End Time: 09:00 Procedural Pain: 0 Post Procedural Pain: 0 Response to Treatment: Procedure was tolerated well Level of Consciousness (Post- Awake and Alert procedure): Post Debridement Measurements of Total Wound Length: (cm) 2 Width: (cm) 2 Depth: (cm) 0.2 Volume: (cm) 0.628 Character of Wound/Ulcer Post Debridement: Improved Severity of Tissue Post Debridement: Fat layer exposed Post Procedure Diagnosis Same as  Pre-procedure Electronic Signature(s) Signed: 12/12/2021 4:51:07 PM By: Linton Ham MD Signed: 12/14/2021 11:40:20 AM By: Rhae Hammock RN Entered By: Rhae Hammock on 12/12/2021 09:07:23 -------------------------------------------------------------------------------- HPI Details Patient Name: Date of Service: Jose Robinson, [name]. 12/12/2021 7:30 A M Medical Record Number: [id number] Patient Account Number: [id number] Date of Birth/Sex: Treating RN: 1956/04/05 (65 y.o. Janyth Contes Primary Care Provider: PCP, NO Other Clinician: Referring Provider: Treating Provider/Extender: Cheree Ditto in Treatment: 0 History of Present Illness HPI Description: ADMISSION 12/12/2021 This is a 65 year old man who is a type II diabetic likely with multiple complications. He is self-referred for wounds on his right patella and right posterior patellar tendon also on the left patella. 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 He is a type II diabetic. The patient states the wounds were there for the last 3 to 4 weeks a simply broke open. He complains of pain in the right leg. He was in the ER on October 26 x-rays of the left knee were done that did not show any abnormalities. He was diagnosed with prepatellar bursitis. 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 fib. He was at a wound care center in [facility] I believe in 2016 with a wound on his left leg. Notable that he had a right BKA at [facility]. Presumably he had arterial studies but I could not put my eyes on these today ABI in the left Electronic Signature(s) Signed: 12/12/2021 4:51:07 PM By: Linton Ham MD Entered  By: Linton Ham on 12/12/2021 09:11:45 -------------------------------------------------------------------------------- Physical Exam Details Patient Name: Date of Service: Jose Robinson, [name]. 12/12/2021 7:30 A M Medical Record Number: [id number] Patient Account Number: [id number] Date of Birth/Sex: Treating RN: 1956-01-29 (65 y.o. Janyth Contes Primary Care Provider: PCP, NO Other Clinician: Referring Provider: Treating Provider/Extender: Cheree Ditto in Treatment: 0 Constitutional Patient is hypertensive.. Pulse regular and within target range for patient.Marland Kitchen Respirations regular, non-labored and within target range.. Temperature is normal and within the target range for the patient.Marland Kitchen Appears in no distress. Cardiovascular Pedal pulses absent on the left, popliteal pulses absent on the left. On the right he has strong inguinal pulses and a strong popliteal pulse. Notes Wound exam; the  patient has superficial wounds on the right patella and the right patellar tendon. Both of these covered in a fibrinous eschar which I attempted debridement with a #5 curette. On the left he has several small areas on the left patella also debrided. There was minimal bleeding. Electronic Signature(s) Signed: 12/12/2021 4:51:07 PM By: Linton Ham MD Entered By: Linton Ham on 12/12/2021 09:13:08 -------------------------------------------------------------------------------- Physician Orders Details Patient Name: Date of Service: Jose Robinson, [name]. 12/12/2021 7:30 A M Medical Record Number: [id number] Patient Account Number: [id number] Date of Birth/Sex: Treating RN: 1956/04/20 (65 y.o. Erie Noe Primary Care Provider: PCP, NO Other Clinician: Referring Provider: Treating Provider/Extender: Cheree Ditto in Treatment: 0 Verbal / Phone Orders: No Diagnosis Coding Follow-up Appointments ppointment in 1 week. - Dr. Dellia Nims Return A Bathing/ Shower/ Hygiene May  shower with protection but do not get wound dressing(s) wet. Edema Control - Lymphedema / SCD / Other Elevate legs to the level of the heart or above for 30 minutes daily and/or when sitting, a frequency of: Avoid standing for long periods of time. Wound Treatment Wound #1 - Knee Wound Laterality: Left Cleanser: Soap and Water 1 x Per RJJ/88 Days Discharge Instructions: May shower and wash wound with dial antibacterial soap and water prior to dressing change. Cleanser: Wound Cleanser (DME) (Generic) 1 x Per Day/15 Days Discharge Instructions: Cleanse the wound with wound cleanser prior to applying a clean dressing using gauze sponges, not tissue or cotton balls. Prim Dressing: Santyl Ointment 1 x Per Day/15 Days ary Discharge Instructions: Apply nickel thick amount to wound bed as instructed Secondary Dressing: Bordered Gauze, 4x4 in 1 x Per Day/15 Days Discharge Instructions: Apply over primary dressing as directed. Wound #2 - Knee Wound Laterality: Right Cleanser: Soap and Water 1 x Per CZY/60 Days Discharge Instructions: May shower and wash wound with dial antibacterial soap and water prior to dressing change. Cleanser: Wound Cleanser (DME) (Generic) 1 x Per Day/15 Days Discharge Instructions: Cleanse the wound with wound cleanser prior to applying a clean dressing using gauze sponges, not tissue or cotton balls. Prim Dressing: Santyl Ointment 1 x Per Day/15 Days ary Discharge Instructions: Apply nickel thick amount to wound bed as instructed Secondary Dressing: Bordered Gauze, 4x4 in 1 x Per Day/15 Days Discharge Instructions: Apply over primary dressing as directed. Wound #3 - Knee Wound Laterality: Right, Distal Cleanser: Soap and Water 1 x Per YTK/16 Days Discharge Instructions: May shower and wash wound with dial antibacterial soap and water prior to dressing change. Cleanser: Wound Cleanser (DME) (Generic) 1 x Per Day/15 Days Discharge Instructions: Cleanse the wound with wound  cleanser prior to applying a clean dressing using gauze sponges, not tissue or cotton balls. Prim Dressing: Santyl Ointment 1 x Per Day/15 Days ary Discharge Instructions: Apply nickel thick amount to wound bed as instructed Secondary Dressing: Bordered Gauze, 4x4 in 1 x Per Day/15 Days Discharge Instructions: Apply over primary dressing as directed. Services and Therapies nkle Brachial Index (ABI) - Left Leg A Toe pressures (TBI) - Left foot [employer] rterial doppler - leg leg and right BKA A Electronic Signature(s) Signed: 12/12/2021 4:51:07 PM By: Linton Ham MD Signed: 12/14/2021 11:40:20 AM By: Rhae Hammock RN Entered By: Rhae Hammock on 12/12/2021 09:04:37 -------------------------------------------------------------------------------- Problem List Details Patient Name: Date of Service: Jose Robinson, [name]. 12/12/2021 7:30 A M Medical Record Number: [id number] Patient Account Number: [id number] Date of Birth/Sex: Treating RN: 1956-03-13 (65 y.o. Janyth Contes Primary Care Provider: PCP, NO  Other Clinician: Referring Provider: Treating Provider/Extender: Cheree Ditto in Treatment: 0 Active Problems ICD-10 Encounter Code Description Active Date MDM Code Description Active Date MDM Diagnosis E11.51 Type 2 diabetes mellitus with diabetic peripheral angiopathy without gangrene 12/12/2021 No Yes L97.818 Non-pressure chronic ulcer of other part of right lower leg with other specified 12/12/2021 No Yes severity L97.828 Non-pressure chronic ulcer of other part of left lower leg with other specified 12/12/2021 No Yes severity Inactive Problems Resolved Problems Electronic Signature(s) Signed: 12/12/2021 4:51:07 PM By: Linton Ham MD Entered By: Linton Ham on 12/12/2021 09:04:44 -------------------------------------------------------------------------------- Progress Note Details Patient Name: Date of Service: Jose Robinson, [name].  12/12/2021 7:30 A M Medical Record Number: [id number] Patient Account Number: [id number] Date of Birth/Sex: Treating RN: [birth year] (65 y.o. Janyth Contes Primary Care Provider: PCP, NO Other Clinician: Referring Provider: Treating Provider/Extender: Cheree Ditto in Treatment: 0 Subjective Chief Complaint Information obtained from Patient 12/12/2021; patient is here for review of wounds on his bilateral lower extremity History of Present Illness (HPI) ADMISSION 12/12/2021 This is a 65 year old man who is a type II diabetic likely with multiple complications. He is self-referred for wounds on his right patella and right posterior patellar tendon also on the left patella. 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 He is a type II diabetic. The patient states the wounds were there for the last 3 to 4 weeks a simply broke open. He complains of pain in the right leg. He was in the ER on October 26 x-rays of the left knee were done that did not show any abnormalities. He was diagnosed with prepatellar bursitis. 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 fib. He was at a wound care center in [facility] I believe in 2016 with a wound on his left leg. Notable that he had a right BKA at [facility]. Presumably he had arterial studies but I could not put my eyes on these today ABI in the left Patient History Information obtained from Patient, Chart. Allergies Iodinated Contrast Media Family History Unknown History. Social History Current every day smoker, Marital Status - Single, Alcohol Use - Never, Drug Use - No History, Caffeine Use - Moderate. Medical History Eyes Denies  history of Cataracts, Glaucoma, Optic Neuritis Ear/Nose/Mouth/Throat Denies history of Chronic sinus problems/congestion, Middle ear problems Hematologic/Lymphatic Denies history of Anemia, Hemophilia, Human Immunodeficiency Virus, Lymphedema, Sickle Cell Disease Respiratory Denies history of Aspiration, Asthma, Chronic Obstructive Pulmonary Disease (COPD), Pneumothorax, Sleep Apnea, Tuberculosis Cardiovascular Patient has history of Congestive Heart Failure, Hypertension Denies history of Angina, Arrhythmia, Coronary Artery Disease, Deep Vein Thrombosis, Hypotension, Myocardial Infarction, Peripheral Arterial Disease, Peripheral Venous Disease, Phlebitis, Vasculitis Gastrointestinal Denies history of Cirrhosis , Colitis, Crohnoos, Hepatitis A, Hepatitis B, Hepatitis C Endocrine Patient has history of Type II Diabetes Denies history of Type I Diabetes Genitourinary Patient has history of End Stage Renal Disease Integumentary (Skin) Denies history of History of Burn Neurologic Patient has history of Neuropathy Denies history of Dementia, Quadriplegia, Paraplegia, Seizure Disorder Hospitalization/Surgery History - back surgery. - BKA. Medical A Surgical History Notes nd Genitourinary hemodialysis Review of Systems (ROS) Constitutional Symptoms (General Health) Denies complaints or symptoms of Fatigue, Fever, Chills,  Marked Weight Change. Eyes Denies complaints or symptoms of Dry Eyes, Vision Changes, Glasses / Contacts. Ear/Nose/Mouth/Throat Denies complaints or symptoms of Chronic sinus problems or rhinitis. Respiratory Denies complaints or symptoms of Chronic or frequent coughs, Shortness of Breath. Cardiovascular Denies complaints or symptoms of Chest pain. Gastrointestinal Denies complaints or symptoms of Frequent diarrhea, Nausea, Vomiting. Endocrine Denies complaints or symptoms of Heat/cold intolerance. Genitourinary Denies complaints or symptoms of Frequent  urination. Integumentary (Skin) Complains or has symptoms of Wounds. Musculoskeletal Denies complaints or symptoms of Muscle Pain, Muscle Weakness. Psychiatric Denies complaints or symptoms of Claustrophobia, Suicidal. Objective Constitutional Patient is hypertensive.. Pulse regular and within target range for patient.Marland Kitchen Respirations regular, non-labored and within target range.. Temperature is normal and within the target range for the patient.Marland Kitchen Appears in no distress. Vitals Time Taken: 7:59 AM, Height: 71 in, Source: Stated, Weight: 144 lbs, Source: Stated, BMI: 20.1, Temperature: 98.6 F, Pulse: 90 bpm, Respiratory Rate: 17 breaths/min, Blood Pressure: 140/93 mmHg, Capillary Blood Glucose: 118 mg/dl. Cardiovascular Pedal pulses absent on the left, popliteal pulses absent on the left. On the right he has strong inguinal pulses and a strong popliteal pulse. General Notes: Wound exam; the patient has superficial wounds on the right patella and the right patellar tendon. Both of these covered in a fibrinous eschar which I attempted debridement with a #5 curette. On the left he has several small areas on the left patella also debrided. There was minimal bleeding. Integumentary (Hair, Skin) Wound #1 status is Open. Original cause of wound was Gradually Appeared. The date acquired was: 11/12/2021. The wound is located on the Left Knee. The wound measures 3.7cm length x 0.9cm width x 0.1cm depth; 2.615cm^2 area and 0.262cm^3 volume. There is no tunneling or undermining noted. There is a medium amount of serosanguineous drainage noted. The wound margin is distinct with the outline attached to the wound base. There is no granulation within the wound bed. There is a large (67-100%) amount of necrotic tissue within the wound bed including Eschar and Adherent Slough. Wound #2 status is Open. Original cause of wound was Gradually Appeared. The date acquired was: 11/12/2021. The wound is located on the  Right Knee. The wound measures 0.7cm length x 0.7cm width x 0.2cm depth; 0.385cm^2 area and 0.077cm^3 volume. There is no tunneling or undermining noted. There is a medium amount of serosanguineous drainage noted. The wound margin is distinct with the outline attached to the wound base. There is no granulation within the wound bed. There is a large (67-100%) amount of necrotic tissue within the wound bed including Eschar and Adherent Slough. Wound #3 status is Open. Original cause of wound was Gradually Appeared. The date acquired was: 11/12/2021. The wound is located on the Right,Distal Knee. The wound measures 2cm length x 2cm width x 0.2cm depth; 3.142cm^2 area and 0.628cm^3 volume. There is no tunneling or undermining noted. There is a medium amount of serosanguineous drainage noted. The wound margin is distinct with the outline attached to the wound base. There is no granulation within the wound bed. There is a large (67-100%) amount of necrotic tissue within the wound bed including Eschar and Adherent Slough. 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 There was a Excisional Skin/Subcutaneous Tissue Debridement with a total area of 3.33 sq cm performed by Ricard Dillon., MD. With the following instrument(s): Curette to remove Viable and Non-Viable tissue/material. Material removed includes Subcutaneous Tissue, Slough, Skin: Dermis, and Skin: Epidermis after achieving pain control using Lidocaine. No specimens were taken. A time out was conducted at 09:00,  prior to the start of the procedure. A Minimum amount of bleeding was controlled with Pressure. The procedure was tolerated well with a pain level of 0 throughout and a pain level of 0 following the procedure. Post Debridement Measurements: 3.7cm length x 0.9cm width x 0.1cm depth; 0.262cm^3 volume. Character of Wound/Ulcer Post Debridement is improved. Severity of Tissue Post Debridement is: Fat layer exposed. Post procedure Diagnosis Wound #1: Same as Pre-Procedure Wound #2 Pre-procedure diagnosis of Wound #2 is a Diabetic Wound/Ulcer of the Lower Extremity located on the Right Knee .Severity of Tissue Pre Debridement is: Fat layer exposed. There was a Excisional Skin/Subcutaneous Tissue Debridement with a total area of 0.49 sq cm performed by Ricard Dillon., MD. With the following instrument(s): Curette to remove Viable and Non-Viable tissue/material. Material removed includes Subcutaneous Tissue, Slough, Skin: Dermis, and Skin: Epidermis after achieving pain control using Lidocaine. No specimens were taken. A time out was conducted at 09:00, prior to the start of the procedure. A Minimum amount of bleeding was controlled with Pressure. The procedure was tolerated well with a pain level of 0 throughout and a pain level of 0 following the procedure. Post Debridement Measurements: 0.7cm length x 0.7cm width x 0.2cm depth; 0.077cm^3 volume. Character of Wound/Ulcer Post Debridement is improved. Severity of Tissue Post Debridement is: Fat layer exposed. Post procedure Diagnosis Wound #2: Same as Pre-Procedure Wound #3 Pre-procedure diagnosis of Wound #3 is a Diabetic Wound/Ulcer of the Lower Extremity located on the Right,Distal Knee .Severity of Tissue Pre Debridement is: Fat layer exposed. There was a Excisional Skin/Subcutaneous Tissue Debridement with a total area of 4 sq cm performed by Ricard Dillon., MD. With the following instrument(s): Curette to remove Viable and Non-Viable  tissue/material. Material removed includes Subcutaneous Tissue, Slough, Skin: Dermis, and Skin: Epidermis after achieving pain control using Lidocaine. No specimens were taken. A time out was conducted at 09:00, prior to the start of the procedure. A Minimum amount of bleeding was controlled with Pressure. The procedure was tolerated well with a pain level of 0 throughout and a pain level of 0 following the procedure. Post Debridement Measurements: 2cm length x 2cm width x 0.2cm depth; 0.628cm^3 volume. Character of Wound/Ulcer Post Debridement is improved. Severity of Tissue Post Debridement is: Fat layer exposed. Post procedure Diagnosis Wound #3: Same as Pre-Procedure Plan Follow-up Appointments: Return Appointment in 1 week. - Dr. Dellia Nims Bathing/ Shower/ Hygiene: May shower with protection but do not get wound dressing(s) wet. Edema Control - Lymphedema / SCD / Other: Elevate legs to the level of the heart or above for 30 minutes daily and/or when sitting, a frequency of: Avoid standing for long periods of time. Services and Therapies ordered were: Ankle Brachial Index (ABI) - Left Leg, T pressures (TBI) - Left foot oe ordered were: Arterial doppler - leg leg and right BKA WOUND #1: - Knee Wound Laterality: Left Cleanser: Soap and Water 1 x Per Day/15 Days Discharge Instructions: May shower and wash wound with dial antibacterial soap and water prior to dressing change. Cleanser: Wound  Cleanser (DME) (Generic) 1 x Per Day/15 Days Discharge Instructions: Cleanse the wound with wound cleanser prior to applying a clean dressing using gauze sponges, not tissue or cotton balls. Prim Dressing: Santyl Ointment 1 x Per Day/15 Days ary Discharge Instructions: Apply nickel thick amount to wound bed as instructed Secondary Dressing: Bordered Gauze, 4x4 in 1 x Per Day/15 Days Discharge Instructions: Apply over primary dressing as directed. WOUND #2: - Knee Wound Laterality: Right Cleanser: Soap  and Water 1 x Per NGE/95 Days Discharge Instructions: May shower and wash wound with dial antibacterial soap and water prior to dressing change. Cleanser: Wound Cleanser (DME) (Generic) 1 x Per Day/15 Days Discharge Instructions: Cleanse the wound with wound cleanser prior to applying a clean dressing using gauze sponges, not tissue or cotton balls. Prim Dressing: Santyl Ointment 1 x Per Day/15 Days ary Discharge Instructions: Apply nickel thick amount to wound bed as instructed Secondary Dressing: Bordered Gauze, 4x4 in 1 x Per Day/15 Days Discharge Instructions: Apply over primary dressing as directed. WOUND #3: - Knee Wound Laterality: Right, Distal Cleanser: Soap and Water 1 x Per MWU/13 Days Discharge Instructions: May shower and wash wound with dial antibacterial soap and water prior to dressing change. Cleanser: Wound Cleanser (DME) (Generic) 1 x Per Day/15 Days Discharge Instructions: Cleanse the wound with wound cleanser prior to applying a clean dressing using gauze sponges, not tissue or cotton balls. Prim Dressing: Santyl Ointment 1 x Per Day/15 Days ary Discharge Instructions: Apply nickel thick amount to wound bed as instructed Secondary Dressing: Bordered Gauze, 4x4 in 1 x Per Day/15 Days Discharge Instructions: Apply over primary dressing as directed. 1. Patient has bilateral wounds as described. All of them and a fibrinous eschar. I tried debriding these but he did not tolerate this very well. 2. I suspect that PAD is playing a major role in this. He will need arterial Dopplers bilaterally ABIs and TBI's on the left 3. He is in a [facility] assisted living in [place]. He does not have home health I have written a prescription for Santyl change daily although I am not sure whether his insurance will cover this. If not we will use Medihoney change daily. 4. He is not using his prosthesis I asked him about this several times. I told him this would not heal if he attempts  to walk on this. 5. Continued cigarette smoker we talked about this. I note that he has had bilateral fifth digit upper extremity amputations 6. I was not able to find arterial studies at [facility] although I will try to have a better look at this. Presumably he might of had an angiogram I could not see this either. I spent 40 minutes in review of this patient's past medical history, face-to-face evaluation and preparation of this record Electronic Signature(s) Signed: 12/12/2021 4:51:07 PM By: Linton Ham MD Entered By: Linton Ham on 12/12/2021 09:15:38 -------------------------------------------------------------------------------- HxROS Details Patient Name: Date of Service: Jose Robinson, [name]. 12/12/2021 7:30 A M Medical Record Number: [id number] Patient Account Number: [id number] Date of Birth/Sex: Treating RN: Nov 15, 1956 (65 y.o. Erie Noe Primary Care Provider: PCP, NO Other Clinician: Referring Provider: Treating Provider/Extender: Cheree Ditto in Treatment: 0 Information Obtained From Patient Chart Constitutional Symptoms (General Health) Complaints and Symptoms: Negative for: Fatigue; Fever; Chills; Marked Weight Change Eyes Complaints and Symptoms: Negative for: Dry Eyes; Vision Changes; Glasses / Contacts Medical History: Negative for: Cataracts; Glaucoma; Optic Neuritis Ear/Nose/Mouth/Throat Complaints and Symptoms: Negative for: Chronic sinus problems  or rhinitis Medical History: Negative for: Chronic sinus problems/congestion; Middle ear problems Respiratory Complaints and Symptoms: Negative for: Chronic or frequent coughs; Shortness of Breath Medical History: Negative for: Aspiration; Asthma; Chronic Obstructive Pulmonary Disease (COPD); Pneumothorax; Sleep Apnea; Tuberculosis Cardiovascular Complaints and Symptoms: Negative for: Chest pain Medical History: Positive for: Congestive Heart Failure; Hypertension Negative for: Angina;  Arrhythmia; Coronary Artery Disease; Deep Vein Thrombosis; Hypotension; Myocardial Infarction; Peripheral Arterial Disease; Peripheral Venous Disease; Phlebitis; Vasculitis Gastrointestinal Complaints and Symptoms: Negative for: Frequent diarrhea; Nausea; Vomiting Medical History: Negative for: Cirrhosis ; Colitis; Crohns; Hepatitis A; Hepatitis B; Hepatitis C Endocrine Complaints and Symptoms: Negative for: Heat/cold intolerance Medical History: Positive for: Type II Diabetes Negative for: Type I Diabetes Genitourinary Complaints and Symptoms: Negative for: Frequent urination Medical History: Positive for: End Stage Renal Disease Past Medical History Notes: hemodialysis Integumentary (Skin) Complaints and Symptoms: Positive for: Wounds Medical History: Negative for: History of Burn Musculoskeletal Complaints and Symptoms: Negative for: Muscle Pain; Muscle Weakness Psychiatric Complaints and Symptoms: Negative for: Claustrophobia; Suicidal Hematologic/Lymphatic Medical History: Negative for: Anemia; Hemophilia; Human Immunodeficiency Virus; Lymphedema; Sickle Cell Disease Immunological Neurologic Medical History: Positive for: Neuropathy Negative for: Dementia; Quadriplegia; Paraplegia; Seizure Disorder Oncologic Immunizations Pneumococcal Vaccine: Received Pneumococcal Vaccination: No Implantable Devices None Hospitalization / Surgery History Type of Hospitalization/Surgery back surgery BKA Family and Social History Unknown History: Yes; Current every day smoker; Marital Status - Single; Alcohol Use: Never; Drug Use: No History; Caffeine Use: Moderate; Financial Concerns: No; Food, Clothing or Shelter Needs: No; Support System Lacking: No; Transportation Concerns: No Electronic Signature(s) Signed: 12/12/2021 4:51:07 PM By: Linton Ham MD Signed: 12/14/2021 11:40:20 AM By: Rhae Hammock RN Entered By: Rhae Hammock on 12/12/2021  08:15:52 -------------------------------------------------------------------------------- SuperBill Details Patient Name: Date of Service: Jose Robinson, [name] 12/12/2021 Medical Record Number: [id number] Patient Account Number: [id number] Date of Birth/Sex: Treating RN: 09-May-1956 (65 y.o. [name], [name] Primary Care Provider: PCP, NO Other Clinician: Referring Provider: Treating Provider/Extender: Cheree Ditto in Treatment: 0 Diagnosis Coding ICD-10 Codes Code Description E11.51 Type 2 diabetes mellitus with diabetic peripheral angiopathy without gangrene L97.818 Non-pressure chronic ulcer of other part of right lower leg with other specified severity L97.828 Non-pressure chronic ulcer of other part of left lower leg with other specified severity Facility Procedures CPT4 Code: 80881103 Description: 15945 - WOUND CARE VISIT-LEV 5 NEW PT Modifier: Quantity: 1 CPT4 Code: [id number] Description: 62863 - DEB SUBQ TISSUE 20 SQ CM/< ICD-10 Diagnosis Description L97.818 Non-pressure chronic ulcer of other part of right lower leg with other specified L97.828 Non-pressure chronic ulcer of other part of left lower leg with other specified  s Modifier: severity everity Quantity: 1 Physician Procedures : CPT4 Code Description Modifier [id number] - WC PHYS LEVEL 4 - NEW PT 25 ICD-10 Diagnosis Description E11.51 Type 2 diabetes mellitus with diabetic peripheral angiopathy without gangrene L97.818 Non-pressure chronic ulcer of other part of right lower  leg with other specified severity L97.828 Non-pressure chronic ulcer of other part of left lower leg with other specified severity Quantity: 1 : [id number] 11042 - WC PHYS SUBQ TISS 20 SQ CM ICD-10 Diagnosis Description L97.818 Non-pressure chronic ulcer of other part of right lower leg with other specified severity L97.828 Non-pressure chronic ulcer of other part of left lower leg with other  specified severity Quantity:  1 Electronic Signature(s) Signed: 12/12/2021 4:51:07 PM By: Linton Ham MD Entered By: Linton Ham on 12/12/2021 09:16:05

## 2021-12-18 ENCOUNTER — Other Ambulatory Visit: Payer: Self-pay

## 2021-12-18 ENCOUNTER — Encounter (HOSPITAL_BASED_OUTPATIENT_CLINIC_OR_DEPARTMENT_OTHER): Payer: Medicare (Managed Care) | Admitting: Internal Medicine

## 2021-12-18 DIAGNOSIS — E11622 Type 2 diabetes mellitus with other skin ulcer: Secondary | ICD-10-CM | POA: Diagnosis not present

## 2021-12-19 ENCOUNTER — Encounter (HOSPITAL_BASED_OUTPATIENT_CLINIC_OR_DEPARTMENT_OTHER): Payer: Medicare (Managed Care) | Admitting: Physician Assistant

## 2022-01-05 ENCOUNTER — Other Ambulatory Visit: Payer: Self-pay

## 2022-01-05 DIAGNOSIS — I739 Peripheral vascular disease, unspecified: Secondary | ICD-10-CM

## 2022-01-16 ENCOUNTER — Other Ambulatory Visit: Payer: Self-pay

## 2022-01-16 ENCOUNTER — Encounter (HOSPITAL_BASED_OUTPATIENT_CLINIC_OR_DEPARTMENT_OTHER): Payer: Medicare (Managed Care) | Attending: Internal Medicine | Admitting: Internal Medicine

## 2022-01-16 DIAGNOSIS — Z992 Dependence on renal dialysis: Secondary | ICD-10-CM | POA: Diagnosis not present

## 2022-01-16 DIAGNOSIS — Z89511 Acquired absence of right leg below knee: Secondary | ICD-10-CM | POA: Diagnosis not present

## 2022-01-16 DIAGNOSIS — I2510 Atherosclerotic heart disease of native coronary artery without angina pectoris: Secondary | ICD-10-CM | POA: Insufficient documentation

## 2022-01-16 DIAGNOSIS — E1122 Type 2 diabetes mellitus with diabetic chronic kidney disease: Secondary | ICD-10-CM | POA: Insufficient documentation

## 2022-01-16 DIAGNOSIS — J449 Chronic obstructive pulmonary disease, unspecified: Secondary | ICD-10-CM | POA: Insufficient documentation

## 2022-01-16 DIAGNOSIS — E11622 Type 2 diabetes mellitus with other skin ulcer: Secondary | ICD-10-CM | POA: Diagnosis present

## 2022-01-16 DIAGNOSIS — I480 Paroxysmal atrial fibrillation: Secondary | ICD-10-CM | POA: Diagnosis not present

## 2022-01-16 DIAGNOSIS — L97828 Non-pressure chronic ulcer of other part of left lower leg with other specified severity: Secondary | ICD-10-CM | POA: Diagnosis not present

## 2022-01-16 DIAGNOSIS — N186 End stage renal disease: Secondary | ICD-10-CM | POA: Diagnosis not present

## 2022-01-16 DIAGNOSIS — G4730 Sleep apnea, unspecified: Secondary | ICD-10-CM | POA: Diagnosis not present

## 2022-01-16 DIAGNOSIS — E1151 Type 2 diabetes mellitus with diabetic peripheral angiopathy without gangrene: Secondary | ICD-10-CM | POA: Diagnosis not present

## 2022-01-16 DIAGNOSIS — I429 Cardiomyopathy, unspecified: Secondary | ICD-10-CM | POA: Insufficient documentation

## 2022-01-16 DIAGNOSIS — L97818 Non-pressure chronic ulcer of other part of right lower leg with other specified severity: Secondary | ICD-10-CM | POA: Insufficient documentation

## 2022-01-16 DIAGNOSIS — F17210 Nicotine dependence, cigarettes, uncomplicated: Secondary | ICD-10-CM | POA: Diagnosis not present

## 2022-01-16 NOTE — Progress Notes (Signed)
NASIF, BOS ([id number]) Visit Report for 01/16/2022 Debridement Details Patient Name: Date of Service: Jose Robinson 01/16/2022 10:15 A M Medical Record Number: [id number] Patient Account Number: [id number] Date of Birth/Sex: Treating RN: 1956/01/13 (66 y.o. [name], [name] Primary Care Provider: PCP, NO Other Clinician: Referring Provider: Treating Provider/Extender: Cheree Ditto in Treatment: 5 Debridement Performed for Assessment: Wound #1 Left Knee Performed By: Physician Ricard Dillon., MD Debridement Type: Debridement Severity of Tissue Pre Debridement: Fat layer exposed Level of Consciousness (Pre-procedure): Awake and Alert Pre-procedure Verification/Time Out Yes - 10:55 Taken: Start Time: 10:56 Pain Control: Other : benzocaine 20% spray T Area Debrided (L x W): otal 1.5 (cm) x 0.7 (cm) = 1.05 (cm) Tissue and other material debrided: Viable, Non-Viable, Slough, Subcutaneous, Slough Level: Skin/Subcutaneous Tissue Debridement Description: Excisional Instrument: Curette Bleeding: Minimum Hemostasis Achieved: Pressure Procedural Pain: 5 Post Procedural Pain: 3 Response to Treatment: Procedure was tolerated well Level of Consciousness (Post- Awake and Alert procedure): Post Debridement Measurements of Total Wound Length: (cm) 2.1 Width: (cm) 0.7 Depth: (cm) 0.2 Volume: (cm) 0.231 Character of Wound/Ulcer Post Debridement: Improved Severity of Tissue Post Debridement: Fat layer exposed Post Procedure Diagnosis Same as Pre-procedure Electronic Signature(s) Signed: 01/16/2022 3:43:46 PM By: Linton Ham MD Signed: 01/16/2022 4:57:53 PM By: Rhae Hammock RN Entered By: Linton Ham on 01/16/2022 11:17:54 -------------------------------------------------------------------------------- Debridement Details Patient Name: Date of Service: Jose Robinson, Jose Robinson. 01/16/2022 10:15 A M Medical Record Number: [id number] Patient Account Number:  [id number] Date of Birth/Sex: Treating RN: 03-12-1956 (66 y.o. [name], [name] Primary Care Provider: PCP, NO Other Clinician: Referring Provider: Treating Provider/Extender: Cheree Ditto in Treatment: 5 Debridement Performed for Assessment: Wound #2 Right Knee Performed By: Physician Ricard Dillon., MD Debridement Type: Debridement Severity of Tissue Pre Debridement: Fat layer exposed Level of Consciousness (Pre-procedure): Awake and Alert Pre-procedure Verification/Time Out Yes - 10:55 Taken: Start Time: 10:56 Pain Control: Other : benzocaine 20% spray T Area Debrided (L x W): otal 0.7 (cm) x 1 (cm) = 0.7 (cm) Tissue and other material debrided: Viable, Non-Viable, Slough, Subcutaneous, Slough Level: Skin/Subcutaneous Tissue Debridement Description: Excisional Instrument: Curette Bleeding: Minimum Hemostasis Achieved: Pressure Procedural Pain: 5 Post Procedural Pain: 3 Response to Treatment: Procedure was tolerated well Level of Consciousness (Post- Awake and Alert procedure): Post Debridement Measurements of Total Wound Length: (cm) 0.7 Width: (cm) 1 Depth: (cm) 0.1 Volume: (cm) 0.055 Character of Wound/Ulcer Post Debridement: Improved Severity of Tissue Post Debridement: Fat layer exposed Post Procedure Diagnosis Same as Pre-procedure Electronic Signature(s) Signed: 01/16/2022 3:43:46 PM By: Linton Ham MD Signed: 01/16/2022 4:57:53 PM By: Rhae Hammock RN Entered By: Linton Ham on 01/16/2022 11:18:10 -------------------------------------------------------------------------------- Debridement Details Patient Name: Date of Service: Jose Robinson, Jose Robinson. 01/16/2022 10:15 A M Medical Record Number: [id number] Patient Account Number: [id number] Date of Birth/Sex: Treating RN: 1956/01/28 (66 y.o. Erie Noe Primary Care Provider: PCP, NO Other Clinician: Referring Provider: Treating Provider/Extender: Cheree Ditto in  Treatment: 5 Debridement Performed for Assessment: Wound #3 Right,Distal Knee Performed By: Physician Ricard Dillon., MD Debridement Type: Debridement Severity of Tissue Pre Debridement: Fat layer exposed Level of Consciousness (Pre-procedure): Awake and Alert Pre-procedure Verification/Time Out Yes - 10:55 Taken: Start Time: 10:56 Pain Control: Other : benzocaine 20% spray T Area Debrided (L x W): otal 2 (cm) x 2 (cm) = 4 (cm) Tissue and other material debrided: Viable, Non-Viable, Slough, Subcutaneous, Slough Level: Skin/Subcutaneous Tissue Debridement Description: Excisional Instrument: Curette Bleeding: Minimum Hemostasis  Achieved: Pressure Procedural Pain: 5 Post Procedural Pain: 3 Response to Treatment: Procedure was tolerated well Level of Consciousness (Post- Awake and Alert procedure): Post Debridement Measurements of Total Wound Length: (cm) 2 Width: (cm) 2 Depth: (cm) 1 Volume: (cm) 3.142 Character of Wound/Ulcer Post Debridement: Improved Severity of Tissue Post Debridement: Fat layer exposed Post Procedure Diagnosis Same as Pre-procedure Electronic Signature(s) Signed: 01/16/2022 3:43:46 PM By: Linton Ham MD Signed: 01/16/2022 4:57:53 PM By: Rhae Hammock RN Entered By: Linton Ham on 01/16/2022 11:18:21 -------------------------------------------------------------------------------- Debridement Details Patient Name: Date of Service: Jose Robinson, Jose Robinson. 01/16/2022 10:15 A M Medical Record Number: [id number] Patient Account Number: [id number] Date of Birth/Sex: Treating RN: 1956/11/29 (66 y.o. [name], [name] Primary Care Provider: PCP, NO Other Clinician: Referring Provider: Treating Provider/Extender: Cheree Ditto in Treatment: 5 Debridement Performed for Assessment: Wound #4 Left,Distal Knee Performed By: Physician Ricard Dillon., MD Debridement Type: Debridement Level of Consciousness (Pre-procedure): Awake and  Alert Pre-procedure Verification/Time Out Yes - 10:55 Taken: Start Time: 10:56 Pain Control: Other : benzocaine 20% spray T Area Debrided (L x W): otal 0.6 (cm) x 0.7 (cm) = 0.42 (cm) Tissue and other material debrided: Viable, Non-Viable, Slough, Subcutaneous, Slough Level: Skin/Subcutaneous Tissue Debridement Description: Excisional Instrument: Curette Bleeding: Minimum Hemostasis Achieved: Pressure Procedural Pain: 5 Post Procedural Pain: 3 Response to Treatment: Procedure was tolerated well Level of Consciousness (Post- Awake and Alert procedure): Post Debridement Measurements of Total Wound Length: (cm) 0.6 Width: (cm) 0.7 Depth: (cm) 0.1 Volume: (cm) 0.033 Character of Wound/Ulcer Post Debridement: Improved Post Procedure Diagnosis Same as Pre-procedure Electronic Signature(s) Signed: 01/16/2022 3:43:46 PM By: Linton Ham MD Signed: 01/16/2022 4:57:53 PM By: Rhae Hammock RN Entered By: Linton Ham on 01/16/2022 11:18:29 -------------------------------------------------------------------------------- HPI Details Patient Name: Date of Service: Jose Robinson, [name]. 01/16/2022 10:15 A M Medical Record Number: [id number] Patient Account Number: [id number] Date of Birth/Sex: Treating RN: 06-13-1956 (66 y.o. Erie Noe Primary Care Provider: PCP, NO Other Clinician: Referring Provider: Treating Provider/Extender: Cheree Ditto in Treatment: 5 History of Present Illness HPI Description: ADMISSION 12/12/2021 This is a 66 year old man who is a type II diabetic likely with multiple complications. He is self-referred for wounds on his right patella and right posterior patellar tendon also on the left patella. 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 He is a type II diabetic. The patient states the wounds were there for the last 3 to 4 weeks a simply broke open. He complains of pain in the right leg. He was in the ER on  October 26 x-rays of the left knee were done that did not show any abnormalities. He was diagnosed with prepatellar bursitis. 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 fib. He was at a wound care center in [facility] I believe in 2016 with a wound on his left leg. Notable that he had a right BKA at [facility]. Presumably he had arterial studies but I could not put my eyes on these today ABI in the left 12/20; comes in today with a new wound on the lower left patella. It seems according the patient there was confusion about who is to apply the [facility]  even though the orders from our point of view were fairly clear. He is in a very light care assisted living We do not have a booking for his arterial studies which will include ABI TBI and arterial Dopplers on the left leg and on the BKA side on the right simply arterial Dopplers 1/18; the patient is using Santyl and gauze and doing this himself in the assisted living. Initially had some concern about this however he appears to be doing a good job in the surface of the wounds on his bilateral knee areas actually looks better. He has his arterial studies on the left leg on 1/20. He does not have a wound in the foot or leg Partway through our visit he did talk about a wound on his buttocks although we did not look at this today unfortunately Electronic Signature(s) Signed: 01/16/2022 3:43:46 PM By: Linton Ham MD Entered By: Linton Ham on 01/16/2022 11:21:23 -------------------------------------------------------------------------------- Physical Exam Details Patient Name: Date of Service: Jose Robinson. 01/16/2022 10:15 A M Medical Record Number:  [id number] Patient Account Number: [id number] Date of Birth/Sex: Treating RN: [birth year] (66 y.o. Erie Noe Primary Care Provider: PCP, NO Other Clinician: Referring Provider: Treating Provider/Extender: Cheree Ditto in Treatment: 5 Constitutional Sitting or standing Blood Pressure is within target range for patient.. Pulse regular and within target range for patient.Marland Kitchen Respirations regular, non-labored and within target range.. Temperature is normal and within the target range for the patient.Marland Kitchen Appears in no distress. Cardiovascular He has popliteal pulses bilaterally. We did not look at his left foot today. But his leg is cold. Pedal pulses absent bilaterally.. Notes Wound exam; superficial wounds on the right infrapatellar tendon right patella on the left he has wounds on the patella suprapatellar tendon and the infrapatellar tendon. I used a #5 curette to lightly debride all of these. The Santyl is helped making this possible. Most of these clean up quite nicely and they actually looks somewhat better Electronic Signature(s) Signed: 01/16/2022 3:43:46 PM By: Linton Ham MD Entered By: Linton Ham on 01/16/2022 11:23:00 -------------------------------------------------------------------------------- Physician Orders Details Patient Name: Date of Service: Jose Robinson, Jose Robinson. 01/16/2022 10:15 A M Medical Record Number: [id number] Patient Account Number: [id number] Date of Birth/Sex: Treating RN: 1956/03/14 (66 y.o. Ernestene Mention Primary Care Provider: PCP, NO Other Clinician: Referring Provider: Treating Provider/Extender: Cheree Ditto in Treatment: 5 Verbal / Phone Orders: No Diagnosis Coding ICD-10 Coding Code Description E11.51 Type 2 diabetes mellitus with diabetic peripheral angiopathy without gangrene L97.818 Non-pressure chronic ulcer of other part of right lower leg with other specified severity L97.828 Non-pressure chronic ulcer of  other part of left lower leg with other specified severity Follow-up Appointments ppointment in 2 weeks. - Dr. Dellia Nims!!! Return A Other: - Resident to apply dressings. Pt. has been educated on how to dress wounds Bathing/ Shower/ Hygiene May shower with protection but do not get wound dressing(s) wet. Edema Control - Lymphedema / SCD / Other Elevate legs to the level of the heart or above for 30 minutes daily and/or when sitting, a frequency of: Avoid standing for long periods of time. Wound Treatment Wound #1 - Knee Wound Laterality: Left Cleanser: Soap and Water 1 x Per YQM/57 Days Discharge Instructions: May shower and wash wound with dial antibacterial soap and water prior to dressing change. Cleanser: Wound Cleanser (Generic) 1 x Per Day/15 Days Discharge Instructions: Cleanse the wound with wound cleanser prior to applying a clean dressing using gauze sponges, not  tissue or cotton balls. Prim Dressing: Santyl Ointment 1 x Per Day/15 Days ary Discharge Instructions: Apply nickel thick amount to wound bed as instructed Secondary Dressing: Woven Gauze Sponge, Non-Sterile 4x4 in (Generic) 1 x Per Day/15 Days Discharge Instructions: Apply over primary dressing as directed. Secondary Dressing: Bordered Gauze, 4x4 in (DME) (Generic) 1 x Per Day/15 Days Discharge Instructions: Apply over primary dressing as directed. Wound #2 - Knee Wound Laterality: Right Cleanser: Soap and Water 1 x Per HWE/99 Days Discharge Instructions: May shower and wash wound with dial antibacterial soap and water prior to dressing change. Cleanser: Wound Cleanser (Generic) 1 x Per Day/15 Days Discharge Instructions: Cleanse the wound with wound cleanser prior to applying a clean dressing using gauze sponges, not tissue or cotton balls. Prim Dressing: Santyl Ointment 1 x Per Day/15 Days ary Discharge Instructions: Apply nickel thick amount to wound bed as instructed Secondary Dressing: Woven Gauze Sponge,  Non-Sterile 4x4 in (Generic) 1 x Per Day/15 Days Discharge Instructions: Apply over primary dressing as directed. Secondary Dressing: Bordered Gauze, 4x4 in (DME) (Generic) 1 x Per Day/15 Days Discharge Instructions: Apply over primary dressing as directed. Wound #3 - Knee Wound Laterality: Right, Distal Cleanser: Soap and Water 1 x Per BZJ/69 Days Discharge Instructions: May shower and wash wound with dial antibacterial soap and water prior to dressing change. Cleanser: Wound Cleanser (Generic) 1 x Per Day/15 Days Discharge Instructions: Cleanse the wound with wound cleanser prior to applying a clean dressing using gauze sponges, not tissue or cotton balls. Prim Dressing: Santyl Ointment 1 x Per Day/15 Days ary Discharge Instructions: Apply nickel thick amount to wound bed as instructed Secondary Dressing: Woven Gauze Sponge, Non-Sterile 4x4 in (Generic) 1 x Per Day/15 Days Discharge Instructions: Apply over primary dressing as directed. Secondary Dressing: Bordered Gauze, 4x4 in (DME) (Generic) 1 x Per Day/15 Days Discharge Instructions: Apply over primary dressing as directed. Wound #4 - Knee Wound Laterality: Left, Distal Cleanser: Soap and Water 1 x Per CVE/93 Days Discharge Instructions: May shower and wash wound with dial antibacterial soap and water prior to dressing change. Cleanser: Wound Cleanser (Generic) 1 x Per Day/15 Days Discharge Instructions: Cleanse the wound with wound cleanser prior to applying a clean dressing using gauze sponges, not tissue or cotton balls. Prim Dressing: Santyl Ointment 1 x Per Day/15 Days ary Discharge Instructions: Apply nickel thick amount to wound bed as instructed Secondary Dressing: Woven Gauze Sponge, Non-Sterile 4x4 in (Generic) 1 x Per Day/15 Days Discharge Instructions: Apply over primary dressing as directed. Secondary Dressing: Bordered Gauze, 4x4 in (DME) (Generic) 1 x Per Day/15 Days Discharge Instructions: Apply over primary dressing  as directed. Patient Medications llergies: Iodinated Contrast Media A Notifications Medication Indication Start End 01/16/2022 Santyl DOSE topical 250 unit/gram ointment - ointment topical apply thin layer to wounds daily prior to debridement 01/16/2022 benzocaine DOSE topical 20 % aerosol - aerosol topical Electronic Signature(s) Signed: 01/16/2022 3:43:46 PM By: Linton Ham MD Signed: 01/16/2022 4:46:25 PM By: Baruch Gouty RN, BSN Entered By: Baruch Gouty on 01/16/2022 11:06:28 Prescription 01/16/2022 -------------------------------------------------------------------------------- Berenda Morale MD Patient Name: Provider: 01-27-1956 [id number] Date of Birth: NPI#: Jerilynn Mages [id number] Sex: DEA #: [phone] [id number] Phone #: License #: [place] Patient Address: [place], [place] [address] [address], [place] [address] [phone] Allergies Iodinated Contrast Media Medication Medication: Route: Strength: Form: Santyl topical 250 unit/gram ointment Class: TOPICAL/MUCOUS MEMBR./SUBCUT ENZYMES . Dose: Frequency /  Time: Indication: ointment topical apply thin layer to wounds daily Number of Refills: Number of Units: 1 Thirty (30) Gram(s) Generic Substitution: Start Date: End Date: Administered at Facility: Substitution Permitted 1/61/0960 No Note to Pharmacy: Hand Signature: Date(s): Electronic Signature(s) Signed: 01/16/2022 3:43:46 PM By: Linton Ham MD Signed: 01/16/2022 4:46:25 PM By: Baruch Gouty RN, BSN Entered By: Baruch Gouty on 01/16/2022 11:06:28 -------------------------------------------------------------------------------- Problem List Details Patient Name: Date of Service: Jose Robinson, Jose Robinson. 01/16/2022 10:15 A M Medical Record Number: [id number] Patient Account Number: [id number] Date of Birth/Sex: Treating RN: 1956-10-10 (66 y.o. Ernestene Mention Primary Care Provider: PCP, NO Other Clinician: Referring Provider: Treating Provider/Extender: Cheree Ditto in Treatment: 5 Active Problems ICD-10 Encounter Code Description Active Date MDM Diagnosis E11.51 Type 2 diabetes mellitus with diabetic peripheral angiopathy without gangrene 12/12/2021 No Yes L97.818 Non-pressure chronic ulcer of other part of right lower leg with other specified 12/12/2021 No Yes severity L97.828 Non-pressure chronic ulcer of other part of left lower leg with other specified 12/12/2021 No Yes severity Inactive Problems Resolved Problems Electronic Signature(s) Signed: 01/16/2022 3:43:46 PM By: Linton Ham MD Entered By: Linton Ham on 01/16/2022 11:17:26 -------------------------------------------------------------------------------- Progress Note Details Patient Name: Date of Service: Jose Robinson, Jose Robinson. 01/16/2022 10:15 A M Medical Record Number: [id number] Patient Account Number: [id number] Date of Birth/Sex: Treating RN: April 16, 1956 (66 y.o. Erie Noe Primary Care Provider: PCP, NO Other Clinician: Referring Provider: Treating Provider/Extender: Cheree Ditto in Treatment: 5 Subjective History of Present Illness (HPI) ADMISSION 12/12/2021 This is a 66 year old man who is a type II diabetic likely with multiple complications. He is self-referred for wounds on his right patella and right posterior patellar tendon also on the left patella. 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 He is a type II diabetic. The patient states the wounds were there for the last 3 to 4 weeks a simply broke open. He complains of pain in the right leg. He was in the ER on October 26 x-rays of the left knee were done that did not show any abnormalities. He was diagnosed with prepatellar bursitis. 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 fib. He was at a wound care center in [facility] I believe in 2016 with a wound on his left leg. Notable that he had a right BKA at [facility]. Presumably he had arterial studies but I could not put my eyes on these today ABI in the left 12/20; comes in today with a new wound on the lower left patella. It seems according the patient there was confusion about who is to apply the Santyl even though the orders from our point of view were fairly clear. He is in a very light care assisted living We do not have a booking for his arterial studies which will include ABI TBI and arterial Dopplers on the left leg and on the BKA side on the right simply arterial Dopplers 1/18; the patient is using Santyl and gauze and doing this himself in the assisted living. Initially had some concern about this however he appears to be doing a good job in the surface of the wounds on his bilateral knee areas  actually looks better. He has his arterial studies on the left leg on 1/20. He does not have a wound in the foot or leg Partway through our visit he did talk about a wound on his buttocks although we did not look at this today unfortunately Objective Constitutional Sitting or standing Blood Pressure is within target range for patient.. Pulse regular and within target range for patient.Marland Kitchen Respirations regular, non-labored and within target range.. Temperature is normal and within the target range for the patient.Marland Kitchen Appears in no distress. Vitals Time Taken: 10:32 AM, Height: 71 in, Source: Stated, Weight: 144 lbs, Source: Stated, BMI: 20.1, Temperature: 98.7 F, Pulse: 97 bpm, Respiratory Rate: 18 breaths/min, Blood Pressure: 109/70 mmHg, Capillary Blood Glucose: 118 mg/dl. General Notes: glucose per pt  report this am Cardiovascular He has popliteal pulses bilaterally. We did not look at his left foot today. But his leg is cold. Pedal pulses absent bilaterally.. General Notes: Wound exam; superficial wounds on the right infrapatellar tendon right patella on the left he has wounds on the patella suprapatellar tendon and the infrapatellar tendon. I used a #5 curette to lightly debride all of these. The Santyl is helped making this possible. Most of these clean up quite nicely and they actually looks somewhat better Integumentary (Hair, Skin) Wound #1 status is Open. Original cause of wound was Gradually Appeared. The date acquired was: 11/12/2021. The wound has been in treatment 5 weeks. The wound is located on the Left Knee. The wound measures 2.1cm length x 0.7cm width x 0.2cm depth; 1.155cm^2 area and 0.231cm^3 volume. There is Fat Layer (Subcutaneous Tissue) exposed. There is no tunneling or undermining noted. There is a small amount of serosanguineous drainage noted. The wound margin is distinct with the outline attached to the wound base. There is large (67-100%) pink, pale granulation within the wound bed. There is a small (1-33%) amount of necrotic tissue within the wound bed including Adherent Slough. Wound #2 status is Open. Original cause of wound was Gradually Appeared. The date acquired was: 11/12/2021. The wound has been in treatment 5 weeks. The wound is located on the Right Knee. The wound measures 0.7cm length x 1cm width x 0.1cm depth; 0.55cm^2 area and 0.055cm^3 volume. There is Fat Layer (Subcutaneous Tissue) exposed. There is no tunneling or undermining noted. There is a medium amount of serosanguineous drainage noted. The wound margin is distinct with the outline attached to the wound base. There is no granulation within the wound bed. There is a large (67-100%) amount of necrotic tissue within the wound bed including Adherent Slough. Wound #3 status is Open. Original cause of  wound was Gradually Appeared. The date acquired was: 11/12/2021. The wound has been in treatment 5 weeks. The wound is located on the Right,Distal Knee. The wound measures 2cm length x 2cm width x 0.1cm depth; 3.142cm^2 area and 0.314cm^3 volume. There is Fat Layer (Subcutaneous Tissue) exposed. There is no tunneling or undermining noted. There is a medium amount of serosanguineous drainage noted. The wound margin is distinct with the outline attached to the wound base. There is medium (34-66%) red, pink granulation within the wound bed. There is a medium (34-66%) amount of necrotic tissue within the wound bed including Adherent Slough. Wound #4 status is Open. Original cause of wound was Not Known. The date acquired was: 12/18/2021. The wound has been in treatment 4 weeks. The wound is located on the Left,Distal Knee. The wound measures 0.6cm length x 0.7cm width x 0.1cm depth; 0.33cm^2  area and 0.033cm^3 volume. There is Fat Layer (Subcutaneous Tissue) exposed. There is no tunneling or undermining noted. There is a none present amount of drainage noted. The wound margin is flat and intact. There is no granulation within the wound bed. There is no necrotic tissue within the wound bed. 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 There was a Excisional Skin/Subcutaneous Tissue Debridement with a total area of 1.05 sq cm performed by Ricard Dillon., MD. With the following instrument(s): Curette to remove Viable and Non-Viable tissue/material.  Material removed includes Subcutaneous Tissue and Slough and after achieving pain control using Other (benzocaine 20% spray). No specimens were taken. A time out was conducted at 10:55, prior to the start of the procedure. A Minimum amount of bleeding was controlled with Pressure. The procedure was tolerated well with a pain level of 5 throughout and a pain level of 3 following the procedure. Post Debridement Measurements: 2.1cm length x 0.7cm width x 0.2cm depth; 0.231cm^3 volume. Character of Wound/Ulcer Post Debridement is improved. Severity of Tissue Post Debridement is: Fat layer exposed. Post procedure Diagnosis Wound #1: Same as Pre-Procedure Wound #2 Pre-procedure diagnosis of Wound #2 is a Diabetic Wound/Ulcer of the Lower Extremity located on the Right Knee .Severity of Tissue Pre Debridement is: Fat layer exposed. There was a Excisional Skin/Subcutaneous Tissue Debridement with a total area of 0.7 sq cm performed by Ricard Dillon., MD. With the following instrument(s): Curette to remove Viable and Non-Viable tissue/material. Material removed includes Subcutaneous Tissue and Slough and after achieving pain control using Other (benzocaine 20% spray). No specimens were taken. A time out was conducted at 10:55, prior to the start of the procedure. A Minimum amount of bleeding was controlled with Pressure. The procedure was tolerated well with a pain level of 5 throughout and a pain level of 3 following the procedure. Post Debridement Measurements: 0.7cm length x 1cm width x 0.1cm depth; 0.055cm^3 volume. Character of Wound/Ulcer Post Debridement is improved. Severity of Tissue Post Debridement is: Fat layer exposed. Post procedure Diagnosis Wound #2: Same as Pre-Procedure Wound #3 Pre-procedure diagnosis of Wound #3 is a Diabetic Wound/Ulcer of the Lower Extremity located on the Right,Distal Knee .Severity of Tissue Pre Debridement is: Fat layer exposed. There was a Excisional  Skin/Subcutaneous Tissue Debridement with a total area of 4 sq cm performed by Ricard Dillon., MD. With the following instrument(s): Curette to remove Viable and Non-Viable tissue/material. Material removed includes Subcutaneous Tissue and Slough and after achieving pain control using Other (benzocaine 20% spray). No specimens were taken. A time out was conducted at 10:55, prior to the start of the procedure. A Minimum amount of bleeding was controlled with Pressure. The procedure was tolerated well with a pain level of 5 throughout and a pain level of 3 following the procedure. Post Debridement Measurements: 2cm length x 2cm width x 1cm depth; 3.142cm^3 volume. Character of Wound/Ulcer Post Debridement is improved. Severity of Tissue Post Debridement is: Fat layer exposed. Post procedure Diagnosis Wound #3: Same as Pre-Procedure Wound #4 Pre-procedure diagnosis of Wound #4 is a T be determined  located on the Left,Distal Knee . There was a Excisional Skin/Subcutaneous Tissue Debridement o with a total area of 0.42 sq cm performed by Ricard Dillon., MD. With the following instrument(s): Curette to remove Viable and Non-Viable tissue/material. Material removed includes Subcutaneous Tissue and Slough and after achieving pain control using Other (benzocaine 20% spray). No specimens were taken. A time out was conducted at 10:55, prior to the start of the procedure. A Minimum amount of bleeding was controlled with Pressure. The procedure was tolerated well with a pain level of 5 throughout and a pain level of 3 following the procedure. Post Debridement Measurements: 0.6cm length x 0.7cm width x 0.1cm depth; 0.033cm^3 volume. Character of Wound/Ulcer Post Debridement is improved. Post procedure Diagnosis Wound #4: Same as Pre-Procedure Plan Follow-up Appointments: Return Appointment in 2 weeks. - Dr. Dellia Nims!!! Other: - Resident to apply dressings. Pt. has been educated on how to dress  wounds Bathing/ Shower/ Hygiene: May shower with protection but do not get wound dressing(s) wet. Edema Control - Lymphedema / SCD / Other: Elevate legs to the level of the heart or above for 30 minutes daily and/or when sitting, a frequency of: Avoid standing for long periods of time. The following medication(s) was prescribed: Santyl topical 250 unit/gram ointment ointment topical apply thin layer to wounds daily starting 01/16/2022 benzocaine topical 20 % aerosol aerosol topical for prior to debridement was prescribed at facility WOUND #1: - Knee Wound Laterality: Left Cleanser: Soap and Water 1 x Per Day/15 Days Discharge Instructions: May shower and wash wound with dial antibacterial soap and water prior to dressing change. Cleanser: Wound Cleanser (Generic) 1 x Per Day/15 Days Discharge Instructions: Cleanse the wound with wound cleanser prior to applying a clean dressing using gauze sponges, not tissue or cotton balls. Prim Dressing: Santyl Ointment 1 x Per Day/15 Days ary Discharge Instructions: Apply nickel thick amount to wound bed as instructed Secondary Dressing: Woven Gauze Sponge, Non-Sterile 4x4 in (Generic) 1 x Per Day/15 Days Discharge Instructions: Apply over primary dressing as directed. Secondary Dressing: Bordered Gauze, 4x4 in (DME) (Generic) 1 x Per Day/15 Days Discharge Instructions: Apply over primary dressing as directed. WOUND #2: - Knee Wound Laterality: Right Cleanser: Soap and Water 1 x Per BTD/17 Days Discharge Instructions: May shower and wash wound with dial antibacterial soap and water prior to dressing change. Cleanser: Wound Cleanser (Generic) 1 x Per Day/15 Days Discharge Instructions: Cleanse the wound with wound cleanser prior to applying a clean dressing using gauze sponges, not tissue or cotton balls. Prim Dressing: Santyl Ointment 1 x Per Day/15 Days ary Discharge Instructions: Apply nickel thick amount to wound bed as instructed Secondary  Dressing: Woven Gauze Sponge, Non-Sterile 4x4 in (Generic) 1 x Per Day/15 Days Discharge Instructions: Apply over primary dressing as directed. Secondary Dressing: Bordered Gauze, 4x4 in (DME) (Generic) 1 x Per Day/15 Days Discharge Instructions: Apply over primary dressing as directed. WOUND #3: - Knee Wound Laterality: Right, Distal Cleanser: Soap and Water 1 x Per [facility]/07 Days Discharge Instructions: May shower and wash wound with dial antibacterial soap and water prior to dressing change. Cleanser: Wound Cleanser (Generic) 1 x Per Day/15 Days Discharge Instructions: Cleanse the wound with wound cleanser prior to applying a clean dressing using gauze sponges, not tissue or cotton balls. Prim Dressing: Santyl Ointment 1 x Per Day/15 Days ary Discharge Instructions: Apply nickel thick amount to wound bed as instructed Secondary Dressing: Woven Gauze Sponge, Non-Sterile 4x4 in (Generic) 1 x Per Day/15 Days  Discharge Instructions: Apply over primary dressing as directed. Secondary Dressing: Bordered Gauze, 4x4 in (DME) (Generic) 1 x Per Day/15 Days Discharge Instructions: Apply over primary dressing as directed. WOUND #4: - Knee Wound Laterality: Left, Distal Cleanser: Soap and Water 1 x Per SFK/81 Days Discharge Instructions: May shower and wash wound with dial antibacterial soap and water prior to dressing change. Cleanser: Wound Cleanser (Generic) 1 x Per Day/15 Days Discharge Instructions: Cleanse the wound with wound cleanser prior to applying a clean dressing using gauze sponges, not tissue or cotton balls. Prim Dressing: Santyl Ointment 1 x Per Day/15 Days ary Discharge Instructions: Apply nickel thick amount to wound bed as instructed Secondary Dressing: Woven Gauze Sponge, Non-Sterile 4x4 in (Generic) 1 x Per Day/15 Days Discharge Instructions: Apply over primary dressing as directed. Secondary Dressing: Bordered Gauze, 4x4 in (DME) (Generic) 1 x Per Day/15 Days Discharge  Instructions: Apply over primary dressing as directed. 1. I continued with the Santyl that I represcribed. The patient is doing a good job of applying this himself 2. I was able to do a surface debridement on most of his wounds and the resultant wounds actually look quite a bit better 3. 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 He did tell us about a buttock wound but between myself and the nurse we did not get that this today. If this is a problem or going to have to make him I appointment or see him Electronic Signature(s) Signed: 01/16/2022 3:43:46 PM By: Linton Ham MD Entered By: Linton Ham on 01/16/2022 11:24:12 -------------------------------------------------------------------------------- SuperBill Details Patient Name: Date of Service: Jose Robinson, Jose Robinson 01/16/2022 Medical Record Number: [id number] Patient Account Number: [id number] Date of Birth/Sex: Treating RN: October 11, 1956 (66 y.o. [name], [name] Primary Care Provider: PCP, NO Other Clinician: Referring Provider: Treating Provider/Extender: Cheree Ditto in Treatment: 5 Diagnosis Coding ICD-10 Codes Code Description E11.51 Type 2 diabetes mellitus with diabetic peripheral angiopathy without gangrene L97.818 Non-pressure chronic ulcer of other part of right lower leg with other specified severity L97.828 Non-pressure chronic ulcer of other part of left lower leg with other specified severity Facility Procedures CPT4 Code: [id number] Description: 91638 - DEB SUBQ TISSUE 20 SQ CM/< ICD-10 Diagnosis Description L97.818 Non-pressure chronic ulcer of other part of right lower leg with other specified L97.828 Non-pressure chronic ulcer of other part of left lower leg with other specified Modifier: severity severity Quantity: 1 Physician Procedures : CPT4 Code Description Modifier  [id number] - WC PHYS SUBQ TISS 20 SQ CM ICD-10 Diagnosis Description L97.818 Non-pressure chronic ulcer of other part of right lower leg with other specified severity L97.828 Non-pressure chronic ulcer of other part of  left lower leg with other specified severity Quantity: 1 Electronic Signature(s) Signed: 01/16/2022 3:43:46 PM By: Linton Ham MD Signed: 01/16/2022 4:46:25 PM By: Baruch Gouty RN, BSN Entered By: Baruch Gouty on 01/16/2022 11:28:33

## 2022-01-16 NOTE — Progress Notes (Signed)
CLIFTON, SAFLEY ([id number]) Visit Report for 01/16/2022 Arrival Information Details Patient Name: Date of Service: Jose Robinson 01/16/2022 10:15 A M Medical Record Number: [id number] Patient Account Number: [id number] Date of Birth/Sex: Treating RN: May 10, 1956 (66 y.o. Jose Robinson Primary Care Marua Qin: PCP, NO Other Clinician: Referring Naylah Cork: Treating Purcell Jungbluth/Extender: Cheree Ditto in Treatment: 5 Visit Information History Since Last Visit Added or deleted any medications: No Patient Arrived: Wheel Chair Any new allergies or adverse reactions: No Arrival Time: 10:26 Had a fall or experienced change in No Accompanied By: self activities of daily living that may affect Transfer Assistance: Manual risk of falls: Patient Identification Verified: Yes Signs or symptoms of abuse/neglect since last visito No Secondary Verification Process Completed: Yes Hospitalized since last visit: No Patient Requires Transmission-Based Precautions: No Implantable device outside of the clinic excluding No Patient Has Alerts: No cellular tissue based products placed in the center since last visit: Has Dressing in Place as Prescribed: Yes Pain Present Now: Yes Electronic Signature(s) Signed: 01/16/2022 4:46:25 PM By: Baruch Gouty RN, BSN Entered By: Baruch Gouty on 01/16/2022 10:32:33 -------------------------------------------------------------------------------- Encounter Discharge Information Details Patient Name: Date of Service: Jose Robinson, Jose Robinson. 01/16/2022 10:15 A M Medical Record Number: [id number] Patient Account Number: [id number] Date of Birth/Sex: Treating RN: 1956-09-13 (66 y.o. Jose Robinson Primary Care Didi Ganaway: PCP, NO Other Clinician: Referring Doreatha Offer: Treating Lissandro Dilorenzo/Extender: Cheree Ditto in Treatment: 5 Encounter Discharge Information Items Post Procedure Vitals Discharge Condition: Stable Temperature (F): 98.7 Ambulatory  Status: Wheelchair Pulse (bpm): 97 Discharge Destination: Home Respiratory Rate (breaths/min): 18 Transportation: Other Blood Pressure (mmHg): 109/70 Accompanied By: self Schedule Follow-up Appointment: Yes Clinical Summary of Care: Patient Declined Notes facility transportation Electronic Signature(s) Signed: 01/16/2022 4:46:25 PM By: Baruch Gouty RN, BSN Entered By: Baruch Gouty on 01/16/2022 11:24:10 -------------------------------------------------------------------------------- Lower Extremity Assessment Details Patient Name: Date of Service: Jose Robinson, Jose Robinson 01/16/2022 10:15 A M Medical Record Number: [id number] Patient Account Number: [id number] Date of Birth/Sex: Treating RN: 28-Feb-1956 (66 y.o. Jose Robinson Primary Care Sevastian Witczak: PCP, NO Other Clinician: Referring Jose Robinson: Treating Daiel Strohecker/Extender: Cheree Ditto in Treatment: 5 Edema Assessment Assessed: [Left: No] [Right: No] Edema: [Left: Ye] [Right: s] Calf Left: Right: Point of Measurement: 35 cm From Medial Instep 33.8 cm Ankle Left: Right: Point of Measurement: 11 cm From Medial Instep 23 cm Electronic Signature(s) Signed: 01/16/2022 4:46:25 PM By: Baruch Gouty RN, BSN Entered By: Baruch Gouty on 01/16/2022 10:38:55 -------------------------------------------------------------------------------- Multi Wound Chart Details Patient Name: Date of Service: Jose Robinson, Jose Robinson. 01/16/2022 10:15 A M Medical Record Number: [id number] Patient Account Number: [id number] Date of Birth/Sex: Treating RN: 08/13/1956 (66 y.o. Jose Robinson, Jose Robinson Primary Care Naya Ilagan: PCP, NO Other Clinician: Referring Jamekia Gannett: Treating Cylus Douville/Extender: Cheree Ditto in Treatment: 5 Vital Signs Height(in): 71 Capillary Blood Glucose(mg/dl): 118 Weight(lbs): 144 Pulse(bpm): 97 Body Mass Index(BMI): 20 Blood Pressure(mmHg): 109/70 Temperature(F): 98.7 Respiratory Rate(breaths/min):  18 Photos: Left Knee Right Knee Right, Distal Knee Wound Location: Gradually Appeared Gradually Appeared Gradually Appeared Wounding Event: Diabetic Wound/Ulcer of the Lower Diabetic Wound/Ulcer of the Lower Diabetic Wound/Ulcer of the Lower Primary Etiology: Extremity Extremity Extremity Congestive Heart Failure, Congestive Heart Failure, Congestive Heart Failure, Comorbid History: Hypertension, Type II Jose Robinson, End Hypertension, Type II Jose Robinson, End Hypertension, Type II Jose Robinson, End Stage Renal Disease, Neuropathy Stage Renal Disease, Neuropathy Stage Renal Disease, Neuropathy 11/12/2021 11/12/2021 11/12/2021 Date Acquired: _0 Weeks of Treatment: Open Open Open Wound Status: Yes No No Clustered  Wound: 2 N/A N/A Clustered Quantity: 2.1x0.7x0.2 0.7x1x0.1 2x2x0.1 Measurements L x W x D (cm) 1.155 0.55 3.142 A (cm) : rea 0.231 0.055 0.314 Volume (cm) : 55.80% -42.90% 0.00% % Reduction in A rea: 11.80% 28.60% 50.00% % Reduction in Volume: Grade 2 Grade 2 Grade 2 Classification: Small Medium Medium Exudate A mount: Serosanguineous Serosanguineous Serosanguineous Exudate Type: red, brown red, brown red, brown Exudate Color: Distinct, outline attached Distinct, outline attached Distinct, outline attached Wound Margin: Large (67-100%) None Present (0%) Medium (34-66%) Granulation A mount: Pink, Pale N/A Red, Pink Granulation Quality: Small (1-33%) Large (67-100%) Medium (34-66%) Necrotic A mount: Fat Layer (Subcutaneous Tissue): Yes Fat Layer (Subcutaneous Tissue): Yes Fat Layer (Subcutaneous Tissue): Yes Exposed Structures: Fascia: No Fascia: No Fascia: No Tendon: No Tendon: No Tendon: No Muscle: No Muscle: No Muscle: No Joint: No Joint: No Joint: No Bone: No Bone: No Bone: No None Small (1-33%) Small (1-33%) Epithelialization: Debridement - Excisional Debridement - Excisional Debridement - Excisional Debridement: Pre-procedure  Verification/Time Out 10:55 10:55 10:55 Taken: Other Other Other Pain Control: Subcutaneous, Slough Subcutaneous, Slough Subcutaneous, Slough Tissue Debrided: Skin/Subcutaneous Tissue Skin/Subcutaneous Tissue Skin/Subcutaneous Tissue Level: 1.05 0.7 4 Debridement A (sq cm): rea Curette Curette Curette Instrument: Minimum Minimum Minimum Bleeding: Pressure Pressure Pressure Hemostasis A chieved: _0 Procedural Pain: _1 Post Procedural Pain: Procedure was tolerated well Procedure was tolerated well Procedure was tolerated well Debridement Treatment Response: 2.1x0.7x0.2 0.7x1x0.1 2x2x1 Post Debridement Measurements L x W x D (cm) 0.231 0.055 3.142 Post Debridement Volume: (cm) N/A N/A N/A Assessment Notes: Debridement Debridement Debridement Procedures Performed: Wound Number: 4 N/A N/A Photos: N/A N/A Left, Distal Knee N/A N/A Wound Location: Not Known N/A N/A Wounding Event: T be determined o N/A N/A Primary Etiology: Congestive Heart Failure, N/A N/A Comorbid History: Hypertension, Type II Jose Robinson, End Stage Renal Disease, Neuropathy 12/18/2021 N/A N/A Date Acquired: 4 N/A N/A Weeks of Treatment: Open N/A N/A Wound Status: No N/A N/A Clustered Wound: N/A N/A N/A Clustered Quantity: 0.6x0.7x0.1 N/A N/A Measurements L x W x D (cm) 0.33 N/A N/A A (cm) : rea 0.033 N/A N/A Volume (cm) : 0.00% N/A N/A % Reduction in Area: 0.00% N/A N/A % Reduction in Volume: Full Thickness Without Exposed N/A N/A Classification: Support Structures None Present N/A N/A Exudate Amount: N/A N/A N/A Exudate Type: N/A N/A N/A Exudate Color: Flat and Intact N/A N/A Wound Margin: None Present (0%) N/A N/A Granulation Amount: N/A N/A N/A Granulation Quality: None Present (0%) N/A N/A Necrotic Amount: Fat Layer (Subcutaneous Tissue): Yes N/A N/A Exposed Structures: Fascia: No Tendon: No Muscle: No Joint: No Bone: No Small (1-33%) N/A  N/A Epithelialization: Debridement - Excisional N/A N/A Debridement: Pre-procedure Verification/Time Out 10:55 N/A N/A Taken: Other N/A N/A Pain Control: Subcutaneous, Slough N/A N/A Tissue Debrided: Skin/Subcutaneous Tissue N/A N/A Level: 0.42 N/A N/A Debridement A (sq cm): rea Curette N/A N/A Instrument: Minimum N/A N/A Bleeding: Pressure N/A N/A Hemostasis A chieved: 5 N/A N/A Procedural Pain: 3 N/A N/A Post Procedural Pain: Procedure was tolerated well N/A N/A Debridement Treatment Response: 0.6x0.7x0.1 N/A N/A Post Debridement Measurements L x W x D (cm) 0.033 N/A N/A Post Debridement Volume: (cm) scabbed N/A N/A Assessment Notes: Debridement N/A N/A Procedures Performed: Treatment Notes Electronic Signature(s) Signed: 01/16/2022 3:43:46 PM By: Linton Ham MD Signed: 01/16/2022 4:57:53 PM By: Rhae Hammock RN Entered By: Linton Ham on 01/16/2022 11:17:43 -------------------------------------------------------------------------------- Multi-Disciplinary Care Plan Details Patient Name: Date of Service: Jose Robinson, DERMO T  O. 01/16/2022 10:15 A M Medical Record Number: [id number] Patient Account Number: [id number] Date of Birth/Sex: Treating RN: 1956/04/05 (66 y.o. Jose Robinson Primary Care Alsha Meland: PCP, NO Other Clinician: Referring Jodette Wik: Treating Yukari Flax/Extender: Cheree Ditto in Treatment: 5 Multidisciplinary Care Plan reviewed with physician Active Inactive Wound/Skin Impairment Nursing Diagnoses: Impaired tissue integrity Knowledge deficit related to ulceration/compromised skin integrity Goals: Patient will have a decrease in wound volume by X% from date: (specify in notes) Date Initiated: 12/12/2021 Date Inactivated: 01/16/2022 Target Resolution Date: 01/02/2022 Goal Status: Met Patient/caregiver will verbalize understanding of skin care regimen Date Initiated: 12/12/2021 Target Resolution Date: 02/06/2022 Goal  Status: Active Ulcer/skin breakdown will have a volume reduction of 30% by week 4 Date Initiated: 12/12/2021 Date Inactivated: 01/16/2022 Target Resolution Date: 01/03/2022 Unmet Reason: pressure continues with Goal Status: Unmet prosthesis Ulcer/skin breakdown will have a volume reduction of 50% by week 8 Date Initiated: 01/16/2022 Target Resolution Date: 02/06/2022 Goal Status: Active Interventions: Assess patient/caregiver ability to obtain necessary supplies Assess patient/caregiver ability to perform ulcer/skin care regimen upon admission and as needed Assess ulceration(s) every visit Notes: Electronic Signature(s) Signed: 01/16/2022 4:46:25 PM By: Baruch Gouty RN, BSN Entered By: Baruch Gouty on 01/16/2022 10:58:08 -------------------------------------------------------------------------------- Pain Assessment Details Patient Name: Date of Service: Jose Robinson, Jose Robinson. 01/16/2022 10:15 A M Medical Record Number: [id number] Patient Account Number: [id number] Date of Birth/Sex: Treating RN: 07/23/1956 (66 y.o. Jose Robinson Primary Care Antionetta Ator: PCP, NO Other Clinician: Referring Parnell Spieler: Treating Mare Ludtke/Extender: Cheree Ditto in Treatment: 5 Active Problems Location of Pain Severity and Description of Pain Patient Has Paino Yes Site Locations Pain Location: Pain in Ulcers With Dressing Change: Yes Duration of the Pain. Constant / Intermittento Intermittent Rate the pain. Current Pain Level: 7 Least Pain Level: 0 Character of Pain Describe the Pain: Aching Pain Management and Medication Current Pain Management: Medication: Yes Is the Current Pain Management Adequate: Adequate How does your wound impact your activities of daily livingo Sleep: Yes Bathing: No Appetite: No Relationship With Others: No Bladder Continence: No Emotions: Yes Bowel Continence: No Hobbies: No Toileting: No Dressing: No Electronic Signature(s) Signed: 01/16/2022  4:46:25 PM By: Baruch Gouty RN, BSN Entered By: Baruch Gouty on 01/16/2022 10:34:24 -------------------------------------------------------------------------------- Patient/Caregiver Education Details Patient Name: Date of Service: Jose Robinson 1/18/2023andnbsp10:15 [place] Record Number: [id number] Patient Account Number: [id number] Date of Birth/Gender: Treating RN: 26-Dec-1956 (66 y.o. Jose Robinson Primary Care Physician: PCP, NO Other Clinician: Referring Physician: Treating Physician/Extender: Cheree Ditto in Treatment: 5 Education Assessment Education Provided To: Patient Education Topics Provided Pressure: Methods: Explain/Verbal Responses: Reinforcements needed, State content correctly Wound/Skin Impairment: Methods: Explain/Verbal Responses: Reinforcements needed, State content correctly Electronic Signature(s) Signed: 01/16/2022 4:46:25 PM By: Baruch Gouty RN, BSN Entered By: Baruch Gouty on 01/16/2022 10:58:37 -------------------------------------------------------------------------------- Wound Assessment Details Patient Name: Date of Service: Jose Robinson, Jose Robinson. 01/16/2022 10:15 A M Medical Record Number: [id number] Patient Account Number: [id number] Date of Birth/Sex: Treating RN: 1956/10/29 (66 y.o. Jose Robinson Primary Care Tifini Reeder: PCP, NO Other Clinician: Referring Sheran Newstrom: Treating Latanja Lehenbauer/Extender: Cheree Ditto in Treatment: 5 Wound Status Wound Number: 1 Primary Diabetic Wound/Ulcer of the Lower Extremity Etiology: Wound Location: Left Knee Wound Open Wounding Event: Gradually Appeared Status: Date Acquired: 11/12/2021 Comorbid Congestive Heart Failure, Hypertension, Type II Jose Robinson, End Weeks Of Treatment: 5 History: Stage Renal Disease, Neuropathy Clustered Wound: Yes Photos Wound Measurements Length: (cm) 2.1 Width: (cm) 0.7 Depth: (cm) 0.2 Clustered Quantity: 2 Area: (cm)  1.155 Volume: (cm) 0.231 % Reduction in Area: 55.8% % Reduction in Volume: 11.8% Epithelialization: None Tunneling: No Undermining: No Wound Description Classification: Grade 2 Wound Margin: Distinct, outline attached Exudate Amount: Small Exudate Type: Serosanguineous Exudate Color: red, brown Foul Odor After Cleansing: No Slough/Fibrino Yes Wound Bed Granulation Amount: Large (67-100%) Exposed Structure Granulation Quality: Pink, Pale Fascia Exposed: No Necrotic Amount: Small (1-33%) Fat Layer (Subcutaneous Tissue) Exposed: Yes Necrotic Quality: Adherent Slough Tendon Exposed: No Muscle Exposed: No Joint Exposed: No Bone Exposed: No Treatment Notes Wound #1 (Knee) Wound Laterality: Left Cleanser Soap and Water Discharge Instruction: May shower and wash wound with dial antibacterial soap and water prior to dressing change. Wound Cleanser Discharge Instruction: Cleanse the wound with wound cleanser prior to applying a clean dressing using gauze sponges, not tissue or cotton balls. Peri-Wound Care Topical Primary Dressing Santyl Ointment Discharge Instruction: Apply nickel thick amount to wound bed as instructed Secondary Dressing Woven Gauze Sponge, Non-Sterile 4x4 in Discharge Instruction: Apply over primary dressing as directed. Bordered Gauze, 4x4 in Discharge Instruction: Apply over primary dressing as directed. Secured With Compression Wrap Compression Stockings Environmental education officer) Signed: 01/16/2022 4:46:25 PM By: Baruch Gouty RN, BSN Entered By: Baruch Gouty on 01/16/2022 10:46:19 -------------------------------------------------------------------------------- Wound Assessment Details Patient Name: Date of Service: Jose Robinson, Jose Robinson. 01/16/2022 10:15 A M Medical Record Number: [id number] Patient Account Number: [id number] Date of Birth/Sex: Treating RN: 1956/12/27 (66 y.o. Jose Robinson Primary Care Anju Sereno: PCP, NO Other  Clinician: Referring Mindie Rawdon: Treating Lummie Montijo/Extender: Cheree Ditto in Treatment: 5 Wound Status Wound Number: 2 Primary Diabetic Wound/Ulcer of the Lower Extremity Etiology: Wound Location: Right Knee Wound Open Wounding Event: Gradually Appeared Status: Date Acquired: 11/12/2021 Comorbid Congestive Heart Failure, Hypertension, Type II Jose Robinson, End Weeks Of Treatment: 5 History: Stage Renal Disease, Neuropathy Clustered Wound: No Photos Wound Measurements Length: (cm) 0.7 Width: (cm) 1 Depth: (cm) 0.1 Area: (cm) 0.55 Volume: (cm) 0.055 % Reduction in Area: -42.9% % Reduction in Volume: 28.6% Epithelialization: Small (1-33%) Tunneling: No Undermining: No Wound Description Classification: Grade 2 Wound Margin: Distinct, outline attached Exudate Amount: Medium Exudate Type: Serosanguineous Exudate Color: red, brown Foul Odor After Cleansing: No Slough/Fibrino Yes Wound Bed Granulation Amount: None Present (0%) Exposed Structure Necrotic Amount: Large (67-100%) Fascia Exposed: No Necrotic Quality: Adherent Slough Fat Layer (Subcutaneous Tissue) Exposed: Yes Tendon Exposed: No Muscle Exposed: No Joint Exposed: No Bone Exposed: No Treatment Notes Wound #2 (Knee) Wound Laterality: Right Cleanser Soap and Water Discharge Instruction: May shower and wash wound with dial antibacterial soap and water prior to dressing change. Wound Cleanser Discharge Instruction: Cleanse the wound with wound cleanser prior to applying a clean dressing using gauze sponges, not tissue or cotton balls. Peri-Wound Care Topical Primary Dressing Santyl Ointment Discharge Instruction: Apply nickel thick amount to wound bed as instructed Secondary Dressing Woven Gauze Sponge, Non-Sterile 4x4 in Discharge Instruction: Apply over primary dressing as directed. Bordered Gauze, 4x4 in Discharge Instruction: Apply over primary dressing as directed. Secured With Compression  Wrap Compression Stockings Environmental education officer) Signed: 01/16/2022 4:46:25 PM By: Baruch Gouty RN, BSN Entered By: Baruch Gouty on 01/16/2022 10:46:54 -------------------------------------------------------------------------------- Wound Assessment Details Patient Name: Date of Service: Jose Robinson, Jose Robinson. 01/16/2022 10:15 A M Medical Record Number: [id number] Patient Account Number: [id number] Date of Birth/Sex: Treating RN: 03-Aug-1956 (66 y.o. Jose Robinson Primary Care Lanice Folden: PCP, NO Other Clinician: Referring Oveda Dadamo: Treating Eldar Robitaille/Extender: Cheree Ditto in Treatment: 5 Wound Status Wound Number: 3 Primary  Diabetic Wound/Ulcer of the Lower Extremity Etiology: Wound Location: Right, Distal Knee Wound Open Wounding Event: Gradually Appeared Status: Date Acquired: 11/12/2021 Comorbid Congestive Heart Failure, Hypertension, Type II Jose Robinson, End Weeks Of Treatment: 5 History: Stage Renal Disease, Neuropathy Clustered Wound: No Photos Wound Measurements Length: (cm) 2 Width: (cm) 2 Depth: (cm) 0.1 Area: (cm) 3.142 Volume: (cm) 0.314 % Reduction in Area: 0% % Reduction in Volume: 50% Epithelialization: Small (1-33%) Tunneling: No Undermining: No Wound Description Classification: Grade 2 Wound Margin: Distinct, outline attached Exudate Amount: Medium Exudate Type: Serosanguineous Exudate Color: red, brown Foul Odor After Cleansing: No Slough/Fibrino Yes Wound Bed Granulation Amount: Medium (34-66%) Exposed Structure Granulation Quality: Red, Pink Fascia Exposed: No Necrotic Amount: Medium (34-66%) Fat Layer (Subcutaneous Tissue) Exposed: Yes Necrotic Quality: Adherent Slough Tendon Exposed: No Muscle Exposed: No Joint Exposed: No Bone Exposed: No Treatment Notes Wound #3 (Knee) Wound Laterality: Right, Distal Cleanser Soap and Water Discharge Instruction: May shower and wash wound with dial antibacterial soap and  water prior to dressing change. Wound Cleanser Discharge Instruction: Cleanse the wound with wound cleanser prior to applying a clean dressing using gauze sponges, not tissue or cotton balls. Peri-Wound Care Topical Primary Dressing Santyl Ointment Discharge Instruction: Apply nickel thick amount to wound bed as instructed Secondary Dressing Woven Gauze Sponge, Non-Sterile 4x4 in Discharge Instruction: Apply over primary dressing as directed. Bordered Gauze, 4x4 in Discharge Instruction: Apply over primary dressing as directed. Secured With Compression Wrap Compression Stockings Environmental education officer) Signed: 01/16/2022 4:46:25 PM By: Baruch Gouty RN, BSN Entered By: Baruch Gouty on 01/16/2022 10:47:35 -------------------------------------------------------------------------------- Wound Assessment Details Patient Name: Date of Service: Jose Robinson, Jose Robinson. 01/16/2022 10:15 A M Medical Record Number: [id number] Patient Account Number: [id number] Date of Birth/Sex: Treating RN: 01/25/1956 (66 y.o. Jose Robinson Primary Care Kemonte Ullman: PCP, NO Other Clinician: Referring Janeth Terry: Treating Jaedah Lords/Extender: Cheree Ditto in Treatment: 5 Wound Status Wound Number: 4 Primary T be determined o Etiology: Wound Location: Left, Distal Knee Wound Open Wounding Event: Not Known Status: Date Acquired: 12/18/2021 Comorbid Congestive Heart Failure, Hypertension, Type II Jose Robinson, End Weeks Of Treatment: 4 History: Stage Renal Disease, Neuropathy Clustered Wound: No Photos Wound Measurements Length: (cm) 0.6 Width: (cm) 0.7 Depth: (cm) 0.1 Area: (cm) 0.33 Volume: (cm) 0.033 % Reduction in Area: 0% % Reduction in Volume: 0% Epithelialization: Small (1-33%) Tunneling: No Undermining: No Wound Description Classification: Full Thickness Without Exposed Support Structures Wound Margin: Flat and Intact Exudate Amount: None Present Foul Odor After  Cleansing: No Slough/Fibrino Yes Wound Bed Granulation Amount: None Present (0%) Exposed Structure Necrotic Amount: None Present (0%) Fascia Exposed: No Fat Layer (Subcutaneous Tissue) Exposed: Yes Tendon Exposed: No Muscle Exposed: No Joint Exposed: No Bone Exposed: No Assessment Notes scabbed Treatment Notes Wound #4 (Knee) Wound Laterality: Left, Distal Cleanser Soap and Water Discharge Instruction: May shower and wash wound with dial antibacterial soap and water prior to dressing change. Wound Cleanser Discharge Instruction: Cleanse the wound with wound cleanser prior to applying a clean dressing using gauze sponges, not tissue or cotton balls. Peri-Wound Care Topical Primary Dressing Santyl Ointment Discharge Instruction: Apply nickel thick amount to wound bed as instructed Secondary Dressing Woven Gauze Sponge, Non-Sterile 4x4 in Discharge Instruction: Apply over primary dressing as directed. Bordered Gauze, 4x4 in Discharge Instruction: Apply over primary dressing as directed. Secured With Compression Wrap Compression Stockings Environmental education officer) Signed: 01/16/2022 4:46:25 PM By: Baruch Gouty RN, BSN Entered By: Baruch Gouty on 01/16/2022 10:48:08 -------------------------------------------------------------------------------- Vitals Details  Patient Name: Date of Service: Jose Robinson 01/16/2022 10:15 A M Medical Record Number: [id number] Patient Account Number: [id number] Date of Birth/Sex: Treating RN: [birth year] (66 y.o. Jose Robinson Primary Care Clarivel Callaway: PCP, NO Other Clinician: Referring Jahzir Strohmeier: Treating Rache Klimaszewski/Extender: Cheree Ditto in Treatment: 5 Vital Signs Time Taken: 10:32 Temperature (F): 98.7 Height (in): 71 Pulse (bpm): 97 Source: Stated Respiratory Rate (breaths/min): 18 Weight (lbs): 144 Blood Pressure (mmHg): 109/70 Source: Stated Capillary Blood Glucose (mg/dl): 118 Body Mass Index  (BMI): 20.1 Reference Range: 80 - 120 mg / dl Notes glucose per pt report this am Electronic Signature(s) Signed: 01/16/2022 4:46:25 PM By: Baruch Gouty RN, BSN Entered By: Baruch Gouty on 01/16/2022 10:33:12

## 2022-01-16 NOTE — Progress Notes (Deleted)
Office Note     CC:  *** Requesting Provider:  Ricard Dillon, MD  HPI: Jose Robinson is a 66 y.o. (1956-11-14) male presenting at the request of Dr. Linton Ham for evaluation of bilateral lower extremity wounds.  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  On exam today   The pt is *** on a statin for cholesterol management.  The pt is *** on a daily aspirin.   Other AC:  *** The pt is *** on medication for hypertension.   The pt is *** diabetic.  Tobacco hx:  ***  Past Medical History:  Diagnosis Date   Diabetes mellitus without complication ([place])    ESRD on hemodialysis ([place])    Hypertension     Past Surgical History:  Procedure Laterality Date   BACK SURGERY     BELOW KNEE LEG AMPUTATION      Social History   Socioeconomic History   Marital status: Single    Spouse name: Not on file   Number of children: Not on file   Years of education: Not on file   Highest education level: Not on file  Occupational History   Not on file  Tobacco Use   Smoking status: Every Day    Packs/day: 0.50    Types: Cigarettes   Smokeless tobacco: Never  Substance and Sexual Activity   Alcohol use: Not Currently   Drug use: Not Currently   Sexual activity: Not on file  Other Topics Concern   Not on file  Social History Narrative   Not on file   Social Determinants of Health   Financial Resource Strain: Not on file  Food Insecurity: Not on file  Transportation Needs: Not on file  Physical Activity: Not on file  Stress: Not on file  Social Connections: Not on file  Intimate Partner Violence: Not on file   *** Family History  Family history unknown: Yes    Current Outpatient Medications  Medication Sig Dispense Refill   alum & mag hydroxide-simeth  (MAALOX/MYLANTA) 200-200-20 MG/5ML suspension Take 10 mLs by mouth 4 (four) times daily -  with meals and at bedtime. As needed for indigestion     aspirin 81 MG chewable tablet Chew 1 tablet (81 mg total) by mouth daily. 30 tablet 3   atorvastatin (LIPITOR) 20 MG tablet Take 1 tablet (20 mg total) by mouth daily. 30 tablet 0   blood glucose meter kit and supplies KIT Dispense based on patient and insurance preference. Use up to four times daily as directed. (FOR ICD-9 250.00, 250.01). (Patient taking differently: Inject 1 each into the skin See admin instructions. Dispense based on patient and insurance preference. Use up to four times daily as directed. (FOR ICD-9 250.00, 250.01).) 1 each 0   brimonidine (ALPHAGAN) 0.2 % ophthalmic solution Place 1 drop into both eyes in the morning and at bedtime.     calcitRIOL (ROCALTROL) 0.5 MCG capsule Take 2 capsules (1 mcg total) by mouth Every Tuesday,Thursday,and Saturday with dialysis. 28 capsule 0   calcium acetate (PHOSLO) 667 MG capsule Take 2 capsules (1,334 mg total) by mouth 3 (three) times daily with meals. 180 capsule 0   calcium elemental as carbonate (CALCIUM ANTACID ULTRA STRENGTH) 400 MG chewable tablet Chew 2,000 mg by mouth daily.     cetirizine (ZYRTEC) 10  MG tablet Take 10 mg by mouth daily.     donepezil (ARICEPT) 10 MG tablet Take 10 mg by mouth daily.     Dulaglutide (TRULICITY) 1.5 BW/3.8LH SOPN Inject 1.5 mg into the skin every Thursday.     famotidine (PEPCID) 20 MG tablet Take 0.5 tablets (10 mg total) by mouth daily. 30 tablet 0   gabapentin (NEURONTIN) 100 MG capsule Take 200 mg by mouth 3 (three) times daily.     glucose 4 GM chewable tablet Chew 2 tablets (8 g total) by mouth 4 (four) times daily as needed for low blood sugar (for CBG<80). 50 tablet 0   guaiFENesin-dextromethorphan (ROBITUSSIN DM) 100-10 MG/5ML syrup Take 10 mLs by mouth every 4 (four) hours as needed for cough (chest congestion). 118 mL 0   insulin aspart  (NOVOLOG FLEXPEN) 100 UNIT/ML FlexPen Inject 3 Units into the skin 3 (three) times daily with meals as needed for high blood sugar (CBG>180). (Patient taking differently: Inject 1-8 Units into the skin See admin instructions. Check FSBS before each meal at bedtime and inject per SSI: 0-150 = 0 units   150-200 = 1 unit   201 - 250 = 2 units   251 - 250 = 3 units   350 - 400 = 5 units   Greater than 400 = 8 units (Prime pen with 2 units prior to each use).) 15 mL 11   Insulin Glargine (BASAGLAR KWIKPEN) 100 UNIT/ML Inject 10 Units into the skin at bedtime.     latanoprost (XALATAN) 0.005 % ophthalmic solution Place 1 drop into both eyes at bedtime.     lidocaine (LIDODERM) 5 % Place 1 patch onto the skin daily. Remove & Discard patch within 12 hours or as directed by MD (Patient not taking: Reported on 08/01/2021) 30 patch 0   loperamide (IMODIUM A-D) 2 MG tablet Take 2-4 mg by mouth See admin instructions. 4 mg after 1st loose stool, then 2 mg after each loose stool. Max 4 tabs in 24 hours     loratadine (CLARITIN) 10 MG tablet Take 10 mg by mouth daily.     Menthol (HALLS COUGH DROPS) 7.6 MG LOZG Use as directed 1 lozenge in the mouth or throat daily as needed (cough).     midodrine (PROAMATINE) 10 MG tablet Take 10 mg by mouth See admin instructions. 22m every Tuesday, Thursday, Saturday, and Sunday     midodrine (PROAMATINE) 10 MG tablet Take 10 mg by mouth daily as needed (during dialysis treatment if BP drops).     midodrine (PROAMATINE) 5 MG tablet Take 1 tablet (5 mg total) by mouth every Tuesday, Thursday, Saturday, and Sunday. (Patient not taking: Reported on 11/16/2021) 16 tablet 0   mupirocin cream (BACTROBAN) 2 % Apply 1 application topically 2 (two) times daily. 15 g 0   naproxen sodium (ALEVE) 220 MG tablet Take 440 mg by mouth 2 (two) times daily as needed (pain/headache).     nystatin ointment (MYCOSTATIN) Apply 1 application topically daily.     ondansetron (ZOFRAN ODT) 4 MG  disintegrating tablet Take 1 tablet (4 mg total) by mouth every 8 (eight) hours as needed for nausea or vomiting. 20 tablet 0   Pancrelipase, Lip-Prot-Amyl, (ZENPEP) 5000-24000 units CPEP Take 1 capsule by mouth 3 (three) times daily with meals.     pantoprazole (PROTONIX) 40 MG tablet Take 1 tablet (40 mg total) by mouth daily. (Patient taking differently: Take 40 mg by mouth 2 (two) times daily before a meal.)  30 tablet 0   REFRESH TEARS 0.5 % SOLN Place 1 drop into both eyes every hour as needed for dry eyes.     No current facility-administered medications for this visit.    Allergies  Allergen Reactions   Acetaminophen Palpitations   Prednisone Other (See Comments)    Severe hallucinations/paranoid delusions after steroid premedications for contrast allergy, required IV haldol and restraints.    Ivp Dye [Iodinated Contrast Media] Hives     REVIEW OF SYSTEMS:  *** _0  denotes positive finding, _1  denotes negative finding Cardiac  Comments:  Chest pain or chest pressure:    Shortness of breath upon exertion:    Short of breath when lying flat:    Irregular heart rhythm:        Vascular    Pain in calf, thigh, or hip brought on by ambulation:    Pain in feet at night that wakes you up from your sleep:     Blood clot in your veins:    Leg swelling:         Pulmonary    Oxygen at home:    Productive cough:     Wheezing:         Neurologic    Sudden weakness in arms or legs:     Sudden numbness in arms or legs:     Sudden onset of difficulty speaking or slurred speech:    Temporary loss of vision in one eye:     Problems with dizziness:         Gastrointestinal    Blood in stool:     Vomited blood:         Genitourinary    Burning when urinating:     Blood in urine:        Psychiatric    Major depression:         Hematologic    Bleeding problems:    Problems with blood clotting too easily:        Skin    Rashes or ulcers:        Constitutional    Fever  or chills:      PHYSICAL EXAMINATION:  There were no vitals filed for this visit.  General:  WDWN in NAD; vital signs documented above Gait: Not observed HENT: WNL, normocephalic Pulmonary: normal non-labored breathing , without wheezing Cardiac: {Desc; regular/irreg:14544} HR, bruit*** Abdomen: soft, NT, no masses Skin: {With/Without:20273} rashes Vascular Exam/Pulses:  Right Left  Radial {Exam; arterial pulse strength 0-4:30167} {Exam; arterial pulse strength 0-4:30167}  Ulnar {Exam; arterial pulse strength 0-4:30167} {Exam; arterial pulse strength 0-4:30167}  Femoral {Exam; arterial pulse strength 0-4:30167} {Exam; arterial pulse strength 0-4:30167}  Popliteal {Exam; arterial pulse strength 0-4:30167} {Exam; arterial pulse strength 0-4:30167}  DP {Exam; arterial pulse strength 0-4:30167} {Exam; arterial pulse strength 0-4:30167}  PT {Exam; arterial pulse strength 0-4:30167} {Exam; arterial pulse strength 0-4:30167}   Extremities: {With/Without:20273} ischemic changes, {With/Without:20273} Gangrene , {With/Without:20273} cellulitis; {With/Without:20273} open wounds;  Musculoskeletal: no muscle wasting or atrophy  Neurologic: A&O X 3;  No focal weakness or paresthesias are detected Psychiatric:  The pt has {Desc; normal/abnormal:11317::"Normal"} affect.   Non-Invasive Vascular Imaging:   ***    ASSESSMENT/PLAN: Jose Robinson is a 66 y.o. male presenting with ***   ***   Broadus John, MD [facility] [phone]

## 2022-01-17 NOTE — Progress Notes (Signed)
Jose, Robinson ([id number]) Visit Report for 12/18/2021 Arrival Information Details Patient Name: Date of Service: Jose Robinson 12/18/2021 3:30 PM Medical Record Number: [id number] Patient Account Number: [id number] Date of Birth/Sex: Treating RN: 09-Jun-1956 (66 y.o. Jose Robinson Primary Care Riel Hirschman: PCP, NO Other Clinician: Referring Meyer Arora: Treating Orien Mayhall/Extender: Cheree Ditto in Treatment: 0 Visit Information History Since Last Visit Added or deleted any medications: No Patient Arrived: Wheel Chair Any new allergies or adverse reactions: No Arrival Time: 15:56 Had a fall or experienced change in No Accompanied By: self activities of daily living that may affect Transfer Assistance: None risk of falls: Patient Requires Transmission-Based Precautions: No Signs or symptoms of abuse/neglect since last visito No Patient Has Alerts: No Hospitalized since last visit: No Implantable device outside of the clinic excluding No cellular tissue based products placed in the center since last visit: Has Dressing in Place as Prescribed: Yes Pain Present Now: No Electronic Signature(s) Signed: 12/18/2021 5:17:23 PM By: Dellie Catholic RN Entered By: Dellie Catholic on 12/18/2021 15:56:32 -------------------------------------------------------------------------------- Clinic Level of Care Assessment Details Patient Name: Date of Service: Jose Robinson 12/18/2021 3:30 PM Medical Record Number: [id number] Patient Account Number: [id number] Date of Birth/Sex: Treating RN: 11/01/1956 (67 y.o. Burnadette Robinson, Jose Robinson Primary Care Ferdie Bakken: PCP, NO Other Clinician: Referring Markeria Goetsch: Treating Vashti Bolanos/Extender: Cheree Ditto in Treatment: 0 Clinic Level of Care Assessment Items TOOL 4 Quantity Score X- 1 0 Use when only an EandM is performed on FOLLOW-UP visit ASSESSMENTS - Nursing Assessment / Reassessment X- 1 10 Reassessment of Co-morbidities  (includes updates in patient status) X- 1 5 Reassessment of Adherence to Treatment Plan ASSESSMENTS - Wound and Skin A ssessment / Reassessment []  - 0 Simple Wound Assessment / Reassessment - one wound X- 4 5 Complex Wound Assessment / Reassessment - multiple wounds []  - 0 Dermatologic / Skin Assessment (not related to wound area) ASSESSMENTS - Focused Assessment []  - 0 Circumferential Edema Measurements - multi extremities []  - 0 Nutritional Assessment / Counseling / Intervention []  - 0 Lower Extremity Assessment (monofilament, tuning fork, pulses) []  - 0 Peripheral Arterial Disease Assessment (using hand held doppler) ASSESSMENTS - Ostomy and/or Continence Assessment and Care []  - 0 Incontinence Assessment and Management []  - 0 Ostomy Care Assessment and Management (repouching, etc.) PROCESS - Coordination of Care []  - 0 Simple Patient / Family Education for ongoing care X- 1 20 Complex (extensive) Patient / Family Education for ongoing care X- 1 10 Staff obtains Programmer, systems, Records, T Results / Process Orders est X- 1 10 Staff telephones HHA, Nursing Homes / Clarify orders / etc []  - 0 Routine Transfer to another Facility (non-emergent condition) []  - 0 Routine Hospital Admission (non-emergent condition) []  - 0 New Admissions / Biomedical engineer / Ordering NPWT Apligraf, etc. , []  - 0 Emergency Hospital Admission (emergent condition) []  - 0 Simple Discharge Coordination X- 1 15 Complex (extensive) Discharge Coordination PROCESS - Special Needs []  - 0 Pediatric / Minor Patient Management []  - 0 Isolation Patient Management []  - 0 Hearing / Language / Visual special needs []  - 0 Assessment of Community assistance (transportation, D/C planning, etc.) []  - 0 Additional assistance / Altered mentation []  - 0 Support Surface(s) Assessment (bed, cushion, seat, etc.) INTERVENTIONS - Wound Cleansing / Measurement []  - 0 Simple Wound Cleansing - one  wound X- 4 5 Complex Wound Cleansing - multiple wounds X- 1 5 Wound Imaging (photographs - any number of wounds) []  - 0  Wound Tracing (instead of photographs) []  - 0 Simple Wound Measurement - one wound X- 4 5 Complex Wound Measurement - multiple wounds INTERVENTIONS - Wound Dressings []  - 0 Small Wound Dressing one or multiple wounds X- 4 15 Medium Wound Dressing one or multiple wounds []  - 0 Large Wound Dressing one or multiple wounds X- 1 5 Application of Medications - topical []  - 0 Application of Medications - injection INTERVENTIONS - Miscellaneous []  - 0 External ear exam []  - 0 Specimen Collection (cultures, biopsies, blood, body fluids, etc.) []  - 0 Specimen(s) / Culture(s) sent or taken to Lab for analysis []  - 0 Patient Transfer (multiple staff / Civil Service fast streamer / Similar devices) []  - 0 Simple Staple / Suture removal (25 or less) []  - 0 Complex Staple / Suture removal (26 or more) []  - 0 Hypo / Hyperglycemic Management (close monitor of Blood Glucose) []  - 0 Ankle / Brachial Index (ABI) - do not check if billed separately X- 1 5 Vital Signs Has the patient been seen at the hospital within the last three years: Yes Total Score: 205 Level Of Care: New/Established - Level 5 Electronic Signature(s) Signed: 01/17/2022 12:37:40 PM By: Rhae Hammock RN Entered By: Rhae Hammock on 12/18/2021 16:19:52 -------------------------------------------------------------------------------- Encounter Discharge Information Details Patient Name: Date of Service: Jose Robinson, Jose Robinson. 12/18/2021 3:30 PM Medical Record Number: [id number] Patient Account Number: [id number] Date of Birth/Sex: Treating RN: December 27, 1956 (66 y.o. Erie Noe Primary Care Breckin Savannah: PCP, NO Other Clinician: Referring Ridley Dileo: Treating Cordale Manera/Extender: Cheree Ditto in Treatment: 0 Encounter Discharge Information Items Discharge Condition: Stable Ambulatory Status:  Wheelchair Discharge Destination: Home Transportation: Private Auto Accompanied By: self Schedule Follow-up Appointment: Yes Clinical Summary of Care: Patient Declined Electronic Signature(s) Signed: 12/18/2021 5:17:23 PM By: Dellie Catholic RN Entered By: Dellie Catholic on 12/18/2021 17:15:25 -------------------------------------------------------------------------------- Lower Extremity Assessment Details Patient Name: Date of Service: Jose Robinson 12/18/2021 3:30 PM Medical Record Number: [id number] Patient Account Number: [id number] Date of Birth/Sex: Treating RN: Jun 05, 1956 (66 y.o. Jose Robinson Primary Care Drexler Maland: PCP, NO Other Clinician: Referring Taquila Leys: Treating Yadiel Aubry/Extender: Cheree Ditto in Treatment: 0 Edema Assessment Assessed: Shirlyn Goltz: No] [Right: No] Edema: [Left: Ye] [Right: s] Calf Left: Right: Point of Measurement: 35 cm From Medial Instep 33.8 cm Ankle Left: Right: Point of Measurement: 11 cm From Medial Instep 23 cm Electronic Signature(s) Signed: 12/18/2021 5:17:23 PM By: Dellie Catholic RN Entered By: Dellie Catholic on 12/18/2021 15:58:23 -------------------------------------------------------------------------------- Multi Wound Chart Details Patient Name: Date of Service: Jose Robinson, Jose Robinson. 12/18/2021 3:30 PM Medical Record Number: [id number] Patient Account Number: [id number] Date of Birth/Sex: Treating RN: [birth year] (67 y.o. Janyth Contes Primary Care Deloy Archey: PCP, NO Other Clinician: Referring Kellar Westberg: Treating Gjon Letarte/Extender: Cheree Ditto in Treatment: 0 Vital Signs Height(in): 71 Pulse(bpm): 91 Weight(lbs): 144 Blood Pressure(mmHg): 135/81 Body Mass Index(BMI): 20 Temperature(F): 98.1 Respiratory Rate(breaths/min): 18 Photos: Left Knee Right Knee Right, Distal Knee Wound Location: Gradually Appeared Gradually Appeared Gradually Appeared Wounding Event: Diabetic Wound/Ulcer of the  Lower Diabetic Wound/Ulcer of the Lower Diabetic Wound/Ulcer of the Lower Primary Etiology: Extremity Extremity Extremity Congestive Heart Failure, Congestive Heart Failure, Congestive Heart Failure, Comorbid History: Hypertension, Type II Diabetes, End Hypertension, Type II Diabetes, End Hypertension, Type II Diabetes, End Stage Renal Disease, Neuropathy Stage Renal Disease, Neuropathy Stage Renal Disease, Neuropathy 11/12/2021 11/12/2021 11/12/2021 Date Acquired: 0 0 0 Weeks of Treatment: Open Open Open Wound Status: 2.5x1x0.1 0.7x0.7x0.1 2x2x0.1 Measurements L x W x D (  cm) 1.963 0.385 3.142 A (cm) : rea 0.196 0.038 0.314 Volume (cm) : 24.90% 0.00% 0.00% % Reduction in A rea: 25.20% 50.60% 50.00% % Reduction in Volume: Grade 2 Grade 2 Grade 2 Classification: Medium Medium Medium Exudate A mount: Serosanguineous Serosanguineous Serosanguineous Exudate Type: red, brown red, brown red, brown Exudate Color: Distinct, outline attached Distinct, outline attached Distinct, outline attached Wound Margin: Large (67-100%) Small (1-33%) Medium (34-66%) Granulation A mount: Pink Pink Pink Granulation Quality: Small (1-33%) Large (67-100%) Medium (34-66%) Necrotic A mount: Eschar, Adherent Slough Adherent [employer] Necrotic Tissue: Fat Layer (Subcutaneous Tissue): Yes Fat Layer (Subcutaneous Tissue): Yes Fat Layer (Subcutaneous Tissue): Yes Exposed Structures: Fascia: No Fascia: No Fascia: No Tendon: No Tendon: No Tendon: No Muscle: No Muscle: No Muscle: No Joint: No Joint: No Joint: No Bone: No Bone: No Bone: No None None None Epithelialization: Wound Number: 4 N/A N/A Photos: N/A N/A Left, Distal Knee N/A N/A Wound Location: Not Known N/A N/A Wounding Event: T be determined o N/A N/A Primary Etiology: Congestive Heart Failure, N/A N/A Comorbid History: Hypertension, Type II Diabetes, End Stage Renal Disease, Neuropathy 12/18/2021 N/A  N/A Date Acquired: 0 N/A N/A Weeks of Treatment: Open N/A N/A Wound Status: 0.6x0.7x0.1 N/A N/A Measurements L x W x D (cm) 0.33 N/A N/A A (cm) : rea 0.033 N/A N/A Volume (cm) : 0.00% N/A N/A % Reduction in Area: 0.00% N/A N/A % Reduction in Volume: Full Thickness Without Exposed N/A N/A Classification: Support Structures Medium N/A N/A Exudate A mount: Serosanguineous N/A N/A Exudate Type: red, brown N/A N/A Exudate Color: N/A N/A N/A Wound Margin: None Present (0%) N/A N/A Granulation Amount: N/A N/A N/A Granulation Quality: Large (67-100%) N/A N/A Necrotic Amount: Adherent Slough N/A N/A Necrotic Tissue: Fat Layer (Subcutaneous Tissue): Yes N/A N/A Exposed Structures: Fascia: No Tendon: No Muscle: No Joint: No Bone: No Small (1-33%) N/A N/A Epithelialization: Treatment Notes Electronic Signature(s) Signed: 12/18/2021 4:29:35 PM By: Linton Ham MD Signed: 12/18/2021 5:35:33 PM By: Levan Hurst RN, BSN Entered By: Linton Ham on 12/18/2021 16:19:04 -------------------------------------------------------------------------------- Multi-Disciplinary Care Plan Details Patient Name: Date of Service: Jose Robinson, Jose Robinson 12/18/2021 3:30 PM Medical Record Number: [id number] Patient Account Number: [id number] Date of Birth/Sex: Treating RN: 1956/04/17 (66 y.o. Erie Noe Primary Care Dayshia Ballinas: PCP, NO Other Clinician: Referring Aster Eckrich: Treating Fancy Dunkley/Extender: Cheree Ditto in Treatment: 0 Active Inactive Wound/Skin Impairment Nursing Diagnoses: Impaired tissue integrity Knowledge deficit related to ulceration/compromised skin integrity Goals: Patient will have a decrease in wound volume by X% from date: (specify in notes) Date Initiated: 12/12/2021 Target Resolution Date: 01/02/2022 Goal Status: Active Patient/caregiver will verbalize understanding of skin care regimen Date Initiated: 12/12/2021 Target Resolution  Date: 01/02/2022 Goal Status: Active Ulcer/skin breakdown will have a volume reduction of 30% by week 4 Date Initiated: 12/12/2021 Target Resolution Date: 01/03/2022 Goal Status: Active Interventions: Assess patient/caregiver ability to obtain necessary supplies Assess patient/caregiver ability to perform ulcer/skin care regimen upon admission and as needed Assess ulceration(s) every visit Notes: Electronic Signature(s) Signed: 01/17/2022 12:37:40 PM By: Rhae Hammock RN Entered By: Rhae Hammock on 12/18/2021 16:55:24 -------------------------------------------------------------------------------- Pain Assessment Details Patient Name: Date of Service: Jose Robinson 12/18/2021 3:30 PM Medical Record Number: [id number] Patient Account Number: [id number] Date of Birth/Sex: Treating RN: 1956-10-23 (66 y.o. Jose Robinson Primary Care Jamesetta Greenhalgh: PCP, NO Other Clinician: Referring Saraiya Kozma: Treating Khushi Zupko/Extender: Cheree Ditto in Treatment: 0 Active Problems Location of Pain Severity and Description of Pain Patient Has  Paino No Site Locations Pain Management and Medication Current Pain Management: Electronic Signature(s) Signed: 12/18/2021 5:17:23 PM By: Dellie Catholic RN Entered By: Dellie Catholic on 12/18/2021 15:57:17 -------------------------------------------------------------------------------- Patient/Caregiver Education Details Patient Name: Date of Service: Jose Robinson 12/20/2022andnbsp3:30 PM Medical Record Number: [id number] Patient Account Number: [id number] Date of Birth/Gender: Treating RN: 01-10-1956 (66 y.o. Erie Noe Primary Care Physician: PCP, NO Other Clinician: Referring Physician: Treating Physician/Extender: Cheree Ditto in Treatment: 0 Education Assessment Education Provided To: Patient Education Topics Provided Basic Hygiene: Welcome T [facility] [place]: o Wound/Skin  Impairment: Electronic Signature(s) Signed: 01/17/2022 12:37:40 PM By: Rhae Hammock RN Entered By: Rhae Hammock on 12/18/2021 16:17:21 -------------------------------------------------------------------------------- Wound Assessment Details Patient Name: Date of Service: Jose Robinson, Jose Robinson 12/18/2021 3:30 PM Medical Record Number: [id number] Patient Account Number: [id number] Date of Birth/Sex: Treating RN: 04/18/1956 (66 y.o. Jose Robinson Primary Care Ryman Rathgeber: PCP, NO Other Clinician: Referring Miette Molenda: Treating Yeshaya Vath/Extender: Cheree Ditto in Treatment: 0 Wound Status Wound Number: 1 Primary Diabetic Wound/Ulcer of the Lower Extremity Etiology: Wound Location: Left Knee Wound Open Wounding Event: Gradually Appeared Status: Date Acquired: 11/12/2021 Comorbid Congestive Heart Failure, Hypertension, Type II Diabetes, End Weeks Of Treatment: 0 History: Stage Renal Disease, Neuropathy Clustered Wound: No Photos Wound Measurements Length: (cm) 2.5 Width: (cm) 1 Depth: (cm) 0.1 Area: (cm) 1.963 Volume: (cm) 0.196 % Reduction in Area: 24.9% % Reduction in Volume: 25.2% Epithelialization: None Tunneling: No Undermining: No Wound Description Classification: Grade 2 Wound Margin: Distinct, outline attached Exudate Amount: Medium Exudate Type: Serosanguineous Exudate Color: red, brown Foul Odor After Cleansing: No Slough/Fibrino Yes Wound Bed Granulation Amount: Large (67-100%) Exposed Structure Granulation Quality: Pink Fascia Exposed: No Necrotic Amount: Small (1-33%) Fat Layer (Subcutaneous Tissue) Exposed: Yes Necrotic Quality: Eschar, Adherent Slough Tendon Exposed: No Muscle Exposed: No Joint Exposed: No Bone Exposed: No Electronic Signature(s) Signed: 12/18/2021 5:17:23 PM By: Dellie Catholic RN Signed: 12/20/2021 9:32:36 AM By: Sandre Kitty Entered By: Sandre Kitty on 12/18/2021  15:53:47 -------------------------------------------------------------------------------- Wound Assessment Details Patient Name: Date of Service: Jose Robinson, Jose Robinson. 12/18/2021 3:30 PM Medical Record Number: [id number] Patient Account Number: [id number] Date of Birth/Sex: Treating RN: [birth year] (66 y.o. Jose Robinson Primary Care Takera Rayl: PCP, NO Other Clinician: Referring Nakayla Rorabaugh: Treating Aleysia Oltmann/Extender: Cheree Ditto in Treatment: 0 Wound Status Wound Number: 2 Primary Diabetic Wound/Ulcer of the Lower Extremity Etiology: Wound Location: Right Knee Wound Open Wounding Event: Gradually Appeared Status: Date Acquired: 11/12/2021 Comorbid Congestive Heart Failure, Hypertension, Type II Diabetes, End Weeks Of Treatment: 0 History: Stage Renal Disease, Neuropathy Clustered Wound: No Photos Wound Measurements Length: (cm) 0.7 Width: (cm) 0.7 Depth: (cm) 0.1 Area: (cm) 0.385 Volume: (cm) 0.038 % Reduction in Area: 0% % Reduction in Volume: 50.6% Epithelialization: None Tunneling: No Undermining: No Wound Description Classification: Grade 2 Wound Margin: Distinct, outline attached Exudate Amount: Medium Exudate Type: Serosanguineous Exudate Color: red, brown Foul Odor After Cleansing: No Slough/Fibrino Yes Wound Bed Granulation Amount: Small (1-33%) Exposed Structure Granulation Quality: Pink Fascia Exposed: No Necrotic Amount: Large (67-100%) Fat Layer (Subcutaneous Tissue) Exposed: Yes Necrotic Quality: Adherent Slough Tendon Exposed: No Muscle Exposed: No Joint Exposed: No Bone Exposed: No Electronic Signature(s) Signed: 12/18/2021 5:17:23 PM By: Dellie Catholic RN Signed: 12/20/2021 9:32:36 AM By: Sandre Kitty Entered By: Sandre Kitty on 12/18/2021 15:54:42 -------------------------------------------------------------------------------- Wound Assessment Details Patient Name: Date of Service: Jose Robinson. 12/18/2021 3:30  PM Medical Record Number: [id number] Patient Account Number: [id number] Date of  Birth/Sex: Treating RN: 15-Nov-1956 (66 y.o. Jose Robinson Primary Care Yarenis Cerino: PCP, NO Other Clinician: Referring Lakyra Tippins: Treating Tonda Wiederhold/Extender: Cheree Ditto in Treatment: 0 Wound Status Wound Number: 3 Primary Diabetic Wound/Ulcer of the Lower Extremity Etiology: Wound Location: Right, Distal Knee Wound Open Wounding Event: Gradually Appeared Status: Date Acquired: 11/12/2021 Comorbid Congestive Heart Failure, Hypertension, Type II Diabetes, End Weeks Of Treatment: 0 History: Stage Renal Disease, Neuropathy Clustered Wound: No Photos Wound Measurements Length: (cm) 2 Width: (cm) 2 Depth: (cm) 0.1 Area: (cm) 3.142 Volume: (cm) 0.314 % Reduction in Area: 0% % Reduction in Volume: 50% Epithelialization: None Wound Description Classification: Grade 2 Wound Margin: Distinct, outline attached Exudate Amount: Medium Exudate Type: Serosanguineous Exudate Color: red, brown Foul Odor After Cleansing: No Slough/Fibrino Yes Wound Bed Granulation Amount: Medium (34-66%) Exposed Structure Granulation Quality: Pink Fascia Exposed: No Necrotic Amount: Medium (34-66%) Fat Layer (Subcutaneous Tissue) Exposed: Yes Necrotic Quality: Adherent Slough Tendon Exposed: No Muscle Exposed: No Joint Exposed: No Bone Exposed: No Electronic Signature(s) Signed: 12/18/2021 5:17:23 PM By: Dellie Catholic RN Signed: 12/20/2021 9:32:36 AM By: Sandre Kitty Entered By: Sandre Kitty on 12/18/2021 15:55:22 -------------------------------------------------------------------------------- Wound Assessment Details Patient Name: Date of Service: Jose Robinson, Jose Robinson. 12/18/2021 3:30 PM Medical Record Number: [id number] Patient Account Number: [id number] Date of Birth/Sex: Treating RN: 11-18-1956 (66 y.o. Jose Robinson Primary Care Acquanetta Cabanilla: PCP, NO Other Clinician: Referring  Gunhild Bautch: Treating Styles Fambro/Extender: Cheree Ditto in Treatment: 0 Wound Status Wound Number: 4 Primary T be determined o Etiology: Wound Location: Left, Distal Knee Wound Open Wounding Event: Not Known Status: Date Acquired: 12/18/2021 Comorbid Congestive Heart Failure, Hypertension, Type II Diabetes, End Weeks Of Treatment: 0 History: Stage Renal Disease, Neuropathy Clustered Wound: No Photos Wound Measurements Length: (cm) 0.6 Width: (cm) 0.7 Depth: (cm) 0.1 Area: (cm) 0.33 Volume: (cm) 0.033 % Reduction in Area: 0% % Reduction in Volume: 0% Epithelialization: Small (1-33%) Wound Description Classification: Full Thickness Without Exposed Support Structures Exudate Amount: Medium Exudate Type: Serosanguineous Exudate Color: red, brown Foul Odor After Cleansing: No Slough/Fibrino Yes Wound Bed Granulation Amount: None Present (0%) Exposed Structure Necrotic Amount: Large (67-100%) Fascia Exposed: No Necrotic Quality: Adherent Slough Fat Layer (Subcutaneous Tissue) Exposed: Yes Tendon Exposed: No Muscle Exposed: No Joint Exposed: No Bone Exposed: No Electronic Signature(s) Signed: 12/18/2021 5:17:23 PM By: Dellie Catholic RN Signed: 12/20/2021 9:32:36 AM By: Sandre Kitty Entered By: Sandre Kitty on 12/18/2021 15:51:52 -------------------------------------------------------------------------------- Vitals Details Patient Name: Date of Service: Jose Robinson, Jose Robinson. 12/18/2021 3:30 PM Medical Record Number: [id number] Patient Account Number: [id number] Date of Birth/Sex: Treating RN: 27-Apr-1956 (66 y.o. Jose Robinson Primary Care Kampbell Holaway: PCP, NO Other Clinician: Referring Jeremie Giangrande: Treating Charley Miske/Extender: Cheree Ditto in Treatment: 0 Vital Signs Time Taken: 15:56 Temperature (F): 98.1 Height (in): 71 Pulse (bpm): 91 Weight (lbs): 144 Respiratory Rate (breaths/min): 18 Body Mass Index (BMI): 20.1 Blood Pressure  (mmHg): 135/81 Reference Range: 80 - 120 mg / dl Electronic Signature(s) Signed: 12/18/2021 5:17:23 PM By: Dellie Catholic RN Entered By: Dellie Catholic on 12/18/2021 15:57:04

## 2022-01-17 NOTE — Progress Notes (Signed)
Jose, Robinson ([id number]) Visit Report for 12/18/2021 HPI Details Patient Name: Date of Service: Jose Robinson 12/18/2021 3:30 PM Medical Record Number: [id number] Patient Account Number: [id number] Date of Birth/Sex: Treating RN: 1956/09/16 (66 y.o. Janyth Contes Primary Care Provider: PCP, NO Other Clinician: Referring Provider: Treating Provider/Extender: Cheree Ditto in Treatment: 0 History of Present Illness HPI Description: ADMISSION 12/12/2021 This is a 66 year old man who is a type II diabetic likely with multiple complications. He is self-referred for wounds on his right patella and right posterior patellar tendon also on the left patella. 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 He is a type II diabetic. The patient states the wounds were there for the last 3 to 4 weeks a simply broke open. He complains of pain in the right leg. He was in the ER on October 26 x-rays of the left knee were done that did not show any abnormalities. He was diagnosed with prepatellar bursitis. 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 fib. He was at a wound care center in [facility] I believe in 2016 with a wound on his left leg. Notable that he had a right BKA at [facility]. Presumably he had arterial studies but I could not put my eyes on these today ABI in the left 12/20; comes in today with a new wound on the lower left patella. It seems according the patient there was confusion about who is to apply the Santyl even though the orders from our point of view were fairly clear. He is in a very light care assisted living We do not have a booking for his arterial studies which  will include ABI TBI and arterial Dopplers on the left leg and on the BKA side on the right simply arterial Dopplers Electronic Signature(s) Signed: 12/18/2021 4:29:35 PM By: Linton Ham MD Entered By: Linton Ham on 12/18/2021 16:20:18 -------------------------------------------------------------------------------- Physical Exam Details Patient Name: Date of Service: Jose Robinson 12/18/2021 3:30 PM Medical Record Number: [id number] Patient Account Number: [id number] Date of Birth/Sex: Treating RN: 11-Dec-1956 (66 y.o. Janyth Contes Primary Care Provider: PCP, NO Other Clinician: Referring Provider: Treating Provider/Extender: Cheree Ditto in Treatment: 0 Constitutional Sitting or standing Blood Pressure is within target range for patient.. Pulse regular and within target range for patient.Marland Kitchen Respirations regular, non-labored and within target range.. Temperature is normal and within the target range for the patient.Marland Kitchen Appears in no distress. Notes Wound exam; the patient has superficial wounds on the right infrapatellar tendon the right patella on the left he has wounds over the top of the left patella. He does not complain of pain no debridement is necessary. Peripheral pulses are very difficult to feel Electronic Signature(s) Signed: 12/18/2021 4:29:35 PM By: Linton Ham MD Entered By: Linton Ham on 12/18/2021 16:21:33 -------------------------------------------------------------------------------- Physician Orders Details Patient Name: Date of Service: [name], [name] 12/18/2021 3:30 PM Medical Record Number: [id number] Patient Account Number: [id number] Date of Birth/Sex: Treating RN: 1956/04/02 (66 y.o. Erie Noe Primary Care Provider: PCP, NO Other Clinician: Referring Provider: Treating Provider/Extender: Cheree Ditto in Treatment: 0 Verbal / Phone Orders: No Diagnosis Coding Follow-up Appointments ppointment  in 2  weeks. - Dr. Dellia Nims!!! Return A Other: - Resident to apply dressings. Pt. has been educated on how to dress wounds Bathing/ Shower/ Hygiene May shower with protection but do not get wound dressing(s) wet. Edema Control - Lymphedema / SCD / Other Elevate legs to the level of the heart or above for 30 minutes daily and/or when sitting, a frequency of: Avoid standing for long periods of time. Wound Treatment Wound #1 - Knee Wound Laterality: Left Cleanser: Soap and Water 1 x Per JKD/32 Days Discharge Instructions: May shower and wash wound with dial antibacterial soap and water prior to dressing change. Cleanser: Wound Cleanser (DME) (Generic) 1 x Per Day/15 Days Discharge Instructions: Cleanse the wound with wound cleanser prior to applying a clean dressing using gauze sponges, not tissue or cotton balls. Prim Dressing: Santyl Ointment 1 x Per Day/15 Days ary Discharge Instructions: Apply nickel thick amount to wound bed as instructed Secondary Dressing: Woven Gauze Sponge, Non-Sterile 4x4 in (DME) (Generic) 1 x Per Day/15 Days Discharge Instructions: Apply over primary dressing as directed. Secondary Dressing: Bordered Gauze, 4x4 in (DME) (Generic) 1 x Per Day/15 Days Discharge Instructions: Apply over primary dressing as directed. Wound #2 - Knee Wound Laterality: Right Cleanser: Soap and Water 1 x Per IZT/24 Days Discharge Instructions: May shower and wash wound with dial antibacterial soap and water prior to dressing change. Cleanser: Wound Cleanser (DME) (Generic) 1 x Per Day/15 Days Discharge Instructions: Cleanse the wound with wound cleanser prior to applying a clean dressing using gauze sponges, not tissue or cotton balls. Prim Dressing: Santyl Ointment 1 x Per Day/15 Days ary Discharge Instructions: Apply nickel thick amount to wound bed as instructed Secondary Dressing: Woven Gauze Sponge, Non-Sterile 4x4 in (DME) (Generic) 1 x Per Day/15 Days Discharge Instructions: Apply over  primary dressing as directed. Secondary Dressing: Bordered Gauze, 4x4 in (DME) (Generic) 1 x Per Day/15 Days Discharge Instructions: Apply over primary dressing as directed. Wound #3 - Knee Wound Laterality: Right, Distal Cleanser: Soap and Water 1 x Per PYK/99 Days Discharge Instructions: May shower and wash wound with dial antibacterial soap and water prior to dressing change. Cleanser: Wound Cleanser (DME) (Generic) 1 x Per Day/15 Days Discharge Instructions: Cleanse the wound with wound cleanser prior to applying a clean dressing using gauze sponges, not tissue or cotton balls. Prim Dressing: Santyl Ointment 1 x Per Day/15 Days ary Discharge Instructions: Apply nickel thick amount to wound bed as instructed Secondary Dressing: Woven Gauze Sponge, Non-Sterile 4x4 in (DME) (Generic) 1 x Per Day/15 Days Discharge Instructions: Apply over primary dressing as directed. Secondary Dressing: Bordered Gauze, 4x4 in (DME) (Generic) 1 x Per Day/15 Days Discharge Instructions: Apply over primary dressing as directed. Wound #4 - Knee Wound Laterality: Left, Distal Cleanser: Soap and Water 1 x Per IPJ/82 Days Discharge Instructions: May shower and wash wound with dial antibacterial soap and water prior to dressing change. Cleanser: Wound Cleanser (DME) (Generic) 1 x Per Day/15 Days Discharge Instructions: Cleanse the wound with wound cleanser prior to applying a clean dressing using gauze sponges, not tissue or cotton balls. Prim Dressing: Santyl Ointment 1 x Per Day/15 Days ary Discharge Instructions: Apply nickel thick amount to wound bed as instructed Secondary Dressing: Woven Gauze Sponge, Non-Sterile 4x4 in (DME) (Generic) 1 x Per Day/15 Days Discharge Instructions: Apply over primary dressing as directed. Secondary Dressing: Bordered Gauze, 4x4 in (DME) (Generic) 1 x Per Day/15 Days Discharge Instructions: Apply over primary dressing as directed. Electronic Signature(s)  Signed: 12/19/2021  4:11:41 PM By: Linton Ham MD Signed: 01/17/2022 12:37:40 PM By: Rhae Hammock RN Previous Signature: 12/18/2021 4:29:35 PM Version By: Linton Ham MD Entered By: Rhae Hammock on 12/18/2021 16:59:30 -------------------------------------------------------------------------------- Problem List Details Patient Name: Date of Service: [name], [name] 12/18/2021 3:30 PM Medical Record Number: [id number] Patient Account Number: [id number] Date of Birth/Sex: Treating RN: 1956-01-05 (66 y.o. Janyth Contes Primary Care Provider: PCP, NO Other Clinician: Referring Provider: Treating Provider/Extender: Cheree Ditto in Treatment: 0 Active Problems ICD-10 Encounter Code Description Active Date MDM Diagnosis E11.51 Type 2 diabetes mellitus with diabetic peripheral angiopathy without gangrene 12/12/2021 No Yes L97.818 Non-pressure chronic ulcer of other part of right lower leg with other specified 12/12/2021 No Yes severity L97.828 Non-pressure chronic ulcer of other part of left lower leg with other specified 12/12/2021 No Yes severity Inactive Problems Resolved Problems Electronic Signature(s) Signed: 12/18/2021 4:29:35 PM By: Linton Ham MD Entered By: Linton Ham on 12/18/2021 16:18:54 -------------------------------------------------------------------------------- Progress Note Details Patient Name: Date of Service: [name], [name] 12/18/2021 3:30 PM Medical Record Number: [id number] Patient Account Number: [id number] Date of Birth/Sex: Treating RN: October 27, 1956 (66 y.o. Janyth Contes Primary Care Provider: PCP, NO Other Clinician: Referring Provider: Treating Provider/Extender: Cheree Ditto in Treatment: 0 Subjective History of Present Illness (HPI) ADMISSION 12/12/2021 This is a 66 year old man who is a type II diabetic likely with multiple complications. He is self-referred for wounds on his right patella and right  posterior patellar tendon also on the left patella. 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 He is a type II diabetic. The patient states the wounds were there for the last 3 to 4 weeks a simply broke open. He complains of pain in the right leg. He was in the ER on October 26 x-rays of the left knee were done that did not show any abnormalities. He was diagnosed with prepatellar bursitis. 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 fib. He was at a wound care center in [facility] I believe in 2016 with a wound on his left leg. Notable that he had a right BKA at [facility]. Presumably he had arterial studies but I could not put my eyes on these today ABI in the left 12/20; comes in today with a new wound on the lower left patella. It seems according the patient there was confusion about who is to apply the Santyl even though the orders from our point of view were fairly clear. He is in a very light care assisted living We do not have a booking for his arterial studies which will include ABI TBI and arterial Dopplers on the left leg and on the BKA side on the right simply arterial Dopplers Objective Constitutional Sitting or standing Blood Pressure is within target range for patient.. Pulse regular and within target range for patient.Marland Kitchen Respirations regular, non-labored and within target range.. Temperature is normal and within the target range for the patient.Marland Kitchen Appears in no distress. Vitals Time Taken: 3:56 PM, Height: 71 in, Weight: 144 lbs, BMI: 20.1, Temperature: 98.1 F, Pulse: 91 bpm, Respiratory Rate: 18 breaths/min, Blood Pressure: 135/81 mmHg. General Notes: Wound exam; the patient  has  superficial wounds on the right infrapatellar tendon the right patella on the left he has wounds over the top of the left patella. He does not complain of pain no debridement is necessary. Peripheral pulses are very difficult to feel Integumentary (Hair, Skin) Wound #1 status is Open. Original cause of wound was Gradually Appeared. The date acquired was: 11/12/2021. The wound is located on the Left Knee. The wound measures 2.5cm length x 1cm width x 0.1cm depth; 1.963cm^2 area and 0.196cm^3 volume. There is Fat Layer (Subcutaneous Tissue) exposed. There is no tunneling or undermining noted. There is a medium amount of serosanguineous drainage noted. The wound margin is distinct with the outline attached to the wound base. There is large (67-100%) pink granulation within the wound bed. There is a small (1-33%) amount of necrotic tissue within the wound bed including Eschar and Adherent Slough. Wound #2 status is Open. Original cause of wound was Gradually Appeared. The date acquired was: 11/12/2021. The wound is located on the Right Knee. The wound measures 0.7cm length x 0.7cm width x 0.1cm depth; 0.385cm^2 area and 0.038cm^3 volume. There is Fat Layer (Subcutaneous Tissue) exposed. There is no tunneling or undermining noted. There is a medium amount of serosanguineous drainage noted. The wound margin is distinct with the outline attached to the wound base. There is small (1-33%) pink granulation within the wound bed. There is a large (67-100%) amount of necrotic tissue within the wound bed including Adherent Slough. Wound #3 status is Open. Original cause of wound was Gradually Appeared. The date acquired was: 11/12/2021. The wound is located on the Right,Distal Knee. The wound measures 2cm length x 2cm width x 0.1cm depth; 3.142cm^2 area and 0.314cm^3 volume. There is Fat Layer (Subcutaneous Tissue) exposed. There is a medium amount of serosanguineous drainage noted. The wound margin is distinct  with the outline attached to the wound base. There is medium (34- 66%) pink granulation within the wound bed. There is a medium (34-66%) amount of necrotic tissue within the wound bed including Adherent Slough. Wound #4 status is Open. Original cause of wound was Not Known. The date acquired was: 12/18/2021. The wound is located on the Left,Distal Knee. The wound measures 0.6cm length x 0.7cm width x 0.1cm depth; 0.33cm^2 area and 0.033cm^3 volume. There is Fat Layer (Subcutaneous Tissue) exposed. There is a medium amount of serosanguineous drainage noted. There is no granulation within the wound bed. There is a large (67-100%) amount of necrotic tissue within the wound bed including Adherent Slough. 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 - Dr. Dellia Nims!!! Other: - MED TECHS TO CHANGE DRESSINGS TO ALL 4 WOUNDS DAILY. Bathing/ Shower/ Hygiene: May shower with protection but do not get wound dressing(s) wet. Edema Control - Lymphedema / SCD / Other: Elevate legs to the level of the heart or above for 30 minutes daily and/or when sitting, a frequency of: Avoid standing for long periods of time. WOUND #1: - Knee Wound Laterality: Left Cleanser: Soap and Water 1 x Per GBE/01 Days Discharge Instructions: May shower and wash wound with dial antibacterial soap and water prior to dressing change. Cleanser: Wound Cleanser (Generic) 1 x Per Day/15 Days Discharge Instructions: Cleanse the wound with wound cleanser prior to applying a  clean dressing using gauze sponges, not tissue or cotton balls. Prim Dressing: Santyl Ointment 1 x Per Day/15 Days ary Discharge Instructions: Apply nickel thick  amount to wound bed as instructed Secondary Dressing: Bordered Gauze, 4x4 in 1 x Per Day/15 Days Discharge Instructions: Apply over primary dressing as directed. WOUND #2: - Knee Wound Laterality: Right Cleanser: Soap and Water 1 x Per YWV/37 Days Discharge Instructions: May shower and wash wound with dial antibacterial soap and water prior to dressing change. Cleanser: Wound Cleanser (Generic) 1 x Per Day/15 Days Discharge Instructions: Cleanse the wound with wound cleanser prior to applying a clean dressing using gauze sponges, not tissue or cotton balls. Prim Dressing: Santyl Ointment 1 x Per Day/15 Days ary Discharge Instructions: Apply nickel thick amount to wound bed as instructed Secondary Dressing: Bordered Gauze, 4x4 in 1 x Per Day/15 Days Discharge Instructions: Apply over primary dressing as directed. WOUND #3: - Knee Wound Laterality: Right, Distal Cleanser: Soap and Water 1 x Per TGG/26 Days Discharge Instructions: May shower and wash wound with dial antibacterial soap and water prior to dressing change. Cleanser: Wound Cleanser (Generic) 1 x Per Day/15 Days Discharge Instructions: Cleanse the wound with wound cleanser prior to applying a clean dressing using gauze sponges, not tissue or cotton balls. Prim Dressing: Santyl Ointment 1 x Per Day/15 Days ary Discharge Instructions: Apply nickel thick amount to wound bed as instructed Secondary Dressing: Bordered Gauze, 4x4 in 1 x Per Day/15 Days Discharge Instructions: Apply over primary dressing as directed. WOUND #4: - Knee Wound Laterality: Left, Distal Cleanser: Soap and Water 1 x Per RSW/54 Days Discharge Instructions: May shower and wash wound with dial antibacterial soap and water prior to dressing change. Cleanser: Wound Cleanser (Generic) 1 x Per Day/15 Days Discharge Instructions: Cleanse the wound with wound cleanser prior to applying a clean dressing using gauze sponges, not tissue or cotton balls. Prim Dressing:  Santyl Ointment 1 x Per Day/15 Days ary Discharge Instructions: Apply nickel thick amount to wound bed as instructed Secondary Dressing: Bordered Gauze, 4x4 in 1 x Per Day/15 Days Discharge Instructions: Apply over primary dressing as directed. 1. Santyl change daily. We will attempt to contact the facility to clarify 2. We still do not have a booking for the arterial studies 3. He is still using his prosthesis use that to come up to the clinic today Electronic Signature(s) Signed: 12/18/2021 4:29:35 PM By: Linton Ham MD Entered By: Linton Ham on 12/18/2021 16:22:37 -------------------------------------------------------------------------------- SuperBill Details Patient Name: Date of Service: [name], [name] 12/18/2021 Medical Record Number: [id number] Patient Account Number: [id number] Date of Birth/Sex: Treating RN: 02-May-1956 (66 y.o. [name], [name] Primary Care Provider: PCP, NO Other Clinician: Referring Provider: Treating Provider/Extender: Cheree Ditto in Treatment: 0 Diagnosis Coding ICD-10 Codes Code Description E11.51 Type 2 diabetes mellitus with diabetic peripheral angiopathy without gangrene L97.818 Non-pressure chronic ulcer of other part of right lower leg with other specified severity L97.828 Non-pressure chronic ulcer of other part of left lower leg with other specified severity Facility Procedures CPT4 Code: [id number] Description: 71696 - WOUND CARE VISIT-LEV 5 EST PT Modifier: Quantity: 1 Physician Procedures : CPT4 Code Description Modifier [id number] - WC PHYS LEVEL 3 - EST PT ICD-10 Diagnosis Description L97.818 Non-pressure chronic ulcer of other part of right lower leg with other specified severity L97.828 Non-pressure chronic ulcer of other part of  left lower leg with other specified severity E11.51 Type 2 diabetes mellitus with diabetic peripheral angiopathy without gangrene  Quantity: 1 Electronic Signature(s) Signed:  12/18/2021 4:29:35 PM By: Linton Ham MD Entered By: Linton Ham on 12/18/2021 16:23:05

## 2022-01-18 ENCOUNTER — Encounter (HOSPITAL_COMMUNITY): Payer: Medicare (Managed Care)

## 2022-01-18 ENCOUNTER — Encounter: Payer: Medicare (Managed Care) | Admitting: Vascular Surgery

## 2022-01-23 ENCOUNTER — Other Ambulatory Visit: Payer: Self-pay

## 2022-01-23 ENCOUNTER — Encounter (HOSPITAL_BASED_OUTPATIENT_CLINIC_OR_DEPARTMENT_OTHER): Payer: Medicare (Managed Care) | Admitting: Internal Medicine

## 2022-01-23 DIAGNOSIS — E11622 Type 2 diabetes mellitus with other skin ulcer: Secondary | ICD-10-CM | POA: Diagnosis not present

## 2022-01-24 NOTE — Progress Notes (Signed)
Jose Robinson ([id number]) Visit Report for 01/23/2022 Arrival Information Details Patient Name: Date of Service: Jose Robinson 01/23/2022 9:30 A M Medical Record Number: [id number] Patient Account Number: [id number] Date of Birth/Sex: Treating RN: [birth year] (66 y.o. Janyth Contes Primary Care : PCP, NO Other Clinician: Referring : Treating /Extender: Cheree Ditto in Treatment: 6 Visit Information History Since Last Visit Added or deleted any medications: No Patient Arrived: Wheel Chair Any new allergies or adverse reactions: No Arrival Time: 09:29 Had a fall or experienced change in No Accompanied By: alone activities of daily living that may affect Transfer Assistance: Manual risk of falls: Patient Identification Verified: Yes Signs or symptoms of abuse/neglect since last visito No Secondary Verification Process Completed: Yes Hospitalized since last visit: No Patient Requires Transmission-Based Precautions: No Implantable device outside of the clinic excluding No Patient Has Alerts: No cellular tissue based products placed in the center since last visit: Has Dressing in Place as Prescribed: Yes Pain Present Now: Yes Electronic Signature(s) Signed: 01/24/2022 6:13:38 PM By: Levan Hurst RN, BSN Entered By: Levan Hurst on 01/23/2022 09:32:41 -------------------------------------------------------------------------------- Encounter Discharge Information Details Patient Name: Date of Service: Jose Robinson, Jose Robinson. 01/23/2022 9:30 A M Medical Record Number: [id number] Patient Account Number: [id number] Date of Birth/Sex: Treating RN: October 13, 1956 (66 y.o. Janyth Contes Primary Care : PCP, NO Other Clinician: Referring : Treating /Extender: Cheree Ditto in Treatment: 6 Encounter Discharge Information Items Post Procedure Vitals Discharge Condition: Stable Temperature (F): 98.6 Ambulatory Status:  Wheelchair Pulse (bpm): 85 Discharge Destination: Home Respiratory Rate (breaths/min): 18 Transportation: Private Auto Blood Pressure (mmHg): 93/57 Accompanied By: alone Schedule Follow-up Appointment: Yes Clinical Summary of Care: Patient Declined Electronic Signature(s) Signed: 01/24/2022 6:13:38 PM By: Levan Hurst RN, BSN Entered By: Levan Hurst on 01/24/2022 10:20:38 -------------------------------------------------------------------------------- Multi Wound Chart Details Patient Name: Date of Service: Jose Robinson, Jose Robinson. 01/23/2022 9:30 A M Medical Record Number: [id number] Patient Account Number: [id number] Date of Birth/Sex: Treating RN: 02-21-1956 (66 y.o. [name], [name] Primary Care : PCP, NO Other Clinician: Referring : Treating /Extender: Cheree Ditto in Treatment: 6 Vital Signs Height(in): 71 Capillary Blood Glucose(mg/dl): 78 Weight(lbs): 144 Pulse(bpm): 79 Body Mass Index(BMI): 20.1 Blood Pressure(mmHg): 93/57 Temperature(F): 98.6 Respiratory Rate(breaths/min): 18 Photos: Left Knee Right Knee Right, Distal Knee Wound Location: Gradually Appeared Gradually Appeared Gradually Appeared Wounding Event: Diabetic Wound/Ulcer of the Lower Diabetic Wound/Ulcer of the Lower Diabetic Wound/Ulcer of the Lower Primary Etiology: Extremity Extremity Extremity Congestive Heart Failure, Congestive Heart Failure, Congestive Heart Failure, Comorbid History: Hypertension, Type II Diabetes, End Hypertension, Type II Diabetes, End Hypertension, Type II Diabetes, End Stage Renal Disease, Neuropathy Stage Renal Disease, Neuropathy Stage Renal Disease, Neuropathy 11/12/2021 11/12/2021 11/12/2021 Date Acquired: _0 Weeks of Treatment: Open Open Open Wound Status: No No No Wound Recurrence: Yes No No Clustered Wound: 2 N/A N/A Clustered Quantity: 2.5x1x0.2 0.8x0.9x0.1 2x1.8x0.1 Measurements L x W x D (cm) 1.963 0.565  2.827 A (cm) : rea 0.393 0.057 0.283 Volume (cm) : 24.90% -46.80% 10.00% % Reduction in A rea: -50.00% 26.00% 54.90% % Reduction in Volume: Grade 2 Grade 2 Grade 2 Classification: Medium Medium Medium Exudate A mount: Serosanguineous Serosanguineous Serosanguineous Exudate Type: red, brown red, brown red, brown Exudate Color: Distinct, outline attached Distinct, outline attached Distinct, outline attached Wound Margin: Small (1-33%) Large (67-100%) Large (67-100%) Granulation A mount: Pink Red, Pink Red, Pink Granulation Quality: Large (67-100%) Small (1-33%) Small (1-33%) Necrotic A mount: Eschar, Adherent  [place] Necrotic Tissue: Fat Layer (Subcutaneous Tissue): Yes Fat Layer (Subcutaneous Tissue): Yes Fat Layer (Subcutaneous Tissue): Yes Exposed Structures: Fascia: No Fascia: No Fascia: No Tendon: No Tendon: No Tendon: No Muscle: No Muscle: No Muscle: No Joint: No Joint: No Joint: No Bone: No Bone: No Bone: No None Small (1-33%) Small (1-33%) Epithelialization: Debridement - Selective/Open Wound N/A N/A Debridement: Pre-procedure Verification/Time Out 10:21 N/A N/A Taken: Necrotic/Eschar N/A N/A Tissue Debrided: Non-Viable Tissue N/A N/A Level: 2.5 N/A N/A Debridement A (sq cm): rea Curette N/A N/A Instrument: Minimum N/A N/A Bleeding: Pressure N/A N/A Hemostasis A chieved: 0 N/A N/A Procedural Pain: 0 N/A N/A Post Procedural Pain: Procedure was tolerated well N/A N/A Debridement Treatment Response: 2.5x1x0.2 N/A N/A Post Debridement Measurements L x W x D (cm) 0.393 N/A N/A Post Debridement Volume: (cm) Debridement N/A N/A Procedures Performed: Wound Number: 4 N/A N/A Photos: N/A N/A Left, Distal Knee N/A N/A Wound Location: Not Known N/A N/A Wounding Event: Diabetic Wound/Ulcer of the Lower N/A N/A Primary Etiology: Extremity Congestive Heart Failure, N/A N/A Comorbid History: Hypertension,  Type II Diabetes, End Stage Renal Disease, Neuropathy 12/18/2021 N/A N/A Date A cquired: 5 N/A N/A Weeks of Treatment: Open N/A N/A Wound Status: No N/A N/A Wound Recurrence: No N/A N/A Clustered Wound: N/A N/A N/A Clustered Quantity: 0.7x0.8x0.1 N/A N/A Measurements L x W x D (cm) 0.44 N/A N/A A (cm) : rea 0.044 N/A N/A Volume (cm) : -33.30% N/A N/A % Reduction in A rea: -33.30% N/A N/A % Reduction in Volume: Grade 2 N/A N/A Classification: Medium N/A N/A Exudate A mount: Serosanguineous N/A N/A Exudate Type: red, brown N/A N/A Exudate Color: Flat and Intact N/A N/A Wound Margin: Small (1-33%) N/A N/A Granulation A mount: Pink N/A N/A Granulation Quality: Large (67-100%) N/A N/A Necrotic A mount: Eschar, Adherent Slough N/A N/A Necrotic Tissue: Fat Layer (Subcutaneous Tissue): Yes N/A N/A Exposed Structures: Fascia: No Tendon: No Muscle: No Joint: No Bone: No Small (1-33%) N/A N/A Epithelialization: N/A N/A N/A Debridement: N/A N/A N/A Tissue Debrided: N/A N/A N/A Level: N/A N/A N/A Debridement A (sq cm): rea N/A N/A N/A Instrument: N/A N/A N/A Bleeding: N/A N/A N/A Hemostasis A chieved: N/A N/A N/A Procedural Pain: N/A N/A N/A Post Procedural Pain: Debridement Treatment Response: N/A N/A N/A Post Debridement Measurements L x N/A N/A N/A W x D (cm) N/A N/A N/A Post Debridement Volume: (cm) N/A N/A N/A Procedures Performed: Treatment Notes Electronic Signature(s) Signed: 01/23/2022 4:28:28 PM By: Linton Ham MD Signed: 01/24/2022 5:37:29 PM By: Rhae Hammock RN Entered By: Linton Ham on 01/23/2022 10:32:52 -------------------------------------------------------------------------------- Multi-Disciplinary Care Plan Details Patient Name: Date of Service: Jose Robinson, Jose Robinson. 01/23/2022 9:30 A M Medical Record Number: [id number] Patient Account Number: [id number] Date of Birth/Sex: Treating RN: May 03, 1956 (66 y.o. Janyth Contes Primary Care Maricia Scotti: PCP, NO Other Clinician: Referring Shawnia Vizcarrondo: Treating Abelardo Seidner/Extender: Cheree Ditto in Treatment: 6 Multidisciplinary Care Plan reviewed with physician Active Inactive Wound/Skin Impairment Nursing Diagnoses: Impaired tissue integrity Knowledge deficit related to ulceration/compromised skin integrity Goals: Patient will have a decrease in wound volume by X% from date: (specify in notes) Date Initiated: 12/12/2021 Date Inactivated: 01/16/2022 Target Resolution Date: 01/02/2022 Goal Status: Met Patient/caregiver will verbalize understanding of skin care regimen Date Initiated: 12/12/2021 Target Resolution Date: 02/06/2022 Goal Status: Active Ulcer/skin breakdown will have a volume reduction of 30% by week 4 Date Initiated: 12/12/2021 Date Inactivated: 01/16/2022 Target Resolution Date: 01/03/2022 Unmet Reason: pressure  continues with Goal Status: Unmet prosthesis Ulcer/skin breakdown will have a volume reduction of 50% by week 8 Date Initiated: 01/16/2022 Target Resolution Date: 02/06/2022 Goal Status: Active Interventions: Assess patient/caregiver ability to obtain necessary supplies Assess patient/caregiver ability to perform ulcer/skin care regimen upon admission and as needed Assess ulceration(s) every visit Notes: Electronic Signature(s) Signed: 01/24/2022 6:13:38 PM By: Levan Hurst RN, BSN Entered By: Levan Hurst on 01/23/2022 15:53:03 -------------------------------------------------------------------------------- Pain Assessment Details Patient Name: Date of Service: Jose Robinson. 01/23/2022 9:30 A M Medical Record Number: [id number] Patient Account Number: [id number] Date of Birth/Sex: Treating RN: [birth year] (66 y.o. Janyth Contes Primary Care Paizlie Klaus: PCP, NO Other Clinician: Referring Spencer Peterkin: Treating Karalyne Nusser/Extender: Cheree Ditto in Treatment: 6 Active Problems Location of Pain Severity  and Description of Pain Patient Has Paino Yes Site Locations Rate the pain. Rate the pain. Current Pain Level: 9 Worst Pain Level: 10 Least Pain Level: 0 Character of Pain Describe the Pain: Sharp Pain Management and Medication Current Pain Management: Medication: Yes Cold Application: No Rest: No Massage: No Activity: No T.E.N.S.: No Heat Application: No Leg drop or elevation: No Is the Current Pain Management Adequate: Adequate How does your wound impact your activities of daily livingo Sleep: No Bathing: No Appetite: No Relationship With Others: No Bladder Continence: No Emotions: No Bowel Continence: No Work: No Toileting: No Drive: No Dressing: No Hobbies: No Electronic Signature(s) Signed: 01/24/2022 6:13:38 PM By: Levan Hurst RN, BSN Entered By: Levan Hurst on 01/23/2022 09:40:57 -------------------------------------------------------------------------------- Patient/Caregiver Education Details Patient Name: Date of Service: Jose Robinson 1/25/2023andnbsp9:30 A M Medical Record Number: [id number] Patient Account Number: [id number] Date of Birth/Gender: Treating RN: March 08, 1956 (66 y.o. Janyth Contes Primary Care Physician: PCP, NO Other Clinician: Referring Physician: Treating Physician/Extender: Cheree Ditto in Treatment: 6 Education Assessment Education Provided To: Patient Education Topics Provided Wound/Skin Impairment: Methods: Explain/Verbal Responses: State content correctly Electronic Signature(s) Signed: 01/24/2022 6:13:38 PM By: Levan Hurst RN, BSN Entered By: Levan Hurst on 01/23/2022 15:56:33 -------------------------------------------------------------------------------- Wound Assessment Details Patient Name: Date of Service: Jose Robinson, Jose Robinson 01/23/2022 9:30 A M Medical Record Number: [id number] Patient Account Number: [id number] Date of Birth/Sex: Treating RN: [birth year] (66 y.o. Janyth Contes Primary Care Dimitria Ketchum: PCP, NO Other Clinician: Referring Shrinika Blatz: Treating Kennis Buell/Extender: Cheree Ditto in Treatment: 6 Wound Status Wound Number: 1 Primary Diabetic Wound/Ulcer of the Lower Extremity Etiology: Wound Location: Left Knee Wound Open Wounding Event: Gradually Appeared Status: Date Acquired: 11/12/2021 Comorbid Congestive Heart Failure, Hypertension, Type II Diabetes, End Weeks Of Treatment: 6 History: Stage Renal Disease, Neuropathy Clustered Wound: Yes Photos Wound Measurements Length: (cm) 2.5 Width: (cm) 1 Depth: (cm) 0.2 Clustered Quantity: 2 Area: (cm) 1.963 Volume: (cm) 0.393 % Reduction in Area: 24.9% % Reduction in Volume: -50% Epithelialization: None Tunneling: No Undermining: No Wound Description Classification: Grade 2 Wound Margin: Distinct, outline attached Exudate Amount: Medium Exudate Type: Serosanguineous Exudate Color: red, brown Foul Odor After Cleansing: No Slough/Fibrino Yes Wound Bed Granulation Amount: Small (1-33%) Exposed Structure Granulation Quality: Pink Fascia Exposed: No Necrotic Amount: Large (67-100%) Fat Layer (Subcutaneous Tissue) Exposed: Yes Necrotic Quality: Eschar, Adherent Slough Tendon Exposed: No Muscle Exposed: No Joint Exposed: No Bone Exposed: No Treatment Notes Wound #1 (Knee) Wound Laterality: Left Cleanser Soap and Water Discharge Instruction: May shower and wash wound with dial antibacterial soap and water prior to dressing change. Wound Cleanser Discharge Instruction: Cleanse the wound with wound cleanser prior to applying a clean dressing  using gauze sponges, not tissue or cotton balls. Peri-Wound Care Topical Primary Dressing Santyl Ointment Discharge Instruction: Apply nickel thick amount to wound bed as instructed Secondary Dressing Woven Gauze Sponge, Non-Sterile 4x4 in Discharge Instruction: Apply over primary dressing as directed. Bordered Gauze, 4x4  in Discharge Instruction: Apply over primary dressing as directed. Secured With Compression Wrap Compression Stockings Environmental education officer) Signed: 01/24/2022 6:13:38 PM By: Levan Hurst RN, BSN Entered By: Levan Hurst on 01/23/2022 09:55:01 -------------------------------------------------------------------------------- Wound Assessment Details Patient Name: Date of Service: Jose Robinson, Jose Robinson 01/23/2022 9:30 A M Medical Record Number: [id number] Patient Account Number: [id number] Date of Birth/Sex: Treating RN: 12/30/1956 (65 y.o. Janyth Contes Primary Care : PCP, NO Other Clinician: Referring : Treating /Extender: Cheree Ditto in Treatment: 6 Wound Status Wound Number: 2 Primary Diabetic Wound/Ulcer of the Lower Extremity Etiology: Wound Location: Right Knee Wound Open Wounding Event: Gradually Appeared Status: Date Acquired: 11/12/2021 Comorbid Congestive Heart Failure, Hypertension, Type II Diabetes, End Weeks Of Treatment: 6 History: Stage Renal Disease, Neuropathy Clustered Wound: No Photos Wound Measurements Length: (cm) 0.8 Width: (cm) 0.9 Depth: (cm) 0.1 Area: (cm) 0.565 Volume: (cm) 0.057 % Reduction in Area: -46.8% % Reduction in Volume: 26% Epithelialization: Small (1-33%) Tunneling: No Undermining: No Wound Description Classification: Grade 2 Wound Margin: Distinct, outline attached Exudate Amount: Medium Exudate Type: Serosanguineous Exudate Color: red, brown Foul Odor After Cleansing: No Slough/Fibrino Yes Wound Bed Granulation Amount: Large (67-100%) Exposed Structure Granulation Quality: Red, Pink Fascia Exposed: No Necrotic Amount: Small (1-33%) Fat Layer (Subcutaneous Tissue) Exposed: Yes Necrotic Quality: Adherent Slough Tendon Exposed: No Muscle Exposed: No Joint Exposed: No Bone Exposed: No Treatment Notes Wound #2 (Knee) Wound Laterality: Right Cleanser Soap and  Water Discharge Instruction: May shower and wash wound with dial antibacterial soap and water prior to dressing change. Wound Cleanser Discharge Instruction: Cleanse the wound with wound cleanser prior to applying a clean dressing using gauze sponges, not tissue or cotton balls. Peri-Wound Care Topical Primary Dressing Santyl Ointment Discharge Instruction: Apply nickel thick amount to wound bed as instructed Secondary Dressing Woven Gauze Sponge, Non-Sterile 4x4 in Discharge Instruction: Apply over primary dressing as directed. Bordered Gauze, 4x4 in Discharge Instruction: Apply over primary dressing as directed. Secured With Compression Wrap Compression Stockings Environmental education officer) Signed: 01/24/2022 6:13:38 PM By: Levan Hurst RN, BSN Entered By: Levan Hurst on 01/23/2022 09:55:31 -------------------------------------------------------------------------------- Wound Assessment Details Patient Name: Date of Service: Jose Robinson, Jose Robinson 01/23/2022 9:30 A M Medical Record Number: [id number] Patient Account Number: [id number] Date of Birth/Sex: Treating RN: 07-20-1956 (66 y.o. Janyth Contes Primary Care : PCP, NO Other Clinician: Referring : Treating /Extender: Cheree Ditto in Treatment: 6 Wound Status Wound Number: 3 Primary Diabetic Wound/Ulcer of the Lower Extremity Etiology: Wound Location: Right, Distal Knee Wound Open Wounding Event: Gradually Appeared Status: Date Acquired: 11/12/2021 Comorbid Congestive Heart Failure, Hypertension, Type II Diabetes, End Weeks Of Treatment: 6 History: Stage Renal Disease, Neuropathy Clustered Wound: No Photos Wound Measurements Length: (cm) 2 Width: (cm) 1.8 Depth: (cm) 0.1 Area: (cm) 2.827 Volume: (cm) 0.283 % Reduction in Area: 10% % Reduction in Volume: 54.9% Epithelialization: Small (1-33%) Tunneling: No Undermining: No Wound Description Classification: Grade  2 Wound Margin: Distinct, outline attached Exudate Amount: Medium Exudate Type: Serosanguineous Exudate Color: red, brown Foul Odor After Cleansing: No Slough/Fibrino Yes Wound Bed Granulation Amount: Large (67-100%) Exposed Structure Granulation Quality: Red, Pink Fascia Exposed: No Necrotic Amount: Small (1-33%) Fat Layer (Subcutaneous Tissue) Exposed:  Yes Necrotic Quality: Adherent Slough Tendon Exposed: No Muscle Exposed: No Joint Exposed: No Bone Exposed: No Treatment Notes Wound #3 (Knee) Wound Laterality: Right, Distal Cleanser Soap and Water Discharge Instruction: May shower and wash wound with dial antibacterial soap and water prior to dressing change. Wound Cleanser Discharge Instruction: Cleanse the wound with wound cleanser prior to applying a clean dressing using gauze sponges, not tissue or cotton balls. Peri-Wound Care Topical Primary Dressing Santyl Ointment Discharge Instruction: Apply nickel thick amount to wound bed as instructed Secondary Dressing Woven Gauze Sponge, Non-Sterile 4x4 in Discharge Instruction: Apply over primary dressing as directed. Bordered Gauze, 4x4 in Discharge Instruction: Apply over primary dressing as directed. Secured With Compression Wrap Compression Stockings Environmental education officer) Signed: 01/24/2022 6:13:38 PM By: Levan Hurst RN, BSN Entered By: Levan Hurst on 01/23/2022 09:56:04 -------------------------------------------------------------------------------- Wound Assessment Details Patient Name: Date of Service: Jose Robinson, Jose Robinson 01/23/2022 9:30 A M Medical Record Number: [id number] Patient Account Number: [id number] Date of Birth/Sex: Treating RN: Jun 11, 1956 (66 y.o. Janyth Contes Primary Care Tel Hevia: PCP, NO Other Clinician: Referring Marchello Rothgeb: Treating Jerico Grisso/Extender: Cheree Ditto in Treatment: 6 Wound Status Wound Number: 4 Primary Diabetic Wound/Ulcer of the Lower  Extremity Etiology: Wound Location: Left, Distal Knee Wound Open Wounding Event: Not Known Status: Date Acquired: 12/18/2021 Comorbid Congestive Heart Failure, Hypertension, Type II Diabetes, End Weeks Of Treatment: 5 History: Stage Renal Disease, Neuropathy Clustered Wound: No Photos Wound Measurements Length: (cm) 0.7 Width: (cm) 0.8 Depth: (cm) 0.1 Area: (cm) 0.44 Volume: (cm) 0.044 % Reduction in Area: -33.3% % Reduction in Volume: -33.3% Epithelialization: Small (1-33%) Tunneling: No Undermining: No Wound Description Classification: Grade 2 Wound Margin: Flat and Intact Exudate Amount: Medium Exudate Type: Serosanguineous Exudate Color: red, brown Foul Odor After Cleansing: No Slough/Fibrino Yes Wound Bed Granulation Amount: Small (1-33%) Exposed Structure Granulation Quality: Pink Fascia Exposed: No Necrotic Amount: Large (67-100%) Fat Layer (Subcutaneous Tissue) Exposed: Yes Necrotic Quality: Eschar, Adherent Slough Tendon Exposed: No Muscle Exposed: No Joint Exposed: No Bone Exposed: No Treatment Notes Wound #4 (Knee) Wound Laterality: Left, Distal Cleanser Soap and Water Discharge Instruction: May shower and wash wound with dial antibacterial soap and water prior to dressing change. Wound Cleanser Discharge Instruction: Cleanse the wound with wound cleanser prior to applying a clean dressing using gauze sponges, not tissue or cotton balls. Peri-Wound Care Topical Primary Dressing Santyl Ointment Discharge Instruction: Apply nickel thick amount to wound bed as instructed Secondary Dressing Woven Gauze Sponge, Non-Sterile 4x4 in Discharge Instruction: Apply over primary dressing as directed. Bordered Gauze, 4x4 in Discharge Instruction: Apply over primary dressing as directed. Secured With Compression Wrap Compression Stockings Environmental education officer) Signed: 01/24/2022 6:13:38 PM By: Levan Hurst RN, BSN Entered By: Levan Hurst on  01/23/2022 09:56:38 -------------------------------------------------------------------------------- [place] Details Patient Name: Date of Service: Jose Robinson, Jose Minion T Jenetta Downer. 01/23/2022 9:30 A M Medical Record Number: [id number] Patient Account Number: [id number] Date of Birth/Sex: Treating RN: 05/27/1956 (66 y.o. Janyth Contes Primary Care Gil Ingwersen: PCP, NO Other Clinician: Referring Abbie Berling: Treating Hughey Rittenberry/Extender: Cheree Ditto in Treatment: 6 Vital Signs Time Taken: 09:29 Temperature (F): 98.6 Height (in): 71 Pulse (bpm): 85 Weight (lbs): 144 Respiratory Rate (breaths/min): 18 Body Mass Index (BMI): 20.1 Blood Pressure (mmHg): 93/57 Capillary Blood Glucose (mg/dl): 78 Reference Range: 80 - 120 mg / dl Notes glucose per pt report this AM Electronic Signature(s) Signed: 01/24/2022 6:13:38 PM By: Levan Hurst RN, BSN Entered By: Levan Hurst on 01/23/2022 09:40:19

## 2022-01-29 NOTE — Progress Notes (Signed)
[name], SITES ([id number]) Visit Report for 01/23/2022 Debridement Details Patient Name: Date of Service: Jose Robinson 01/23/2022 9:30 A M Medical Record Number: [id number] Patient Account Number: [id number] Date of Birth/Sex: Treating RN: 11/21/1956 (66 y.o. [name], [name] Primary Care Provider: PCP, NO Other Clinician: Referring Provider: Treating Provider/Extender: Cheree Ditto in Treatment: 6 Debridement Performed for Assessment: Wound #1 Left Knee Performed By: Physician Ricard Dillon., MD Debridement Type: Debridement Severity of Tissue Pre Debridement: Fat layer exposed Level of Consciousness (Pre-procedure): Awake and Alert Pre-procedure Verification/Time Out Yes - 10:21 Taken: Start Time: 10:21 T Area Debrided (L x W): otal 2.5 (cm) x 1 (cm) = 2.5 (cm) Tissue and other material debrided: Non-Viable, Eschar, Subcutaneous Level: Skin/Subcutaneous Tissue Debridement Description: Excisional Instrument: Curette Bleeding: Minimum Hemostasis Achieved: Pressure End Time: 10:22 Procedural Pain: 0 Post Procedural Pain: 0 Response to Treatment: Procedure was tolerated well Level of Consciousness (Post- Awake and Alert procedure): Post Debridement Measurements of Total Wound Length: (cm) 2.5 Width: (cm) 1 Depth: (cm) 0.2 Volume: (cm) 0.393 Character of Wound/Ulcer Post Debridement: Requires Further Debridement Severity of Tissue Post Debridement: Fat layer exposed Post Procedure Diagnosis Same as Pre-procedure Electronic Signature(s) Signed: 01/23/2022 4:28:28 PM By: Linton Ham MD Signed: 01/24/2022 5:37:29 PM By: Rhae Hammock RN Entered By: Linton Ham on 01/23/2022 10:33:17 -------------------------------------------------------------------------------- HPI Details Patient Name: Date of Service: Jose Robinson, Jose Robinson. 01/23/2022 9:30 A M Medical Record Number: [id number] Patient Account Number: [id number] Date of Birth/Sex: Treating  RN: 20-Dec-1956 (66 y.o. Erie Noe Primary Care Provider: PCP, NO Other Clinician: Referring Provider: Treating Provider/Extender: Cheree Ditto in Treatment: 6 History of Present Illness HPI Description: ADMISSION 12/12/2021 This is a 66 year old man who is a type II diabetic likely with multiple complications. He is self-referred for wounds on his right patella and right posterior patellar tendon also on the left patella. 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 He is a type II diabetic. The patient states the wounds were there for the last 3 to 4 weeks a simply broke open. He complains of pain in the right leg. He was in the ER on October 26 x-rays of the left knee were done that did not show any abnormalities. He was diagnosed with prepatellar bursitis. 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 fib. He was at a wound care center in [facility] I believe in 2016 with a wound on his left leg. Notable that he had a right BKA at [facility]. Presumably he had arterial studies but I could not put my eyes on these today ABI in the left 12/20; comes in today with a new wound on the lower left patella. It seems according the patient there was confusion about who is to apply the Santyl even though the orders from our point of view were fairly clear. He is in a very light care assisted living We do not have a booking for his arterial studies which will include ABI TBI and arterial Dopplers on the left leg and on the BKA side on the right simply arterial Dopplers 1/18; the patient is using Santyl and gauze and doing this himself in the assisted  living. Initially had some concern  about this however he appears to be doing a good job in the surface of the wounds on his bilateral knee areas actually looks better. He has his arterial studies on the left leg on 1/20. He does not have a wound on the distal left leg or foot Partway through our visit he did talk about a wound on his buttocks although we did not look at this today unfortunately 1/25; he came in early this week because of pain around the wound on the right anterior infrapatellar area. He has areas over the left patella as well. Once again he comes in in his prosthesis claiming he needs it to transfer Also he missed his appointment with vein and vascular with regard to arterial studies this has not been rebooked. He says this was because of transportation issues Electronic Signature(s) Signed: 01/23/2022 4:28:28 PM By: Linton Ham MD Entered By: Linton Ham on 01/23/2022 10:35:38 -------------------------------------------------------------------------------- Physical Exam Details Patient Name: Date of Service: Jose Robinson. 01/23/2022 9:30 A M Medical Record Number: [id number] Patient Account Number: [id number] Date of Birth/Sex: Treating RN: 03/24/1956 (66 y.o. Erie Noe Primary Care Provider: PCP, NO Other Clinician: Referring Provider: Treating Provider/Extender: Cheree Ditto in Treatment: 6 Constitutional Patient is hypotensive.However he appears well he appears well. Pulse regular and within target range for patient.Marland Kitchen Respirations regular, non-labored and within target range.. Temperature is normal and within the target range for the patient.Marland Kitchen Appears in no distress. Cardiovascular . Notes Wound exam; superficial wound on the right infrapatellar tendon the right patella and on the left on the patellar area. The infrapatellar area on the right actually has exposed muscle I was able to demonstrate that today and probably tendon above this. Nevertheless the surface is a lot  cleaner here. Around the wound there appears to be some erythema and some tenderness likely cellulitis On the left still thick eschar and subcutaneous debris I removed with a #3 curette these wounds are smaller. I can feel a popliteal pulse on the right but not the left Electronic Signature(s) Signed: 01/23/2022 4:28:28 PM By: Linton Ham MD Entered By: Linton Ham on 01/23/2022 10:37:28 -------------------------------------------------------------------------------- Physician Orders Details Patient Name: Date of Service: Jose Robinson, Jose Robinson. 01/23/2022 9:30 A M Medical Record Number: [id number] Patient Account Number: [id number] Date of Birth/Sex: Treating RN: Feb 23, 1956 (66 y.o. Janyth Contes Primary Care Provider: PCP, NO Other Clinician: Referring Provider: Treating Provider/Extender: Cheree Ditto in Treatment: 6 Verbal / Phone Orders: No Diagnosis Coding ICD-10 Coding Code Description E11.51 Type 2 diabetes mellitus with diabetic peripheral angiopathy without gangrene L97.818 Non-pressure chronic ulcer of other part of right lower leg with other specified severity L97.828 Non-pressure chronic ulcer of other part of left lower leg with other specified severity Follow-up Appointments ppointment in 1 week. - Dr. Dellia Nims Return A Bathing/ Shower/ Hygiene May shower with protection but do not get wound dressing(s) wet. Edema Control - Lymphedema / SCD / Other Elevate legs to the level of the heart or above for 30 minutes daily and/or when sitting, a frequency of: Avoid standing for long periods of time. Wound Treatment Wound #1 - Knee Wound Laterality: Left Cleanser: Soap and Water 1 x Per NWG/95 Days Discharge Instructions: May shower and wash wound with dial antibacterial soap and water prior to dressing change. Cleanser: Wound Cleanser (Generic) 1 x Per Day/15 Days Discharge Instructions: Cleanse the wound with wound cleanser prior to applying a clean  dressing using  gauze sponges, not tissue or cotton balls. Prim Dressing: Santyl Ointment 1 x Per Day/15 Days ary Discharge Instructions: Apply nickel thick amount to wound bed as instructed Secondary Dressing: Woven Gauze Sponge, Non-Sterile 4x4 in (Generic) 1 x Per Day/15 Days Discharge Instructions: Apply over primary dressing as directed. Secondary Dressing: Bordered Gauze, 4x4 in (DME) (Generic) 1 x Per Day/15 Days Discharge Instructions: Apply over primary dressing as directed. Wound #2 - Knee Wound Laterality: Right Cleanser: Soap and Water 1 x Per JAS/50 Days Discharge Instructions: May shower and wash wound with dial antibacterial soap and water prior to dressing change. Cleanser: Wound Cleanser (Generic) 1 x Per Day/15 Days Discharge Instructions: Cleanse the wound with wound cleanser prior to applying a clean dressing using gauze sponges, not tissue or cotton balls. Prim Dressing: Santyl Ointment 1 x Per Day/15 Days ary Discharge Instructions: Apply nickel thick amount to wound bed as instructed Secondary Dressing: Woven Gauze Sponge, Non-Sterile 4x4 in (Generic) 1 x Per Day/15 Days Discharge Instructions: Apply over primary dressing as directed. Secondary Dressing: Bordered Gauze, 4x4 in (Generic) 1 x Per Day/15 Days Discharge Instructions: Apply over primary dressing as directed. Wound #3 - Knee Wound Laterality: Right, Distal Cleanser: Soap and Water 1 x Per NLZ/76 Days Discharge Instructions: May shower and wash wound with dial antibacterial soap and water prior to dressing change. Cleanser: Wound Cleanser (Generic) 1 x Per Day/15 Days Discharge Instructions: Cleanse the wound with wound cleanser prior to applying a clean dressing using gauze sponges, not tissue or cotton balls. Prim Dressing: Santyl Ointment ary 1 x Per Day/15 Days Discharge Instructions: Apply nickel thick amount to wound bed as instructed Secondary Dressing: Woven Gauze Sponge, Non-Sterile 4x4 in  (Generic) 1 x Per Day/15 Days Discharge Instructions: Apply over primary dressing as directed. Secondary Dressing: Bordered Gauze, 4x4 in (DME) (Generic) 1 x Per Day/15 Days Discharge Instructions: Apply over primary dressing as directed. Wound #4 - Knee Wound Laterality: Left, Distal Cleanser: Soap and Water 1 x Per BHA/19 Days Discharge Instructions: May shower and wash wound with dial antibacterial soap and water prior to dressing change. Cleanser: Wound Cleanser (Generic) 1 x Per Day/15 Days Discharge Instructions: Cleanse the wound with wound cleanser prior to applying a clean dressing using gauze sponges, not tissue or cotton balls. Prim Dressing: Santyl Ointment 1 x Per Day/15 Days ary Discharge Instructions: Apply nickel thick amount to wound bed as instructed Secondary Dressing: Woven Gauze Sponge, Non-Sterile 4x4 in (Generic) 1 x Per Day/15 Days Discharge Instructions: Apply over primary dressing as directed. Secondary Dressing: Bordered Gauze, 4x4 in (DME) (Generic) 1 x Per Day/15 Days Discharge Instructions: Apply over primary dressing as directed. Patient Medications llergies: Iodinated Contrast Media A Notifications Medication Indication Start End 01/23/2022 doxycycline hyclate DOSE 1 - oral 100 mg tablet - 1 tablet oral 2 times a day x 7 days 01/23/2022 Santyl DOSE 1 - topical 250 unit/gram ointment - 1 ointment topical daily Electronic Signature(s) Signed: 01/24/2022 6:13:38 PM By: Levan Hurst RN, BSN Signed: 01/29/2022 4:28:46 PM By: Linton Ham MD Previous Signature: 01/24/2022 6:04:31 PM Version By: Linton Ham MD Previous Signature: 01/23/2022 4:28:28 PM Version By: Linton Ham MD Entered By: Levan Hurst on 01/24/2022 18:12:04 Prescription 01/23/2022 -------------------------------------------------------------------------------- Berenda Morale MD Patient Name: Provider: Feb 26, 1956 [id number] Date of Birth: NPI#: Jerilynn Mages  [id number] Sex: DEA #: [phone] Phone #: License #: [place] Patient Address: [address] [place], [place] [address] [address] [place], [place] [address] [phone] Allergies Iodinated Contrast Media Medication Medication: Route: Strength: Form: doxycycline hyclate oral 100 mg tablet Class: PERIODONTAL COLLAGENASE INHIBITORS Dose: Frequency / Time: Indication: 1 1 tablet oral 2 times a day x 7 days Number of Refills: Number of Units: 0 Generic Substitution: Start Date: End Date: Administered at Facility: Substitution Permitted 8/36/6294 No Note to Pharmacy: Hand Signature: Date(s): Prescription 01/23/2022 Trageser, Lance Coon MD Patient Name: Provider: 01-03-1956 [id number] Date of Birth: NPI#: Jerilynn Mages [id number] Sex: DEA #: [phone] Phone #: License #: [place] Patient Address: [address], [place] [address] [place],  [address] [phone] Allergies Iodinated Contrast Media Medication Medication: Route: Strength: Form: Santyl topical 250 unit/gram ointment Class: TOPICAL/MUCOUS MEMBR./SUBCUT ENZYMES . Dose: Frequency / Time: Indication: 1 1 ointment topical daily Number of Refills: Number of Units: 0 Thirty (30) Gram(s) Generic Substitution: Start Date: End Date: Administered at Facility: Substitution Permitted 0/92/3300 No Note to Pharmacy: Hand Signature: Date(s): Electronic Signature(s) Signed: 01/24/2022 6:13:38 PM By: Levan Hurst RN, BSN Signed: 01/29/2022 4:28:46 PM By: Linton Ham MD Entered By: Levan Hurst on 01/24/2022 18:12:05 -------------------------------------------------------------------------------- Problem List Details Patient Name: Date of Service: Jose Robinson, Jose Robinson. 01/23/2022 9:30 A M Medical Record Number: [id number] Patient Account Number:  [id number] Date of Birth/Sex: Treating RN: 12/21/1956 (66 y.o. Janyth Contes Primary Care Provider: PCP, NO Other Clinician: Referring Provider: Treating Provider/Extender: Cheree Ditto in Treatment: 6 Active Problems ICD-10 Encounter Code Description Active Date MDM Diagnosis E11.51 Type 2 diabetes mellitus with diabetic peripheral angiopathy without gangrene 12/12/2021 No Yes L97.828 Non-pressure chronic ulcer of other part of left lower leg with other specified 12/12/2021 No Yes severity L97.818 Non-pressure chronic ulcer of other part of right lower leg with other specified 12/12/2021 No Yes severity L03.115 Cellulitis of right lower limb 01/23/2022 No Yes Inactive Problems Resolved Problems Electronic Signature(s) Signed: 01/23/2022 4:28:28 PM By: Linton Ham MD Entered By: Linton Ham on 01/23/2022 10:32:37 -------------------------------------------------------------------------------- Progress Note Details Patient Name: Date of Service: Jose Robinson, Jose Robinson. 01/23/2022 9:30 A M Medical Record Number: [id number] Patient Account Number: [id number] Date of Birth/Sex: Treating RN: 1956/12/21 (66 y.o. Erie Noe Primary Care Provider: PCP, NO Other Clinician: Referring Provider: Treating Provider/Extender: Cheree Ditto in Treatment: 6 Subjective History of Present Illness (HPI) ADMISSION 12/12/2021 This is a 66 year old man who is a type II diabetic likely with multiple complications. He is self-referred for wounds on his right patella and right posterior patellar tendon also on the left patella. 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 He is a type II diabetic. The patient states the wounds were there for the last 3 to 4 weeks a simply broke open. He complains of pain in the right leg. He was in the ER on October 26 x-rays of the left knee were done that did not show any abnormalities. He was diagnosed with  prepatellar bursitis. 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 fib. He was at a wound care center in [facility] I believe in 2016 with a wound on his left leg. Notable  that he had a right BKA at [facility]. Presumably he had arterial studies but I could not put my eyes on these today ABI in the left 12/20; comes in today with a new wound on the lower left patella. It seems according the patient there was confusion about who is to apply the Santyl even though the orders from our point of view were fairly clear. He is in a very light care assisted living We do not have a booking for his arterial studies which will include ABI TBI and arterial Dopplers on the left leg and on the BKA side on the right simply arterial Dopplers 1/18; the patient is using Santyl and gauze and doing this himself in the assisted living. Initially had some concern about this however he appears to be doing a good job in the surface of the wounds on his bilateral knee areas actually looks better. He has his arterial studies on the left leg on 1/20. He does not have a wound on the distal left leg or foot Partway through our visit he did talk about a wound on his buttocks although we did not look at this today unfortunately 1/25; he came in early this week because of pain around the wound on the right anterior infrapatellar area. He has areas over the left patella as well. Once again he comes in in his prosthesis claiming he needs it to transfer Also he missed his appointment with vein and vascular with regard to arterial studies this has not been rebooked. He says this was because of transportation issues Objective Constitutional Patient is  hypotensive.However he appears well he appears well. Pulse regular and within target range for patient.Marland Kitchen Respirations regular, non-labored and within target range.. Temperature is normal and within the target range for the patient.Marland Kitchen Appears in no distress. Vitals Time Taken: 9:29 AM, Height: 71 in, Weight: 144 lbs, BMI: 20.1, Temperature: 98.6 F, Pulse: 85 bpm, Respiratory Rate: 18 breaths/min, Blood Pressure: 93/57 mmHg, Capillary Blood Glucose: 78 mg/dl. General Notes: glucose per pt report this AM General Notes: Wound exam; superficial wound on the right infrapatellar tendon the right patella and on the left on the patellar area. The infrapatellar area on the right actually has exposed muscle I was able to demonstrate that today and probably tendon above this. Nevertheless the surface is a lot cleaner here. Around the wound there appears to be some erythema and some tenderness likely cellulitis On the left still thick eschar and subcutaneous debris I removed with a #3 curette these wounds are smaller. I can feel a popliteal pulse on the right but not the left Integumentary (Hair, Skin) Wound #1 status is Open. Original cause of wound was Gradually Appeared. The date acquired was: 11/12/2021. The wound has been in treatment 6 weeks. The wound is located on the Left Knee. The wound measures 2.5cm length x 1cm width x 0.2cm depth; 1.963cm^2 area and 0.393cm^3 volume. There is Fat Layer (Subcutaneous Tissue) exposed. There is no tunneling or undermining noted. There is a medium amount of serosanguineous drainage noted. The wound margin is distinct with the outline attached to the wound base. There is small (1-33%) pink granulation within the wound bed. There is a large (67-100%) amount of necrotic tissue within the wound bed including Eschar and Adherent Slough. Wound #2 status is Open. Original cause of wound was Gradually Appeared. The date acquired was: 11/12/2021. The wound has been in treatment  6 weeks. The wound is located on the Right Knee. The  wound measures 0.8cm length x 0.9cm width x 0.1cm depth; 0.565cm^2 area and 0.057cm^3 volume. There is Fat Layer (Subcutaneous Tissue) exposed. There is no tunneling or undermining noted. There is a medium amount of serosanguineous drainage noted. The wound margin is distinct with the outline attached to the wound base. There is large (67-100%) red, pink granulation within the wound bed. There is a small (1-33%) amount of necrotic tissue within the wound bed including Adherent Slough. Wound #3 status is Open. Original cause of wound was Gradually Appeared. The date acquired was: 11/12/2021. The wound has been in treatment 6 weeks. The wound is located on the Right,Distal Knee. The wound measures 2cm length x 1.8cm width x 0.1cm depth; 2.827cm^2 area and 0.283cm^3 volume. There is Fat Layer (Subcutaneous Tissue) exposed. There is no tunneling or undermining noted. There is a medium amount of serosanguineous drainage noted. The wound margin is distinct with the outline attached to the wound base. There is large (67-100%) red, pink granulation within the wound bed. There is a small (1-33%) amount of necrotic tissue within the wound bed including Adherent Slough. Wound #4 status is Open. Original cause of wound was Not Known. The date acquired was: 12/18/2021. The wound has been in treatment 5 weeks. The wound is located on the Left,Distal Knee. The wound measures 0.7cm length x 0.8cm width x 0.1cm depth; 0.44cm^2 area and 0.044cm^3 volume. There is Fat Layer (Subcutaneous Tissue) exposed. There is no tunneling or undermining noted. There is a medium amount of serosanguineous drainage noted. The wound margin is flat and intact. There is small (1-33%) pink granulation within the wound bed. There is a large (67-100%) amount of necrotic tissue within the wound bed including Eschar and Adherent Slough. 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 There was a Excisional Skin/Subcutaneous Tissue Debridement with a total area of 2.5 sq cm performed by Ricard Dillon., MD. With the following instrument(s): Curette to remove Non-Viable tissue/material. Material removed includes Eschar and Subcutaneous Tissue and. No specimens were taken. A time out was conducted at 10:21, prior to the start of the procedure. A Minimum amount of bleeding was controlled with Pressure. The procedure was tolerated well with a pain level of 0 throughout and a pain level of 0 following the procedure. Post Debridement Measurements: 2.5cm length x 1cm width x 0.2cm depth; 0.393cm^3 volume. Character of Wound/Ulcer Post Debridement requires further debridement. Severity of Tissue Post Debridement is: Fat layer exposed. Post procedure Diagnosis Wound #1: Same as Pre-Procedure Plan Follow-up Appointments: Return Appointment in 1 week. - Dr. Dellia Nims Bathing/ Shower/ Hygiene: May shower with protection but do not get wound dressing(s) wet. Edema Control - Lymphedema / SCD / Other: Elevate legs to the level of the heart or above for 30 minutes daily and/or when sitting, a frequency of: Avoid standing for long periods of time. WOUND #1: - Knee Wound Laterality: Left Cleanser: Soap and Water 1 x Per KNL/97 Days Discharge Instructions: May shower and wash wound with dial antibacterial soap and water prior to dressing change. Cleanser: Wound Cleanser (Generic) 1 x Per Day/15  Days Discharge Instructions: Cleanse the  wound with wound cleanser prior to applying a clean dressing using gauze sponges, not tissue or cotton balls. Prim Dressing: Santyl Ointment 1 x Per Day/15 Days ary Discharge Instructions: Apply nickel thick amount to wound bed as instructed Secondary Dressing: Woven Gauze Sponge, Non-Sterile 4x4 in (Generic) 1 x Per Day/15 Days Discharge Instructions: Apply over primary dressing as directed. Secondary Dressing: Bordered Gauze, 4x4 in (Generic) 1 x Per Day/15 Days Discharge Instructions: Apply over primary dressing as directed. WOUND #2: - Knee Wound Laterality: Right Cleanser: Soap and Water 1 x Per DUK/02 Days Discharge Instructions: May shower and wash wound with dial antibacterial soap and water prior to dressing change. Cleanser: Wound Cleanser (Generic) 1 x Per Day/15 Days Discharge Instructions: Cleanse the wound with wound cleanser prior to applying a clean dressing using gauze sponges, not tissue or cotton balls. Prim Dressing: Santyl Ointment 1 x Per Day/15 Days ary Discharge Instructions: Apply nickel thick amount to wound bed as instructed Secondary Dressing: Woven Gauze Sponge, Non-Sterile 4x4 in (Generic) 1 x Per Day/15 Days Discharge Instructions: Apply over primary dressing as directed. Secondary Dressing: Bordered Gauze, 4x4 in (Generic) 1 x Per Day/15 Days Discharge Instructions: Apply over primary dressing as directed. WOUND #3: - Knee Wound Laterality: Right, Distal Cleanser: Soap and Water 1 x Per RKY/70 Days Discharge Instructions: May shower and wash wound with dial antibacterial soap and water prior to dressing change. Cleanser: Wound Cleanser (Generic) 1 x Per Day/15 Days Discharge Instructions: Cleanse the wound with wound cleanser prior to applying a clean dressing using gauze sponges, not tissue or cotton balls. Prim Dressing: Santyl Ointment 1 x Per Day/15 Days ary Discharge Instructions: Apply nickel thick amount to wound bed as instructed Secondary Dressing:  Woven Gauze Sponge, Non-Sterile 4x4 in (Generic) 1 x Per Day/15 Days Discharge Instructions: Apply over primary dressing as directed. Secondary Dressing: Bordered Gauze, 4x4 in (Generic) 1 x Per Day/15 Days Discharge Instructions: Apply over primary dressing as directed. WOUND #4: - Knee Wound Laterality: Left, Distal Cleanser: Soap and Water 1 x Per WCB/76 Days Discharge Instructions: May shower and wash wound with dial antibacterial soap and water prior to dressing change. Cleanser: Wound Cleanser (Generic) 1 x Per Day/15 Days Discharge Instructions: Cleanse the wound with wound cleanser prior to applying a clean dressing using gauze sponges, not tissue or cotton balls. Prim Dressing: Santyl Ointment 1 x Per Day/15 Days ary Discharge Instructions: Apply nickel thick amount to wound bed as instructed Secondary Dressing: Woven Gauze Sponge, Non-Sterile 4x4 in (Generic) 1 x Per Day/15 Days Discharge Instructions: Apply over primary dressing as directed. Secondary Dressing: Bordered Gauze, 4x4 in (Generic) 1 x Per Day/15 Days Discharge Instructions: Apply over primary dressing as directed. 1. I have continued with Santyl based dressings as I think they have the best chance of cleaning up the wound surfaces. It also has the benefit of this is something the patient can do himself. Where he is living there is no way to get dressings changed 2. Once again he is missed his vascular appointment. I am not sure what we can do to make this happen. He likely has severe PAD in the left leg 3. The wound on the right has erythema around this and some tenderness I have empirically given him doxycycline I did not see anything worth culturing. Electronic Signature(s) Signed: 01/23/2022 4:28:28 PM By: Linton Ham MD Entered By: Linton Ham on 01/23/2022 10:38:46 -------------------------------------------------------------------------------- SuperBill Details Patient Name: Date of Service:  Jose Robinson 01/23/2022 Medical Record Number: [id number] Patient Account Number: [id number] Date of Birth/Sex: Treating RN: 1956-07-05 (66 y.o. [name], [name] Primary Care Provider: PCP, NO Other Clinician: Referring Provider: Treating Provider/Extender: Cheree Ditto in Treatment: 6 Diagnosis Coding ICD-10 Codes Code Description E11.51 Type 2 diabetes mellitus with diabetic peripheral angiopathy without gangrene L97.828 Non-pressure chronic ulcer of other part of left lower leg with other specified severity L97.818 Non-pressure chronic ulcer of other part of right lower leg with other specified severity L03.115 Cellulitis of right lower limb Facility Procedures CPT4 Code: 83291916 I Description: 60600 - DEB SUBQ TISSUE 20 SQ CM/< CD-10 Diagnosis Description L97.828 Non-pressure chronic ulcer of other part of left lower leg with other specified se Modifier: verity Quantity: 1 Physician Procedures : CPT4 Code Description Modifier [id number] - WC PHYS SUBQ TISS 20 SQ CM ICD-10 Diagnosis Description L97.828 Non-pressure chronic ulcer of other part of left lower leg with other specified severity Quantity: 1 Electronic Signature(s) Signed: 01/23/2022 4:28:28 PM By: Linton Ham MD Entered By: Linton Ham on 01/23/2022 10:39:01

## 2022-01-30 ENCOUNTER — Encounter (HOSPITAL_BASED_OUTPATIENT_CLINIC_OR_DEPARTMENT_OTHER): Payer: Medicare (Managed Care) | Attending: Internal Medicine | Admitting: Internal Medicine

## 2022-01-30 ENCOUNTER — Encounter (HOSPITAL_BASED_OUTPATIENT_CLINIC_OR_DEPARTMENT_OTHER): Payer: Medicare (Managed Care) | Admitting: Internal Medicine

## 2022-01-30 NOTE — Progress Notes (Signed)
Office Note     CC:  Wound Requesting Provider:  Ricard Dillon, MD  HPI: Jose Robinson is a 66 y.o. (07-27-1956) male presenting at the request of Dr Linton Ham for left lower extremity foot wounds.  Medical history includes: End-stage renal disease on hemodialysis TTS, peripheral artery disease, chronic systolic heart failure, COPD, insulin-dependent diabetes mellitus, active tobacco use, hypotension postdialysis.  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  Patient has been getting majority of his care at outside institution such as [facility].  He recently relocated from [place] in [place] to [place] as of January 2022 to be closer to his daughter and new granddaughter.  On exam, Jose Robinson was doing well.  His major complaint was a nonhealing wound on the medial aspect of his left foot at the first metatarsal.  This has been present for several months.  Although he spends significant time in a wheelchair, he is able to stand and pivot using both his right sided prosthesis and left leg.  He would like to do everything possible to save his left lower extremity.  He denies vascular surgery history in the left leg.  The pt is  on a statin for cholesterol management.  The pt is  on a daily aspirin.   Other AC:  - The pt is  on medication for hypertension.   The pt is  diabetic.  Tobacco hx:  every day   Past Medical History:  Diagnosis Date   Diabetes mellitus without complication ([place])    ESRD on hemodialysis ([place])    Hypertension     Past Surgical History:  Procedure Laterality Date   BACK SURGERY     BELOW KNEE LEG AMPUTATION      Social History   Socioeconomic History   Marital status: Single    Spouse name: Not on file   Number of children: Not on  file   Years of education: Not on file   Highest education level: Not on file  Occupational History   Not on file  Tobacco Use   Smoking status: Every Day    Packs/day: 0.50    Types: Cigarettes   Smokeless tobacco: Never  Substance and Sexual Activity   Alcohol use: Not Currently   Drug use: Not Currently   Sexual activity: Not on file  Other Topics Concern   Not on file  Social History Narrative   Not on file   Social Determinants of Health   Financial Resource Strain: Not on file  Food Insecurity: Not on file  Transportation Needs: Not on file  Physical Activity: Not on file  Stress: Not on file  Social Connections: Not on file  Intimate Partner Violence: Not on file    Family History  Family history unknown: Yes    Current Outpatient Medications  Medication Sig Dispense Refill   alum & mag hydroxide-simeth (MAALOX/MYLANTA) 200-200-20 MG/5ML suspension Take 10 mLs by mouth 4 (four) times daily -  with meals and at bedtime. As needed for indigestion     aspirin 81 MG chewable tablet Chew 1 tablet (81 mg total) by mouth daily. 30 tablet 3   atorvastatin (LIPITOR) 20 MG tablet Take 1 tablet (20 mg total) by mouth daily. 30 tablet 0   blood glucose meter kit and supplies KIT Dispense based on patient  and insurance preference. Use up to four times daily as directed. (FOR ICD-9 250.00, 250.01). (Patient taking differently: Inject 1 each into the skin See admin instructions. Dispense based on patient and insurance preference. Use up to four times daily as directed. (FOR ICD-9 250.00, 250.01).) 1 each 0   brimonidine (ALPHAGAN) 0.2 % ophthalmic solution Place 1 drop into both eyes in the morning and at bedtime.     calcitRIOL (ROCALTROL) 0.5 MCG capsule Take 2 capsules (1 mcg total) by mouth Every Tuesday,Thursday,and Saturday with dialysis. 28 capsule 0   calcium acetate (PHOSLO) 667 MG capsule Take 2 capsules (1,334 mg total) by mouth 3 (three) times daily with meals. 180  capsule 0   calcium elemental as carbonate (CALCIUM ANTACID ULTRA STRENGTH) 400 MG chewable tablet Chew 2,000 mg by mouth daily.     cetirizine (ZYRTEC) 10 MG tablet Take 10 mg by mouth daily.     donepezil (ARICEPT) 10 MG tablet Take 10 mg by mouth daily.     Dulaglutide (TRULICITY) 1.5 XV/4.0GQ SOPN Inject 1.5 mg into the skin every Thursday.     famotidine (PEPCID) 20 MG tablet Take 0.5 tablets (10 mg total) by mouth daily. 30 tablet 0   gabapentin (NEURONTIN) 100 MG capsule Take 200 mg by mouth 3 (three) times daily.     glucose 4 GM chewable tablet Chew 2 tablets (8 g total) by mouth 4 (four) times daily as needed for low blood sugar (for CBG<80). 50 tablet 0   guaiFENesin-dextromethorphan (ROBITUSSIN DM) 100-10 MG/5ML syrup Take 10 mLs by mouth every 4 (four) hours as needed for cough (chest congestion). 118 mL 0   insulin aspart (NOVOLOG FLEXPEN) 100 UNIT/ML FlexPen Inject 3 Units into the skin 3 (three) times daily with meals as needed for high blood sugar (CBG>180). (Patient taking differently: Inject 1-8 Units into the skin See admin instructions. Check FSBS before each meal at bedtime and inject per SSI: 0-150 = 0 units   150-200 = 1 unit   201 - 250 = 2 units   251 - 250 = 3 units   350 - 400 = 5 units   Greater than 400 = 8 units (Prime pen with 2 units prior to each use).) 15 mL 11   Insulin Glargine (BASAGLAR KWIKPEN) 100 UNIT/ML Inject 10 Units into the skin at bedtime.     latanoprost (XALATAN) 0.005 % ophthalmic solution Place 1 drop into both eyes at bedtime.     lidocaine (LIDODERM) 5 % Place 1 patch onto the skin daily. Remove & Discard patch within 12 hours or as directed by MD (Patient not taking: Reported on 08/01/2021) 30 patch 0   loperamide (IMODIUM A-D) 2 MG tablet Take 2-4 mg by mouth See admin instructions. 4 mg after 1st loose stool, then 2 mg after each loose stool. Max 4 tabs in 24 hours     loratadine (CLARITIN) 10 MG tablet Take 10 mg by mouth daily.     Menthol (HALLS  COUGH DROPS) 7.6 MG LOZG Use as directed 1 lozenge in the mouth or throat daily as needed (cough).     midodrine (PROAMATINE) 10 MG tablet Take 10 mg by mouth See admin instructions. 27m every Tuesday, Thursday, Saturday, and Sunday     midodrine (PROAMATINE) 10 MG tablet Take 10 mg by mouth daily as needed (during dialysis treatment if BP drops).     midodrine (PROAMATINE) 5 MG tablet Take 1 tablet (5 mg total) by mouth every Tuesday, Thursday,  Saturday, and Sunday. (Patient not taking: Reported on 11/16/2021) 16 tablet 0   mupirocin cream (BACTROBAN) 2 % Apply 1 application topically 2 (two) times daily. 15 g 0   naproxen sodium (ALEVE) 220 MG tablet Take 440 mg by mouth 2 (two) times daily as needed (pain/headache).     nystatin ointment (MYCOSTATIN) Apply 1 application topically daily.     ondansetron (ZOFRAN ODT) 4 MG disintegrating tablet Take 1 tablet (4 mg total) by mouth every 8 (eight) hours as needed for nausea or vomiting. 20 tablet 0   Pancrelipase, Lip-Prot-Amyl, (ZENPEP) 5000-24000 units CPEP Take 1 capsule by mouth 3 (three) times daily with meals.     pantoprazole (PROTONIX) 40 MG tablet Take 1 tablet (40 mg total) by mouth daily. (Patient taking differently: Take 40 mg by mouth 2 (two) times daily before a meal.) 30 tablet 0   REFRESH TEARS 0.5 % SOLN Place 1 drop into both eyes every hour as needed for dry eyes.     No current facility-administered medications for this visit.    Allergies  Allergen Reactions   Acetaminophen Palpitations   Prednisone Other (See Comments)    Severe hallucinations/paranoid delusions after steroid premedications for contrast allergy, required IV haldol and restraints.    Ivp Dye [Iodinated Contrast Media] Hives     REVIEW OF SYSTEMS:   '[X]'  denotes positive finding, '[ ]'  denotes negative finding Cardiac  Comments:  Chest pain or chest pressure:    Shortness of breath upon exertion:    Short of breath when lying flat:    Irregular heart  rhythm:        Vascular    Pain in calf, thigh, or hip brought on by ambulation:    Pain in feet at night that wakes you up from your sleep:     Blood clot in your veins:    Leg swelling:         Pulmonary    Oxygen at home:    Productive cough:     Wheezing:         Neurologic    Sudden weakness in arms or legs:     Sudden numbness in arms or legs:     Sudden onset of difficulty speaking or slurred speech:    Temporary loss of vision in one eye:     Problems with dizziness:         Gastrointestinal    Blood in stool:     Vomited blood:         Genitourinary    Burning when urinating:     Blood in urine:        Psychiatric    Major depression:         Hematologic    Bleeding problems:    Problems with blood clotting too easily:        Skin    Rashes or ulcers:        Constitutional    Fever or chills:      PHYSICAL EXAMINATION:  There were no vitals filed for this visit.  General:  WDWN in NAD; vital signs documented above Gait: Not observed HENT: WNL, normocephalic Pulmonary: normal non-labored breathing , without wheezing Cardiac: regular HR, Abdomen: soft, NT, no masses Skin: without rashes Vascular Exam/Pulses:  Right Left  Radial 1+ (weak) 1+ (weak)      Femoral 2+ (normal) 2+ (normal)  Popliteal    DP absent absent  PT absent absent   Extremities: with  ischemic changes, without Gangrene , without cellulitis; with open wounds;  Musculoskeletal: no muscle wasting or atrophy  Neurologic: A&O X 3;  No focal weakness or paresthesias are detected Psychiatric:  The pt has Normal affect.   Non-Invasive Vascular Imaging:   +---------+------------------+-----+----------+-------+   Left      Lt Pressure (mmHg) Index Waveform   Comment   +---------+------------------+-----+----------+-------+   Brachial  140                                           +---------+------------------+-----+----------+-------+   PTA       84                 0.60   monophasic           +---------+------------------+-----+----------+-------+   DP        130                0.93  monophasic           +---------+------------------+-----+----------+-------+   Great Toe 51                 0.36                       +---------+------------------+-----+----------+-------+     ASSESSMENT/PLAN: Jose Robinson is a 66 y.o. male presenting with nonhealing wounds to the left foot and also elevated ABIs.  On physical exam the patient did not have a palpable pulse in the foot.  Jose Robinson's vascular disease is classified as Rutherford 5 critical limb ischemia  He would benefit from left lower extremity angiography in an effort to define and improve perfusion in the left leg.  Without revascularization, Jose Robinson has a high risk of limb loss over the next year After discussing the risks and benefits of upper extremity angiography with possible intervention, Jose Robinson elected to proceed.  Jose John, MD [facility] [phone]

## 2022-01-31 ENCOUNTER — Other Ambulatory Visit: Payer: Self-pay

## 2022-01-31 DIAGNOSIS — I739 Peripheral vascular disease, unspecified: Secondary | ICD-10-CM

## 2022-01-31 NOTE — Progress Notes (Signed)
Error

## 2022-02-01 ENCOUNTER — Ambulatory Visit (INDEPENDENT_AMBULATORY_CARE_PROVIDER_SITE_OTHER)
Admission: RE | Admit: 2022-02-01 | Discharge: 2022-02-01 | Disposition: A | Discharge status: Home / Self Care | Payer: Medicare (Managed Care) | Source: Ambulatory Visit | Attending: Vascular Surgery | Admitting: Vascular Surgery

## 2022-02-01 ENCOUNTER — Other Ambulatory Visit: Payer: Self-pay

## 2022-02-01 ENCOUNTER — Ambulatory Visit (INDEPENDENT_AMBULATORY_CARE_PROVIDER_SITE_OTHER): Payer: Medicare (Managed Care) | Admitting: Vascular Surgery

## 2022-02-01 ENCOUNTER — Ambulatory Visit (HOSPITAL_COMMUNITY)
Admission: RE | Admit: 2022-02-01 | Discharge: 2022-02-01 | Disposition: A | Discharge status: Home / Self Care | Payer: Medicare (Managed Care) | Source: Ambulatory Visit | Attending: Vascular Surgery | Admitting: Vascular Surgery

## 2022-02-01 ENCOUNTER — Encounter (HOSPITAL_COMMUNITY): Payer: Self-pay

## 2022-02-01 ENCOUNTER — Encounter: Payer: Self-pay | Admitting: Vascular Surgery

## 2022-02-01 VITALS — BP 120/80 | HR 81 | Temp 98.0°F | Resp 20 | Ht 71.0 in | Wt 147.0 lb

## 2022-02-01 DIAGNOSIS — I739 Peripheral vascular disease, unspecified: Secondary | ICD-10-CM | POA: Insufficient documentation

## 2022-02-01 DIAGNOSIS — I70222 Atherosclerosis of native arteries of extremities with rest pain, left leg: Secondary | ICD-10-CM

## 2022-02-06 ENCOUNTER — Other Ambulatory Visit: Payer: Self-pay

## 2022-02-06 ENCOUNTER — Ambulatory Visit (HOSPITAL_COMMUNITY)
Admission: RE | Admit: 2022-02-06 | Discharge: 2022-02-06 | Disposition: A | Discharge status: Home / Self Care | Payer: Medicare (Managed Care) | Attending: Vascular Surgery | Admitting: Vascular Surgery

## 2022-02-06 ENCOUNTER — Encounter (HOSPITAL_COMMUNITY)
Admission: RE | Disposition: A | Discharge status: Home / Self Care | Payer: Self-pay | Source: Home / Self Care | Attending: Vascular Surgery

## 2022-02-06 DIAGNOSIS — N186 End stage renal disease: Secondary | ICD-10-CM | POA: Insufficient documentation

## 2022-02-06 DIAGNOSIS — E1122 Type 2 diabetes mellitus with diabetic chronic kidney disease: Secondary | ICD-10-CM | POA: Insufficient documentation

## 2022-02-06 DIAGNOSIS — I700 Atherosclerosis of aorta: Secondary | ICD-10-CM | POA: Diagnosis not present

## 2022-02-06 DIAGNOSIS — I5022 Chronic systolic (congestive) heart failure: Secondary | ICD-10-CM | POA: Diagnosis not present

## 2022-02-06 DIAGNOSIS — I70245 Atherosclerosis of native arteries of left leg with ulceration of other part of foot: Secondary | ICD-10-CM | POA: Insufficient documentation

## 2022-02-06 DIAGNOSIS — F17210 Nicotine dependence, cigarettes, uncomplicated: Secondary | ICD-10-CM | POA: Diagnosis not present

## 2022-02-06 DIAGNOSIS — Z7982 Long term (current) use of aspirin: Secondary | ICD-10-CM | POA: Insufficient documentation

## 2022-02-06 DIAGNOSIS — E1151 Type 2 diabetes mellitus with diabetic peripheral angiopathy without gangrene: Secondary | ICD-10-CM | POA: Insufficient documentation

## 2022-02-06 DIAGNOSIS — J449 Chronic obstructive pulmonary disease, unspecified: Secondary | ICD-10-CM | POA: Insufficient documentation

## 2022-02-06 DIAGNOSIS — I132 Hypertensive heart and chronic kidney disease with heart failure and with stage 5 chronic kidney disease, or end stage renal disease: Secondary | ICD-10-CM | POA: Diagnosis not present

## 2022-02-06 DIAGNOSIS — Z89511 Acquired absence of right leg below knee: Secondary | ICD-10-CM | POA: Diagnosis not present

## 2022-02-06 DIAGNOSIS — L97529 Non-pressure chronic ulcer of other part of left foot with unspecified severity: Secondary | ICD-10-CM | POA: Diagnosis not present

## 2022-02-06 DIAGNOSIS — E11621 Type 2 diabetes mellitus with foot ulcer: Secondary | ICD-10-CM | POA: Insufficient documentation

## 2022-02-06 DIAGNOSIS — Z992 Dependence on renal dialysis: Secondary | ICD-10-CM | POA: Diagnosis not present

## 2022-02-06 DIAGNOSIS — Z794 Long term (current) use of insulin: Secondary | ICD-10-CM | POA: Diagnosis not present

## 2022-02-06 HISTORY — PX: ABDOMINAL AORTOGRAM W/LOWER EXTREMITY: CATH118223

## 2022-02-06 LAB — POCT I-STAT, CHEM 8
BUN: 33 mg/dL — ABNORMAL HIGH (ref 8–23)
Calcium, Ion: 0.9 mmol/L — ABNORMAL LOW (ref 1.15–1.40)
Chloride: 99 mmol/L (ref 98–111)
Creatinine, Ser: 7.5 mg/dL — ABNORMAL HIGH (ref 0.61–1.24)
Glucose, Bld: 196 mg/dL — ABNORMAL HIGH (ref 70–99)
HCT: 46 % (ref 39.0–52.0)
Hemoglobin: 15.6 g/dL (ref 13.0–17.0)
Potassium: 5.7 mmol/L — ABNORMAL HIGH (ref 3.5–5.1)
Sodium: 133 mmol/L — ABNORMAL LOW (ref 135–145)
TCO2: 27 mmol/L (ref 22–32)

## 2022-02-06 LAB — GLUCOSE, CAPILLARY: Glucose-Capillary: 117 mg/dL — ABNORMAL HIGH (ref 70–99)

## 2022-02-06 SURGERY — ABDOMINAL AORTOGRAM W/LOWER EXTREMITY
Anesthesia: LOCAL | Laterality: Left

## 2022-02-06 MED ORDER — SODIUM CHLORIDE 0.9% FLUSH: 3.0000 mL | Freq: Two times a day (BID) | INTRAVENOUS | Status: DC

## 2022-02-06 MED ORDER — LIDOCAINE HCL (PF) 1 % IJ SOLN
INTRAMUSCULAR | Status: DC | PRN
  Administered 2022-02-06: 10 mL

## 2022-02-06 MED ORDER — SODIUM CHLORIDE 0.9% FLUSH: 3.0000 mL | INTRAVENOUS | Status: DC | PRN

## 2022-02-06 MED ORDER — HEPARIN SODIUM (PORCINE) 1000 UNIT/ML IJ SOLN
INTRAMUSCULAR | Status: DC | PRN
  Administered 2022-02-06: 6000 [IU] via INTRAVENOUS

## 2022-02-06 MED ORDER — MIDAZOLAM HCL 2 MG/2ML IJ SOLN
INTRAMUSCULAR | Status: AC
  Filled 2022-02-06: qty 2

## 2022-02-06 MED ORDER — HEPARIN (PORCINE) IN NACL 1000-0.9 UT/500ML-% IV SOLN
INTRAVENOUS | Status: AC
  Filled 2022-02-06: qty 500

## 2022-02-06 MED ORDER — SODIUM CHLORIDE 0.9 % IV SOLN: 250.0000 mL | INTRAVENOUS | Status: DC | PRN

## 2022-02-06 MED ORDER — CLOPIDOGREL BISULFATE 75 MG PO TABS: 75.0000 mg | ORAL_TABLET | Freq: Every day | ORAL | Status: DC

## 2022-02-06 MED ORDER — CLOPIDOGREL BISULFATE 75 MG PO TABS
ORAL_TABLET | ORAL | Status: AC
  Filled 2022-02-06: qty 4

## 2022-02-06 MED ORDER — SODIUM CHLORIDE 0.9 % WEIGHT BASED INFUSION: 1.0000 mL/kg/h | INTRAVENOUS | Status: DC

## 2022-02-06 MED ORDER — DIPHENHYDRAMINE HCL 50 MG/ML IJ SOLN: 25.0000 mg | INTRAMUSCULAR | Status: AC

## 2022-02-06 MED ORDER — MIDAZOLAM HCL 2 MG/2ML IJ SOLN
INTRAMUSCULAR | Status: DC | PRN
  Administered 2022-02-06: 1 mg via INTRAVENOUS

## 2022-02-06 MED ORDER — HEPARIN SODIUM (PORCINE) 1000 UNIT/ML IJ SOLN
INTRAMUSCULAR | Status: AC
  Filled 2022-02-06: qty 10

## 2022-02-06 MED ORDER — HEPARIN (PORCINE) IN NACL 1000-0.9 UT/500ML-% IV SOLN
INTRAVENOUS | Status: DC | PRN
  Administered 2022-02-06 (×2): 500 mL

## 2022-02-06 MED ORDER — DIPHENHYDRAMINE HCL 50 MG/ML IJ SOLN
INTRAMUSCULAR | Status: AC
  Administered 2022-02-06: 25 mg via INTRAVENOUS
  Filled 2022-02-06: qty 1

## 2022-02-06 MED ORDER — SODIUM CHLORIDE 0.9 % IV SOLN
250.0000 mL | INTRAVENOUS | Status: DC | PRN
  Administered 2022-02-06: 250 mL via INTRAVENOUS

## 2022-02-06 MED ORDER — CLOPIDOGREL BISULFATE 300 MG PO TABS
ORAL_TABLET | ORAL | Status: DC | PRN
  Administered 2022-02-06: 300 mg via ORAL

## 2022-02-06 MED ORDER — FAMOTIDINE IN NACL 20-0.9 MG/50ML-% IV SOLN
20.0000 mg | INTRAVENOUS | Status: AC
  Administered 2022-02-06: 20 mg via INTRAVENOUS
  Filled 2022-02-06 (×2): qty 50

## 2022-02-06 MED ORDER — CLOPIDOGREL BISULFATE 300 MG PO TABS
ORAL_TABLET | ORAL | Status: AC
  Filled 2022-02-06: qty 1

## 2022-02-06 MED ORDER — ACETAMINOPHEN 325 MG PO TABS: 650.0000 mg | ORAL_TABLET | ORAL | Status: DC | PRN

## 2022-02-06 MED ORDER — FENTANYL CITRATE (PF) 100 MCG/2ML IJ SOLN
INTRAMUSCULAR | Status: AC
  Filled 2022-02-06: qty 2

## 2022-02-06 MED ORDER — ONDANSETRON HCL 4 MG/2ML IJ SOLN: 4.0000 mg | Freq: Four times a day (QID) | INTRAMUSCULAR | Status: DC | PRN

## 2022-02-06 MED ORDER — LABETALOL HCL 5 MG/ML IV SOLN: 10.0000 mg | INTRAVENOUS | Status: DC | PRN

## 2022-02-06 MED ORDER — CLOPIDOGREL BISULFATE 75 MG PO TABS: 75.0000 mg | ORAL_TABLET | Freq: Every day | ORAL | 11 refills | Status: AC

## 2022-02-06 MED ORDER — LIDOCAINE HCL (PF) 1 % IJ SOLN
INTRAMUSCULAR | Status: AC
  Filled 2022-02-06: qty 30

## 2022-02-06 MED ORDER — HYDRALAZINE HCL 20 MG/ML IJ SOLN: 5.0000 mg | INTRAMUSCULAR | Status: DC | PRN

## 2022-02-06 MED ORDER — METHYLPREDNISOLONE SODIUM SUCC 125 MG IJ SOLR
125.0000 mg | Freq: Once | INTRAMUSCULAR | Status: AC
  Administered 2022-02-06: 125 mg via INTRAVENOUS
  Filled 2022-02-06: qty 2

## 2022-02-06 MED ORDER — IODIXANOL 320 MG/ML IV SOLN
INTRAVENOUS | Status: DC | PRN
  Administered 2022-02-06: 80 mL

## 2022-02-06 MED ORDER — FENTANYL CITRATE (PF) 100 MCG/2ML IJ SOLN
INTRAMUSCULAR | Status: DC | PRN
  Administered 2022-02-06: 50 ug via INTRAVENOUS

## 2022-02-06 SURGICAL SUPPLY — 19 items
BALLN MUSTANG 5.0X40 135 (BALLOONS) ×2
BALLOON MUSTANG 5.0X40 135 (BALLOONS) IMPLANT
CATH NAVICROSS ANG 65CM (CATHETERS) IMPLANT
CATH OMNI FLUSH 5F 65CM (CATHETERS) ×1 IMPLANT
CATHETER NAVICROSS ANG 65CM (CATHETERS) ×2
CLOSURE PERCLOSE PROSTYLE (VASCULAR PRODUCTS) ×1 IMPLANT
GLIDEWIRE ADV .035X260CM (WIRE) ×1 IMPLANT
GUIDEWIRE ANGLED .035X150CM (WIRE) ×1 IMPLANT
KIT ENCORE 26 ADVANTAGE (KITS) ×1 IMPLANT
KIT MICROPUNCTURE NIT STIFF (SHEATH) ×1 IMPLANT
KIT PV (KITS) ×2 IMPLANT
SHEATH PINNACLE 5F 10CM (SHEATH) ×1 IMPLANT
SHEATH PINNACLE ST 6F 45CM (SHEATH) ×1 IMPLANT
SHEATH PROBE COVER 6X72 (BAG) ×1 IMPLANT
STENT ELUVIA 6X40X130 (Permanent Stent) ×1 IMPLANT
SYR MEDRAD MARK V 150ML (SYRINGE) ×1 IMPLANT
TRANSDUCER W/STOPCOCK (MISCELLANEOUS) ×2 IMPLANT
TRAY PV CATH (CUSTOM PROCEDURE TRAY) ×2 IMPLANT
WIRE BENTSON .035X145CM (WIRE) ×1 IMPLANT

## 2022-02-06 NOTE — Op Note (Addendum)
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  Ultrasound-guided micropuncture access of the right common femoral artery 2.  Aortogram 3.  Second-order cannulation, left lower extremity 4.  SFA stent-Eluvia 6 x 40 mm followed by post plasty using a 5 x 40 mm balloon 5.  Right lower extremity angiogram 6.  Closure device - proglide 7.  Moderate sedation time 48min 8.  Contrast 33ml   Indications: Jose Robinson SABER is a 66 y.o. male presenting with nonhealing wounds to the left foot and also elevated ABIs.  On physical exam the patient did not have a palpable pulse in the foot. Alby's vascular disease is classified as Rutherford 5 critical limb ischemia He would benefit from left lower extremity angiography in an effort to define and improve perfusion in the left leg.  Without revascularization, Jose Robinson has a high risk of limb loss over the next year After discussing the risks and benefits of upper extremity angiography with possible intervention, Jose Robinson elected to proceed.  Findings:  Normal bilateral renal arteries, atherosclerotic disease of the infrarenal abdominal aorta, no flow-limiting stenosis.  No flow-limiting stenosis within the iliac system bilaterally.  On the right: Normal common femoral artery, small profunda, normal superficial femoral artery.  Some stenosis within the popliteal artery, however this appears to be nonflow limiting with branching prior to ending in the below-knee amputation stump.  On the left: Normal common femoral artery, normal profunda, superficial femoral artery demonstrates focal area of greater than 80% stenosis roughly 6 cm from its ostia.  No other areas of stenosis identified.  Normal popliteal artery.  Normal trifurcation.  The peroneal artery is  atretic with two-vessel runoff to the foot being anterior tibial and posterior tibial arteries.  Dorsalis pedis and plantar arteries fill in the foot.   Procedure:  The patient was identified in the holding area and taken to room 8.  The patient was then placed supine on the table and prepped and draped in the usual sterile fashion.  A time out was called.  Ultrasound was used to evaluate the right common femoral artery.  It was patent .  A digital ultrasound image was acquired.  A micropuncture needle was used to access the right common femoral artery under ultrasound guidance.  An 018 wire was advanced without resistance and a micropuncture sheath was placed.  The 018 wire was removed and a benson wire was placed.  The micropuncture sheath was exchanged for a 5 french sheath.  An omniflush catheter was advanced over the wire to the level of L-1.  An abdominal angiogram was obtained.  Next, using the omniflush catheter and a benson wire, the aortic bifurcation was crossed and the catheter was placed into theleft external iliac artery and left runoff was obtained.  right runoff was performed via retrograde sheath injections.  I made the decision to intervene on the left sided focal superficial femoral artery stenosis.  Being that the artery was heavily calcified, I made the decision to primarily stent using a 6 x 59mm Eluvia.  This was postdilated using a 5 x 44mm balloon with an excellent result.  The patient was closed using a ProGlide device without issue.  Patient's left lower extremity is optimally revascularized.  I will have him follow-up with podiatry for management of his foot.  He was started on  dual antiplatelet therapy.     Cassandria Santee, MD [facility] Office: [phone]

## 2022-02-06 NOTE — Discharge Instructions (Signed)
Femoral Site Care This sheet gives you information about how to care for yourself after your procedure. Your health care provider may also give you more specific instructions. If you have problems or questions, contact your health care provider. What can I expect after the procedure? After the procedure, it is common to have: Bruising that usually fades within 1-2 weeks. Tenderness at the site. Follow these instructions at home: Wound care May remove bandage after 24 hours. Do not take baths, swim, or use a hot tub for 5 days. You may shower 24-48 hours after the procedure. Gently wash the site with plain soap and water. Pat the area dry with a clean towel. Do not rub the site. This may cause bleeding. Do not apply powder or lotion to the site. Keep the site clean and dry. Check your femoral site every day for signs of infection. Check for: Redness, swelling, or pain. Fluid or blood. Warmth. Pus or a bad smell. Activity For the first 2-3 days after your procedure, or as long as directed: Avoid climbing stairs as much as possible. Do not squat. Do not lift, push or pull anything that is heavier than 10 lb for 5 days. Rest as directed. Avoid sitting for a long time without moving. Get up to take short walks every 1-2 hours. Do not drive for 24 hours. General instructions Take over-the-counter and prescription medicines only as told by your health care provider. Keep all follow-up visits as told by your health care provider. This is important. DRINK PLENTY OF FLUIDS FOR THE NEXT 2-3 DAYS. Contact a health care provider if you have: A fever or chills. You have redness, swelling, or pain around your insertion site. Get help right away if: The catheter insertion area swells very fast. You pass out. You suddenly start to sweat or your skin gets clammy. The catheter insertion area is bleeding, and the bleeding does not stop when you hold steady pressure on the area. The area near or  just beyond the catheter insertion site becomes pale, cool, tingly, or numb. These symptoms may represent a serious problem that is an emergency. Do not wait to see if the symptoms will go away. Get medical help right away. Call your local emergency services (911 in the [place] Do not drive yourself to the hospital. Summary After the procedure, it is common to have bruising that usually fades within 1-2 weeks. Check your femoral site every day for signs of infection. Do not lift, push or pull anything that is heavier than 10 lb for 5 days.  This information is not intended to replace advice given to you by your health care provider. Make sure you discuss any questions you have with your health care provider. Document Revised: 12/29/2017 Document Reviewed: 12/29/2017 Elsevier Patient Education  2020 [employer]  

## 2022-02-06 NOTE — H&P (Signed)
Office Note   Patient seen and examined in preop holding.  No complaints. No changes to medication history or physical exam since last seen in clinic. After discussing the risks and benefits of left leg angiography with possible intervention to define and improve perfusion for wound healing, Jose Robinson elected to proceed.   Broadus John MD   CC:  Wound Requesting Provider:  No ref. provider found  HPI: Jose Robinson is a 66 y.o. (Dec 24, 1956) male presenting at the request of Dr Linton Ham for left lower extremity foot wounds.  Medical history includes: End-stage renal disease on hemodialysis TTS, peripheral artery disease, chronic systolic heart failure, COPD, insulin-dependent diabetes mellitus, active tobacco use, hypotension postdialysis.  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  Patient has been getting majority of his care at outside institution such as [facility].  He recently relocated from [place] in [place] to [place] as of January 2022 to be closer to his daughter and new granddaughter.  On exam, Strummer was doing well.  His major complaint was a nonhealing wound on the medial aspect of his left foot at the first metatarsal.  This has been present for several months.  Although he spends significant time in a wheelchair, he is able to stand and pivot using both his right sided prosthesis and left leg.  He would like to do everything possible to save his left lower extremity.  He denies vascular surgery history in the left leg.  The pt is  on a statin for cholesterol management.  The pt is  on a daily aspirin.   Other AC:  - The pt is  on medication for hypertension.   The pt is  diabetic.  Tobacco hx:  every day   Past Medical History:  Diagnosis  Date   Diabetes mellitus without complication ([place])    ESRD on hemodialysis ([place])    Hypertension     Past Surgical History:  Procedure Laterality Date   BACK SURGERY     BELOW KNEE LEG AMPUTATION      Social History   Socioeconomic History   Marital status: Single    Spouse name: Not on file   Number of children: Not on file   Years of education: Not on file   Highest education level: Not on file  Occupational History   Not on file  Tobacco Use   Smoking status: Every Day    Packs/day: 0.50    Types: Cigarettes   Smokeless tobacco: Never  Vaping Use   Vaping Use: Never used  Substance and Sexual Activity   Alcohol use: Not Currently   Drug use: Not Currently   Sexual activity: Not on file  Other Topics Concern   Not on file  Social History Narrative   Not on file   Social Determinants of Health   Financial Resource Strain: Not on file  Food Insecurity: Not on file  Transportation Needs: Not on file  Physical Activity: Not on file  Stress: Not on file  Social Connections: Not on file  Intimate Partner Violence: Not on file    Family History  Family history unknown: Yes    Current Facility-Administered Medications  Medication Dose Route Frequency Provider Last Rate Last Admin   0.9 %  sodium chloride infusion  250 mL Intravenous PRN  Broadus John, MD       diphenhydrAMINE (BENADRYL) injection 25 mg  25 mg Intravenous On Call Broadus John, MD       famotidine (PEPCID) IVPB 20 mg premix  20 mg Intravenous On Call Broadus John, MD       sodium chloride flush (NS) 0.9 % injection 3 mL  3 mL Intravenous Q12H Broadus John, MD       sodium chloride flush (NS) 0.9 % injection 3 mL  3 mL Intravenous PRN Broadus John, MD        Allergies  Allergen Reactions   Acetaminophen Palpitations   Prednisone Other (See Comments)    Severe hallucinations/paranoid delusions after steroid premedications for contrast allergy, required IV haldol and  restraints.    Ivp Dye [Iodinated Contrast Media] Hives     REVIEW OF SYSTEMS:   [X]  denotes positive finding, [ ]  denotes negative finding Cardiac  Comments:  Chest pain or chest pressure:    Shortness of breath upon exertion:    Short of breath when lying flat:    Irregular heart rhythm:        Vascular    Pain in calf, thigh, or hip brought on by ambulation:    Pain in feet at night that wakes you up from your sleep:     Blood clot in your veins:    Leg swelling:         Pulmonary    Oxygen at home:    Productive cough:     Wheezing:         Neurologic    Sudden weakness in arms or legs:     Sudden numbness in arms or legs:     Sudden onset of difficulty speaking or slurred speech:    Temporary loss of vision in one eye:     Problems with dizziness:         Gastrointestinal    Blood in stool:     Vomited blood:         Genitourinary    Burning when urinating:     Blood in urine:        Psychiatric    Major depression:         Hematologic    Bleeding problems:    Problems with blood clotting too easily:        Skin    Rashes or ulcers:        Constitutional    Fever or chills:      PHYSICAL EXAMINATION:  Vitals:   02/06/22 0757  BP: 119/70  Pulse: 89  Resp: 18  Temp: 98.1 F (36.7 C)  TempSrc: Oral  Weight: 66.7 kg  Height: 5\' 11"  (1.803 m)    General:  WDWN in NAD; vital signs documented above Gait: Not observed HENT: WNL, normocephalic Pulmonary: normal non-labored breathing , without wheezing Cardiac: regular HR, Abdomen: soft, NT, no masses Skin: without rashes Vascular Exam/Pulses:  Right Left  Radial 1+ (weak) 1+ (weak)      Femoral 2+ (normal) 2+ (normal)  Popliteal    DP absent absent  PT absent absent   Extremities: with ischemic changes, without Gangrene , without cellulitis; with open wounds;  Musculoskeletal: no muscle wasting or atrophy  Neurologic: A&O X 3;  No focal weakness or paresthesias are  detected Psychiatric:  The pt has Normal affect.   Non-Invasive Vascular Imaging:   +---------+------------------+-----+----------+-------+   Left  Lt Pressure (mmHg) Index Waveform   Comment   +---------+------------------+-----+----------+-------+   Brachial  140                                           +---------+------------------+-----+----------+-------+   PTA       84                 0.60  monophasic           +---------+------------------+-----+----------+-------+   DP        130                0.93  monophasic           +---------+------------------+-----+----------+-------+   Great Toe 51                 0.36                       +---------+------------------+-----+----------+-------+     ASSESSMENT/PLAN: Indy JATNIEL VERASTEGUI is a 66 y.o. male presenting with nonhealing wounds to the left foot and also elevated ABIs.  On physical exam the patient did not have a palpable pulse in the foot.  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  Without revascularization, [name] has a high risk of limb loss over the next year After discussing the risks and benefits of upper extremity angiography with possible intervention, Nasri elected to proceed.  Broadus John, MD [facility] [phone]

## 2022-02-07 ENCOUNTER — Encounter (HOSPITAL_COMMUNITY): Payer: Self-pay | Admitting: Vascular Surgery

## 2022-02-07 MED FILL — Clopidogrel Bisulfate Tab 75 MG (Base Equiv): ORAL | Qty: 4 | Status: AC

## 2022-02-14 ENCOUNTER — Observation Stay (HOSPITAL_COMMUNITY)
Admission: EM | Admit: 2022-02-14 | Discharge: 2022-02-18 | Disposition: A | Discharge status: Home Health | Payer: Medicare (Managed Care) | Attending: Family Medicine | Admitting: Family Medicine

## 2022-02-14 ENCOUNTER — Encounter (HOSPITAL_COMMUNITY): Payer: Self-pay | Admitting: Certified Registered Nurse Anesthetist

## 2022-02-14 ENCOUNTER — Emergency Department (HOSPITAL_COMMUNITY): Payer: Medicare (Managed Care) | Admitting: Certified Registered Nurse Anesthetist

## 2022-02-14 ENCOUNTER — Emergency Department (HOSPITAL_COMMUNITY): Payer: Medicare (Managed Care)

## 2022-02-14 ENCOUNTER — Encounter (HOSPITAL_COMMUNITY)
Admission: EM | Disposition: A | Discharge status: Home Health | Payer: Self-pay | Source: Home / Self Care | Attending: Emergency Medicine

## 2022-02-14 ENCOUNTER — Observation Stay (HOSPITAL_COMMUNITY): Payer: Medicare (Managed Care)

## 2022-02-14 ENCOUNTER — Emergency Department (HOSPITAL_BASED_OUTPATIENT_CLINIC_OR_DEPARTMENT_OTHER): Payer: Medicare (Managed Care) | Admitting: Certified Registered Nurse Anesthetist

## 2022-02-14 ENCOUNTER — Other Ambulatory Visit: Payer: Self-pay

## 2022-02-14 DIAGNOSIS — Z79899 Other long term (current) drug therapy: Secondary | ICD-10-CM | POA: Insufficient documentation

## 2022-02-14 DIAGNOSIS — Z89519 Acquired absence of unspecified leg below knee: Secondary | ICD-10-CM | POA: Insufficient documentation

## 2022-02-14 DIAGNOSIS — M542 Cervicalgia: Secondary | ICD-10-CM | POA: Diagnosis present

## 2022-02-14 DIAGNOSIS — N186 End stage renal disease: Secondary | ICD-10-CM | POA: Insufficient documentation

## 2022-02-14 DIAGNOSIS — I132 Hypertensive heart and chronic kidney disease with heart failure and with stage 5 chronic kidney disease, or end stage renal disease: Secondary | ICD-10-CM | POA: Insufficient documentation

## 2022-02-14 DIAGNOSIS — E119 Type 2 diabetes mellitus without complications: Secondary | ICD-10-CM | POA: Diagnosis not present

## 2022-02-14 DIAGNOSIS — Z20822 Contact with and (suspected) exposure to covid-19: Secondary | ICD-10-CM | POA: Diagnosis not present

## 2022-02-14 DIAGNOSIS — S12000A Unspecified displaced fracture of first cervical vertebra, initial encounter for closed fracture: Secondary | ICD-10-CM | POA: Insufficient documentation

## 2022-02-14 DIAGNOSIS — W01198A Fall on same level from slipping, tripping and stumbling with subsequent striking against other object, initial encounter: Secondary | ICD-10-CM | POA: Insufficient documentation

## 2022-02-14 DIAGNOSIS — S0530XA Ocular laceration without prolapse or loss of intraocular tissue, unspecified eye, initial encounter: Secondary | ICD-10-CM | POA: Diagnosis present

## 2022-02-14 DIAGNOSIS — Y92128 Other place in nursing home as the place of occurrence of the external cause: Secondary | ICD-10-CM | POA: Diagnosis not present

## 2022-02-14 DIAGNOSIS — I5022 Chronic systolic (congestive) heart failure: Secondary | ICD-10-CM | POA: Insufficient documentation

## 2022-02-14 DIAGNOSIS — T1490XA Injury, unspecified, initial encounter: Secondary | ICD-10-CM

## 2022-02-14 DIAGNOSIS — S12000G Unspecified displaced fracture of first cervical vertebra, subsequent encounter for fracture with delayed healing: Secondary | ICD-10-CM

## 2022-02-14 DIAGNOSIS — S12090K Other displaced fracture of first cervical vertebra, subsequent encounter for fracture with nonunion: Secondary | ICD-10-CM

## 2022-02-14 DIAGNOSIS — E1122 Type 2 diabetes mellitus with diabetic chronic kidney disease: Secondary | ICD-10-CM

## 2022-02-14 DIAGNOSIS — I739 Peripheral vascular disease, unspecified: Secondary | ICD-10-CM | POA: Diagnosis present

## 2022-02-14 DIAGNOSIS — Z7901 Long term (current) use of anticoagulants: Secondary | ICD-10-CM | POA: Diagnosis not present

## 2022-02-14 DIAGNOSIS — S025XXB Fracture of tooth (traumatic), initial encounter for open fracture: Secondary | ICD-10-CM

## 2022-02-14 DIAGNOSIS — Z992 Dependence on renal dialysis: Secondary | ICD-10-CM | POA: Insufficient documentation

## 2022-02-14 DIAGNOSIS — F17210 Nicotine dependence, cigarettes, uncomplicated: Secondary | ICD-10-CM | POA: Diagnosis not present

## 2022-02-14 DIAGNOSIS — S0990XA Unspecified injury of head, initial encounter: Secondary | ICD-10-CM | POA: Diagnosis present

## 2022-02-14 DIAGNOSIS — I120 Hypertensive chronic kidney disease with stage 5 chronic kidney disease or end stage renal disease: Secondary | ICD-10-CM | POA: Diagnosis not present

## 2022-02-14 DIAGNOSIS — S0531XA Ocular laceration without prolapse or loss of intraocular tissue, right eye, initial encounter: Secondary | ICD-10-CM

## 2022-02-14 DIAGNOSIS — S01511A Laceration without foreign body of lip, initial encounter: Secondary | ICD-10-CM | POA: Diagnosis not present

## 2022-02-14 DIAGNOSIS — Z7982 Long term (current) use of aspirin: Secondary | ICD-10-CM | POA: Diagnosis not present

## 2022-02-14 DIAGNOSIS — Z794 Long term (current) use of insulin: Secondary | ICD-10-CM | POA: Insufficient documentation

## 2022-02-14 DIAGNOSIS — R109 Unspecified abdominal pain: Secondary | ICD-10-CM | POA: Diagnosis not present

## 2022-02-14 DIAGNOSIS — W19XXXA Unspecified fall, initial encounter: Secondary | ICD-10-CM | POA: Diagnosis present

## 2022-02-14 DIAGNOSIS — M25511 Pain in right shoulder: Secondary | ICD-10-CM

## 2022-02-14 DIAGNOSIS — Y92129 Unspecified place in nursing home as the place of occurrence of the external cause: Secondary | ICD-10-CM

## 2022-02-14 DIAGNOSIS — Z0189 Encounter for other specified special examinations: Secondary | ICD-10-CM

## 2022-02-14 DIAGNOSIS — Z7984 Long term (current) use of oral hypoglycemic drugs: Secondary | ICD-10-CM | POA: Diagnosis not present

## 2022-02-14 DIAGNOSIS — R519 Headache, unspecified: Secondary | ICD-10-CM | POA: Diagnosis present

## 2022-02-14 DIAGNOSIS — Z1389 Encounter for screening for other disorder: Secondary | ICD-10-CM

## 2022-02-14 HISTORY — PX: RUPTURED GLOBE EXPLORATION AND REPAIR: SHX2366

## 2022-02-14 HISTORY — PX: FACIAL LACERATION REPAIR: SHX6589

## 2022-02-14 LAB — CBC WITH DIFFERENTIAL/PLATELET
Abs Immature Granulocytes: 0.01 10*3/uL (ref 0.00–0.07)
Basophils Absolute: 0 10*3/uL (ref 0.0–0.1)
Basophils Relative: 1 %
Eosinophils Absolute: 0.1 10*3/uL (ref 0.0–0.5)
Eosinophils Relative: 2 %
HCT: 47.4 % (ref 39.0–52.0)
Hemoglobin: 15.1 g/dL (ref 13.0–17.0)
Immature Granulocytes: 0 %
Lymphocytes Relative: 28 %
Lymphs Abs: 1 10*3/uL (ref 0.7–4.0)
MCH: 29.5 pg (ref 26.0–34.0)
MCHC: 31.9 g/dL (ref 30.0–36.0)
MCV: 92.6 fL (ref 80.0–100.0)
Monocytes Absolute: 0.4 10*3/uL (ref 0.1–1.0)
Monocytes Relative: 10 %
Neutro Abs: 2.2 10*3/uL (ref 1.7–7.7)
Neutrophils Relative %: 59 %
Platelets: 142 10*3/uL — ABNORMAL LOW (ref 150–400)
RBC: 5.12 MIL/uL (ref 4.22–5.81)
RDW: 16.3 % — ABNORMAL HIGH (ref 11.5–15.5)
WBC: 3.7 10*3/uL — ABNORMAL LOW (ref 4.0–10.5)
nRBC: 0 % (ref 0.0–0.2)

## 2022-02-14 LAB — I-STAT CHEM 8, ED
BUN: 45 mg/dL — ABNORMAL HIGH (ref 8–23)
Calcium, Ion: 1.03 mmol/L — ABNORMAL LOW (ref 1.15–1.40)
Chloride: 98 mmol/L (ref 98–111)
Creatinine, Ser: 8.5 mg/dL — ABNORMAL HIGH (ref 0.61–1.24)
Glucose, Bld: 87 mg/dL (ref 70–99)
HCT: 50 % (ref 39.0–52.0)
Hemoglobin: 17 g/dL (ref 13.0–17.0)
Potassium: 4.8 mmol/L (ref 3.5–5.1)
Sodium: 130 mmol/L — ABNORMAL LOW (ref 135–145)
TCO2: 22 mmol/L (ref 22–32)

## 2022-02-14 LAB — COMPREHENSIVE METABOLIC PANEL
ALT: 61 U/L — ABNORMAL HIGH (ref 0–44)
AST: 53 U/L — ABNORMAL HIGH (ref 15–41)
Albumin: 3.5 g/dL (ref 3.5–5.0)
Alkaline Phosphatase: 97 U/L (ref 38–126)
Anion gap: 19 — ABNORMAL HIGH (ref 5–15)
BUN: 39 mg/dL — ABNORMAL HIGH (ref 8–23)
CO2: 18 mmol/L — ABNORMAL LOW (ref 22–32)
Calcium: 9.1 mg/dL (ref 8.9–10.3)
Chloride: 92 mmol/L — ABNORMAL LOW (ref 98–111)
Creatinine, Ser: 7.77 mg/dL — ABNORMAL HIGH (ref 0.61–1.24)
GFR, Estimated: 7 mL/min — ABNORMAL LOW (ref >60)
Glucose, Bld: 86 mg/dL (ref 70–99)
Potassium: 4.8 mmol/L (ref 3.5–5.1)
Sodium: 129 mmol/L — ABNORMAL LOW (ref 135–145)
Total Bilirubin: 0.6 mg/dL (ref 0.3–1.2)
Total Protein: 7.6 g/dL (ref 6.5–8.1)

## 2022-02-14 LAB — GLUCOSE, CAPILLARY
Glucose-Capillary: 88 mg/dL (ref 70–99)
Glucose-Capillary: 100 mg/dL — ABNORMAL HIGH (ref 70–99)
Glucose-Capillary: 96 mg/dL (ref 70–99)
Glucose-Capillary: 81 mg/dL (ref 70–99)

## 2022-02-14 LAB — RESP PANEL BY RT-PCR (FLU A&B, COVID) ARPGX2
Influenza A by PCR: NEGATIVE
Influenza B by PCR: NEGATIVE
SARS Coronavirus 2 by RT PCR: NEGATIVE

## 2022-02-14 LAB — PROTIME-INR
INR: 1.1 (ref 0.8–1.2)
Prothrombin Time: 14.1 seconds (ref 11.4–15.2)

## 2022-02-14 LAB — LIPASE, BLOOD: Lipase: 36 U/L (ref 11–51)

## 2022-02-14 LAB — HIV ANTIBODY (ROUTINE TESTING W REFLEX): HIV Screen 4th Generation wRfx: NONREACTIVE

## 2022-02-14 SURGERY — REPAIR, RUPTURE, GLOBE
Anesthesia: General | Site: Mouth | Laterality: Right

## 2022-02-14 MED ORDER — DOCUSATE SODIUM 283 MG RE ENEM
1.0000 | ENEMA | RECTAL | Status: DC | PRN
  Filled 2022-02-14: qty 1

## 2022-02-14 MED ORDER — DONEPEZIL HCL 10 MG PO TABS
10.0000 mg | ORAL_TABLET | Freq: Every morning | ORAL | Status: DC
  Administered 2022-02-15 – 2022-02-18 (×4): 10 mg via ORAL
  Filled 2022-02-14 (×5): qty 1

## 2022-02-14 MED ORDER — PHENYLEPHRINE 40 MCG/ML (10ML) SYRINGE FOR IV PUSH (FOR BLOOD PRESSURE SUPPORT)
PREFILLED_SYRINGE | INTRAVENOUS | Status: DC | PRN
  Administered 2022-02-14 (×4): 80 ug via INTRAVENOUS

## 2022-02-14 MED ORDER — BUPIVACAINE HCL (PF) 0.75 % IJ SOLN
INTRAMUSCULAR | Status: AC
  Filled 2022-02-14: qty 10

## 2022-02-14 MED ORDER — OXYCODONE HCL 5 MG PO TABS: 5.0000 mg | ORAL_TABLET | Freq: Once | ORAL | Status: DC | PRN

## 2022-02-14 MED ORDER — POLYVINYL ALCOHOL 1.4 % OP SOLN
1.0000 [drp] | OPHTHALMIC | Status: DC | PRN
  Filled 2022-02-14: qty 15

## 2022-02-14 MED ORDER — ROCURONIUM BROMIDE 10 MG/ML (PF) SYRINGE
PREFILLED_SYRINGE | INTRAVENOUS | Status: AC
  Filled 2022-02-14: qty 10

## 2022-02-14 MED ORDER — ALTEPLASE 2 MG IJ SOLR
2.0000 mg | Freq: Once | INTRAMUSCULAR | Status: DC | PRN
  Filled 2022-02-14: qty 2

## 2022-02-14 MED ORDER — ONDANSETRON HCL 4 MG/2ML IJ SOLN
INTRAMUSCULAR | Status: AC
  Filled 2022-02-14: qty 2

## 2022-02-14 MED ORDER — ALBUMIN HUMAN 5 % IV SOLN: INTRAVENOUS | Status: DC | PRN

## 2022-02-14 MED ORDER — CAMPHOR-MENTHOL 0.5-0.5 % EX LOTN
1.0000 "application " | TOPICAL_LOTION | Freq: Three times a day (TID) | CUTANEOUS | Status: DC | PRN
  Administered 2022-02-16: 1 via TOPICAL
  Filled 2022-02-14: qty 222

## 2022-02-14 MED ORDER — SODIUM HYALURONATE 10 MG/ML IO SOLUTION
PREFILLED_SYRINGE | INTRAOCULAR | Status: DC | PRN
  Administered 2022-02-14: .85 mL via INTRAOCULAR

## 2022-02-14 MED ORDER — CHLORHEXIDINE GLUCONATE CLOTH 2 % EX PADS
6.0000 | MEDICATED_PAD | Freq: Every day | CUTANEOUS | Status: DC
  Administered 2022-02-15 – 2022-02-18 (×4): 6 via TOPICAL

## 2022-02-14 MED ORDER — LATANOPROST 0.005 % OP SOLN
1.0000 [drp] | Freq: Every day | OPHTHALMIC | Status: DC
  Administered 2022-02-14 – 2022-02-17 (×4): 1 [drp] via OPHTHALMIC
  Filled 2022-02-14: qty 2.5

## 2022-02-14 MED ORDER — CLOPIDOGREL BISULFATE 75 MG PO TABS
75.0000 mg | ORAL_TABLET | Freq: Every day | ORAL | Status: DC
  Administered 2022-02-15 – 2022-02-18 (×4): 75 mg via ORAL
  Filled 2022-02-14 (×5): qty 1

## 2022-02-14 MED ORDER — NEOMYCIN-POLYMYXIN-DEXAMETH 3.5-10000-0.1 OP SUSP
1.0000 [drp] | Freq: Four times a day (QID) | OPHTHALMIC | Status: DC
Start: 2022-02-14 — End: 2022-02-18
  Administered 2022-02-14 – 2022-02-18 (×15): 1 [drp] via OPHTHALMIC
  Filled 2022-02-14 (×2): qty 5

## 2022-02-14 MED ORDER — CALCITRIOL 0.5 MCG PO CAPS
1.0000 ug | ORAL_CAPSULE | ORAL | Status: DC
  Administered 2022-02-16: 1 ug via ORAL
  Filled 2022-02-14 (×2): qty 2

## 2022-02-14 MED ORDER — PROPOFOL 10 MG/ML IV BOLUS
INTRAVENOUS | Status: DC | PRN
  Administered 2022-02-14: 90 mg via INTRAVENOUS

## 2022-02-14 MED ORDER — SODIUM CHLORIDE 0.9 % IV SOLN: 100.0000 mL | INTRAVENOUS | Status: DC | PRN

## 2022-02-14 MED ORDER — OXYCODONE HCL 5 MG/5ML PO SOLN: 5.0000 mg | Freq: Once | ORAL | Status: DC | PRN

## 2022-02-14 MED ORDER — ONDANSETRON HCL 4 MG PO TABS: 4.0000 mg | ORAL_TABLET | Freq: Four times a day (QID) | ORAL | Status: DC | PRN

## 2022-02-14 MED ORDER — BSS IO SOLN
INTRAOCULAR | Status: DC | PRN
  Administered 2022-02-14: 15 mL via INTRAOCULAR

## 2022-02-14 MED ORDER — PHENYLEPHRINE HCL-NACL 20-0.9 MG/250ML-% IV SOLN
INTRAVENOUS | Status: DC | PRN
  Administered 2022-02-14: 100 ug/min via INTRAVENOUS

## 2022-02-14 MED ORDER — FAMOTIDINE 20 MG PO TABS
10.0000 mg | ORAL_TABLET | Freq: Every day | ORAL | Status: DC
  Administered 2022-02-14 – 2022-02-18 (×5): 10 mg via ORAL
  Filled 2022-02-14 (×5): qty 1

## 2022-02-14 MED ORDER — LATANOPROST 0.005 % OP SOLN
1.0000 [drp] | Freq: Every day | OPHTHALMIC | Status: DC
  Filled 2022-02-14: qty 2.5

## 2022-02-14 MED ORDER — MEPERIDINE HCL 25 MG/ML IJ SOLN: 6.2500 mg | INTRAMUSCULAR | Status: DC | PRN

## 2022-02-14 MED ORDER — GABAPENTIN 100 MG PO CAPS
200.0000 mg | ORAL_CAPSULE | Freq: Three times a day (TID) | ORAL | Status: DC
  Administered 2022-02-14 – 2022-02-18 (×13): 200 mg via ORAL
  Filled 2022-02-14 (×13): qty 2

## 2022-02-14 MED ORDER — BSS PLUS IO SOLN
INTRAOCULAR | Status: AC
  Filled 2022-02-14: qty 500

## 2022-02-14 MED ORDER — HYDROXYZINE HCL 25 MG PO TABS: 25.0000 mg | ORAL_TABLET | Freq: Three times a day (TID) | ORAL | Status: DC | PRN

## 2022-02-14 MED ORDER — BRIMONIDINE TARTRATE 0.2 % OP SOLN
1.0000 [drp] | Freq: Two times a day (BID) | OPHTHALMIC | Status: DC
  Filled 2022-02-14: qty 5

## 2022-02-14 MED ORDER — LEVOFLOXACIN IN D5W 750 MG/150ML IV SOLN
750.0000 mg | Freq: Once | INTRAVENOUS | Status: AC
  Administered 2022-02-14: 750 mg via INTRAVENOUS
  Filled 2022-02-14: qty 150

## 2022-02-14 MED ORDER — CALCIUM ACETATE (PHOS BINDER) 667 MG PO CAPS
1334.0000 mg | ORAL_CAPSULE | Freq: Three times a day (TID) | ORAL | Status: DC
  Administered 2022-02-15 – 2022-02-18 (×9): 1334 mg via ORAL
  Filled 2022-02-14 (×11): qty 2

## 2022-02-14 MED ORDER — NEPRO/CARBSTEADY PO LIQD: 237.0000 mL | Freq: Three times a day (TID) | ORAL | Status: DC | PRN

## 2022-02-14 MED ORDER — SORBITOL 70 % SOLN
30.0000 mL | Status: DC | PRN
  Administered 2022-02-16: 30 mL via ORAL
  Filled 2022-02-14 (×2): qty 30

## 2022-02-14 MED ORDER — ONDANSETRON HCL 4 MG/2ML IJ SOLN
INTRAMUSCULAR | Status: DC | PRN
  Administered 2022-02-14: 4 mg via INTRAVENOUS

## 2022-02-14 MED ORDER — BRIMONIDINE TARTRATE 0.2 % OP SOLN
1.0000 [drp] | Freq: Two times a day (BID) | OPHTHALMIC | Status: DC
  Administered 2022-02-14 – 2022-02-18 (×8): 1 [drp] via OPHTHALMIC
  Filled 2022-02-14: qty 5

## 2022-02-14 MED ORDER — EPINEPHRINE PF 1 MG/ML IJ SOLN
INTRAMUSCULAR | Status: AC
  Filled 2022-02-14: qty 1

## 2022-02-14 MED ORDER — ONDANSETRON HCL 4 MG/2ML IJ SOLN: 4.0000 mg | Freq: Four times a day (QID) | INTRAMUSCULAR | Status: DC | PRN

## 2022-02-14 MED ORDER — FENTANYL CITRATE (PF) 250 MCG/5ML IJ SOLN: INTRAMUSCULAR | Status: DC | PRN

## 2022-02-14 MED ORDER — PROPOFOL 10 MG/ML IV BOLUS
INTRAVENOUS | Status: AC
  Filled 2022-02-14: qty 20

## 2022-02-14 MED ORDER — LIDOCAINE-PRILOCAINE 2.5-2.5 % EX CREA
1.0000 "application " | TOPICAL_CREAM | CUTANEOUS | Status: DC | PRN
  Filled 2022-02-14: qty 5

## 2022-02-14 MED ORDER — SUGAMMADEX SODIUM 200 MG/2ML IV SOLN
INTRAVENOUS | Status: DC | PRN
  Administered 2022-02-14: 100 mg via INTRAVENOUS

## 2022-02-14 MED ORDER — DORZOLAMIDE HCL-TIMOLOL MAL 2-0.5 % OP SOLN
1.0000 [drp] | Freq: Two times a day (BID) | OPHTHALMIC | Status: DC
  Administered 2022-02-14 – 2022-02-18 (×8): 1 [drp] via OPHTHALMIC
  Filled 2022-02-14 (×2): qty 10

## 2022-02-14 MED ORDER — OXYCODONE HCL 5 MG PO TABS
10.0000 mg | ORAL_TABLET | Freq: Four times a day (QID) | ORAL | Status: DC
  Administered 2022-02-14 – 2022-02-17 (×12): 10 mg via ORAL
  Filled 2022-02-14 (×14): qty 2

## 2022-02-14 MED ORDER — SUCCINYLCHOLINE CHLORIDE 200 MG/10ML IV SOSY
PREFILLED_SYRINGE | INTRAVENOUS | Status: AC
  Filled 2022-02-14: qty 10

## 2022-02-14 MED ORDER — BACITRACIN-POLYMYXIN B 500-10000 UNIT/GM OP OINT
TOPICAL_OINTMENT | OPHTHALMIC | Status: DC | PRN
  Administered 2022-02-14: 1 via OPHTHALMIC

## 2022-02-14 MED ORDER — ATORVASTATIN CALCIUM 10 MG PO TABS
20.0000 mg | ORAL_TABLET | Freq: Every day | ORAL | Status: DC
  Administered 2022-02-14 – 2022-02-18 (×5): 20 mg via ORAL
  Filled 2022-02-14 (×5): qty 2

## 2022-02-14 MED ORDER — MIDODRINE HCL 5 MG PO TABS
10.0000 mg | ORAL_TABLET | ORAL | Status: DC
  Administered 2022-02-16: 10 mg via ORAL
  Filled 2022-02-14: qty 2

## 2022-02-14 MED ORDER — INSULIN ASPART 100 UNIT/ML IJ SOLN
0.0000 [IU] | Freq: Three times a day (TID) | INTRAMUSCULAR | Status: DC
  Administered 2022-02-15 – 2022-02-16 (×2): 1 [IU] via SUBCUTANEOUS

## 2022-02-14 MED ORDER — ZOLPIDEM TARTRATE 5 MG PO TABS
5.0000 mg | ORAL_TABLET | Freq: Every evening | ORAL | Status: DC | PRN
  Administered 2022-02-14 – 2022-02-16 (×2): 5 mg via ORAL
  Filled 2022-02-14 (×3): qty 1

## 2022-02-14 MED ORDER — CALCIUM CARBONATE ANTACID 500 MG PO CHEW
2000.0000 mg | CHEWABLE_TABLET | Freq: Every morning | ORAL | Status: DC
  Filled 2022-02-14: qty 4

## 2022-02-14 MED ORDER — SODIUM CHLORIDE 0.9 % IV SOLN: INTRAVENOUS | Status: DC | PRN

## 2022-02-14 MED ORDER — PROMETHAZINE HCL 25 MG/ML IJ SOLN: 6.2500 mg | INTRAMUSCULAR | Status: DC | PRN

## 2022-02-14 MED ORDER — SODIUM HYALURONATE 10 MG/ML IO SOLUTION
PREFILLED_SYRINGE | INTRAOCULAR | Status: AC
  Filled 2022-02-14: qty 0.85

## 2022-02-14 MED ORDER — LIDOCAINE HCL (PF) 1 % IJ SOLN
5.0000 mL | INTRAMUSCULAR | Status: DC | PRN
  Filled 2022-02-14: qty 5

## 2022-02-14 MED ORDER — LORATADINE 10 MG PO TABS
10.0000 mg | ORAL_TABLET | Freq: Every morning | ORAL | Status: DC
  Administered 2022-02-15 – 2022-02-18 (×3): 10 mg via ORAL
  Filled 2022-02-14 (×4): qty 1

## 2022-02-14 MED ORDER — BACITRACIN-POLYMYXIN B 500-10000 UNIT/GM OP OINT
TOPICAL_OINTMENT | OPHTHALMIC | Status: AC
  Filled 2022-02-14: qty 3.5

## 2022-02-14 MED ORDER — INSULIN GLARGINE-YFGN 100 UNIT/ML ~~LOC~~ SOLN
10.0000 [IU] | Freq: Every day | SUBCUTANEOUS | Status: DC
  Administered 2022-02-14: 10 [IU] via SUBCUTANEOUS
  Filled 2022-02-14 (×2): qty 0.1

## 2022-02-14 MED ORDER — FLUORESCEIN SODIUM 1 MG OP STRP
1.0000 | ORAL_STRIP | Freq: Once | OPHTHALMIC | Status: AC
  Administered 2022-02-14: 1 via OPHTHALMIC
  Filled 2022-02-14: qty 1

## 2022-02-14 MED ORDER — TETANUS-DIPHTH-ACELL PERTUSSIS 5-2.5-18.5 LF-MCG/0.5 IM SUSY: 0.5000 mL | PREFILLED_SYRINGE | Freq: Once | INTRAMUSCULAR | Status: DC

## 2022-02-14 MED ORDER — CALCIUM CARBONATE ANTACID 1250 MG/5ML PO SUSP
500.0000 mg | Freq: Four times a day (QID) | ORAL | Status: DC | PRN
  Filled 2022-02-14: qty 5

## 2022-02-14 MED ORDER — MIDAZOLAM HCL 2 MG/2ML IJ SOLN
INTRAMUSCULAR | Status: AC
  Filled 2022-02-14: qty 2

## 2022-02-14 MED ORDER — ROCURONIUM BROMIDE 10 MG/ML (PF) SYRINGE
PREFILLED_SYRINGE | INTRAVENOUS | Status: DC | PRN
Start: 2022-02-14 — End: 2022-02-14
  Administered 2022-02-14: 40 mg via INTRAVENOUS

## 2022-02-14 MED ORDER — BSS IO SOLN
INTRAOCULAR | Status: AC
  Filled 2022-02-14: qty 15

## 2022-02-14 MED ORDER — SUCCINYLCHOLINE CHLORIDE 200 MG/10ML IV SOSY
PREFILLED_SYRINGE | INTRAVENOUS | Status: DC | PRN
  Administered 2022-02-14: 180 mg via INTRAVENOUS

## 2022-02-14 MED ORDER — ASPIRIN 81 MG PO CHEW
81.0000 mg | CHEWABLE_TABLET | Freq: Every day | ORAL | Status: DC
  Administered 2022-02-15 – 2022-02-18 (×4): 81 mg via ORAL
  Filled 2022-02-14 (×4): qty 1

## 2022-02-14 MED ORDER — PANTOPRAZOLE SODIUM 40 MG PO TBEC
40.0000 mg | DELAYED_RELEASE_TABLET | Freq: Every day | ORAL | Status: DC
  Administered 2022-02-14 – 2022-02-18 (×5): 40 mg via ORAL
  Filled 2022-02-14 (×5): qty 1

## 2022-02-14 MED ORDER — HEPARIN SODIUM (PORCINE) 1000 UNIT/ML DIALYSIS
1000.0000 [IU] | INTRAMUSCULAR | Status: DC | PRN
  Administered 2022-02-15: 1000 [IU] via INTRAVENOUS_CENTRAL
  Filled 2022-02-14 (×3): qty 1

## 2022-02-14 MED ORDER — PHENYLEPHRINE HCL-NACL 20-0.9 MG/250ML-% IV SOLN
INTRAVENOUS | Status: AC
  Filled 2022-02-14: qty 250

## 2022-02-14 MED ORDER — PHENYLEPHRINE 40 MCG/ML (10ML) SYRINGE FOR IV PUSH (FOR BLOOD PRESSURE SUPPORT)
PREFILLED_SYRINGE | INTRAVENOUS | Status: AC
  Filled 2022-02-14: qty 10

## 2022-02-14 MED ORDER — PENTAFLUOROPROP-TETRAFLUOROETH EX AERO: 1.0000 "application " | INHALATION_SPRAY | CUTANEOUS | Status: DC | PRN

## 2022-02-14 MED ORDER — FENTANYL CITRATE (PF) 250 MCG/5ML IJ SOLN
INTRAMUSCULAR | Status: AC
  Filled 2022-02-14: qty 5

## 2022-02-14 MED ORDER — ATROPINE SULFATE 1 % OP SOLN
1.0000 [drp] | Freq: Every day | OPHTHALMIC | Status: DC
  Administered 2022-02-14 – 2022-02-18 (×5): 1 [drp] via OPHTHALMIC
  Filled 2022-02-14: qty 2

## 2022-02-14 MED ORDER — MIDODRINE HCL 5 MG PO TABS
10.0000 mg | ORAL_TABLET | Freq: Every day | ORAL | Status: DC | PRN
  Administered 2022-02-15: 10 mg via ORAL
  Filled 2022-02-14: qty 2

## 2022-02-14 MED ORDER — BSS IO SOLN
INTRAOCULAR | Status: AC
  Filled 2022-02-14: qty 30

## 2022-02-14 MED ORDER — HYDROMORPHONE HCL 1 MG/ML IJ SOLN
0.5000 mg | Freq: Once | INTRAMUSCULAR | Status: AC
  Administered 2022-02-14: 0.5 mg via INTRAVENOUS
  Filled 2022-02-14: qty 1

## 2022-02-14 MED ORDER — FENTANYL CITRATE (PF) 100 MCG/2ML IJ SOLN: 25.0000 ug | INTRAMUSCULAR | Status: DC | PRN

## 2022-02-14 MED ORDER — MIDAZOLAM HCL 2 MG/2ML IJ SOLN: 0.5000 mg | Freq: Once | INTRAMUSCULAR | Status: DC | PRN

## 2022-02-14 MED ORDER — TETRACAINE HCL 0.5 % OP SOLN
OPHTHALMIC | Status: AC
  Filled 2022-02-14: qty 4

## 2022-02-14 MED ORDER — CALCIUM CARBONATE ANTACID 500 MG PO CHEW
2000.0000 mg | CHEWABLE_TABLET | Freq: Every morning | ORAL | Status: DC
  Administered 2022-02-15 – 2022-02-18 (×4): 2000 mg via ORAL
  Filled 2022-02-14 (×2): qty 10

## 2022-02-14 MED ORDER — HEPARIN SODIUM (PORCINE) 1000 UNIT/ML IJ SOLN
2.4000 mL | Freq: Once | INTRAMUSCULAR | Status: AC
  Administered 2022-02-14: 2400 [IU]
  Filled 2022-02-14: qty 2.4

## 2022-02-14 SURGICAL SUPPLY — 35 items
APL SRG 3 HI ABS STRL LF PLS (MISCELLANEOUS)
APPLICATOR DR MATTHEWS STRL (MISCELLANEOUS) IMPLANT
BAG COUNTER SPONGE SURGICOUNT (BAG) ×3 IMPLANT
BAG ISL DRAPE 18X18 STRL (DRAPES)
BAG ISOLATION DRAPE 18X18 (DRAPES) IMPLANT
BAG SPNG CNTER NS LX DISP (BAG) ×2
BAG SURGICOUNT SPONGE COUNTING (BAG) ×1
BLADE EYE CATARACT 19 1.4 BEAV (BLADE) IMPLANT
BLADE MVR KNIFE 20G (BLADE) IMPLANT
CANNULA ANTERIOR CHAMBER 27GA (MISCELLANEOUS) ×2 IMPLANT
CAUTERY EYE LOW TEMP 1300F FIN (OPHTHALMIC RELATED) IMPLANT
COAGULATOR SUCT 6 FR SWTCH (ELECTROSURGICAL) ×1
COAGULATOR SUCT SWTCH 10FR 6 (ELECTROSURGICAL) ×1 IMPLANT
CORD BIPOLAR FORCEPS 12FT (ELECTRODE) ×4 IMPLANT
COVER SURGICAL LIGHT HANDLE (MISCELLANEOUS) ×4 IMPLANT
DRAPE ISOLATION BAG 18X18 (DRAPES)
DRAPE OPHTHALMIC 40X48 W POUCH (DRAPES) ×4 IMPLANT
ERASER HMR WETFIELD 23G BP (MISCELLANEOUS) IMPLANT
GLOVE SURG ENC MOIS LTX SZ7.5 (GLOVE) ×4 IMPLANT
GOWN STRL REUS W/ TWL LRG LVL3 (GOWN DISPOSABLE) ×2 IMPLANT
GOWN STRL REUS W/TWL LRG LVL3 (GOWN DISPOSABLE) ×4
KIT BASIN OR (CUSTOM PROCEDURE TRAY) ×4 IMPLANT
LENS BIOM SUPER VIEW SET DISP (MISCELLANEOUS) IMPLANT
NS IRRIG 1000ML POUR BTL (IV SOLUTION) ×4 IMPLANT
PACK CATARACT CUSTOM (CUSTOM PROCEDURE TRAY) ×4 IMPLANT
PAD ARMBOARD 7.5X6 YLW CONV (MISCELLANEOUS) ×8 IMPLANT
ROLLS DENTAL (MISCELLANEOUS) IMPLANT
SPEAR EYE SURG WECK-CEL (MISCELLANEOUS) IMPLANT
SPECIMEN JAR SMALL (MISCELLANEOUS) IMPLANT
SUT CHROMIC 3 0 SH 27 (SUTURE) ×2 IMPLANT
SUT ETHILON 10 0 CS140 6 (SUTURE) ×4 IMPLANT
SUT ETHILON 9 0 TG140 8 (SUTURE) ×2 IMPLANT
SUT VICRYL 8 0 TG140 8 (SUTURE) ×2 IMPLANT
TOWEL GREEN STERILE FF (TOWEL DISPOSABLE) ×8 IMPLANT
WATER STERILE IRR 1000ML POUR (IV SOLUTION) ×4 IMPLANT

## 2022-02-14 NOTE — H&P (Signed)
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 chronic C1 fracture and was not wearing C-collar at the time of the issue (see that issue) -Appears to have L elbow hematoma, imaging of B elbows negative for fracture -Will observe overnight on med surg -Trauma surgery has consulted but does not have ongoing issues to follow currently  Rupture of globe of eye following blunt trauma, initial encounter- (present on admission) -Traumatic scleral laceration, repaired by Dr. Katy Fitch in the OR -Patient to keep eye patched until this evening.  -Can start combination steroid/antibiotic drop (e.g. Maxitrol or Tobradex equivalent) QID beginning at 20:00 tonight. -Dr. Katy Fitch will see the patient very early AM tomorrow (~0500) for bedside exam  C1 cervical fracture ([place])- (present on admission) -Known C-spine fracture,  not in collar at time of fall -CT C-spine showed un-healed comminuted C1 ring fracture with ligamentous laxity/subluxation and neurosurgical consultation was recommended -Seen by Dr. 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  Old C1 fracture that appears stable." -It is unclear whether surgery will be needed -For now, maintain C-collar  PVD (peripheral vascular disease) ([place])- (present on admission) -s/p R BKA, wearing prosthetic -LLE has evidence of poor circulation on exam but no frank/concerning ulcerations currently  Chronic systolic CHF (congestive heart failure) ([place])- (present on admission) -EF was 45-50% on echo in  12/2020 -Appears to be compensated currently -Volume control with HD  Diabetes mellitus type 2 in nonobese (HCC) -Recent A1c was 7.6 -hold Trulicity -Continue glargine -Cover with very sensitive-scale SSI   ESRD (end stage renal disease) ([place])- (present on admission) -Patient on chronic TTS HD -Nephrology prn order set utilized -He does not appear to be volume overloaded or otherwise in need of acute HD -Nephrology notified that patient will need HD     Advance Care Planning:   Code Status: Full Code   Consults: Trauma surgery; ophthalmology; ENT; nephrology; neurosurgery; TOC team  DVT Prophylaxis: SCDs  Family Communication: None present  Severity of Illness: The appropriate patient status for this patient is OBSERVATION. Observation status is judged to be reasonable and necessary in order to provide the required intensity of service to ensure the patient's safety. The patient's presenting symptoms, physical exam findings, and initial radiographic and laboratory data in the context of their medical condition is felt to place them at decreased risk for further clinical deterioration. Furthermore, it is anticipated that the patient  will be medically stable for discharge from the hospital within 2 midnights of admission.   Author: Karmen Bongo, MD 02/14/2022 5:16 PM  For on call review [email or web address].
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
## 2022-02-14 NOTE — Op Note (Signed)
Preop/postop diagnosis: Lip laceration Procedure: Closure of upper lip laceration Anesthesia: General Estimated blood loss approximately 5 cc Indications: This was a intraoperative emergent consultation for intraoral bleeding.  Anesthesia was uncomfortable extubating the patient because of blood in the mouth.  They asked for an evaluation and identification of the source.  The patient was brought emergently to the operating room for a ruptured globe and ophthalmology just finished the repair of this injury.  There is no other history of at this time as the patient is asleep and intubated and this would be considered emergency consent. Procedure: Patient was in the operating room intubated with a lot of clot in the mouth.  This was suctioned out.  Once the clot was removed bleeding source was easily identified which was the right upper lipon the mucosal side.  This had active bleeding.  Suction cautery was used to control it and then it was closed with interrupted 3-0 chromic.  The lip was very swollen.  This stopped the bleeding.  Examination of the oral cavity oropharynx did not reveal any other sources.  The patient has extremely poor dentition.  Tongue look normal.  Uvula slightly enlarged but not edematous.  ET tube and NG tube are in place.  Patient was then awakened brought to recovery in stable condition counts correct

## 2022-02-14 NOTE — Assessment & Plan Note (Signed)
-  Recent A1c was 7.6 -hold Trulicity -Continue glargine -Cover with very sensitive-scale SSI
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
## 2022-02-14 NOTE — Plan of Care (Signed)

## 2022-02-14 NOTE — ED Notes (Signed)
Pt to OR.

## 2022-02-14 NOTE — Assessment & Plan Note (Addendum)
-  s/p R BKA, wearing prosthetic -LLE has evidence of poor circulation on exam but no frank/concerning ulcerations currently

## 2022-02-14 NOTE — Assessment & Plan Note (Signed)
-  Traumatic scleral laceration, repaired by Dr. Katy Fitch in the OR -Patient to keep eye patched until this evening.  -Can start combination steroid/antibiotic drop (e.g. Maxitrol or Tobradex equivalent) QID beginning at 20:00 tonight. -Dr. Katy Fitch will see the patient very early AM tomorrow (~0500) for bedside exam
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
## 2022-02-14 NOTE — Assessment & Plan Note (Signed)
-  EF was 45-50% on echo in 12/2020 -Appears to be compensated currently -Volume control with HD

## 2022-02-14 NOTE — Anesthesia Postprocedure Evaluation (Signed)
Anesthesia Post Note  Patient: Jose Robinson  Procedure(s) Performed: REPAIR OF RUPTURED GLOBE RIGHT EYE (Right: Eye) CLOSURE OF LIP LACERATION (Mouth)     Patient location during evaluation: PACU Anesthesia Type: General Level of consciousness: awake and alert, patient cooperative and oriented Pain management: pain level controlled Vital Signs Assessment: post-procedure vital signs reviewed and stable Respiratory status: spontaneous breathing, nonlabored ventilation and respiratory function stable Cardiovascular status: blood pressure returned to baseline and stable Postop Assessment: no apparent nausea or vomiting Anesthetic complications: no   No notable events documented.  Last Vitals:  Vitals:   02/14/22 1115 02/14/22 1130  BP: (!) 154/68 (!) 146/64  Pulse: 73 75  Resp: 14 17  Temp:  36.5 C  SpO2: 96% 96%    Last Pain:  Vitals:   02/14/22 1130  TempSrc:   PainSc: Asleep                 Caliegh Middlekauff,E. Wadie Liew

## 2022-02-14 NOTE — Transfer of Care (Signed)
Immediate Anesthesia Transfer of Care Note  Patient: Jose Robinson  Procedure(s) Performed: REPAIR OF RUPTURED GLOBE RIGHT EYE (Right: Eye) CLOSURE OF LIP LACERATION (Mouth)  Patient Location: PACU  Anesthesia Type:General  Level of Consciousness: drowsy, patient cooperative and responds to stimulation  Airway & Oxygen Therapy: Patient Spontanous Breathing and Patient connected to face mask oxygen  Post-op Assessment: Report given to RN, Post -op Vital signs reviewed and stable and Patient moving all extremities X 4  Post vital signs: Reviewed and stable  Last Vitals:  Vitals Value Taken Time  BP 161/45 02/14/22 0959  Temp    Pulse    Resp 17 02/14/22 1000  SpO2    Vitals shown include unvalidated device data.  Last Pain:  Vitals:   02/14/22 0715  TempSrc: Axillary  PainSc:          Complications: No notable events documented.
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
## 2022-02-14 NOTE — Assessment & Plan Note (Signed)
-  Known C-spine fracture, not in collar at time of fall -CT C-spine showed un-healed comminuted C1 ring fracture with ligamentous laxity/subluxation and neurosurgical consultation was recommended -Seen by Dr. 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  Old C1 fracture that appears stable." -It is unclear whether surgery will be needed -For now, maintain C-collar

## 2022-02-14 NOTE — Assessment & Plan Note (Signed)
-  Patient on chronic TTS HD -Nephrology prn order set utilized -He does not appear to be volume overloaded or otherwise in need of acute HD -Nephrology notified that patient will need HD

## 2022-02-14 NOTE — Progress Notes (Signed)
Pt arrived from PACU with C-Collar.  Iv on left FA infiltrated.  Removed per protocol.  HD catheter dressing not in tact with fluids attached to one lumen.  IV consult ordered for eval.  Pt has multiple ulcers on fingers, elbows, knees and foot. Mepiplex added to all wounds.  Pt has amputated pinky fingers bilateral.  Pt has right BKA.  Elbows are both swollen.  Right hand is swollen anteriorly like a pocket of liquid underneath the skin.  Pt has 2 ulcers on right knee and 3 small ulcers on left knee.  Healed ulcer on top of left foot.  Dry Diaper on pt.  Dried blood on gauze to right groin.  Removed and cleansed.  Skin intact. Pt resting.  Will continue to monitor.

## 2022-02-14 NOTE — TOC CAGE-AID Note (Signed)
Transition of Care [facility]) - CAGE-AID Screening   Patient Details  Name: Jose Robinson MRN: [id number] Date of Birth: July 10, 1956  Transition of Care [facility]) CM/SW Contact:    Gwendlyn Hanback C Tarpley-Carter, [place] Phone Number: 02/14/2022, 12:42 PM   Clinical Narrative: Pt is unable to participate in Cage Aid. Pt is currently in surgery.  CSW will assess at a better time.  [name], MSW, LCSW-A Pronouns:  She/Her/Hers [place] Transitions of Care Clinical Social Worker Direct Number:  [phone] [email or web address]  CAGE-AID Screening: Substance Abuse Screening unable to be completed due to: : Patient unable to participate             Substance Abuse Education Offered: No

## 2022-02-14 NOTE — Anesthesia Procedure Notes (Signed)
Procedure Name: Intubation Date/Time: 02/14/2022 7:52 AM Performed by: Michele Rockers, CRNA Pre-anesthesia Checklist: Patient identified, Patient being monitored, Timeout performed, Emergency Drugs available and Suction available Patient Re-evaluated:Patient Re-evaluated prior to induction Oxygen Delivery Method: Circle System Utilized Preoxygenation: Pre-oxygenation with 100% oxygen Induction Type: IV induction and Rapid sequence Laryngoscope Size: Mac, 3 and Glidescope Grade View: Grade I Tube type: Oral Tube size: 7.5 mm Number of attempts: 1 Airway Equipment and Method: Rigid stylet and Video-laryngoscopy Placement Confirmation: ETT inserted through vocal cords under direct vision, positive ETCO2 and breath sounds checked- equal and bilateral Secured at: 22 cm Tube secured with: Tape Dental Injury: Teeth and Oropharynx as per pre-operative assessment  Comments: Performed by Dr Glennon Mac.

## 2022-02-14 NOTE — Anesthesia Preprocedure Evaluation (Addendum)
Anesthesia Evaluation  Patient identified by MRN, date of birth, ID band Patient awake    Reviewed: Allergy & Precautions, NPO status , Patient's Chart, lab work & pertinent test resultsPreop documentation limited or incomplete due to emergent nature of procedure.  History of Anesthesia Complications Negative for: history of anesthetic complications  Airway Mallampati: Unable to assess   Neck ROM: Limited  Mouth opening: Limited Mouth Opening Comment: Very bloody oropharynx, copious blood and clot suctioned from mouth C-collar in place Dental  (+) Poor Dentition, Missing, Loose, Dental Advisory Given   Pulmonary Current SmokerPatient did not abstain from smoking.,    breath sounds clear to auscultation       Cardiovascular hypertension (on mitodrine now),  Rhythm:Regular Rate:Normal  01/20/2021 ECHO: Abnormal septal motion and posterior lateral hypokinesis. EF by estimation, is 45 to 50%. LV has mildly decreased function, no sig valvular abnormalities   Neuro/Psych CT c-spine: 1. No acute traumatic injury identified in the cervical spine. 2. Consider Neurosurgery consultation: Un-healed comminuted C1 ring fracture with increased distraction of the anterior fracture fragments since November. Associated increased lateral subluxation of the C1 on the C2, but no atlanto-occipital dissociation. 3. Progressed Cervicomedullary Junction and C1 Spinal Stenosis   C-spine not cleared negative psych ROS   GI/Hepatic   Endo/Other  diabetes, Oral Hypoglycemic Agents, Insulin Dependent  Renal/GU Dialysis and ESRFRenal disease (K+ 4.8, last dialyzed 2d ago)     Musculoskeletal   Abdominal   Peds  Hematology xarelto   Anesthesia Other Findings Head trauma: R eye open globe, face and mouth trauma  Reproductive/Obstetrics                            Anesthesia Physical Anesthesia Plan  ASA: 4 and  emergent  Anesthesia Plan: General   Post-op Pain Management: Ofirmev IV (intra-op)*   Induction: Intravenous and Rapid sequence  PONV Risk Score and Plan: 1 and Ondansetron and Dexamethasone  Airway Management Planned: Oral ETT and Video Laryngoscope Planned  Additional Equipment: None  Intra-op Plan:   Post-operative Plan: Possible Post-op intubation/ventilation  Informed Consent: I have reviewed the patients History and Physical, chart, labs and discussed the procedure including the risks, benefits and alternatives for the proposed anesthesia with the patient or authorized representative who has indicated his/her understanding and acceptance.     Dental advisory given and Only emergency history available  Plan Discussed with: CRNA and Surgeon  Anesthesia Plan Comments:         Anesthesia Quick Evaluation

## 2022-02-14 NOTE — Consult Note (Signed)
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 fracture.  No other intraorbital injury. 3. See also Face and Cervical spine CT reported separately. Electronically Signed: By: Genevie Ann M.D. On: 02/14/2022 06:08    ROS: All others negative except those listed in HPI.  Physical Exam: Vitals:   02/14/22 1130 02/14/22 1215 02/14/22 1230 02/14/22 1245  BP: (!) 146/64 (!) 159/55 (!) 159/80 (!) 155/46  Pulse: 75   76  Resp: '17 16 17 17  ' Temp: 97.7 F (36.5 C)  97.7 F (36.5 C)   TempSrc:      SpO2: 96%  96% 96%  Weight:      Height:         General: chronically ill appearing male in NAD Head: eye patch on R eye, upper lip swollen Neck: Supple. No lymphadenopathy Lungs: CTA bilaterally. No wheeze, rales or rhonchi.  Breathing is unlabored. Heart: RRR. No murmur, rubs or gallops.  Abdomen: soft, nontender, +BS, no guarding, no rebound tenderness  Lower extremities:R BKA, LLE trace edema, chronic wounds Neuro: lethargic  Psych:  Responds to questions appropriately with a normal affect. Dialysis Access: [facility]  Dialysis Orders:  TTS - [facility]  4hrs, BFR 400, DFR 500,  EDW 64.7kg, 2K/ 2.5Ca UFP4  Access: TDC  Heparin 4000  Assessment/Plan:  Traumatic ruptured globe/scleral laceration - d/t fall, s/p emergency surgery.  Per PMD/opthalmology   ESRD -  on HD TTS.  Will write orders for HD today per regular schedule.    Hypertension/volume  - BP elevated.  Continue home meds. Does not appear grossly volume overloaded, UF as tolerated.   Anemia of CKD - Hgb 17. No ESA.  Secondary Hyperparathyroidism -  Ca in goal.  Check phos.    Nutrition - Renal diet w/fluid restrictions. Hx C1 fracture - per PMD DMT2 - per PMD  Jen Mow, PA-C [facility] 02/14/2022, 12:51 PM
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
## 2022-02-15 ENCOUNTER — Encounter (HOSPITAL_COMMUNITY): Payer: Self-pay | Admitting: Ophthalmology

## 2022-02-15 ENCOUNTER — Observation Stay (HOSPITAL_COMMUNITY): Payer: Medicare (Managed Care)

## 2022-02-15 DIAGNOSIS — W19XXXA Unspecified fall, initial encounter: Secondary | ICD-10-CM | POA: Diagnosis not present

## 2022-02-15 DIAGNOSIS — E119 Type 2 diabetes mellitus without complications: Secondary | ICD-10-CM | POA: Diagnosis not present

## 2022-02-15 DIAGNOSIS — S12000G Unspecified displaced fracture of first cervical vertebra, subsequent encounter for fracture with delayed healing: Secondary | ICD-10-CM | POA: Diagnosis not present

## 2022-02-15 DIAGNOSIS — S0531XA Ocular laceration without prolapse or loss of intraocular tissue, right eye, initial encounter: Secondary | ICD-10-CM | POA: Diagnosis not present

## 2022-02-15 DIAGNOSIS — N186 End stage renal disease: Secondary | ICD-10-CM | POA: Diagnosis not present

## 2022-02-15 LAB — RENAL FUNCTION PANEL
Albumin: 3 g/dL — ABNORMAL LOW (ref 3.5–5.0)
Anion gap: 15 (ref 5–15)
BUN: 63 mg/dL — ABNORMAL HIGH (ref 8–23)
CO2: 22 mmol/L (ref 22–32)
Calcium: 8 mg/dL — ABNORMAL LOW (ref 8.9–10.3)
Chloride: 94 mmol/L — ABNORMAL LOW (ref 98–111)
Creatinine, Ser: 9.49 mg/dL — ABNORMAL HIGH (ref 0.61–1.24)
GFR, Estimated: 6 mL/min — ABNORMAL LOW (ref >60)
Glucose, Bld: 60 mg/dL — ABNORMAL LOW (ref 70–99)
Phosphorus: 5 mg/dL — ABNORMAL HIGH (ref 2.5–4.6)
Potassium: 5.8 mmol/L — ABNORMAL HIGH (ref 3.5–5.1)
Sodium: 131 mmol/L — ABNORMAL LOW (ref 135–145)

## 2022-02-15 LAB — GLUCOSE, CAPILLARY
Glucose-Capillary: 112 mg/dL — ABNORMAL HIGH (ref 70–99)
Glucose-Capillary: 32 mg/dL — CL (ref 70–99)
Glucose-Capillary: 66 mg/dL — ABNORMAL LOW (ref 70–99)
Glucose-Capillary: 154 mg/dL — ABNORMAL HIGH (ref 70–99)
Glucose-Capillary: 89 mg/dL (ref 70–99)

## 2022-02-15 MED ORDER — DEXTROSE 50 % IV SOLN
INTRAVENOUS | Status: AC
  Administered 2022-02-15: 25 g via INTRAVENOUS
  Filled 2022-02-15: qty 50

## 2022-02-15 MED ORDER — DEXTROSE 50 % IV SOLN: 25.0000 g | INTRAVENOUS | Status: AC

## 2022-02-15 MED ORDER — CHLORHEXIDINE GLUCONATE CLOTH 2 % EX PADS
6.0000 | MEDICATED_PAD | Freq: Every day | CUTANEOUS | Status: DC
  Administered 2022-02-15 – 2022-02-18 (×4): 6 via TOPICAL

## 2022-02-15 NOTE — Progress Notes (Signed)
Hypoglycemic Event  CBG: 39 at 0739  Treatment:  D50 50 mL (25 gm)  Symptoms:  lethargic  Follow-up CBG: Time:0806 CBG Result:112  Possible Reasons for Event: Inadequate meal intake  Comments/MD notified: Hosp made aware.     Bernette Redbird Tudor Chandley
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
## 2022-02-15 NOTE — TOC CAGE-AID Note (Signed)
Transition of Care [facility]) - CAGE-AID Screening   Patient Details  Name: Jose Robinson MRN: [id number] Date of Birth: [birth year]  [facility]) CM/SW Contact:    Gaetano Hawthorne Tarpley-Carter, [employer] Phone Number: 02/15/2022, 3:04 PM   Clinical Narrative: Pt participated in [place].  Pt stated he does not use substance or ETOH.  Pt was not offered resources, due to no usage of substance or ETOH.    Sumire Halbleib Tarpley-Carter, MSW, LCSW-A Pronouns:  She/Her/[place] Transitions of Care Clinical Social Worker Direct Number:  [phone] [email or web address]  CAGE-AID Screening: Substance Abuse Screening unable to be completed due to: : Patient unable to participate  Have You Ever Felt You Ought to Cut Down on Your Drinking or Drug Use?: No Have People Annoyed You By Critizing Your Drinking Or Drug Use?: No Have You Felt Bad Or Guilty About Your Drinking Or Drug Use?: No Have You Ever Had a Drink or Used Drugs First Thing In The Morning to Steady Your Nerves or to Get Rid of a Hangover?: No CAGE-AID Score: 0  Substance Abuse Education Offered: No
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
## 2022-02-15 NOTE — Plan of Care (Signed)

## 2022-02-15 NOTE — Progress Notes (Addendum)
Patient ID: Jose Robinson, male   DOB: 1956-05-02, 66 y.o.   MRN: [id number] Recently from hemodialysis.  I reviewed the MRI of his cervical spine.  The changes that are present all appear old.  There is no clinical evidence of instability in the cervical spine though he does have rather severe stenosis at C3-4 and C6-C7.  He is very high risk for surgical intervention due to multiple comorbidities.  The problem at C3-4 is a result of poor healing from his previous surgery.  As long as he is neurologically stable I do not believe that surgery is urgent or emergent.  In that regard it would be best to treat him conservatively.  The hard cervical collar may be removed.  I will follow-up with him on Monday if he is still hospitalized.  Otherwise an outpatient visit can be arranged.

## 2022-02-15 NOTE — Plan of Care (Signed)
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
## 2022-02-15 NOTE — Progress Notes (Addendum)
CSW attempted to meet with pt, per note, currently not in room. CSW attempted to call daughter [place], no answer, unable to leave message. Lurline Idol, MSW, LCSW 2/17/202311:57 AM

## 2022-02-15 NOTE — Progress Notes (Signed)
PT Cancellation Note  Patient Details Name: Jose Robinson MRN: [id number] DOB: 13-Dec-1956   Cancelled Treatment:    Reason Eval/Treat Not Completed: Patient at procedure or test/unavailable (at HD). Patient at HD. PT will evaluate as schedule allows.  [name], SPT   [name] 02/15/2022, 2:15 PM
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
## 2022-02-16 DIAGNOSIS — S0531XA Ocular laceration without prolapse or loss of intraocular tissue, right eye, initial encounter: Secondary | ICD-10-CM | POA: Diagnosis not present

## 2022-02-16 DIAGNOSIS — W19XXXA Unspecified fall, initial encounter: Secondary | ICD-10-CM | POA: Diagnosis not present

## 2022-02-16 DIAGNOSIS — S12000G Unspecified displaced fracture of first cervical vertebra, subsequent encounter for fracture with delayed healing: Secondary | ICD-10-CM | POA: Diagnosis not present

## 2022-02-16 DIAGNOSIS — N186 End stage renal disease: Secondary | ICD-10-CM | POA: Diagnosis not present

## 2022-02-16 DIAGNOSIS — E119 Type 2 diabetes mellitus without complications: Secondary | ICD-10-CM | POA: Diagnosis not present

## 2022-02-16 LAB — BASIC METABOLIC PANEL
Anion gap: 14 (ref 5–15)
BUN: 30 mg/dL — ABNORMAL HIGH (ref 8–23)
CO2: 22 mmol/L (ref 22–32)
Calcium: 7.4 mg/dL — ABNORMAL LOW (ref 8.9–10.3)
Chloride: 96 mmol/L — ABNORMAL LOW (ref 98–111)
Creatinine, Ser: 6.15 mg/dL — ABNORMAL HIGH (ref 0.61–1.24)
GFR, Estimated: 10 mL/min — ABNORMAL LOW (ref >60)
Glucose, Bld: 113 mg/dL — ABNORMAL HIGH (ref 70–99)
Potassium: 4.7 mmol/L (ref 3.5–5.1)
Sodium: 132 mmol/L — ABNORMAL LOW (ref 135–145)

## 2022-02-16 LAB — GLUCOSE, CAPILLARY
Glucose-Capillary: 96 mg/dL (ref 70–99)
Glucose-Capillary: 66 mg/dL — ABNORMAL LOW (ref 70–99)
Glucose-Capillary: 87 mg/dL (ref 70–99)
Glucose-Capillary: 197 mg/dL — ABNORMAL HIGH (ref 70–99)

## 2022-02-16 LAB — CBC
HCT: 36.7 % — ABNORMAL LOW (ref 39.0–52.0)
Hemoglobin: 11.5 g/dL — ABNORMAL LOW (ref 13.0–17.0)
MCH: 29.1 pg (ref 26.0–34.0)
MCHC: 31.3 g/dL (ref 30.0–36.0)
MCV: 92.9 fL (ref 80.0–100.0)
Platelets: 174 10*3/uL (ref 150–400)
RBC: 3.95 MIL/uL — ABNORMAL LOW (ref 4.22–5.81)
RDW: 16.5 % — ABNORMAL HIGH (ref 11.5–15.5)
WBC: 4.2 10*3/uL (ref 4.0–10.5)
nRBC: 0 % (ref 0.0–0.2)

## 2022-02-16 MED ORDER — ACETAMINOPHEN 325 MG PO TABS
650.0000 mg | ORAL_TABLET | Freq: Four times a day (QID) | ORAL | Status: DC | PRN
  Administered 2022-02-16 – 2022-02-18 (×3): 650 mg via ORAL
  Filled 2022-02-16 (×4): qty 2
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
## 2022-02-16 NOTE — Progress Notes (Signed)
[facility] Progress Note   Subjective:   Reports the entire right side of his body is sore. Denies SOB, CP, palpitations, dizziness and nausea. Tolerated 1.9L UF yesterday but had some hypotension during dialysis.   Objective Vitals:   02/15/22 1549 02/15/22 2017 02/16/22 0402 02/16/22 0734  BP: (!) 126/58 104/83 128/72 123/72  Pulse: 77 87 83 73  Resp: 16  20 18   Temp: (!) 97.5 F (36.4 C) 98.6 F (37 C) 97.6 F (36.4 C) 98.4 F (36.9 C)  TempSrc:  Oral Oral Oral  SpO2:  99% 97% 97%  Weight: 70.3 kg  70.8 kg   Height:       Physical Exam General: WDWN male, alert and in NAD Heart: RRR, no murmurs, rubs or gallops Lungs: CTA bilaterally without wheezing, rhonchi or rales Abdomen: Soft, non-distended, +BS Extremities: R BKA, no edema b/l lower extremities Dialysis Access: [facility]  Additional Objective Labs: Basic Metabolic Panel: Recent Labs  Lab 02/14/22 0531 02/14/22 0547 02/15/22 1225 02/16/22 0639  NA 129* 130* 131* 132*  K 4.8 4.8 5.8* 4.7  CL 92* 98 94* 96*  CO2 18*  --  22 22  GLUCOSE 86 87 60* 113*  BUN 39* 45* 63* 30*  CREATININE 7.77* 8.50* 9.49* 6.15*  CALCIUM 9.1  --  8.0* 7.4*  PHOS  --   --  5.0*  --    Liver Function Tests: Recent Labs  Lab 02/14/22 0531 02/15/22 1225  AST 53*  --   ALT 61*  --   ALKPHOS 97  --   BILITOT 0.6  --   PROT 7.6  --   ALBUMIN 3.5 3.0*   Recent Labs  Lab 02/14/22 0531  LIPASE 36   CBC: Recent Labs  Lab 02/14/22 0531 02/14/22 0547 02/16/22 0639  WBC 3.7*  --  4.2  NEUTROABS 2.2  --   --   HGB 15.1 17.0 11.5*  HCT 47.4 50.0 36.7*  MCV 92.6  --  92.9  PLT 142*  --  174   Blood Culture    Component Value Date/Time   SDES BLOOD RIGHT ANTECUBITAL 04/15/2021 1930   SPECREQUEST  04/15/2021 1930    BOTTLES DRAWN AEROBIC AND ANAEROBIC Blood Culture adequate volume   CULT  04/15/2021 1930    NO GROWTH 5 DAYS Performed at [facility], [address], [place], [place] [address]     REPTSTATUS 04/20/2021 FINAL 04/15/2021 1930    Cardiac Enzymes: No results for input(s): CKTOTAL, CKMB, CKMBINDEX, TROPONINI in the last 168 hours. CBG: Recent Labs  Lab 02/15/22 0806 02/15/22 1411 02/15/22 1628 02/15/22 2115 02/16/22 0823  GLUCAP 112* 66* 154* 89 96   Iron Studies: No results for input(s): IRON, TIBC, TRANSFERRIN, FERRITIN in the last 72 hours. @lablastinr3 @ Studies/Results: DG Shoulder Right  Result Date: 02/15/2022 CLINICAL DATA:  Right shoulder pain after fall. EXAM: RIGHT SHOULDER - 2+ VIEW COMPARISON:  None. FINDINGS: There is no evidence of fracture or dislocation. There is no evidence of arthropathy or other focal bone abnormality. Soft tissues are unremarkable. IMPRESSION: Negative. Electronically Signed   By: Marijo Conception M.D.   On: 02/15/2022 09:33   DG Elbow 2 Views Left  Result Date: 02/14/2022 CLINICAL DATA:  Fall, facial injuries. EXAM: LEFT ELBOW - 2 VIEW COMPARISON:  None. FINDINGS: There is no evidence of fracture, dislocation, or joint effusion. There is no evidence of arthropathy or other focal bone abnormality. Soft tissue swelling about the olecranon process.  Vascular calcifications. IMPRESSION: 1. No acute fracture or dislocation. 2. Soft tissue swelling about the olecranon process, which may represent hematoma/seroma in the settings of trauma or may represent olecranon bursitis, correlate with history of gout/inflammatory arthropathy. Electronically Signed   By: Keane Police D.O.   On: 02/14/2022 14:36   DG Elbow 2 Views Right  Result Date: 02/14/2022 CLINICAL DATA:  Trauma, fall EXAM: RIGHT ELBOW - 2 VIEW COMPARISON:  None FINDINGS: Osseous mineralization low normal. Joint spaces preserved. Obliquity on lateral view. No fracture, dislocation, or bone destruction. No gross joint effusion. Soft tissue swelling dorsally and medially. IMPRESSION: No acute osseous abnormalities. Electronically Signed   By: Lavonia Dana M.D.   On: 02/14/2022 14:32    MR CERVICAL SPINE WO CONTRAST  Result Date: 02/14/2022 CLINICAL DATA:  Acute neck pain with prior fracture EXAM: MRI CERVICAL SPINE WITHOUT CONTRAST TECHNIQUE: Multiplanar, multisequence MR imaging of the cervical spine was performed. No intravenous contrast was administered. COMPARISON:  02/06/2017 cervical spine MRI 11/03/2021, 02/14/2022 cervical spine CT FINDINGS: Alignment: Grade 1 anterolisthesis at C3-4. Grade 1 retrolisthesis at C4-5 and C5-6. Grade 1 anterolisthesis at C6-7. listhesis at the C3-4 and C4-5 levels has clearly worsened since the MRI from 02/06/2017 but is unchanged compared to the CT 11/03/2021. Vertebrae: C4-7 ACDF. Unchanged height loss at C4 compared to 11/03/2021. No acute fracture. Cord: There is deformity of the spinal cord due to mass effect at multiple levels. No definite signal change. Posterior Fossa, vertebral arteries, paraspinal tissues: Negative Disc levels: C1-2: Worsening of ligamentous hypertrophy with moderate spinal canal stenosis. C1-2 alignment is better characterized on the earlier CT. C2-3: Small disc bulge with mild bilateral foraminal stenosis. No spinal canal stenosis. C3-4: Severe spinal canal stenosis primarily due to abnormal alignment is unchanged compared to the prior CT. There is severe narrowing of both neural foramina. C4-5: Moderate spinal canal stenosis due to the dorsal projection of the inferior corner of C4. Severe bilateral foraminal stenosis. C5-6: Unchanged mild spinal canal stenosis.  No neural impingement. C6-7: Severe spinal canal stenosis has progressed since 02/06/2017 and likely since 11/03/2021. There is flattening of the spinal cord. Severe bilateral foraminal stenosis. C7-T1: No spinal canal or neural foraminal stenosis. IMPRESSION: 1. No acute fracture or ligamentous injury of the cervical spine. 2. Severe spinal canal stenosis at C3-4 and C6-7 with flattening of the spinal cord at both levels. C6-7 has progressed since 02/06/2017 and  likely since 11/03/2021. 3. Severe bilateral C3-4, C4-5 and C6-7 neural foraminal stenosis. 4. Worsening of moderate C1-2 spinal canal stenosis. Alignment at C1-2 is better characterized on the earlier CT. Electronically Signed   By: Ulyses Jarred M.D.   On: 02/14/2022 19:39   DG Hand 2 View Right  Result Date: 02/14/2022 CLINICAL DATA:  Trauma, fall EXAM: RIGHT HAND - 2 VIEW COMPARISON:  None. FINDINGS: There is previous disarticulation of right fifth finger. There is evidence of previous partial amputation of tip of distal phalanx of fourth finger. No recent fracture or dislocation is seen. There is soft tissue swelling over the dorsum. There are no radiopaque foreign bodies. IMPRESSION: No recent fracture or dislocation is seen. Other findings as described in the body of the report. Electronically Signed   By: Elmer Picker M.D.   On: 02/14/2022 12:15   DG CHEST PORT 1 VIEW  Result Date: 02/14/2022 CLINICAL DATA:  Screening for metal prior to MRI. EXAM: PORTABLE CHEST 1 VIEW COMPARISON:  11/15/2021 FINDINGS: Central line introduced from a left approach  has its tip is in the SVC just above the right atrium and in the superior right atrium. There are surgical clips projected over the thoracic inlet region on the left. There are bilateral subclavian to axillary region vascular stents. Minimal kinking on the right. Moderate kinking on the left. There is a lower cervical ACDF plate partially visible. Heart size is normal. There is aortic atherosclerotic calcification. The lungs are clear. The vascularity is normal. No effusions. Chronic degenerative change noted in the mid to lower thoracic spine. I do not see any finding that would be expected to present a contraindication to MRI. IMPRESSION: No active cardiopulmonary disease. Multiple inserted/implanted entities as outlined above, none of which would seem to present a contraindication to MRI. Electronically Signed   By: Nelson Chimes M.D.   On:  02/14/2022 16:11   Medications:   aspirin  81 mg Oral Daily   atorvastatin  20 mg Oral Daily   atropine  1 drop Right Eye Daily   brimonidine  1 drop Left Eye BID   calcitRIOL  1 mcg Oral Q T,Th,Sa-HD   calcium acetate  1,334 mg Oral TID WC   calcium carbonate  2,000 mg Oral q AM   Chlorhexidine Gluconate Cloth  6 each Topical Q0600   Chlorhexidine Gluconate Cloth  6 each Topical Q0600   clopidogrel  75 mg Oral Daily   donepezil  10 mg Oral q AM   dorzolamide-timolol  1 drop Right Eye BID   famotidine  10 mg Oral q1600   gabapentin  200 mg Oral TID   insulin aspart  0-6 Units Subcutaneous TID WC   latanoprost  1 drop Left Eye QHS   loratadine  10 mg Oral q AM   midodrine  10 mg Oral Once per day on Sun Tue Thu Sat   neomycin-polymyxin b-dexamethasone  1 drop Right Eye QID   oxyCODONE  10 mg Oral Q6H   pantoprazole  40 mg Oral QAC breakfast   Tdap  0.5 mL Intramuscular Once    Dialysis Orders: TTS - [facility]  4hrs, BFR 400, DFR 500,  EDW 64.7kg, 2K/ 2.5Ca UFP4   Access: TDC  Heparin 4000    Assessment/Plan: Traumatic ruptured globe/scleral laceration - d/t fall, s/p emergency surgery.  Per PMD/opthalmology   ESRD -  on HD TTS.  Had HD off schedule yesterday due to high inpatient census, resuming TTS schedule today.   Hypertension/volume  - BP elevated on admission, did have some hypotension with HD yesterday.  Continue home meds. Remains above outpatietn EDW, Does not appear grossly volume overloaded, UF as tolerated.   Anemia of CKD - Hgb 11.5. No ESA indicated at this time.   Secondary Hyperparathyroidism -  Ca high on admission, now on the low side. Takes calcitriol, calcium acetate and calcium carbonate. Continue to monitor trend. Phos is at goal.  Continue calcium acetate.   Nutrition - Renal diet w/fluid restrictions. Hx C1 fracture - per PMD DMT2 - per PMD  Anice Paganini, PA-C 02/16/2022, 8:34 AM  [facility] Pager: [phone]
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
## 2022-02-16 NOTE — Social Work (Signed)
CSW called pts daughter to discuss DC. Daughter did not answer and CSW was unable to leave message.  Emeterio Reeve, LCSW Clinical Social Worker
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
## 2022-02-16 NOTE — Progress Notes (Addendum)
OT Cancellation Note  Patient Details Name: Jose Robinson MRN: [id number] DOB: 11/30/1956   Cancelled Treatment:     Patient at dialysis, will follow back once on the floor.   Corinne Ports E. Cleverly, OTR/L Acute Rehabilitation Services [phone] [place] 02/16/2022, 11:01 AM

## 2022-02-16 NOTE — Plan of Care (Signed)

## 2022-02-17 DIAGNOSIS — W19XXXA Unspecified fall, initial encounter: Secondary | ICD-10-CM | POA: Diagnosis not present

## 2022-02-17 DIAGNOSIS — E119 Type 2 diabetes mellitus without complications: Secondary | ICD-10-CM | POA: Diagnosis not present

## 2022-02-17 DIAGNOSIS — S12000G Unspecified displaced fracture of first cervical vertebra, subsequent encounter for fracture with delayed healing: Secondary | ICD-10-CM | POA: Diagnosis not present

## 2022-02-17 DIAGNOSIS — N186 End stage renal disease: Secondary | ICD-10-CM | POA: Diagnosis not present

## 2022-02-17 DIAGNOSIS — T80211A Bloodstream infection due to central venous catheter, initial encounter: Secondary | ICD-10-CM | POA: Diagnosis not present

## 2022-02-17 LAB — CBC WITH DIFFERENTIAL/PLATELET
Abs Immature Granulocytes: 0.02 10*3/uL (ref 0.00–0.07)
Basophils Absolute: 0 10*3/uL (ref 0.0–0.1)
Basophils Relative: 0 %
Eosinophils Absolute: 0 10*3/uL (ref 0.0–0.5)
Eosinophils Relative: 1 %
HCT: 40.1 % (ref 39.0–52.0)
Hemoglobin: 12.3 g/dL — ABNORMAL LOW (ref 13.0–17.0)
Immature Granulocytes: 1 %
Lymphocytes Relative: 15 %
Lymphs Abs: 0.6 10*3/uL — ABNORMAL LOW (ref 0.7–4.0)
MCH: 29.1 pg (ref 26.0–34.0)
MCHC: 30.7 g/dL (ref 30.0–36.0)
MCV: 94.8 fL (ref 80.0–100.0)
Monocytes Absolute: 0.9 10*3/uL (ref 0.1–1.0)
Monocytes Relative: 20 %
Neutro Abs: 2.7 10*3/uL (ref 1.7–7.7)
Neutrophils Relative %: 63 %
Platelets: 166 10*3/uL (ref 150–400)
RBC: 4.23 MIL/uL (ref 4.22–5.81)
RDW: 16.2 % — ABNORMAL HIGH (ref 11.5–15.5)
WBC: 4.2 10*3/uL (ref 4.0–10.5)
nRBC: 0 % (ref 0.0–0.2)

## 2022-02-17 LAB — GLUCOSE, CAPILLARY
Glucose-Capillary: 139 mg/dL — ABNORMAL HIGH (ref 70–99)
Glucose-Capillary: 149 mg/dL — ABNORMAL HIGH (ref 70–99)
Glucose-Capillary: 123 mg/dL — ABNORMAL HIGH (ref 70–99)
Glucose-Capillary: 136 mg/dL — ABNORMAL HIGH (ref 70–99)
Glucose-Capillary: 116 mg/dL — ABNORMAL HIGH (ref 70–99)

## 2022-02-17 NOTE — Plan of Care (Signed)

## 2022-02-17 NOTE — Progress Notes (Signed)
Patient is confused and drowsy. Scheduled OxyCodone was held. NP Kennon Holter) made aware.
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
## 2022-02-17 NOTE — Progress Notes (Signed)
[facility] Progress Note   Subjective:   Pt reports he is sore this AM. Denies SOB, CP, palpitations, dizziness, and nausea. Tolerated HD yesterday with net UF 2L.   Objective Vitals:   02/16/22 1300 02/16/22 2040 02/17/22 0408 02/17/22 0500  BP: 119/76 (!) 182/114 (!) 147/84   Pulse: 85 72 76   Resp: 20 18 20    Temp: 98 F (36.7 C) 98.4 F (36.9 C) 98.7 F (37.1 C)   TempSrc: Oral Oral Oral   SpO2:  91% 93%   Weight:    68.2 kg  Height:       Physical Exam General: alert and in NAD. Eye shield in place.  Heart: RRR, no murmurs, rubs or gallops Lungs: CTA bilaterally without wheezing, rhonchi or rales Abdomen: Soft, non-distended, +BS Extremities: R BKA, no edema b/l lower extremities Dialysis Access: [facility]  Additional Objective Labs: Basic Metabolic Panel: Recent Labs  Lab 02/14/22 0531 02/14/22 0547 02/15/22 1225 02/16/22 0639  NA 129* 130* 131* 132*  K 4.8 4.8 5.8* 4.7  CL 92* 98 94* 96*  CO2 18*  --  22 22  GLUCOSE 86 87 60* 113*  BUN 39* 45* 63* 30*  CREATININE 7.77* 8.50* 9.49* 6.15*  CALCIUM 9.1  --  8.0* 7.4*  PHOS  --   --  5.0*  --    Liver Function Tests: Recent Labs  Lab 02/14/22 0531 02/15/22 1225  AST 53*  --   ALT 61*  --   ALKPHOS 97  --   BILITOT 0.6  --   PROT 7.6  --   ALBUMIN 3.5 3.0*   Recent Labs  Lab 02/14/22 0531  LIPASE 36   CBC: Recent Labs  Lab 02/14/22 0531 02/14/22 0547 02/16/22 0639  WBC 3.7*  --  4.2  NEUTROABS 2.2  --   --   HGB 15.1 17.0 11.5*  HCT 47.4 50.0 36.7*  MCV 92.6  --  92.9  PLT 142*  --  174   Blood Culture    Component Value Date/Time   SDES BLOOD RIGHT ANTECUBITAL 04/15/2021 1930   SPECREQUEST  04/15/2021 1930    BOTTLES DRAWN AEROBIC AND ANAEROBIC Blood Culture adequate volume   CULT  04/15/2021 1930    NO GROWTH 5 DAYS Performed at [facility], [place] [address], [place],  [address]    REPTSTATUS 04/20/2021 FINAL 04/15/2021 1930    Cardiac Enzymes: No  results for input(s): CKTOTAL, CKMB, CKMBINDEX, TROPONINI in the last 168 hours. CBG: Recent Labs  Lab 02/16/22 1246 02/16/22 1349 02/16/22 1628 02/17/22 0646 02/17/22 0735  GLUCAP 66* 87 197* 139* 149*   Iron Studies: No results for input(s): IRON, TIBC, TRANSFERRIN, FERRITIN in the last 72 hours. @lablastinr3 @ Studies/Results: DG Shoulder Right  Result Date: 02/15/2022 CLINICAL DATA:  Right shoulder pain after fall. EXAM: RIGHT SHOULDER - 2+ VIEW COMPARISON:  None. FINDINGS: There is no evidence of fracture or dislocation. There is no evidence of arthropathy or other focal bone abnormality. Soft tissues are unremarkable. IMPRESSION: Negative. Electronically Signed   By: Marijo Conception M.D.   On: 02/15/2022 09:33   Medications:   aspirin  81 mg Oral Daily   atorvastatin  20 mg Oral Daily   atropine  1 drop Right Eye Daily   brimonidine  1 drop Left Eye BID   calcitRIOL  1 mcg Oral Q T,Th,Sa-HD   calcium acetate  1,334 mg Oral TID WC   calcium carbonate  2,000  mg Oral q AM   Chlorhexidine Gluconate Cloth  6 each Topical Q0600   Chlorhexidine Gluconate Cloth  6 each Topical Q0600   clopidogrel  75 mg Oral Daily   donepezil  10 mg Oral q AM   dorzolamide-timolol  1 drop Right Eye BID   famotidine  10 mg Oral q1600   gabapentin  200 mg Oral TID   insulin aspart  0-6 Units Subcutaneous TID WC   latanoprost  1 drop Left Eye QHS   loratadine  10 mg Oral q AM   midodrine  10 mg Oral Once per day on Sun Tue Thu Sat   neomycin-polymyxin b-dexamethasone  1 drop Right Eye QID   oxyCODONE  10 mg Oral Q6H   pantoprazole  40 mg Oral QAC breakfast   Tdap  0.5 mL Intramuscular Once    Dialysis Orders: TTS - [facility]  4hrs, BFR 400, DFR 500,  EDW 64.7kg, 2K/ 2.5Ca UFP4   Access: TDC  Heparin 4000    Assessment/Plan: Traumatic ruptured globe/scleral laceration - d/t fall, s/p emergency surgery.  Per PMD/opthalmology   ESRD -  on HD TTS.  Had HD here Friday and Saturday. No emergent  indications for HD today, will plan for next HD on Tuesday.   Hypertension/volume  - BP elevated on admission. Continue home meds. Remains above outpatietn EDW, Does not appear grossly volume overloaded, UF as tolerated.   Anemia of CKD - Hgb 11.5. No ESA indicated at this time.   Secondary Hyperparathyroidism -  Ca high on admission, now on the low side. Takes calcitriol, calcium acetate and calcium carbonate. Continue to monitor trend. Phos is at goal.  Continue calcium acetate.   Nutrition - Renal diet w/fluid restrictions. Hx C1 fracture - per PMD DMT2 - per PMD    Anice Paganini, PA-C 02/17/2022, 9:12 AM  [facility] Pager: [phone]

## 2022-02-18 DIAGNOSIS — S12000G Unspecified displaced fracture of first cervical vertebra, subsequent encounter for fracture with delayed healing: Secondary | ICD-10-CM | POA: Diagnosis not present

## 2022-02-18 DIAGNOSIS — N186 End stage renal disease: Secondary | ICD-10-CM | POA: Diagnosis not present

## 2022-02-18 DIAGNOSIS — E119 Type 2 diabetes mellitus without complications: Secondary | ICD-10-CM | POA: Diagnosis not present

## 2022-02-18 DIAGNOSIS — I5022 Chronic systolic (congestive) heart failure: Secondary | ICD-10-CM | POA: Diagnosis not present

## 2022-02-18 LAB — RENAL FUNCTION PANEL
Albumin: 3.1 g/dL — ABNORMAL LOW (ref 3.5–5.0)
Anion gap: 16 — ABNORMAL HIGH (ref 5–15)
BUN: 34 mg/dL — ABNORMAL HIGH (ref 8–23)
CO2: 20 mmol/L — ABNORMAL LOW (ref 22–32)
Calcium: 8.9 mg/dL (ref 8.9–10.3)
Chloride: 93 mmol/L — ABNORMAL LOW (ref 98–111)
Creatinine, Ser: 7.1 mg/dL — ABNORMAL HIGH (ref 0.61–1.24)
GFR, Estimated: 8 mL/min — ABNORMAL LOW (ref >60)
Glucose, Bld: 100 mg/dL — ABNORMAL HIGH (ref 70–99)
Phosphorus: 4.2 mg/dL (ref 2.5–4.6)
Potassium: 5.8 mmol/L — ABNORMAL HIGH (ref 3.5–5.1)
Sodium: 129 mmol/L — ABNORMAL LOW (ref 135–145)

## 2022-02-18 LAB — GLUCOSE, CAPILLARY
Glucose-Capillary: 102 mg/dL — ABNORMAL HIGH (ref 70–99)
Glucose-Capillary: 85 mg/dL (ref 70–99)
Glucose-Capillary: 83 mg/dL (ref 70–99)

## 2022-02-18 LAB — HEPATITIS B SURFACE ANTIBODY,QUALITATIVE: Hep B S Ab: REACTIVE — AB

## 2022-02-18 LAB — HEPATITIS B SURFACE ANTIGEN: Hepatitis B Surface Ag: NONREACTIVE

## 2022-02-18 MED ORDER — SODIUM ZIRCONIUM CYCLOSILICATE 10 G PO PACK
10.0000 g | PACK | Freq: Once | ORAL | Status: AC
  Administered 2022-02-18: 10 g via ORAL
  Filled 2022-02-18: qty 1

## 2022-02-18 MED ORDER — ATROPINE SULFATE 1 % OP SOLN: 1.0000 [drp] | Freq: Every day | OPHTHALMIC | 0 refills | Status: AC

## 2022-02-18 MED ORDER — DORZOLAMIDE HCL-TIMOLOL MAL 2-0.5 % OP SOLN: 1.0000 [drp] | Freq: Two times a day (BID) | OPHTHALMIC | 0 refills | Status: AC

## 2022-02-18 MED ORDER — OXYCODONE HCL 10 MG PO TABS: 10.0000 mg | ORAL_TABLET | Freq: Four times a day (QID) | ORAL | 0 refills | Status: AC

## 2022-02-18 MED ORDER — NEOMYCIN-POLYMYXIN-DEXAMETH 3.5-10000-0.1 OP SUSP: 1.0000 [drp] | Freq: Four times a day (QID) | OPHTHALMIC | 0 refills | Status: AC

## 2022-02-18 NOTE — NC FL2 (Signed)
[place] LEVEL OF CARE SCREENING TOOL     IDENTIFICATION  Patient Name: Jose Robinson Birthdate: [birth year] Sex: male Admission Date (Current Location): 02/14/2022  [place] and [place] Number:  Jose Robinson [id number] Jose Robinson and Address:  [facility] [place], [place] [address] [place], [place] [address]      Provider Number: [id number]  Attending Physician Name and Address:  Jose Cheney, MD  Relative Name and Phone Number:  Jose Robinson   [phone]    Current Level of Care: Hospital Recommended Level of Care: [place] Prior Approval Number:    Date Approved/Denied:   PASRR Number:    Discharge Plan: Other (Comment) (ALF)    Current Diagnoses: Patient Active Problem List   Diagnosis Date Noted   Rupture of globe of eye following blunt trauma, initial encounter 02/14/2022   Fall at nursing home 02/14/2022   C1 cervical fracture ([place]) 02/14/2022   PVD (peripheral vascular disease) ([place]) 41/93/7902   Acute metabolic encephalopathy 40/97/3532   Diabetes mellitus type 2 in nonobese ([place]) 04/16/2021   Diabetic nephropathy ([place]) 99/24/2683   Chronic systolic CHF (congestive heart failure) ([place]) 04/16/2021   Hypoglycemia 04/15/2021   Hyperglycemia 04/15/2021   DKA (diabetic ketoacidosis) ([place]) 01/24/2021   COVID-19 virus infection 01/24/2021   Syncope 01/24/2021   ESRD (end stage renal disease) ([place]) 01/24/2021   Congestive heart failure (CHF) ([place]) 01/24/2021   Hypotension 01/24/2021   Shock ([place]) 01/24/2021    Orientation RESPIRATION BLADDER Height & Weight     Self, Time, Situation  Normal Continent Weight: 150 lb 9.2 oz (68.3 kg) Height:  5\' 11"  (180.3 cm)  BEHAVIORAL SYMPTOMS/MOOD NEUROLOGICAL BOWEL NUTRITION STATUS      Continent Diet (see discharge summary)  AMBULATORY STATUS COMMUNICATION OF NEEDS Skin   Extensive Assist Verbally Other (Comment) (scattered wounds)                        Personal Care Assistance Level of Assistance  Bathing, Feeding, Dressing Bathing Assistance: Maximum assistance Feeding assistance: Limited assistance Dressing Assistance: Maximum assistance     Functional Limitations Info  Sight, Hearing, Speech Sight Info: Impaired Hearing Info: Adequate Speech Info: Adequate    SPECIAL CARE FACTORS FREQUENCY                       Contractures Contractures Info: Not present    Additional Factors Info  Code Status, Allergies Code Status Info: full Allergies Info: Acetaminophen, Prednisone, Ivp Dye (Iodinated Contrast Media)           Current Medications (02/18/2022):  This is the current hospital active medication list Current Facility-Administered Medications  Medication Dose Route Frequency Provider Last Rate Last Admin   acetaminophen (TYLENOL) tablet 650 mg  650 mg Oral Q6H PRN Jose Cheney, MD   650 mg at 02/18/22 0636   aspirin chewable tablet 81 mg  81 mg Oral Daily Karmen Bongo, MD   81 mg at 02/18/22 1125   atorvastatin (LIPITOR) tablet 20 mg  20 mg Oral Daily Karmen Bongo, MD   20 mg at 02/18/22 1125   atropine 1 % ophthalmic solution 1 drop  1 drop Right Eye Daily Debbra Riding, MD   1 drop at 02/18/22 1126   brimonidine (ALPHAGAN) 0.2 % ophthalmic solution 1 drop  1 drop Left Eye BID Karmen Bongo, MD   1 drop at 02/18/22 0511   calcitRIOL (ROCALTROL) capsule 1 mcg  1 mcg Oral Q T,Th,Sa-HD Karmen Bongo, MD   1 mcg at 02/16/22 1028   calcium acetate (PHOSLO) capsule 1,334 mg  1,334 mg Oral TID WC Karmen Bongo, MD   1,334 mg at 02/18/22 0847   calcium carbonate (dosed in mg elemental calcium) suspension 500 mg of elemental calcium  500 mg of elemental calcium Oral Q6H PRN Karmen Bongo, MD       calcium carbonate (TUMS - dosed in mg elemental calcium) chewable tablet 2,000 mg  2,000 mg Oral q AM Karmen Bongo, MD   2,000 mg at 02/18/22 3825   camphor-menthol (SARNA) lotion 1 application  1  application Topical K5L PRN Karmen Bongo, MD   1 application at 97/67/34 1313   And   hydrOXYzine (ATARAX) tablet 25 mg  25 mg Oral Q8H PRN Karmen Bongo, MD       Chlorhexidine Gluconate Cloth 2 % PADS 6 each  6 each Topical Q0600 Penninger, Ria Comment, PA   6 each at 02/18/22 0513   Chlorhexidine Gluconate Cloth 2 % PADS 6 each  6 each Topical Q0600 Janalee Dane, PA-C   6 each at 02/18/22 1937   clopidogrel (PLAVIX) tablet 75 mg  75 mg Oral Daily Karmen Bongo, MD   75 mg at 02/18/22 1125   docusate sodium (ENEMEEZ) enema 283 mg  1 enema Rectal PRN Karmen Bongo, MD       donepezil (ARICEPT) tablet 10 mg  10 mg Oral q AM Karmen Bongo, MD   10 mg at 02/18/22 0847   dorzolamide-timolol (COSOPT) 22.3-6.8 MG/ML ophthalmic solution 1 drop  1 drop Right Eye BID Debbra Riding, MD   1 drop at 02/18/22 1126   famotidine (PEPCID) tablet 10 mg  10 mg Oral q1600 Karmen Bongo, MD   10 mg at 02/17/22 1616   feeding supplement (NEPRO CARB STEADY) liquid 237 mL  237 mL Oral TID PRN Karmen Bongo, MD       gabapentin (NEURONTIN) capsule 200 mg  200 mg Oral TID Karmen Bongo, MD   200 mg at 02/18/22 0847   insulin aspart (novoLOG) injection 0-6 Units  0-6 Units Subcutaneous TID WC Karmen Bongo, MD   1 Units at 02/16/22 1725   latanoprost (XALATAN) 0.005 % ophthalmic solution 1 drop  1 drop Left Eye Ivery Quale, MD   1 drop at 02/17/22 2143   loratadine (CLARITIN) tablet 10 mg  10 mg Oral q AM Karmen Bongo, MD   10 mg at 02/18/22 1125   midodrine (PROAMATINE) tablet 10 mg  10 mg Oral Once per day on Sun Tue Thu Sat Karmen Bongo, MD   10 mg at 02/16/22 0845   midodrine (PROAMATINE) tablet 10 mg  10 mg Oral Daily PRN Karmen Bongo, MD   10 mg at 02/15/22 1441   neomycin-polymyxin b-dexamethasone (MAXITROL) ophthalmic suspension 1 drop  1 drop Right Eye QID Karmen Bongo, MD   1 drop at 02/18/22 1126   ondansetron (ZOFRAN) tablet 4 mg  4 mg Oral Q6H PRN Karmen Bongo, MD       Or   ondansetron [facility]) injection 4 mg  4 mg Intravenous Q6H PRN Karmen Bongo, MD       oxyCODONE (Oxy IR/ROXICODONE) immediate release tablet 10 mg  10 mg Oral Q6H Karmen Bongo, MD   10 mg at 02/17/22 1826   pantoprazole (PROTONIX) EC tablet 40 mg  40 mg Oral QAC breakfast Karmen Bongo, MD   40 mg at 02/18/22  6599   polyvinyl alcohol (LIQUIFILM TEARS) 1.4 % ophthalmic solution 1 drop  1 drop Both Eyes Q1H PRN Karmen Bongo, MD       sodium zirconium cyclosilicate (LOKELMA) packet 10 g  10 g Oral Once Jose Cheney, MD       sorbitol 70 % solution 30 mL  30 mL Oral PRN Karmen Bongo, MD   30 mL at 02/16/22 1312   Tdap (BOOSTRIX) injection 0.5 mL  0.5 mL Intramuscular Once Karmen Bongo, MD       zolpidem Lorrin Mais) tablet 5 mg  5 mg Oral QHS PRN Karmen Bongo, MD   5 mg at 02/16/22 2107     Discharge Medications: Please see discharge summary for a list of discharge medications.  Relevant Imaging Results:  Relevant Lab Results:   Additional Information HD TTS at [facility] -SSN [id number]  Joanne Chars, LCSW

## 2022-02-18 NOTE — TOC Transition Note (Signed)
Transition of Care [facility]) - CM/SW Discharge Note   Patient Details  Name: Jose Robinson MRN: [id number] Date of Birth: 1956/02/15  [facility] CM/SW Contact:  Joanne Chars, LCSW Phone Number: 02/18/2022, 3:20 PM   Clinical Narrative:   Pt discharging to [facility].  Pt will have Advanced HH services at that facility.  RN call report to [phone]    Final next level of care: Assisted Living Barriers to Discharge: Barriers Resolved   Patient Goals and CMS Choice        Discharge Placement              Patient chooses bed at:  ([facility]) Patient to be transferred to facility by: [place] Name of family member notified: daughter [place] Patient and family notified of of transfer: 02/18/22  Discharge Plan and Services In-house Referral: Clinical Social Work   Post Acute Care Choice:  (TBD)                    HH Arranged: PT, OT, RN [place] Agency: [place] ([place]) Date HH Agency Contacted: 02/18/22 Time [place]: 1520 Representative spoke with at [place]: [place] ([place]) Interventions     Readmission Risk Interventions No flowsheet data found.

## 2022-02-18 NOTE — Progress Notes (Addendum)
Pt receives out-pt HD at [facility] on TTS. Pt arrives at 6:15 for 6:35 chair time. Pt's out-pt schedule provided to CSW. Will assist as needed.   Melven Sartorius Renal Navigator [phone]   Addendum at 1:36 pm: Pt to return to AL today. Contacted [place] and spoke to [place]. Clinic aware pt to d/c today and will resume care tomorrow.
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
## 2022-02-18 NOTE — Progress Notes (Signed)
Patient's scheduled OxyCodone was not given. Patient is confused and trying to climb out of bed. NP Kennon Holter) made aware.

## 2022-02-18 NOTE — Progress Notes (Signed)
[facility] Progress Note   Subjective:  Security outside of his room when I arrived, was argumentative/combative this morning, but appears has been settled. When I entered his room, he was pleasant and eating his breakfast. Denied CP/dyspnea.  Objective Vitals:   02/17/22 1303 02/17/22 1931 02/18/22 0500 02/18/22 0820  BP: (!) 145/88 128/82  (!) 149/78  Pulse: 72 78  70  Resp:  18  18  Temp: 98.6 F (37 C) 98.7 F (37.1 C)  97.8 F (36.6 C)  TempSrc: Oral Oral  Oral  SpO2: 100% 95%    Weight:   68.3 kg   Height:       Physical Exam General: Ill appearing man, NAD. Calm. R eye bandaged. Heart: RRR; no murmur Lungs: CTAB Abdomen: soft Extremities: R BKA, no edema Dialysis Access: [facility]  Additional Objective Labs: Basic Metabolic Panel: Recent Labs  Lab 02/15/22 1225 02/16/22 0639 02/18/22 0237  NA 131* 132* 129*  K 5.8* 4.7 5.8*  CL 94* 96* 93*  CO2 22 22 20*  GLUCOSE 60* 113* 100*  BUN 63* 30* 34*  CREATININE 9.49* 6.15* 7.10*  CALCIUM 8.0* 7.4* 8.9  PHOS 5.0*  --  4.2   Liver Function Tests: Recent Labs  Lab 02/14/22 0531 02/15/22 1225 02/18/22 0237  AST 53*  --   --   ALT 61*  --   --   ALKPHOS 97  --   --   BILITOT 0.6  --   --   PROT 7.6  --   --   ALBUMIN 3.5 3.0* 3.1*   Recent Labs  Lab 02/14/22 0531  LIPASE 36   CBC: Recent Labs  Lab 02/14/22 0531 02/14/22 0547 02/16/22 0639 02/17/22 0909  WBC 3.7*  --  4.2 4.2  NEUTROABS 2.2  --   --  2.7  HGB 15.1 17.0 11.5* 12.3*  HCT 47.4 50.0 36.7* 40.1  MCV 92.6  --  92.9 94.8  PLT 142*  --  174 166   Medications:   aspirin  81 mg Oral Daily   atorvastatin  20 mg Oral Daily   atropine  1 drop Right Eye Daily   brimonidine  1 drop Left Eye BID   calcitRIOL  1 mcg Oral Q T,Th,Sa-HD   calcium acetate  1,334 mg Oral TID WC   calcium carbonate  2,000 mg Oral q AM   Chlorhexidine Gluconate Cloth  6 each Topical Q0600   Chlorhexidine Gluconate Cloth  6 each Topical Q0600    clopidogrel  75 mg Oral Daily   donepezil  10 mg Oral q AM   dorzolamide-timolol  1 drop Right Eye BID   famotidine  10 mg Oral q1600   gabapentin  200 mg Oral TID   insulin aspart  0-6 Units Subcutaneous TID WC   latanoprost  1 drop Left Eye QHS   loratadine  10 mg Oral q AM   midodrine  10 mg Oral Once per day on Sun Tue Thu Sat   neomycin-polymyxin b-dexamethasone  1 drop Right Eye QID   oxyCODONE  10 mg Oral Q6H   pantoprazole  40 mg Oral QAC breakfast   Tdap  0.5 mL Intramuscular Once    Dialysis Orders: TTS - NW 4hrs, 400/500, EDW 64.7kg, 2K/ 2.5Ca, UFP4, TDC, Heparin 4000   Assessment/Plan: Traumatic ruptured globe/scleral laceration - d/t fall, s/p emergency surgery.  Per PMD/opthalmology   ESRD -  Back to usual TTS schedule -> next tomorrow. If combative behaviors  return, will need sitter to come with him for HD.   Hypertension/volume  - BP elevated on admission, better today.  Anemia of CKD - Hgb 12.3, no ESA indicated at this time.   Secondary Hyperparathyroidism -  Ca high on admission, now on the low side. Takes calcitriol, calcium acetate and calcium carbonate. Continue to monitor trend. Phos is at goal.  Continue calcium acetate.   Nutrition - Renal diet w/fluid restrictions. Hx C1 fracture - per PMD DMT2 - per PMD    Veneta Penton, PA-C 02/18/2022, 11:04 AM  [facility]

## 2022-02-19 LAB — HEPATITIS B SURFACE ANTIBODY, QUANTITATIVE: Hep B S AB Quant (Post): 15.4 m[IU]/mL (ref >9.9)

## 2022-02-20 ENCOUNTER — Emergency Department (HOSPITAL_COMMUNITY): Payer: Medicare (Managed Care)

## 2022-02-20 ENCOUNTER — Other Ambulatory Visit: Payer: Self-pay

## 2022-02-20 ENCOUNTER — Inpatient Hospital Stay (HOSPITAL_COMMUNITY): Payer: Medicare (Managed Care)

## 2022-02-20 ENCOUNTER — Inpatient Hospital Stay (HOSPITAL_COMMUNITY)
Admission: EM | Admit: 2022-02-20 | Discharge: 2022-02-27 | DRG: 314 | Disposition: E | Payer: Medicare (Managed Care) | Attending: Pulmonary Disease | Admitting: Pulmonary Disease

## 2022-02-20 DIAGNOSIS — Y838 Other surgical procedures as the cause of abnormal reaction of the patient, or of later complication, without mention of misadventure at the time of the procedure: Secondary | ICD-10-CM | POA: Diagnosis present

## 2022-02-20 DIAGNOSIS — E871 Hypo-osmolality and hyponatremia: Secondary | ICD-10-CM | POA: Diagnosis present

## 2022-02-20 DIAGNOSIS — A419 Sepsis, unspecified organism: Secondary | ICD-10-CM | POA: Diagnosis present

## 2022-02-20 DIAGNOSIS — E1151 Type 2 diabetes mellitus with diabetic peripheral angiopathy without gangrene: Secondary | ICD-10-CM | POA: Diagnosis present

## 2022-02-20 DIAGNOSIS — D631 Anemia in chronic kidney disease: Secondary | ICD-10-CM | POA: Diagnosis present

## 2022-02-20 DIAGNOSIS — J9601 Acute respiratory failure with hypoxia: Secondary | ICD-10-CM | POA: Diagnosis not present

## 2022-02-20 DIAGNOSIS — Z20822 Contact with and (suspected) exposure to covid-19: Secondary | ICD-10-CM | POA: Diagnosis present

## 2022-02-20 DIAGNOSIS — Z01818 Encounter for other preprocedural examination: Secondary | ICD-10-CM

## 2022-02-20 DIAGNOSIS — I5022 Chronic systolic (congestive) heart failure: Secondary | ICD-10-CM | POA: Diagnosis present

## 2022-02-20 DIAGNOSIS — Z992 Dependence on renal dialysis: Secondary | ICD-10-CM | POA: Diagnosis not present

## 2022-02-20 DIAGNOSIS — G9341 Metabolic encephalopathy: Secondary | ICD-10-CM | POA: Diagnosis present

## 2022-02-20 DIAGNOSIS — G9340 Encephalopathy, unspecified: Secondary | ICD-10-CM | POA: Diagnosis present

## 2022-02-20 DIAGNOSIS — Z794 Long term (current) use of insulin: Secondary | ICD-10-CM

## 2022-02-20 DIAGNOSIS — F17210 Nicotine dependence, cigarettes, uncomplicated: Secondary | ICD-10-CM | POA: Diagnosis present

## 2022-02-20 DIAGNOSIS — E11649 Type 2 diabetes mellitus with hypoglycemia without coma: Secondary | ICD-10-CM | POA: Diagnosis present

## 2022-02-20 DIAGNOSIS — S0531XD Ocular laceration without prolapse or loss of intraocular tissue, right eye, subsequent encounter: Secondary | ICD-10-CM

## 2022-02-20 DIAGNOSIS — I959 Hypotension, unspecified: Secondary | ICD-10-CM | POA: Diagnosis not present

## 2022-02-20 DIAGNOSIS — N2581 Secondary hyperparathyroidism of renal origin: Secondary | ICD-10-CM | POA: Diagnosis present

## 2022-02-20 DIAGNOSIS — R7989 Other specified abnormal findings of blood chemistry: Secondary | ICD-10-CM | POA: Diagnosis present

## 2022-02-20 DIAGNOSIS — I132 Hypertensive heart and chronic kidney disease with heart failure and with stage 5 chronic kidney disease, or end stage renal disease: Secondary | ICD-10-CM | POA: Diagnosis present

## 2022-02-20 DIAGNOSIS — E1122 Type 2 diabetes mellitus with diabetic chronic kidney disease: Secondary | ICD-10-CM | POA: Diagnosis present

## 2022-02-20 DIAGNOSIS — T80211A Bloodstream infection due to central venous catheter, initial encounter: Principal | ICD-10-CM | POA: Diagnosis present

## 2022-02-20 DIAGNOSIS — Z7902 Long term (current) use of antithrombotics/antiplatelets: Secondary | ICD-10-CM

## 2022-02-20 DIAGNOSIS — Z66 Do not resuscitate: Secondary | ICD-10-CM | POA: Diagnosis not present

## 2022-02-20 DIAGNOSIS — S12000D Unspecified displaced fracture of first cervical vertebra, subsequent encounter for fracture with routine healing: Secondary | ICD-10-CM

## 2022-02-20 DIAGNOSIS — Z91041 Radiographic dye allergy status: Secondary | ICD-10-CM

## 2022-02-20 DIAGNOSIS — Z89511 Acquired absence of right leg below knee: Secondary | ICD-10-CM

## 2022-02-20 DIAGNOSIS — Z7985 Long-term (current) use of injectable non-insulin antidiabetic drugs: Secondary | ICD-10-CM

## 2022-02-20 DIAGNOSIS — K7200 Acute and subacute hepatic failure without coma: Secondary | ICD-10-CM | POA: Diagnosis present

## 2022-02-20 DIAGNOSIS — K529 Noninfective gastroenteritis and colitis, unspecified: Secondary | ICD-10-CM | POA: Diagnosis present

## 2022-02-20 DIAGNOSIS — I5021 Acute systolic (congestive) heart failure: Secondary | ICD-10-CM

## 2022-02-20 DIAGNOSIS — N186 End stage renal disease: Secondary | ICD-10-CM

## 2022-02-20 DIAGNOSIS — R6521 Severe sepsis with septic shock: Secondary | ICD-10-CM | POA: Diagnosis present

## 2022-02-20 DIAGNOSIS — Z79899 Other long term (current) drug therapy: Secondary | ICD-10-CM

## 2022-02-20 DIAGNOSIS — Z95828 Presence of other vascular implants and grafts: Secondary | ICD-10-CM

## 2022-02-20 DIAGNOSIS — W19XXXD Unspecified fall, subsequent encounter: Secondary | ICD-10-CM | POA: Diagnosis present

## 2022-02-20 DIAGNOSIS — R4182 Altered mental status, unspecified: Secondary | ICD-10-CM

## 2022-02-20 DIAGNOSIS — Z888 Allergy status to other drugs, medicaments and biological substances status: Secondary | ICD-10-CM

## 2022-02-20 LAB — ECHOCARDIOGRAM COMPLETE
AR max vel: 2.2 cm2
AV Area VTI: 2.19 cm2
AV Area mean vel: 1.84 cm2
AV Mean grad: 2 mmHg
AV Peak grad: 3.4 mmHg
Ao pk vel: 0.92 m/s
Area-P 1/2: 4.63 cm2
S' Lateral: 3.1 cm
Weight: 2455.04 oz

## 2022-02-20 LAB — I-STAT ARTERIAL BLOOD GAS, ED
Acid-base deficit: 4 mmol/L — ABNORMAL HIGH (ref 0.0–2.0)
Bicarbonate: 19.9 mmol/L — ABNORMAL LOW (ref 20.0–28.0)
Calcium, Ion: 1.19 mmol/L (ref 1.15–1.40)
HCT: 41 % (ref 39.0–52.0)
Hemoglobin: 13.9 g/dL (ref 13.0–17.0)
O2 Saturation: 95 %
Patient temperature: 97.5
Potassium: 3.7 mmol/L (ref 3.5–5.1)
Sodium: 131 mmol/L — ABNORMAL LOW (ref 135–145)
TCO2: 21 mmol/L — ABNORMAL LOW (ref 22–32)
pCO2 arterial: 30.7 mmHg — ABNORMAL LOW (ref 32–48)
pH, Arterial: 7.416 (ref 7.35–7.45)
pO2, Arterial: 71 mmHg — ABNORMAL LOW (ref 83–108)

## 2022-02-20 LAB — AMMONIA: Ammonia: 39 umol/L — ABNORMAL HIGH (ref 9–35)

## 2022-02-20 LAB — CBC WITH DIFFERENTIAL/PLATELET
Abs Immature Granulocytes: 0.07 10*3/uL (ref 0.00–0.07)
Basophils Absolute: 0 10*3/uL (ref 0.0–0.1)
Basophils Relative: 1 %
Eosinophils Absolute: 0 10*3/uL (ref 0.0–0.5)
Eosinophils Relative: 0 %
HCT: 38.4 % — ABNORMAL LOW (ref 39.0–52.0)
Hemoglobin: 11.9 g/dL — ABNORMAL LOW (ref 13.0–17.0)
Immature Granulocytes: 2 %
Lymphocytes Relative: 2 %
Lymphs Abs: 0.1 10*3/uL — ABNORMAL LOW (ref 0.7–4.0)
MCH: 28.9 pg (ref 26.0–34.0)
MCHC: 31 g/dL (ref 30.0–36.0)
MCV: 93.2 fL (ref 80.0–100.0)
Monocytes Absolute: 0.5 10*3/uL (ref 0.1–1.0)
Monocytes Relative: 11 %
Neutro Abs: 3.5 10*3/uL (ref 1.7–7.7)
Neutrophils Relative %: 84 %
Platelets: 154 10*3/uL (ref 150–400)
RBC: 4.12 MIL/uL — ABNORMAL LOW (ref 4.22–5.81)
RDW: 15.1 % (ref 11.5–15.5)
WBC: 4.1 10*3/uL (ref 4.0–10.5)
nRBC: 0 % (ref 0.0–0.2)

## 2022-02-20 LAB — COMPREHENSIVE METABOLIC PANEL
ALT: 51 U/L — ABNORMAL HIGH (ref 0–44)
AST: 96 U/L — ABNORMAL HIGH (ref 15–41)
Albumin: 2.2 g/dL — ABNORMAL LOW (ref 3.5–5.0)
Alkaline Phosphatase: 109 U/L (ref 38–126)
Anion gap: 13 (ref 5–15)
BUN: 37 mg/dL — ABNORMAL HIGH (ref 8–23)
CO2: 22 mmol/L (ref 22–32)
Calcium: 8.6 mg/dL — ABNORMAL LOW (ref 8.9–10.3)
Chloride: 99 mmol/L (ref 98–111)
Creatinine, Ser: 6.46 mg/dL — ABNORMAL HIGH (ref 0.61–1.24)
GFR, Estimated: 9 mL/min — ABNORMAL LOW (ref >60)
Glucose, Bld: 101 mg/dL — ABNORMAL HIGH (ref 70–99)
Potassium: 3.9 mmol/L (ref 3.5–5.1)
Sodium: 134 mmol/L — ABNORMAL LOW (ref 135–145)
Total Bilirubin: 0.9 mg/dL (ref 0.3–1.2)
Total Protein: 5.3 g/dL — ABNORMAL LOW (ref 6.5–8.1)

## 2022-02-20 LAB — RESP PANEL BY RT-PCR (FLU A&B, COVID) ARPGX2
Influenza A by PCR: NEGATIVE
Influenza B by PCR: NEGATIVE
SARS Coronavirus 2 by RT PCR: NEGATIVE

## 2022-02-20 LAB — CBG MONITORING, ED: Glucose-Capillary: 71 mg/dL (ref 70–99)

## 2022-02-20 LAB — LACTIC ACID, PLASMA: Lactic Acid, Venous: 1.7 mmol/L (ref 0.5–1.9)

## 2022-02-20 LAB — GLUCOSE, CAPILLARY
Glucose-Capillary: 71 mg/dL (ref 70–99)
Glucose-Capillary: 73 mg/dL (ref 70–99)
Glucose-Capillary: 78 mg/dL (ref 70–99)
Glucose-Capillary: 83 mg/dL (ref 70–99)

## 2022-02-20 LAB — MRSA NEXT GEN BY PCR, NASAL: MRSA by PCR Next Gen: NOT DETECTED

## 2022-02-20 LAB — TROPONIN I (HIGH SENSITIVITY)
Troponin I (High Sensitivity): 49 ng/L — ABNORMAL HIGH (ref <18)
Troponin I (High Sensitivity): 57 ng/L — ABNORMAL HIGH (ref <18)

## 2022-02-20 LAB — PROCALCITONIN: Procalcitonin: 18.94 ng/mL

## 2022-02-20 MED ORDER — ASPIRIN EC 81 MG PO TBEC: 81.0000 mg | DELAYED_RELEASE_TABLET | Freq: Every day | ORAL | Status: DC

## 2022-02-20 MED ORDER — DOCUSATE SODIUM 100 MG PO CAPS: 100.0000 mg | ORAL_CAPSULE | Freq: Two times a day (BID) | ORAL | Status: DC | PRN

## 2022-02-20 MED ORDER — LACTATED RINGERS IV BOLUS (SEPSIS)
1000.0000 mL | Freq: Once | INTRAVENOUS | Status: AC
  Administered 2022-02-20: 1000 mL via INTRAVENOUS

## 2022-02-20 MED ORDER — DEXTROSE 50 % IV SOLN
INTRAVENOUS | Status: AC
  Filled 2022-02-20: qty 50

## 2022-02-20 MED ORDER — MIDODRINE HCL 5 MG PO TABS
10.0000 mg | ORAL_TABLET | Freq: Three times a day (TID) | ORAL | Status: DC
  Administered 2022-02-20: 10 mg via ORAL
  Filled 2022-02-20: qty 2

## 2022-02-20 MED ORDER — CHLORHEXIDINE GLUCONATE CLOTH 2 % EX PADS
6.0000 | MEDICATED_PAD | Freq: Every day | CUTANEOUS | Status: DC
  Administered 2022-02-20 – 2022-02-21 (×2): 6 via TOPICAL

## 2022-02-20 MED ORDER — SODIUM CHLORIDE 0.9 % IV SOLN: 1.0000 g | INTRAVENOUS | Status: DC

## 2022-02-20 MED ORDER — LACTATED RINGERS IV BOLUS (SEPSIS)
250.0000 mL | Freq: Once | INTRAVENOUS | Status: AC
  Administered 2022-02-20: 250 mL via INTRAVENOUS

## 2022-02-20 MED ORDER — CLOPIDOGREL BISULFATE 75 MG PO TABS: 75.0000 mg | ORAL_TABLET | Freq: Every day | ORAL | Status: DC

## 2022-02-20 MED ORDER — METRONIDAZOLE 500 MG/100ML IV SOLN
500.0000 mg | Freq: Once | INTRAVENOUS | Status: AC
  Administered 2022-02-20: 500 mg via INTRAVENOUS
  Filled 2022-02-20: qty 100

## 2022-02-20 MED ORDER — NALOXONE HCL 0.4 MG/ML IJ SOLN
0.4000 mg | Freq: Once | INTRAMUSCULAR | Status: AC
  Administered 2022-02-20: 0.4 mg via INTRAVENOUS
  Filled 2022-02-20: qty 1

## 2022-02-20 MED ORDER — ENOXAPARIN SODIUM 30 MG/0.3ML IJ SOSY
30.0000 mg | PREFILLED_SYRINGE | INTRAMUSCULAR | Status: DC
  Administered 2022-02-20: 30 mg via SUBCUTANEOUS
  Filled 2022-02-20: qty 0.3

## 2022-02-20 MED ORDER — INSULIN ASPART 100 UNIT/ML IJ SOLN: 0.0000 [IU] | INTRAMUSCULAR | Status: DC

## 2022-02-20 MED ORDER — VANCOMYCIN HCL 1500 MG/300ML IV SOLN
1500.0000 mg | Freq: Once | INTRAVENOUS | Status: AC
  Administered 2022-02-20: 1500 mg via INTRAVENOUS
  Filled 2022-02-20: qty 300

## 2022-02-20 MED ORDER — POLYETHYLENE GLYCOL 3350 17 G PO PACK: 17.0000 g | PACK | Freq: Every day | ORAL | Status: DC | PRN

## 2022-02-20 MED ORDER — HEPARIN SODIUM (PORCINE) 1000 UNIT/ML IJ SOLN
1000.0000 [IU] | Freq: Once | INTRAMUSCULAR | Status: AC
  Administered 2022-02-20: 1000 [IU]

## 2022-02-20 MED ORDER — VANCOMYCIN HCL IN DEXTROSE 1-5 GM/200ML-% IV SOLN: 1000.0000 mg | Freq: Once | INTRAVENOUS | Status: DC

## 2022-02-20 MED ORDER — SODIUM CHLORIDE 0.9 % IV SOLN
2.0000 g | INTRAVENOUS | Status: DC
  Administered 2022-02-21: 2 g via INTRAVENOUS
  Filled 2022-02-20: qty 20

## 2022-02-20 MED ORDER — DEXTROSE 50 % IV SOLN
12.5000 g | INTRAVENOUS | Status: AC
  Administered 2022-02-20: 12.5 g via INTRAVENOUS

## 2022-02-20 MED ORDER — LACTATED RINGERS IV SOLN: INTRAVENOUS | Status: AC

## 2022-02-20 MED ORDER — SODIUM CHLORIDE 0.9 % IV BOLUS
500.0000 mL | Freq: Once | INTRAVENOUS | Status: AC
  Administered 2022-02-20: 500 mL via INTRAVENOUS

## 2022-02-20 MED ORDER — NOREPINEPHRINE 4 MG/250ML-% IV SOLN
0.0000 ug/min | INTRAVENOUS | Status: DC
  Administered 2022-02-20: 2 ug/min via INTRAVENOUS
  Administered 2022-02-21: 9 ug/min via INTRAVENOUS
  Filled 2022-02-20 (×2): qty 250

## 2022-02-20 MED ORDER — VANCOMYCIN VARIABLE DOSE PER UNSTABLE RENAL FUNCTION (PHARMACIST DOSING): Status: DC

## 2022-02-20 MED ORDER — SODIUM CHLORIDE 0.9 % IV SOLN
2.0000 g | Freq: Once | INTRAVENOUS | Status: AC
  Administered 2022-02-20: 2 g via INTRAVENOUS
  Filled 2022-02-20: qty 2

## 2022-02-20 NOTE — Progress Notes (Signed)
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 This RN assessed left arm with ultrasound for IV placement/lab draw. While assessing left arm patient became agitated and stated that we can't use left arm. This RN moved to assess right arm with ultrasound. While assessing right arm, patient became belligerent and non-cooperative, jerking arm away and refusing any stick for labs/IV now.   Spoke with Lavone Orn., MD about encounter and recommended nephrology approval if they plan to request labs through HD line.

## 2022-02-20 NOTE — Progress Notes (Signed)
[facility] Progress Note    Interim Summary: pt was just dc'd on 2/20 after admission for fall, R globe/ eye trauma. Was dc'd back to SNF.  Today returns to ED for AMS and lethargy. He did have his OP dialysis yesterday (TTS HD). Asked to see for ESRD.      Subjective:  pt seen in ED, he is lethargic, does not appear to be in distress, he is not answering questions.    Objective Vitals:   02/09/2022 1110 02/16/2022 1120 01/30/2022 1300 02/11/2022 1350  BP: 114/74 105/79 (!) 96/51 (!) 84/71  Pulse:   88 88  Resp: 18 19 13 20   Temp:      TempSrc:      SpO2:   100% 100%   Physical Exam General: Ill appearing man, no distress, not responding verbally in ED.  R eye bandaged Heart: RRR; no murmur Lungs: CTAB Abdomen: soft ntnd +bs Extremities: R BKA, no LE or UE edema Dialysis Access: [facility]  Additional Objective Labs: Basic Metabolic Panel: Recent Labs  Lab 02/15/22 1225 02/16/22 0639 02/18/22 0237 02/24/2022 1141  NA 131* 132* 129* 131*  K 5.8* 4.7 5.8* 3.7  CL 94* 96* 93*  --   CO2 22 22 20*  --   GLUCOSE 60* 113* 100*  --   BUN 63* 30* 34*  --   CREATININE 9.49* 6.15* 7.10*  --   CALCIUM 8.0* 7.4* 8.9  --   PHOS 5.0*  --  4.2  --     Liver Function Tests: Recent Labs  Lab 02/14/22 0531 02/15/22 1225 02/18/22 0237  AST 53*  --   --   ALT 61*  --   --   ALKPHOS 97  --   --   BILITOT 0.6  --   --   PROT 7.6  --   --   ALBUMIN 3.5 3.0* 3.1*    Recent Labs  Lab 02/14/22 0531  LIPASE 36    CBC: Recent Labs  Lab 02/14/22 0531 02/14/22 0547 02/16/22 0639 02/17/22 0909 02/05/2022 1141 02/13/2022 1144 02/06/2022 1335  WBC 3.7*  --  4.2 4.2  --  4.1 4.1  NEUTROABS 2.2  --   --  2.7  --  PENDING PENDING  HGB 15.1   < > 11.5* 12.3* 13.9 13.2 11.9*  HCT 47.4   < > 36.7* 40.1 41.0 40.0 38.4*  MCV 92.6  --  92.9 94.8  --  91.3 93.2  PLT 142*  --  174 166  --  163 154   < > = values in this interval not displayed.    Medications:  [START ON  02/21/2022] ceFEPime (MAXIPIME) IV     lactated ringers 150 mL/hr at 02/17/2022 1307   norepinephrine (LEVOPHED) Adult infusion 2 mcg/min (02/18/2022 1407)    vancomycin variable dose per unstable renal function (pharmacist dosing)   Does not apply See admin instructions    OP HD: TTS NW  4h  400/500  64.7kg  2/2.5 bath  P4  RIJ TDC   Hep 4000  - no esa  - no vdra   CXR 2/22 - no edema, no acute   Assessment/Plan: AMS- just dc'd from hospital 2 days ago and returning now w/ AMS. W/u in progress including labs, blood cultures, CT head.  Traumatic ruptured globe/scleral laceration - d/t fall, s/p emergency surgery.  Per PMD/opthalmology   ESRD - gets HD TTS. Had OP HD yesterday. Next HD tomorrow.  Hypertension/volume  - BP's soft, possibly dehydrated or septic. No fluid off w/ HD tomorrow.   Anemia of CKD - Hgb 11, no ESA indicated at this time.   Secondary Hyperparathyroidism -  Ca pending. No vdra. Cont phoslo 2 ac tid. Recent phos at goal.   Nutrition - Renal diet w/fluid restrictions. Hx C1 fracture - per PMD DMT2 - per PMD

## 2022-02-20 NOTE — ED Notes (Signed)
RN got pulse ox to work after 6th attempt.

## 2022-02-20 NOTE — ED Triage Notes (Addendum)
Pt BIB EMS due to AMS. Pt is from [employer] and facility states pt was having intermittent confusion and lethargy. Pt is usually axox4 at baseline. Pt is able to follow commands on arrival. Pt is axox3. Pt was dialyzed yesterday.

## 2022-02-20 NOTE — ED Notes (Signed)
Caydence Enck daughter [phone] requesting an update

## 2022-02-20 NOTE — ED Notes (Signed)
RN attempted labs for 3rd time. MD notified. MD suggested RN call dialysis nurse to see if she can get blood from fistula. MD notified of pts BP. RN unable get a pulse ox reading. Fingernails are very thick and yellow--poor perfusion. Forehead and ear will not work either for pulse ox.

## 2022-02-20 NOTE — ED Notes (Signed)
Pt is not in room at this time. Pt is still in CT

## 2022-02-20 NOTE — ED Notes (Signed)
2 different phlebotomist attempted to get pts blood--unsuccessful. MD notified.

## 2022-02-20 NOTE — ED Provider Notes (Addendum)
[facility] EMERGENCY DEPARTMENT Provider Note   CSN: [id number] Arrival date & time: 02/03/2022  0850     History  Chief Complaint  Patient presents with   Altered Mental Status    Jose Robinson is a 66 y.o. male.  Patient brought in by EMS from [facility] for having intermittent confusion and lethargy.  Apparently normally alert and oriented baseline.  Patient is able to follow some commands upon arrival.  Patient is a dialysis patient appears he normally gets dialyzed Tuesday Thursday Saturdays.  Was dialyzed yesterday.  Patient states that use the left anterior chest catheter for dialysis.  She will blood pressures here were hypotensive.  Patient's alertness though was in and out.  Sometimes he was difficult to arouse other times he was actually a little bit on the combative side.  Patient was just discharged from the hospital after a prolonged hospitalization from February 16 of February 20.  Patient had a traumatic fall at other nursing facility.  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  Very extensive work-up.  An old C1 fracture that was determined by neurosurgery to be stable.  Summary of his last admission was principal problem was fall at nursing home active problems with end-stage renal disease diabetes type 2 chronic systolic congestive heart failure rupture of globe of eye following blunt trauma C1 cervical fracture and peripheral vascular disease.  And patient has a below the knee right leg amputation.  And appears to have some amputations of some fingers on the left hand.  Patient not able to go into details for [place].        Home Medications Prior to Admission medications   Medication Sig Start Date End Date Taking? Authorizing Provider  acetaminophen (TYLENOL) 500 MG tablet Take 1,000 mg by mouth in the morning, at noon, and at bedtime. (0800, 1200 & 1700)   Yes [provider]  aspirin 81 MG chewable tablet Chew 1  tablet (81 mg total) by mouth daily. 02/10/21  Yes Charlynne Cousins, MD  atorvastatin (LIPITOR) 20 MG tablet Take 1 tablet (20 mg total) by mouth daily. 02/10/21  Yes Charlynne Cousins, MD  bacitracin 500 UNIT/GM ointment Apply 1 application topically 2 (two) times daily. (0800 & 2000)   Yes [provider]  brimonidine (ALPHAGAN) 0.2 % ophthalmic solution Place 1 drop into both eyes in the morning and at bedtime. (0600 & 2000)   Yes [provider]  calcitRIOL (ROCALTROL) 0.5 MCG capsule Take 2 capsules (1 mcg total) by mouth Every Tuesday,Thursday,and Saturday with dialysis. 04/17/21  Yes [name], MD  calcium acetate (PHOSLO) 667 MG capsule Take 2 capsules (1,334 mg total) by mouth 3 (three) times daily with meals. 04/17/21  Yes [name], MD  calcium elemental as carbonate (TUMS ULTRA 1000) 400 MG chewable tablet Chew 2,000 mg by mouth daily. (0800)   Yes [provider]  cetirizine (ZYRTEC) 10 MG tablet Take 10 mg by mouth daily.   Yes [provider]  clopidogrel (PLAVIX) 75 MG tablet Take 1 tablet (75 mg total) by mouth daily. 02/06/22 02/06/23 Yes Broadus John, MD  collagenase (SANTYL) ointment Apply 1 application topically in the morning. (0800)   Yes [provider]  donepezil (ARICEPT) 10 MG tablet Take 10 mg by mouth in the morning. (0900) 12/30/20  Yes [provider]  dorzolamide-timolol (COSOPT) 22.3-6.8 MG/ML ophthalmic solution Place 1 drop into the right eye 2 (two)  times daily. 02/18/22  Yes Darliss Cheney, MD  Dulaglutide (TRULICITY) 1.5 HA/1.9FX SOPN Inject 1.5 mg into the skin every Thursday.   Yes [provider]  famotidine (PEPCID) 20 MG tablet Take 0.5 tablets (10 mg total) by mouth daily. Patient taking differently: Take 10 mg by mouth daily at 4 PM. (1630)give before evening meal; take with Pantoprazole. 08/01/21  Yes [place]aw, Ova [place] S, PA  gabapentin (NEURONTIN) 100 MG capsule Take 200 mg by mouth 3  (three) times daily. (0900, 1500 & 2100) 12/30/20  Yes [provider]  insulin aspart (NOVOLOG FLEXPEN) 100 UNIT/ML FlexPen Inject 3 Units into the skin 3 (three) times daily with meals as needed for high blood sugar (CBG>180). Patient taking differently: Inject 0-12 Units into the skin See admin instructions. Check FSBS before each meal at bedtime and inject per SSI: 0-150 = 0 units   150-200 = 4 unit   201 - 250 = 8 units   251 - 300 = 8 units   301-350=10 units . 351- 400 = 12 units   Greater than 400 = 12 units (Prime pen with 2 units prior to each use). 04/17/21  Yes [name], MD  Insulin Glargine [facility]) 100 UNIT/ML Inject 10 Units into the skin daily at 8 pm. (2000)   Yes [provider]  latanoprost (XALATAN) 0.005 % ophthalmic solution Place 1 drop into both eyes at bedtime. (2000)   Yes [provider]  loratadine (CLARITIN) 10 MG tablet Take 10 mg by mouth in the morning. (0800)   Yes [provider]  midodrine (PROAMATINE) 10 MG tablet Take 10 mg by mouth See admin instructions. Takes 60m daily on Tuesdays, Thursdays and Saturdays.   Yes [provider]  mupirocin cream (BACTROBAN) 2 % Apply 1 application topically 2 (two) times daily.   Yes [provider]  neomycin-polymyxin b-dexamethasone (MAXITROL) 3.5-10000-0.1 SUSP Place 1 drop into the right eye 4 (four) times daily. 02/18/22 03/20/22 Yes Pahwani, REinar Grad MD  nystatin powder Apply 1 application topically daily.   Yes [provider]  Pancrelipase, Lip-Prot-Amyl, (ZENPEP) 5000-24000 units CPEP Take 1 capsule by mouth with breakfast, with lunch, and with evening meal.   Yes [provider]  white petrolatum (VASELINE) GEL Apply 1 application topically in the morning and at bedtime. (0800 & 2000)   Yes [provider]  alum & mag hydroxide-simeth (MAALOX/MYLANTA) 200-200-20 MG/5ML suspension Take 10 mLs by mouth 4 (four) times daily -  with  meals and at bedtime. As needed for indigestion Patient not taking: Reported on 02/15/2022 08/06/21   [provider]  atropine 1 % ophthalmic solution Place 1 drop into the right eye daily. Patient not taking: Reported on 02/10/2022 02/19/22   PDarliss Cheney MD  blood glucose meter kit and supplies KIT Dispense based on patient and insurance preference. Use up to four times daily as directed. (FOR ICD-9 250.00, 250.01). Patient taking differently: Inject 1 each into the skin See admin instructions. Dispense based on patient and insurance preference. Use up to four times daily as directed. (FOR ICD-9 250.00, 250.01). 01/30/21   CDonne Hazel MD  calcium carbonate (TUMS - DOSED IN MG ELEMENTAL CALCIUM) 500 MG chewable tablet Chew 2 tablets by mouth daily as needed (gas). Patient not taking: Reported on 02/19/2022    [provider]  carbamide peroxide (DEBROX) 6.5 % OTIC solution Place 5 drops into both ears 2 (two) times daily as needed (irritation). Patient not taking: Reported  on 02/23/2022    [provider]  glucose 4 GM chewable tablet Chew 2 tablets (8 g total) by mouth 4 (four) times daily as needed for low blood sugar (for CBG<80). Patient not taking: Reported on 02/06/2022 04/17/21   [name], MD  guaiFENesin-dextromethorphan [facility] DM) 100-10 MG/5ML syrup Take 10 mLs by mouth every 4 (four) hours as needed for cough (chest congestion). Patient not taking: Reported on 01/30/2022 04/17/21   [name], MD  Hypertonic Nasal Wash (SINUS RINSE KIT NA) Place 1 packet into the nose as directed. Use in each nostril as needed for nasal congestion Patient not taking: Reported on 02/01/2022    [provider]  loperamide (IMODIUM A-D) 2 MG tablet Take 2-4 mg by mouth See admin instructions. 4 mg after 1st loose stool, then 2 mg after each loose stool. Max 4 tabs in 24 hours PRN Patient not taking: Reported on 02/25/2022    [provider]  Menthol  (HALLS COUGH DROPS) 7.6 MG LOZG Use as directed 1 lozenge in the mouth or throat daily as needed (cough). Patient not taking: Reported on 02/18/2022    [provider]  midodrine (PROAMATINE) 10 MG tablet Take 10 mg by mouth at bedtime as needed (drops in blood pressure during treatment). Patient not taking: Reported on 02/08/2022 11/08/21   [provider]  naproxen sodium (ALEVE) 220 MG tablet Take 440 mg by mouth 2 (two) times daily as needed (pain/headache). Patient not taking: Reported on 02/11/2022    [provider]  ondansetron (ZOFRAN ODT) 4 MG disintegrating tablet Take 1 tablet (4 mg total) by mouth every 8 (eight) hours as needed for nausea or vomiting. Patient not taking: Reported on 02/24/2022 08/01/21   Tedd Sias, PA  Oxycodone HCl 10 MG TABS Take 1 tablet (10 mg total) by mouth every 6 (six) hours. (0000, 0600, 1200 & 1800) 02/18/22   Darliss Cheney, MD  pantoprazole (PROTONIX) 40 MG tablet Take 1 tablet (40 mg total) by mouth daily. Patient taking differently: Take 40 mg by mouth daily before breakfast. 04/17/21   [name], MD  REFRESH TEARS 0.5 % SOLN Place 1 drop into both eyes every hour as needed for dry eyes. Patient not taking: Reported on 02/12/2022 10/12/21   [provider]      Allergies    Acetaminophen, Prednisone, and Ivp dye [iodinated contrast media]    Review of Systems   Review of Systems  Unable to perform ROS: Mental status change   Physical Exam Updated Vital Signs BP (!) 84/71 (BP Location: Right Arm)    Pulse 88    Temp (!) 97.5 F (36.4 C) (Oral)    Resp 20    SpO2 100%  Physical Exam Vitals and nursing note reviewed.  Constitutional:      General: He is in acute distress.     Appearance: He is well-developed. He is ill-appearing.  HENT:     Head: Normocephalic.     Comments: Eye patch on the right eye.  With a little bit of bleeding and some oozing.  Swelling to the right side of the upper lip.  Tongue  is not swollen. Eyes:     Extraocular Movements: Extraocular movements intact.     Conjunctiva/sclera: Conjunctivae normal.     Pupils: Pupils are equal, round, and reactive to light.  Cardiovascular:     Rate and Rhythm: Normal rate and regular rhythm.     Heart sounds: No  murmur heard. Pulmonary:     Effort: Pulmonary effort is normal. No respiratory distress.     Breath sounds: Normal breath sounds.     Comments: Dialysis catheter left anterior chest Abdominal:     General: There is no distension.     Palpations: Abdomen is soft.     Tenderness: There is no abdominal tenderness.  Musculoskeletal:        General: No swelling.     Cervical back: Neck supple.     Comments: Right below the knee amputation left hand with some of the fingers amputated everything well-healed.  AV fistula right arm without good thrill.  Skin:    General: Skin is warm and dry.     Capillary Refill: Capillary refill takes less than 2 seconds.  Neurological:     Mental Status: He is alert.     Comments: Will rales to stimulus.  At times will be combative.  At times will be somewhat talkative.  But waxes and wanes on this mental status.  Is to move all 4 extremities.  Psychiatric:        Mood and Affect: Mood normal.    ED Results / Procedures / Treatments   Labs (all labs ordered are listed, but only abnormal results are displayed) Labs Reviewed  CBC WITH DIFFERENTIAL/PLATELET - Abnormal; Notable for the following components:      Result Value   RBC 4.12 (*)    Hemoglobin 11.9 (*)    HCT 38.4 (*)    All other components within normal limits  I-STAT ARTERIAL BLOOD GAS, ED - Abnormal; Notable for the following components:   pCO2 arterial 30.7 (*)    pO2, Arterial 71 (*)    Bicarbonate 19.9 (*)    TCO2 21 (*)    Acid-base deficit 4.0 (*)    Sodium 131 (*)    All other components within normal limits  RESP PANEL BY RT-PCR (FLU A&B, COVID) ARPGX2  CULTURE, BLOOD (ROUTINE X 2)  CULTURE, BLOOD  (ROUTINE X 2)  CBC WITH DIFFERENTIAL/PLATELET  COMPREHENSIVE METABOLIC PANEL  LACTIC ACID, PLASMA  CBG MONITORING, ED  TROPONIN I (HIGH SENSITIVITY)  TROPONIN I (HIGH SENSITIVITY)    EKG None  Radiology CT Head Wo Contrast  Result Date: 02/13/2022 CLINICAL DATA:  Provided history: Mental status change, unknown cause. Unresponsive. EXAM: CT HEAD WITHOUT CONTRAST TECHNIQUE: Contiguous axial images were obtained from the base of the skull through the vertex without intravenous contrast. RADIATION DOSE REDUCTION: This exam was performed according to the departmental dose-optimization program which includes automated exposure control, adjustment of the mA and/or kV according to patient size and/or use of iterative reconstruction technique. COMPARISON:  Prior head CT examinations 02/14/2022 and earlier. Brain MRI 11/20/2021. CT cervical spine 02/14/2022. CT of the orbits 02/14/2022. FINDINGS: Brain: Mild generalized cerebral and cerebellar atrophy. Mild patchy and ill-defined hypoattenuation within the cerebral white matter, nonspecific but compatible with chronic small vessel ischemic disease. There is no acute intracranial hemorrhage. No demarcated cortical infarct. No extra-axial fluid collection. No evidence of an intracranial mass. No midline shift. Vascular: No hyperdense vessel.  Atherosclerotic calcifications Skull: Normal. Negative for fracture or focal lesion. Sinuses/Orbits: Abnormal density of the right globe, which may reflect residual hemorrhage related to recent globe rupture and/or post procedural changes. Mild mucosal thickening, and small volume frothy secretions, within the left sphenoid sinus with associated chronic reactive osteitis. Mild-to-moderate mucosal thickening within the left maxillary sinus with associated chronic reactive osteitis. The left maxillary sinus is  asymmetrically small as compared to the right. Trace mucosal thickening within the right maxillary sinus. Other:  Redemonstrated chronic, displaced fracture deformities of the C1 anterior and posterior arches. Left mastoid effusion. Trace fluid also present within the right mastoid air cells. IMPRESSION: 1. No evidence of acute intracranial abnormality. 2. Mild chronic small vessel ischemic changes within the cerebral white matter. 3. Mild generalized parenchymal atrophy. 4. Abnormal hyperdensity of the right globe, which may reflect residual hemorrhage related to recent globe rupture and/or postprocedural changes. 5. Paranasal sinus disease at the imaged levels, as described. 6. Left mastoid effusion. Trace fluid also present within the right mastoid air cells. 7. Redemonstrated chronic, displaced fracture deformities of the C1 anterior and posterior arches. Electronically Signed   By: Kellie Simmering D.O.   On: 02/15/2022 12:54   DG Chest Port 1 View  Result Date: 02/03/2022 CLINICAL DATA:  Altered mental status. EXAM: PORTABLE CHEST 1 VIEW COMPARISON:  Chest radiograph dated February 14, 2022 FINDINGS: The heart size and mediastinal contours are within normal limits. Both lungs are clear. No acute osseous abnormality noted. Partially imaged anterior cervical discectomy and fusion hardware. Left IJ access dialysis catheter with distal tip at the cavoatrial junction, unchanged. Redemonstration of the bilateral axillary vascular stents with mild kinking of the right and moderate kinking of the left stent, unchanged. IMPRESSION: No active disease. Additional findings as above, unchanged. Electronically Signed   By: Keane Police D.O.   On: 02/21/2022 09:26    Procedures Procedures    Medications Ordered in ED Medications  lactated ringers infusion ( Intravenous New Bag/Given 02/17/2022 1307)  vancomycin variable dose per unstable renal function (pharmacist dosing) (has no administration in time range)  ceFEPIme (MAXIPIME) 1 g in sodium chloride 0.9 % 100 mL IVPB (has no administration in time range)  norepinephrine  (LEVOPHED) 18m in 2568m(0.016 mg/mL) premix infusion (2 mcg/min Intravenous New Bag/Given 02/23/2022 1407)  naloxone (NARCAN) injection 0.4 mg (0.4 mg Intravenous Given 02/16/2022 0925)  sodium chloride 0.9 % bolus 500 mL (0 mLs Intravenous Stopped 02/21/2022 1126)  naloxone (NARCAN) injection 0.4 mg (0.4 mg Intravenous Given 02/24/2022 1051)  lactated ringers bolus 1,000 mL (0 mLs Intravenous Stopped 01/31/2022 1350)    And  lactated ringers bolus 1,000 mL (0 mLs Intravenous Stopped 02/03/2022 1252)    And  lactated ringers bolus 250 mL (0 mLs Intravenous Stopped 02/17/2022 1306)  ceFEPIme (MAXIPIME) 2 g in sodium chloride 0.9 % 100 mL IVPB (0 g Intravenous Stopped 02/18/2022 1139)  metroNIDAZOLE (FLAGYL) IVPB 500 mg (0 mg Intravenous Stopped 02/19/2022 1252)  vancomycin (VANCOREADY) IVPB 1500 mg/300 mL (0 mg Intravenous Stopped 02/17/2022 1350)  heparin sodium (porcine) injection 1,000 Units (1,000 Units Intracatheter Given 02/24/2022 1341)   CRITICAL CARE Performed by: ScFredia Sorrowotal critical care time: 60 minutes Critical care time was exclusive of separately billable procedures and treating other patients. Critical care was necessary to treat or prevent imminent or life-threatening deterioration. Critical care was time spent personally by me on the following activities: development of treatment plan with patient and/or surrogate as well as nursing, discussions with consultants, evaluation of patient's response to treatment, examination of patient, obtaining history from patient or surrogate, ordering and performing treatments and interventions, ordering and review of laboratory studies, ordering and review of radiographic studies, pulse oximetry and re-evaluation of patient's condition.    Initially concerns were may be for overmedication with pain medication because he does have oxycodone listed as a medication.  We gave him  Narcan.  He did seem to wake up some.  But did not totally help his blood pressures.   Blood pressures did improve some they drifted down back to being hypotensive.  He did have some diarrhea here that had no gross blood but had a little bit of a pinkish tone to it.  And it was brown stool.  Based on this and the persistent hypotension did call sepsis protocol initiated that.  We struggled with some IV access and getting blood on him.  This called some delays in his lab results.  We did do an arterial blood gas which was very good.  Normal pH normal PCO2 so just have continued him on nasal cannula oxygen now getting better pulse ox readings and they are all very good.  Chest x-ray without acute findings CT head without anything acute but does raise some persisting concerns about that enucleated right eye.  They may need to be seen by ophthalmology again.  Patient got his full 30 cc/kg fluid challenge pressures did come up initially with that up to around 582 systolic.  But now they have dropped off again.  And have started norepinephrine and will discuss with critical care for admission.  Lactic acid still pending White blood cell count not significantly elevated COVID testing flu testing is negative blood sugars have been good at 71.  Probably needs a repeat.  1 point we had IV team down here and there and do ultrasound IV but patient refused to let him stick anything in his arms.  Discussed with nephrology they are aware of his admission.  And they also gave permission to draw some blood from his left anterior dialysis cath.  And we did draw initial blood from that but apparently blood cultures can be drawn from that so he never had blood cultures. ED Course/ Medical Decision Making/ A&P                           Medical Decision Making Amount and/or Complexity of Data Reviewed Labs: ordered. Radiology: ordered.  Risk Prescription drug management. Decision regarding hospitalization.    Final Clinical Impression(s) / ED Diagnoses Final diagnoses:  Altered mental status,  unspecified altered mental status type  ESRD (end stage renal disease) on dialysis ([place])  Sepsis, due to unspecified organism, unspecified whether acute organ dysfunction present ([place])  Hypotension, unspecified hypotension type    Rx / DC Orders ED Discharge Orders     None         Fredia Sorrow, MD 02/06/2022 1410    Fredia Sorrow, MD 02/23/2022 1432

## 2022-02-20 NOTE — Sepsis Progress Note (Addendum)
Code Sepsis protocol being monitored by e[facility]. This pt has been a very difficult stick for lab work. Fluids and antibiotics started. ESRD on HD TTS

## 2022-02-20 NOTE — ED Notes (Signed)
IV team at bedside to collect blood

## 2022-02-20 NOTE — ED Notes (Signed)
RN updated daughter, [place].

## 2022-02-20 NOTE — ED Notes (Signed)
IV team unable to get PIV

## 2022-02-20 NOTE — ED Notes (Signed)
RN turned O2 to 2L

## 2022-02-20 NOTE — ED Notes (Signed)
RN aware of BP 

## 2022-02-20 NOTE — ED Notes (Signed)
Unable to get labs ?

## 2022-02-20 NOTE — ED Provider Notes (Signed)
Spoke with nephrologist Dr. Marval Regal.  He is given permission for a one-time blood draw from the patient's nephrology catheter dialysis catheter in his left anterior chest.  Will let the IV team know.   Fredia Sorrow, MD 02/23/2022 [phone]

## 2022-02-20 NOTE — ED Notes (Signed)
IV team at bedside 

## 2022-02-20 NOTE — Progress Notes (Signed)
Belongings on admission: Shoes Jacket Shirt Pants  [employer] ear piece   Pt wearing cross necklace, two bracelets on left arm

## 2022-02-20 NOTE — Progress Notes (Addendum)
Pharmacy Antibiotic Note  Jose Robinson is a 65 y.o. male admitted on 02/03/2022 with sepsis.  Pharmacy has been consulted for vancomycin and cefepime dosing. Patient is ESRD on iHD TTS - last iHD on 2/21. Tamx 97.5.   Plan: Vancomycin 1500mg  x1 loading dose  Follow up iHD schedule for maintenance dosing of vancomycin  Cefepime 2g x1 followed by cefepime 1g q24h Metronidazole per MD Monitor cultures, iHD schedule and clinical progression      Temp (24hrs), Avg:97.5 F (36.4 C), Min:97.5 F (36.4 C), Max:97.5 F (36.4 C)  Recent Labs  Lab 02/14/22 0531 02/14/22 0547 02/15/22 1225 02/16/22 0639 02/17/22 0909 02/18/22 0237  WBC 3.7*  --   --  4.2 4.2  --   CREATININE 7.77* 8.50* 9.49* 6.15*  --  7.10*    Estimated Creatinine Clearance: 10.3 mL/min (A) (by C-G formula based on SCr of 7.1 mg/dL (H)).    Allergies  Allergen Reactions   Acetaminophen Palpitations   Prednisone Other (See Comments)    Severe hallucinations/paranoid delusions after steroid premedications for contrast allergy, required IV haldol and restraints.    Ivp Dye [Iodinated Contrast Media] Hives    Antimicrobials this admission: Vancomycin 2/2 >>  Cefepime 2/22 >>   Dose adjustments this admission:   Microbiology results: 2/22 bcx:   Thank you for allowing pharmacy to be a part of this patients care.  Cristela Felt, PharmD, BCPS Clinical Pharmacist 02/19/2022 10:57 AM

## 2022-02-20 NOTE — Progress Notes (Signed)
°  Echocardiogram 2D Echocardiogram has been performed.  Jose Robinson 01/30/2022, 4:49 PM
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
## 2022-02-21 ENCOUNTER — Inpatient Hospital Stay (HOSPITAL_COMMUNITY): Payer: Medicare (Managed Care)

## 2022-02-21 DIAGNOSIS — N186 End stage renal disease: Secondary | ICD-10-CM | POA: Diagnosis not present

## 2022-02-21 DIAGNOSIS — Z95828 Presence of other vascular implants and grafts: Secondary | ICD-10-CM

## 2022-02-21 DIAGNOSIS — A419 Sepsis, unspecified organism: Secondary | ICD-10-CM | POA: Diagnosis not present

## 2022-02-21 DIAGNOSIS — Z992 Dependence on renal dialysis: Secondary | ICD-10-CM | POA: Diagnosis not present

## 2022-02-21 DIAGNOSIS — G9340 Encephalopathy, unspecified: Secondary | ICD-10-CM | POA: Diagnosis not present

## 2022-02-21 DIAGNOSIS — I959 Hypotension, unspecified: Secondary | ICD-10-CM | POA: Diagnosis not present

## 2022-02-21 HISTORY — PX: IR REMOVAL TUN CV CATH W/O FL: IMG2289

## 2022-02-21 LAB — POCT I-STAT 7, (LYTES, BLD GAS, ICA,H+H)
Acid-base deficit: 19 mmol/L — ABNORMAL HIGH (ref 0.0–2.0)
Acid-base deficit: 23 mmol/L — ABNORMAL HIGH (ref 0.0–2.0)
Acid-base deficit: 26 mmol/L — ABNORMAL HIGH (ref 0.0–2.0)
Bicarbonate: 9 mmol/L — ABNORMAL LOW (ref 20.0–28.0)
Bicarbonate: 5.8 mmol/L — ABNORMAL LOW (ref 20.0–28.0)
Bicarbonate: 3.8 mmol/L — ABNORMAL LOW (ref 20.0–28.0)
Calcium, Ion: 1.05 mmol/L — ABNORMAL LOW (ref 1.15–1.40)
Calcium, Ion: 0.95 mmol/L — ABNORMAL LOW (ref 1.15–1.40)
Calcium, Ion: 0.89 mmol/L — CL (ref 1.15–1.40)
HCT: 47 % (ref 39.0–52.0)
HCT: 43 % (ref 39.0–52.0)
HCT: 39 % (ref 39.0–52.0)
Hemoglobin: 16 g/dL (ref 13.0–17.0)
Hemoglobin: 14.6 g/dL (ref 13.0–17.0)
Hemoglobin: 13.3 g/dL (ref 13.0–17.0)
O2 Saturation: 100 %
O2 Saturation: 98 %
O2 Saturation: 99 %
Patient temperature: 97.7
Patient temperature: 98
Potassium: 6.1 mmol/L — ABNORMAL HIGH (ref 3.5–5.1)
Potassium: 5.5 mmol/L — ABNORMAL HIGH (ref 3.5–5.1)
Potassium: 4.2 mmol/L (ref 3.5–5.1)
Sodium: 130 mmol/L — ABNORMAL LOW (ref 135–145)
Sodium: 137 mmol/L (ref 135–145)
Sodium: 138 mmol/L (ref 135–145)
TCO2: 10 mmol/L — ABNORMAL LOW (ref 22–32)
TCO2: 6 mmol/L — ABNORMAL LOW (ref 22–32)
TCO2: <5 mmol/L — ABNORMAL LOW (ref 22–32)
pCO2 arterial: 26.2 mmHg — ABNORMAL LOW (ref 32–48)
pCO2 arterial: 21.5 mmHg — ABNORMAL LOW (ref 32–48)
pCO2 arterial: 16.2 mmHg — CL (ref 32–48)
pH, Arterial: 7.139 — CL (ref 7.35–7.45)
pH, Arterial: 7.037 — CL (ref 7.35–7.45)
pH, Arterial: 6.978 — CL (ref 7.35–7.45)
pO2, Arterial: 380 mmHg — ABNORMAL HIGH (ref 83–108)
pO2, Arterial: 150 mmHg — ABNORMAL HIGH (ref 83–108)
pO2, Arterial: 184 mmHg — ABNORMAL HIGH (ref 83–108)

## 2022-02-21 LAB — COMPREHENSIVE METABOLIC PANEL
ALT: 70 U/L — ABNORMAL HIGH (ref 0–44)
AST: 104 U/L — ABNORMAL HIGH (ref 15–41)
Albumin: 2.1 g/dL — ABNORMAL LOW (ref 3.5–5.0)
Alkaline Phosphatase: 116 U/L (ref 38–126)
Anion gap: 15 (ref 5–15)
BUN: 42 mg/dL — ABNORMAL HIGH (ref 8–23)
CO2: 18 mmol/L — ABNORMAL LOW (ref 22–32)
Calcium: 8.3 mg/dL — ABNORMAL LOW (ref 8.9–10.3)
Chloride: 97 mmol/L — ABNORMAL LOW (ref 98–111)
Creatinine, Ser: 6.8 mg/dL — ABNORMAL HIGH (ref 0.61–1.24)
GFR, Estimated: 8 mL/min — ABNORMAL LOW (ref >60)
Glucose, Bld: 221 mg/dL — ABNORMAL HIGH (ref 70–99)
Potassium: 5 mmol/L (ref 3.5–5.1)
Sodium: 130 mmol/L — ABNORMAL LOW (ref 135–145)
Total Bilirubin: 1.1 mg/dL (ref 0.3–1.2)
Total Protein: 5.8 g/dL — ABNORMAL LOW (ref 6.5–8.1)

## 2022-02-21 LAB — CBC WITH DIFFERENTIAL/PLATELET
HCT: 40 % (ref 39.0–52.0)
Hemoglobin: 13.2 g/dL (ref 13.0–17.0)
MCH: 30.1 pg (ref 26.0–34.0)
MCHC: 33 g/dL (ref 30.0–36.0)
MCV: 91.3 fL (ref 80.0–100.0)
Platelets: 163 10*3/uL (ref 150–400)
RBC: 4.38 MIL/uL (ref 4.22–5.81)
RDW: 15.1 % (ref 11.5–15.5)
WBC: 4.1 10*3/uL (ref 4.0–10.5)
nRBC: 0 % (ref 0.0–0.2)

## 2022-02-21 LAB — CBC
HCT: 48 % (ref 39.0–52.0)
Hemoglobin: 15.2 g/dL (ref 13.0–17.0)
MCH: 29.2 pg (ref 26.0–34.0)
MCHC: 31.7 g/dL (ref 30.0–36.0)
MCV: 92.1 fL (ref 80.0–100.0)
Platelets: 272 10*3/uL (ref 150–400)
RBC: 5.21 MIL/uL (ref 4.22–5.81)
RDW: 15.8 % — ABNORMAL HIGH (ref 11.5–15.5)
WBC: 13.4 10*3/uL — ABNORMAL HIGH (ref 4.0–10.5)
nRBC: 1 % — ABNORMAL HIGH (ref 0.0–0.2)

## 2022-02-21 LAB — GLUCOSE, CAPILLARY
Glucose-Capillary: 114 mg/dL — ABNORMAL HIGH (ref 70–99)
Glucose-Capillary: 125 mg/dL — ABNORMAL HIGH (ref 70–99)
Glucose-Capillary: 127 mg/dL — ABNORMAL HIGH (ref 70–99)
Glucose-Capillary: 14 mg/dL — CL (ref 70–99)
Glucose-Capillary: 14 mg/dL — CL (ref 70–99)
Glucose-Capillary: 145 mg/dL — ABNORMAL HIGH (ref 70–99)
Glucose-Capillary: 155 mg/dL — ABNORMAL HIGH (ref 70–99)
Glucose-Capillary: 25 mg/dL — CL (ref 70–99)
Glucose-Capillary: 57 mg/dL — ABNORMAL LOW (ref 70–99)
Glucose-Capillary: 66 mg/dL — ABNORMAL LOW (ref 70–99)
Glucose-Capillary: 70 mg/dL (ref 70–99)
Glucose-Capillary: 90 mg/dL (ref 70–99)
Glucose-Capillary: 95 mg/dL (ref 70–99)
Glucose-Capillary: 95 mg/dL (ref 70–99)

## 2022-02-21 LAB — RENAL FUNCTION PANEL
Albumin: 1.6 g/dL — ABNORMAL LOW (ref 3.5–5.0)
BUN: 39 mg/dL — ABNORMAL HIGH (ref 8–23)
CO2: <7 mmol/L — ABNORMAL LOW (ref 22–32)
Calcium: 7.5 mg/dL — ABNORMAL LOW (ref 8.9–10.3)
Chloride: 103 mmol/L (ref 98–111)
Creatinine, Ser: 6.14 mg/dL — ABNORMAL HIGH (ref 0.61–1.24)
GFR, Estimated: 10 mL/min — ABNORMAL LOW (ref >60)
Glucose, Bld: 165 mg/dL — ABNORMAL HIGH (ref 70–99)
Phosphorus: 9.5 mg/dL — ABNORMAL HIGH (ref 2.5–4.6)
Potassium: 4.5 mmol/L (ref 3.5–5.1)
Sodium: 137 mmol/L (ref 135–145)

## 2022-02-21 LAB — HEMOGLOBIN A1C
Hgb A1c MFr Bld: 6 % — ABNORMAL HIGH (ref 4.8–5.6)
Mean Plasma Glucose: 126 mg/dL

## 2022-02-21 LAB — MAGNESIUM: Magnesium: 2.2 mg/dL (ref 1.7–2.4)

## 2022-02-21 MED ORDER — SODIUM BICARBONATE 8.4 % IV SOLN: 100.0000 meq | Freq: Once | INTRAVENOUS | Status: DC

## 2022-02-21 MED ORDER — POLYETHYLENE GLYCOL 3350 17 G PO PACK: 17.0000 g | PACK | Freq: Every day | ORAL | Status: DC

## 2022-02-21 MED ORDER — FENTANYL CITRATE PF 50 MCG/ML IJ SOSY
PREFILLED_SYRINGE | INTRAMUSCULAR | Status: AC
Start: 2022-02-21 — End: 2022-02-21
  Filled 2022-02-21: qty 2

## 2022-02-21 MED ORDER — FENTANYL BOLUS VIA INFUSION
50.0000 ug | INTRAVENOUS | Status: DC | PRN
  Filled 2022-02-21: qty 100

## 2022-02-21 MED ORDER — PRISMASOL BGK 4/2.5 32-4-2.5 MEQ/L REPLACEMENT SOLN
Status: DC
  Filled 2022-02-21 (×3): qty 5000

## 2022-02-21 MED ORDER — VANCOMYCIN VARIABLE DOSE PER UNSTABLE RENAL FUNCTION (PHARMACIST DOSING): Status: DC

## 2022-02-21 MED ORDER — CHLORHEXIDINE GLUCONATE 0.12% ORAL RINSE (MEDLINE KIT)
15.0000 mL | Freq: Two times a day (BID) | OROMUCOSAL | Status: DC
  Administered 2022-02-21: 15 mL via OROMUCOSAL

## 2022-02-21 MED ORDER — LIDOCAINE HCL 1 % IJ SOLN
INTRAMUSCULAR | Status: AC
  Filled 2022-02-21: qty 20

## 2022-02-21 MED ORDER — SODIUM CHLORIDE 0.9 % FOR CRRT: INTRAVENOUS_CENTRAL | Status: DC | PRN

## 2022-02-21 MED ORDER — SODIUM BICARBONATE 8.4 % IV SOLN
100.0000 meq | Freq: Once | INTRAVENOUS | Status: AC
  Administered 2022-02-21: 100 meq via INTRAVENOUS
  Filled 2022-02-21: qty 50

## 2022-02-21 MED ORDER — HEPARIN SODIUM (PORCINE) 5000 UNIT/ML IJ SOLN
5000.0000 [IU] | Freq: Three times a day (TID) | INTRAMUSCULAR | Status: DC
  Administered 2022-02-21 (×2): 5000 [IU] via SUBCUTANEOUS
  Filled 2022-02-21 (×2): qty 1

## 2022-02-21 MED ORDER — ROCURONIUM BROMIDE 10 MG/ML (PF) SYRINGE
PREFILLED_SYRINGE | INTRAVENOUS | Status: AC
  Filled 2022-02-21: qty 10

## 2022-02-21 MED ORDER — SODIUM CHLORIDE 0.9 % IV BOLUS
500.0000 mL | Freq: Once | INTRAVENOUS | Status: AC
Start: 2022-02-21 — End: 2022-02-21
  Administered 2022-02-21: 500 mL via INTRAVENOUS

## 2022-02-21 MED ORDER — HEPARIN SODIUM (PORCINE) 1000 UNIT/ML DIALYSIS
1000.0000 [IU] | INTRAMUSCULAR | Status: DC | PRN
  Administered 2022-02-21 (×2): 3000 [IU] via INTRAVENOUS_CENTRAL
  Filled 2022-02-21 (×3): qty 6
  Filled 2022-02-21: qty 3

## 2022-02-21 MED ORDER — MIDAZOLAM HCL 2 MG/2ML IJ SOLN
2.0000 mg | Freq: Once | INTRAMUSCULAR | Status: AC
  Administered 2022-02-21: 2 mg via INTRAVENOUS

## 2022-02-21 MED ORDER — MIDAZOLAM HCL 2 MG/2ML IJ SOLN
INTRAMUSCULAR | Status: AC
  Filled 2022-02-21: qty 2

## 2022-02-21 MED ORDER — DEXTROSE 50 % IV SOLN
12.5000 g | INTRAVENOUS | Status: AC
  Administered 2022-02-21: 12.5 g via INTRAVENOUS

## 2022-02-21 MED ORDER — STERILE WATER FOR INJECTION IV SOLN
INTRAVENOUS | Status: DC
  Filled 2022-02-21 (×3): qty 1000

## 2022-02-21 MED ORDER — SODIUM BICARBONATE 8.4 % IV SOLN
INTRAVENOUS | Status: AC
  Filled 2022-02-21: qty 50

## 2022-02-21 MED ORDER — PRISMASOL BGK 4/2.5 32-4-2.5 MEQ/L EC SOLN
Status: DC
  Filled 2022-02-21 (×9): qty 5000

## 2022-02-21 MED ORDER — DEXTROSE 50 % IV SOLN
25.0000 g | Freq: Once | INTRAVENOUS | Status: AC
  Administered 2022-02-21: 25 g via INTRAVENOUS

## 2022-02-21 MED ORDER — PHENYLEPHRINE CONCENTRATED 100MG/250ML (0.4 MG/ML) INFUSION SIMPLE
0.0000 ug/min | INTRAVENOUS | Status: DC
  Administered 2022-02-21: 400 ug/min via INTRAVENOUS
  Filled 2022-02-21 (×2): qty 250

## 2022-02-21 MED ORDER — CHLORHEXIDINE GLUCONATE 4 % EX LIQD
CUTANEOUS | Status: AC
  Filled 2022-02-21: qty 15

## 2022-02-21 MED ORDER — SODIUM BICARBONATE 8.4 % IV SOLN
150.0000 meq | Freq: Once | INTRAVENOUS | Status: AC
  Administered 2022-02-21: 150 meq via INTRAVENOUS
  Filled 2022-02-21: qty 150

## 2022-02-21 MED ORDER — FENTANYL 2500MCG IN NS 250ML (10MCG/ML) PREMIX INFUSION
50.0000 ug/h | INTRAVENOUS | Status: DC
  Administered 2022-02-21: 50 ug/h via INTRAVENOUS
  Filled 2022-02-21: qty 250

## 2022-02-21 MED ORDER — DEXTROSE 50 % IV SOLN
INTRAVENOUS | Status: AC
Start: 2022-02-21 — End: 2022-02-21
  Filled 2022-02-21: qty 50

## 2022-02-21 MED ORDER — ALTEPLASE 2 MG IJ SOLR: 2.0000 mg | Freq: Once | INTRAMUSCULAR | Status: DC | PRN

## 2022-02-21 MED ORDER — FENTANYL CITRATE PF 50 MCG/ML IJ SOSY: 50.0000 ug | PREFILLED_SYRINGE | Freq: Once | INTRAMUSCULAR | Status: DC

## 2022-02-21 MED ORDER — FENTANYL CITRATE PF 50 MCG/ML IJ SOSY
100.0000 ug | PREFILLED_SYRINGE | Freq: Once | INTRAMUSCULAR | Status: AC
  Administered 2022-02-21: 100 ug via INTRAVENOUS

## 2022-02-21 MED ORDER — PANTOPRAZOLE SODIUM 40 MG IV SOLR
40.0000 mg | INTRAVENOUS | Status: DC
  Administered 2022-02-21: 40 mg via INTRAVENOUS
  Filled 2022-02-21: qty 10

## 2022-02-21 MED ORDER — ORAL CARE MOUTH RINSE
15.0000 mL | OROMUCOSAL | Status: DC
  Administered 2022-02-21 – 2022-02-22 (×3): 15 mL via OROMUCOSAL

## 2022-02-21 MED ORDER — EPINEPHRINE HCL 5 MG/250ML IV SOLN IN NS
0.5000 ug/min | INTRAVENOUS | Status: DC
  Administered 2022-02-21: 0.5 ug/min via INTRAVENOUS
  Administered 2022-02-21 – 2022-02-22 (×2): 20 ug/min via INTRAVENOUS
  Filled 2022-02-21 (×4): qty 250

## 2022-02-21 MED ORDER — DOCUSATE SODIUM 50 MG/5ML PO LIQD: 100.0000 mg | Freq: Two times a day (BID) | ORAL | Status: DC

## 2022-02-21 MED ORDER — NOREPINEPHRINE 16 MG/250ML-% IV SOLN
0.0000 ug/min | INTRAVENOUS | Status: DC
  Administered 2022-02-21: 60 ug/min via INTRAVENOUS
  Administered 2022-02-21: 40 ug/min via INTRAVENOUS
  Administered 2022-02-21: 20 ug/min via INTRAVENOUS
  Administered 2022-02-21: 60 ug/min via INTRAVENOUS
  Filled 2022-02-21 (×5): qty 250

## 2022-02-21 MED ORDER — SODIUM CHLORIDE 0.9 % IV SOLN
2.0000 g | Freq: Two times a day (BID) | INTRAVENOUS | Status: DC
  Administered 2022-02-21: 2 g via INTRAVENOUS
  Filled 2022-02-21: qty 2

## 2022-02-21 MED ORDER — VASOPRESSIN 20 UNITS/100 ML INFUSION FOR SHOCK
0.0000 [IU]/min | INTRAVENOUS | Status: DC
  Administered 2022-02-21 (×2): 0.05 [IU]/min via INTRAVENOUS
  Administered 2022-02-21: 0.03 [IU]/min via INTRAVENOUS
  Administered 2022-02-22: 0.05 [IU]/min via INTRAVENOUS
  Filled 2022-02-21 (×3): qty 100

## 2022-02-21 MED ORDER — SODIUM CHLORIDE 0.9 % IV SOLN: INTRAVENOUS | Status: DC | PRN

## 2022-02-21 MED ORDER — VANCOMYCIN HCL IN DEXTROSE 1-5 GM/200ML-% IV SOLN
1000.0000 mg | INTRAVENOUS | Status: DC
  Administered 2022-02-21: 1000 mg via INTRAVENOUS
  Filled 2022-02-21: qty 200

## 2022-02-21 MED ORDER — ROCURONIUM BROMIDE 50 MG/5ML IV SOLN
80.0000 mg | Freq: Once | INTRAVENOUS | Status: AC
  Administered 2022-02-21: 80 mg via INTRAVENOUS
  Filled 2022-02-21: qty 8

## 2022-02-21 MED ORDER — ETOMIDATE 2 MG/ML IV SOLN
20.0000 mg | Freq: Once | INTRAVENOUS | Status: AC
  Administered 2022-02-21: 20 mg via INTRAVENOUS

## 2022-02-21 MED ORDER — DEXTROSE 50 % IV SOLN
INTRAVENOUS | Status: AC
  Filled 2022-02-21: qty 50

## 2022-02-21 MED ORDER — PHENYLEPHRINE HCL-NACL 20-0.9 MG/250ML-% IV SOLN
0.0000 ug/min | INTRAVENOUS | Status: DC
  Administered 2022-02-21: 400 ug/min via INTRAVENOUS
  Administered 2022-02-21: 20 ug/min via INTRAVENOUS
  Administered 2022-02-21: 170 ug/min via INTRAVENOUS
  Administered 2022-02-21 (×3): 400 ug/min via INTRAVENOUS
  Filled 2022-02-21: qty 250
  Filled 2022-02-21: qty 750
  Filled 2022-02-21 (×3): qty 250

## 2022-02-21 MED ORDER — DEXTROSE 50 % IV SOLN
INTRAVENOUS | Status: AC
Start: 2022-02-21 — End: 2022-02-21
  Administered 2022-02-21: 50 mL
  Filled 2022-02-21: qty 100

## 2022-02-21 MED ORDER — PHENYLEPHRINE 40 MCG/ML (10ML) SYRINGE FOR IV PUSH (FOR BLOOD PRESSURE SUPPORT)
80.0000 ug | PREFILLED_SYRINGE | Freq: Once | INTRAVENOUS | Status: AC | PRN
  Administered 2022-02-21: 400 ug via INTRAVENOUS

## 2022-02-21 MED ORDER — ETOMIDATE 2 MG/ML IV SOLN
INTRAVENOUS | Status: AC
  Filled 2022-02-21: qty 20

## 2022-02-21 MED ORDER — VANCOMYCIN HCL 750 MG/150ML IV SOLN
750.0000 mg | INTRAVENOUS | Status: DC
  Filled 2022-02-21: qty 150

## 2022-02-21 NOTE — Progress Notes (Addendum)
This RN called and spoke with nephrologist, Dr. Jonnie Finner, to alert him that the patient's blood pressure was 50/30 while on 4 different pressors.  Was told that we should go ahead and stop CRRT and that they would reevaluate the course of therapy.  Blood was returned to the patient and blood pressure did in crase.  Currently 99/54 (65) by arterial line. CCM MD agarwala and PA JD Rollene Rotunda were alerted to this also.

## 2022-02-21 NOTE — Progress Notes (Signed)
Pt is at max dose of levo, neo, vaso and epi. BP is 69/40 (49). Elink MD [facility] aware, no interventions at this time. Dr. Prudencio Burly called daughter, [name], to address code status. Will remain a full code at this time. RN will continue to monitor.
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
## 2022-02-21 NOTE — Progress Notes (Signed)
Daughter would like patient to be a DNR. Orders placed by Dr. Prudencio Burly

## 2022-02-21 NOTE — Progress Notes (Signed)
Pharmacy Antibiotic Note  Jose Robinson is a 66 y.o. male admitted on 02/13/2022 with sepsis.  Pharmacy has been consulted for vancomycin and cefepime dosing. Patient is ESRD on iHD TTS - last iHD on 2/21. Tamx 97.5.   Now to start CRRT, intubated and to continue broad spectrum abx with pending Cx  Plan: Vancomycin 1000mg  IV q 24h Cefepime 2g IV q 12h Monitor CRRT, Cx and clinical progression to narrow Vancomycin levels as needed  Height: 5\' 11"  (180.3 cm) Weight: 70 kg (154 lb 5.2 oz) IBW/kg (Calculated) : 75.3  Temp (24hrs), Avg:98.3 F (36.8 C), Min:97.7 F (36.5 C), Max:99 F (37.2 C)  Recent Labs  Lab 02/15/22 1225 02/16/22 0639 02/17/22 0909 02/18/22 0237 02/06/2022 1144 02/06/2022 1335 02/21/22 0421  WBC  --  4.2 4.2  --  4.1 4.1  --   CREATININE 9.49* 6.15*  --  7.10*  --  6.46* 6.80*  LATICACIDVEN  --   --   --   --   --  1.7  --      Estimated Creatinine Clearance: 11 mL/min (A) (by C-G formula based on SCr of 6.8 mg/dL (H)).    Allergies  Allergen Reactions   Acetaminophen Palpitations   Prednisone Other (See Comments)    Severe hallucinations/paranoid delusions after steroid premedications for contrast allergy, required IV haldol and restraints.    Ivp Dye [Iodinated Contrast Media] Hives    Antimicrobials this admission: Vancomycin 2/22 >>  Cefepime 2/22 >>  Ctx 2/23 x 1  Dose adjustments this admission:   Microbiology results: 2/22 bcx: ngtd  Bertis Ruddy, PharmD Clinical Pharmacist ED Pharmacist Phone # [phone] 02/21/2022 12:16 PM

## 2022-02-21 NOTE — Procedures (Signed)
Central Venous Catheter Insertion Procedure Note  Jose Robinson  [id number]  04-07-1956  Date:02/21/22  Time:11:30 AM   Provider Performing:Rhys Anchondo D Rollene Rotunda   Procedure: Insertion of Non-tunneled Central Venous Catheter(36556)with US guidance (36144)    Indication(s) Hemodialysis  Consent Risks of the procedure as well as the alternatives and risks of each were explained to the patient and/or caregiver.  Consent for the procedure was obtained and is signed in the bedside chart  Anesthesia Topical only with 1% lidocaine   Timeout Verified patient identification, verified procedure, site/side was marked, verified correct patient position, special equipment/implants available, medications/allergies/relevant history reviewed, required imaging and test results available.  Sterile Technique Maximal sterile technique including full sterile barrier drape, hand hygiene, sterile gown, sterile gloves, mask, hair covering, sterile ultrasound probe cover (if used).  Procedure Description Area of catheter insertion was cleaned with chlorhexidine and draped in sterile fashion.   With real-time ultrasound guidance a HD catheter was placed into the right femoral vein.  Nonpulsatile blood flow and easy flushing noted in all ports.  The catheter was sutured in place and sterile dressing applied.     Complications/Tolerance None; patient tolerated the procedure well. Chest X-ray is ordered to verify placement for internal jugular or subclavian cannulation.  Chest x-ray is not ordered for femoral cannulation.  EBL Minimal  Specimen(s) None  JD Rexene Agent [facility] 02/21/2022, 11:30 AM  Please see [email or web address] for pager details.  From 7A-7P if no response, please call [phone]. After hours, please call ELink [phone].

## 2022-02-21 NOTE — Procedures (Deleted)
Arterial Catheter Insertion Procedure Note  CROCKETT RALLO  [id number]  10-Nov-1956  Date:02/21/22  Time:11:23 AM    Provider Performing: Joellen Jersey Zakkiyya Barno    Procedure: Insertion of Arterial Line [phone]) with US guidance (18841)   Indication(s) Blood pressure monitoring and/or need for frequent ABGs  Consent Risks of the procedure as well as the alternatives and risks of each were explained to the patient and/or caregiver.  Consent for the procedure was obtained and is signed in the bedside chart  Anesthesia Lidocaine 1%   Time Out Verified patient identification, verified procedure, site/side was marked, verified correct patient position, special equipment/implants available, medications/allergies/relevant history reviewed, required imaging and test results available.   Sterile Technique Maximal sterile technique including full sterile barrier drape, hand hygiene, sterile gown, sterile gloves, mask, hair covering, sterile ultrasound probe cover (if used).   Procedure Description Area of catheter insertion was cleaned with chlorhexidine and draped in sterile fashion. With real-time ultrasound guidance an arterial catheter was placed into the right femoral artery.  Appropriate arterial tracings confirmed on monitor.     Complications/Tolerance None; patient tolerated the procedure well.   EBL Minimal   Specimen(s) None

## 2022-02-21 NOTE — Procedures (Signed)
Intubation Procedure Note  Jose Robinson  [id number]  April 09, 1956  Date:02/21/22  Time:12:15 PM   Provider Performing:Cavin Longman    Procedure: Intubation (31500)  Indication(s) Respiratory Failure  Consent Risks of the procedure as well as the alternatives and risks of each were explained to the patient and/or caregiver.  Consent for the procedure was obtained and is signed in the bedside chart   Anesthesia Etomidate, Versed, Fentanyl, and Rocuronium   Time Out Verified patient identification, verified procedure, site/side was marked, verified correct patient position, special equipment/implants available, medications/allergies/relevant history reviewed, required imaging and test results available.   Sterile Technique Usual hand hygeine, masks, and gloves were used   Procedure Description Patient positioned in bed supine.  Sedation given as noted above.  Patient was intubated with endotracheal tube using  Mac .  View was Grade 1 full glottis .  Number of attempts was 1.  Colorimetric CO2 detector was consistent with tracheal placement.   Complications/Tolerance None; patient tolerated the procedure well. Chest X-ray is ordered to verify placement.   EBL Minimal   Specimen(s) None

## 2022-02-21 NOTE — Procedures (Signed)
Central Venous Catheter Insertion Procedure Note  ANDRAS GRUNEWALD  [id number]  01-05-1956  Date:02/21/22  Time:3:14 AM   Provider Performing:Sherryn Pollino Shearon Stalls   Procedure: Insertion of Non-tunneled Central Venous Catheter(36556) with US guidance ([id number])   Indication(s) Medication administration and Difficult access  Consent Unable to obtain consent due to emergent nature of procedure.  Anesthesia Topical only with 1% lidocaine   Timeout Verified patient identification, verified procedure, site/side was marked, verified correct patient position, special equipment/implants available, medications/allergies/relevant history reviewed, required imaging and test results available.  Sterile Technique Maximal sterile technique including full sterile barrier drape, hand hygiene, sterile gown, sterile gloves, mask, hair covering, sterile ultrasound probe cover (if used).  Procedure Description Area of catheter insertion was cleaned with chlorhexidine and draped in sterile fashion.  With real-time ultrasound guidance a central venous catheter was placed into the left femoral vein. Nonpulsatile blood flow and easy flushing noted in all ports.  The catheter was sutured in place and sterile dressing applied.  Complications/Tolerance None; patient tolerated the procedure well. Chest X-ray is ordered to verify placement for internal jugular or subclavian cannulation.   Chest x-ray is not ordered for femoral cannulation.  EBL Minimal  Specimen(s) None   Montey Hora, PA - [facility] For pager details, please see AMION or use Epic chat  After 1900, please call [place] for cross coverage needs 02/21/2022, 3:15 AM

## 2022-02-21 NOTE — Procedures (Signed)
Interventional Radiology Procedure Note  Procedure:   Bedside removal of left subclavian tunneled HD.  Discussed with Dr. Lynetta Mare  Hemostasis with manual pressure.  Indication: sepsis.   Complications: None Culture of the tip.  Recommendations:  - Routine wound care - Do not submerge for 7 days    Signed,  Dulcy Fanny. Earleen Newport, DO

## 2022-02-21 NOTE — Progress Notes (Signed)
Alerted Dr. Lynetta Mare and PA JD Rollene Rotunda that the patient's Artline blood pressure is reading 90/50 (60).  Despite the vasopressin at .05 and levophed at 9mcg, which is above their max rates.  Was given a verbal order that I could go up on the levophed to 60 if need be.  Dr. Lynetta Mare expressed that this patient was on max therapy and that he would call the daughter and let her know.  This RN will continue to observe and act accordingly.

## 2022-02-21 NOTE — Procedures (Signed)
Arterial Catheter Insertion Procedure Note  MERLON ALCORTA  [id number]  01/22/1956  Date:02/21/22  Time:11:31 AM    Provider Performing: Mick Sell    Procedure: Insertion of Arterial Line [phone]) with US guidance ([id number])   Indication(s) Blood pressure monitoring and/or need for frequent ABGs  Consent Risks of the procedure as well as the alternatives and risks of each were explained to the patient and/or caregiver.  Consent for the procedure was obtained and is signed in the bedside chart  Anesthesia None   Time Out Verified patient identification, verified procedure, site/side was marked, verified correct patient position, special equipment/implants available, medications/allergies/relevant history reviewed, required imaging and test results available.   Sterile Technique Maximal sterile technique including full sterile barrier drape, hand hygiene, sterile gown, sterile gloves, mask, hair covering, sterile ultrasound probe cover (if used).   Procedure Description Area of catheter insertion was cleaned with chlorhexidine and draped in sterile fashion. With real-time ultrasound guidance an arterial catheter was placed into the right femoral artery.  Appropriate arterial tracings confirmed on monitor.     Complications/Tolerance None; patient tolerated the procedure well.   EBL Minimal   Specimen(s) None  JD Rexene Agent [facility] 02/21/2022, 11:31 AM  Please see [email or web address] for pager details.  From 7A-7P if no response, please call [phone]. After hours, please call ELink [phone].

## 2022-02-21 NOTE — Progress Notes (Signed)
eLink Physician-Brief Progress Note Patient Name: Jose Robinson DOB: 07-15-1956 MRN: [id number]   Date of Service  02/21/2022  HPI/Events of Note  Hypotension - BP = 75/47 with MAP = 58 and max dose of Norepinephrine via PIV.   eICU Interventions  Plan: Will request that PCCM ground team place central venous line. Bolus with 0.9 NaCl 500 mL IV over 1 hour now.      Intervention Category Major Interventions: Hypotension - evaluation and management  Jewelz Ricklefs [place] 02/21/2022, 2:24 AM

## 2022-02-21 NOTE — Progress Notes (Signed)
RT note. RT attempt x2 to place aline without any success. Hematoma on Lt radial noted. RT will continue to monitor.

## 2022-02-21 NOTE — Progress Notes (Signed)
RT note. RN aware of ABG results, RN to tell MD

## 2022-02-21 NOTE — Progress Notes (Signed)
Initial Nutrition Assessment  DOCUMENTATION CODES:   Not applicable  INTERVENTION:   If appropriate recommend  Vital 1.5 @ 20 ml/hr  As appropriate advance by 10 ml every 12 hours to goal rate of 50 ml/hr  45 ml ProSource TF BID  Provides: 1880 kcal, 103 grams protein, and 912 ml free water.    NUTRITION DIAGNOSIS:   Inadequate oral intake related to inability to eat as evidenced by NPO status.  GOAL:   Patient will meet greater than or equal to 90% of their needs  MONITOR:   I & O's  REASON FOR ASSESSMENT:   Ventilator    ASSESSMENT:   Pt with PMH of ESRD on HD, HTN, PVD s/p R BKA, DM admitted with SNF admitted with acute metabolic encephalopathy and shock.    Pt maxed on pressors. MD contacting family. No plans for feeding at this time.  Staring CRRT.  EDW: 64.7 kg  Patient is currently intubated on ventilator support Temp (24hrs), Avg:98.3 F (36.8 C), Min:97.7 F (36.5 C), Max:99 F (37.2 C)  Medications reviewed and include: colace, SSI, miralax  Fentanyl  Levophed @ 60 mcg  Vasopressin @ .05 units   Labs reviewed: Na 130, K: 6.1 CBG's: 14-155  Diet Order:   Diet Order             Diet NPO time specified  Diet effective now                   EDUCATION NEEDS:      Skin:  Skin Assessment:  (non-pressure wound R knee, L DM finger)  Last BM:  2/22  Height:   Ht Readings from Last 1 Encounters:  02/21/2022 5\' 11"  (1.803 m)    Weight:   Wt Readings from Last 1 Encounters:  02/21/22 70 kg    BMI:  Body mass index is 21.52 kg/m.  Estimated Nutritional Needs:   Kcal:  1800-2000  Protein:  100-115 grams  Fluid:  >1.2 L/day  Lockie Pares., RD, LDN, CNSC See AMiON for contact information

## 2022-02-21 NOTE — Progress Notes (Signed)
Patient requiring more pressor support with MAP remaining in 40s.  Called and updated daughter, Jose Robinson about patient. She states that she is currently out of the state and that patient does not have other family members locally.  She states that she wants full scope of care.
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
## 2022-02-21 NOTE — Progress Notes (Addendum)
eLink Physician-Brief Progress Note Patient Name: Jose Robinson DOB: April 18, 1956 MRN: [id number]   Date of Service  02/21/2022  HPI/Events of Note  Hand off: might not survive overnight.  Notes, labs, meds reviewed.  Camera evaluation done.  Discussed with RN.  actively dying from Septic shock. on 4 pressors. CRRT on hold for severe hypotension. I do not see any source of infection for septic shock, suspecting Line sepsis, line is out. Gastroenteritis. No hemorregae-Hg stable. Recent fall, right eye globe rupture-C1#( autonomic dysfunction?),  any way, too sick for any further CT abdomen/CTA chest or any surgery if has ischemic bowel.   eICU Interventions  - updated CCM ground team.      Intervention Category Intermediate Interventions: Hypotension - evaluation and management  Jose Robinson 02/21/2022, 7:25 PM  20:17 BP 69/40 (49), HR 127...maxed on 4 pressors, CRRT off because of low BPs.  RN spoke with daughter who left for vacation today & has not plans to return home for father's condition.    Discussed with RN. Persisting severe AGMA, in multiorgan failure.  Jose Robinson, daughter over phone and discussed critical condition and very high chance of dying tonight. She is going to to talk to her fiancee/family and get back to [place] regarding further care plan/code status. - give stat Sodium bicarb and increase gtt to 200 ml/hr.  21:12 Jose Robinson called back. Wished him to be DNR.  Ordered and notified same to ground CCM team  01:05 ABG...6.95/19.4/127/4.3, sat 96% Vent...20/600 60% 5P  Continue care.   4:05 Expired is notified by bed side RN. RIP.

## 2022-02-22 LAB — POCT I-STAT 7, (LYTES, BLD GAS, ICA,H+H)
Acid-base deficit: 27 mmol/L — ABNORMAL HIGH (ref 0.0–2.0)
Bicarbonate: 4.3 mmol/L — ABNORMAL LOW (ref 20.0–28.0)
Calcium, Ion: 0.86 mmol/L — CL (ref 1.15–1.40)
HCT: 41 % (ref 39.0–52.0)
Hemoglobin: 13.9 g/dL (ref 13.0–17.0)
O2 Saturation: 96 %
Potassium: 6.4 mmol/L (ref 3.5–5.1)
Sodium: 134 mmol/L — ABNORMAL LOW (ref 135–145)
TCO2: <5 mmol/L — ABNORMAL LOW (ref 22–32)
pCO2 arterial: 19.4 mmHg — CL (ref 32–48)
pH, Arterial: 6.95 — CL (ref 7.35–7.45)
pO2, Arterial: 127 mmHg — ABNORMAL HIGH (ref 83–108)

## 2022-02-22 LAB — GLUCOSE, CAPILLARY
Glucose-Capillary: <10 mg/dL — CL (ref 70–99)
Glucose-Capillary: 123 mg/dL — ABNORMAL HIGH (ref 70–99)

## 2022-02-22 MED ORDER — DEXTROSE 50 % IV SOLN
25.0000 g | INTRAVENOUS | Status: AC
  Administered 2022-02-22: 25 g via INTRAVENOUS

## 2022-02-24 LAB — CATH TIP CULTURE: Culture: NO GROWTH

## 2022-02-26 LAB — CULTURE, BLOOD (ROUTINE X 2)
Culture: NO GROWTH
Culture: NO GROWTH
Special Requests: ADEQUATE

## 2022-02-27 NOTE — Progress Notes (Signed)
Cardiac time of death 61. MD made aware

## 2022-02-27 NOTE — Progress Notes (Signed)
Pt CBG 14. 1 amp d50 given. See MAR.  Recheck 114.

## 2022-02-27 NOTE — Progress Notes (Signed)
225 mL of fentanyl wasted with Jonette Eva, RN.

## 2022-02-27 DEATH — deceased

## 2022-03-22 ENCOUNTER — Encounter (HOSPITAL_COMMUNITY): Payer: Medicare (Managed Care)

## 2022-03-30 NOTE — Progress Notes (Signed)
°  Progress Note   Date: 03/14/2022  Patient Name: Jose Robinson        MRN#: [id number]  Review the patients clinical findings supports the diagnosis of:   Type 2 DM with Hypoglycemia
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
## 2022-07-25 IMAGING — DX DG CHEST 1V
1 series · 1 of 1 positions shown · non-contrast
Comparison: 01/23/2021

CLINICAL DATA: Diabetic ketoacidosis

EXAM:
CHEST  1 VIEW

[chest ap]
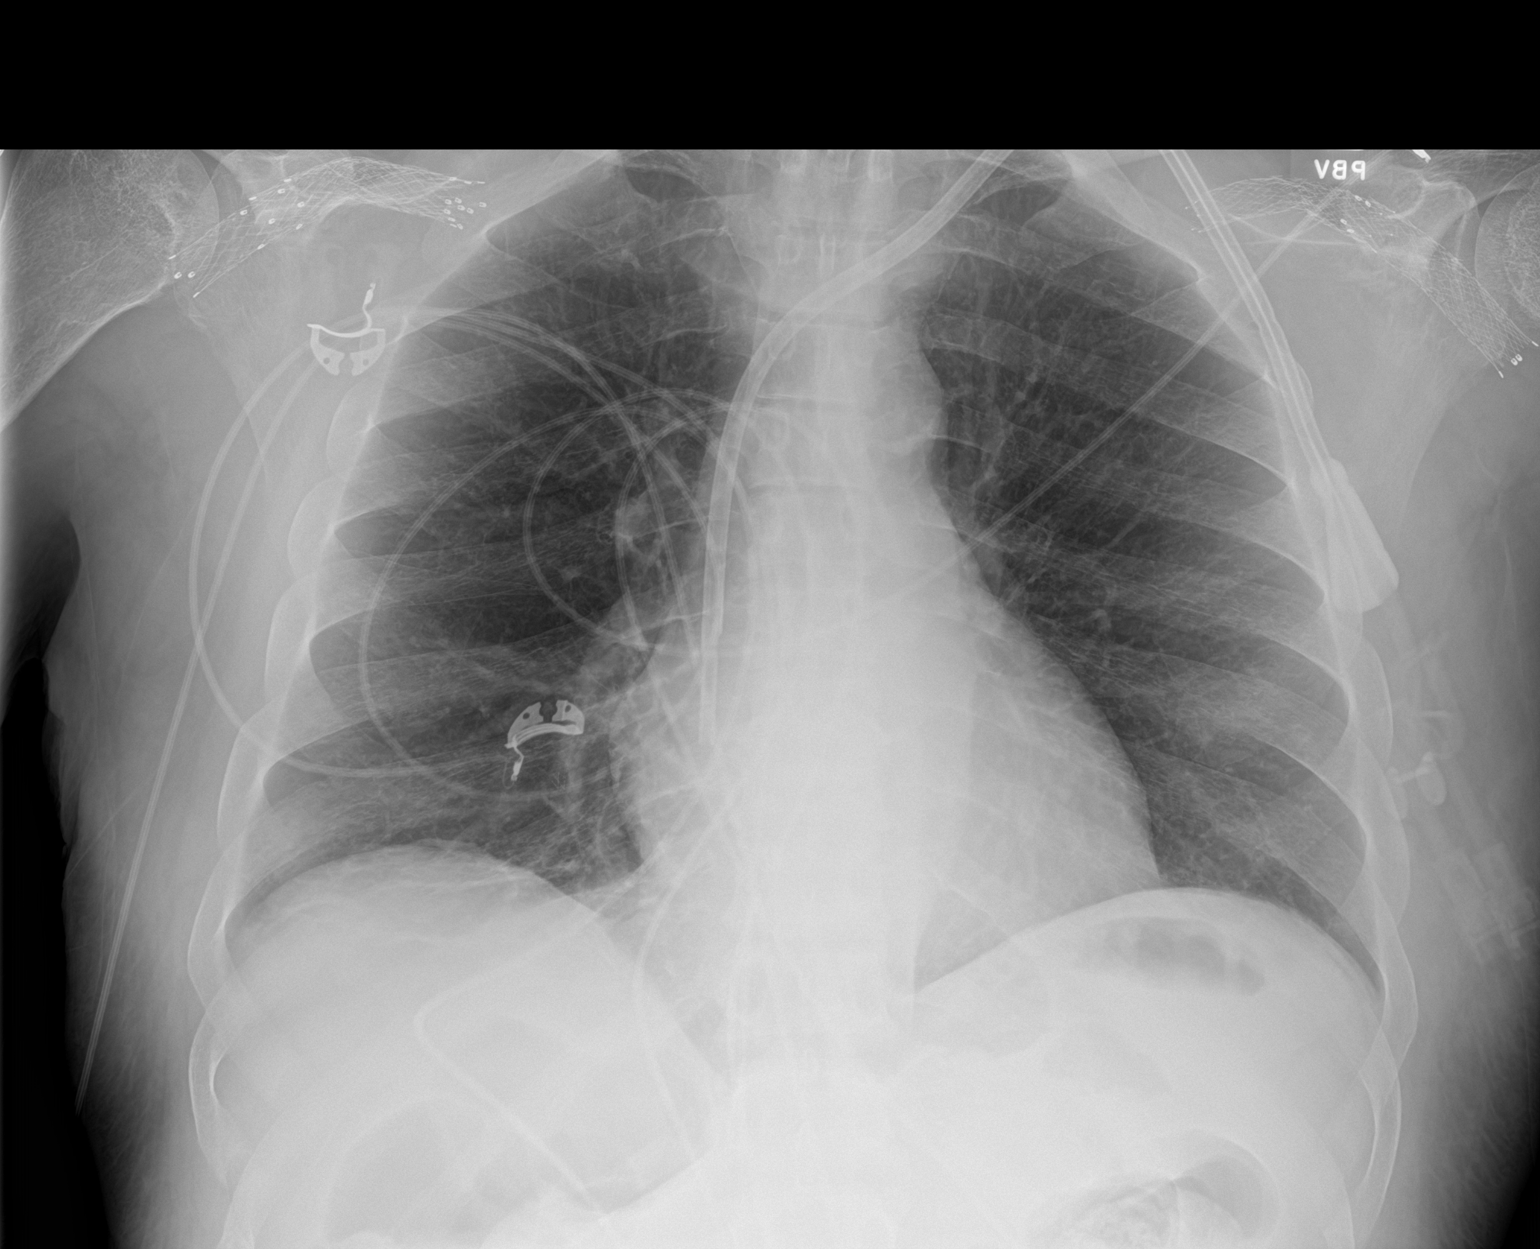

[1 of 1 positions shown; findings below may reference images not displayed]

FINDINGS: Lungs are clear. No pneumothorax or pleural effusion. Cardiac size
within normal limits. Left internal jugular hemodialysis catheter
tips within the superior right atrium. Vascular stents are noted
within the axilla bilaterally, likely within the axillary veins.
Cervical fusion hardware is partially visualized.
IMPRESSION: No active disease.
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
